# Patient Record
Sex: Female | Born: 1975 | Hispanic: Yes | Marital: Married | Smoking: Current some day smoker
Health system: Southern US, Community
[De-identification: ages and names within clinical notes are randomized; demographics above are authoritative.]

## PROBLEM LIST (undated history)

## (undated) DIAGNOSIS — T8859XA Other complications of anesthesia, initial encounter: Secondary | ICD-10-CM

## (undated) DIAGNOSIS — M5416 Radiculopathy, lumbar region: Secondary | ICD-10-CM

## (undated) DIAGNOSIS — D649 Anemia, unspecified: Secondary | ICD-10-CM

## (undated) DIAGNOSIS — J189 Pneumonia, unspecified organism: Secondary | ICD-10-CM

## (undated) DIAGNOSIS — K219 Gastro-esophageal reflux disease without esophagitis: Secondary | ICD-10-CM

## (undated) DIAGNOSIS — G4733 Obstructive sleep apnea (adult) (pediatric): Secondary | ICD-10-CM

## (undated) DIAGNOSIS — F419 Anxiety disorder, unspecified: Secondary | ICD-10-CM

## (undated) DIAGNOSIS — R079 Chest pain, unspecified: Secondary | ICD-10-CM

## (undated) DIAGNOSIS — K449 Diaphragmatic hernia without obstruction or gangrene: Secondary | ICD-10-CM

## (undated) DIAGNOSIS — M961 Postlaminectomy syndrome, not elsewhere classified: Secondary | ICD-10-CM

## (undated) DIAGNOSIS — M797 Fibromyalgia: Secondary | ICD-10-CM

## (undated) DIAGNOSIS — I209 Angina pectoris, unspecified: Secondary | ICD-10-CM

## (undated) DIAGNOSIS — D171 Benign lipomatous neoplasm of skin and subcutaneous tissue of trunk: Secondary | ICD-10-CM

## (undated) DIAGNOSIS — G894 Chronic pain syndrome: Secondary | ICD-10-CM

## (undated) DIAGNOSIS — J45909 Unspecified asthma, uncomplicated: Secondary | ICD-10-CM

## (undated) DIAGNOSIS — M1990 Unspecified osteoarthritis, unspecified site: Secondary | ICD-10-CM

## (undated) DIAGNOSIS — M5126 Other intervertebral disc displacement, lumbar region: Secondary | ICD-10-CM

## (undated) DIAGNOSIS — E669 Obesity, unspecified: Secondary | ICD-10-CM

## (undated) HISTORY — PX: ABDOMINAL HYSTERECTOMY: SHX81

## (undated) HISTORY — DX: Fibromyalgia: M79.7

## (undated) HISTORY — PX: CHOLECYSTECTOMY: SHX55

## (undated) HISTORY — PX: APPENDECTOMY: SHX54

## (undated) HISTORY — PX: ORIF FINGER / THUMB FRACTURE: SUR932

## (undated) HISTORY — PX: ANKLE FRACTURE SURGERY: SHX122

## (undated) HISTORY — PX: EAR TUBE REMOVAL: SHX1486

## (undated) HISTORY — PX: BACK SURGERY: SHX140

---

## 2003-04-28 DIAGNOSIS — Z9884 Bariatric surgery status: Secondary | ICD-10-CM

## 2003-04-28 HISTORY — DX: Bariatric surgery status: Z98.84

## 2004-04-27 DIAGNOSIS — Z9884 Bariatric surgery status: Secondary | ICD-10-CM

## 2004-04-27 HISTORY — DX: Bariatric surgery status: Z98.84

## 2004-04-27 HISTORY — PX: GASTRIC BYPASS: SHX52

## 2016-06-17 DIAGNOSIS — R51 Headache: Secondary | ICD-10-CM | POA: Diagnosis not present

## 2016-06-17 DIAGNOSIS — M797 Fibromyalgia: Secondary | ICD-10-CM | POA: Diagnosis not present

## 2016-07-03 ENCOUNTER — Ambulatory Visit (INDEPENDENT_AMBULATORY_CARE_PROVIDER_SITE_OTHER): Payer: BLUE CROSS/BLUE SHIELD | Admitting: Family Medicine

## 2016-07-03 ENCOUNTER — Encounter (INDEPENDENT_AMBULATORY_CARE_PROVIDER_SITE_OTHER): Payer: Self-pay

## 2016-07-03 ENCOUNTER — Encounter: Payer: Self-pay | Admitting: Family Medicine

## 2016-07-03 VITALS — BP 120/70 | HR 82 | Ht 66.0 in | Wt 218.0 lb

## 2016-07-03 DIAGNOSIS — R5383 Other fatigue: Secondary | ICD-10-CM

## 2016-07-03 DIAGNOSIS — R5381 Other malaise: Secondary | ICD-10-CM

## 2016-07-03 DIAGNOSIS — Z7689 Persons encountering health services in other specified circumstances: Secondary | ICD-10-CM

## 2016-07-03 DIAGNOSIS — M797 Fibromyalgia: Secondary | ICD-10-CM | POA: Diagnosis not present

## 2016-07-03 MED ORDER — DULOXETINE HCL 60 MG PO CPEP: 60.0000 mg | ORAL_CAPSULE | Freq: Every day | ORAL | 3 refills | Status: DC

## 2016-07-03 NOTE — Progress Notes (Signed)
Name: Margaret Cuevas   MRN: [id number]    DOB: [birth year]   Date:07/03/2016       Progress Note  Subjective  Chief Complaint  Chief Complaint  Patient presents with  . Establish Care  . Fibromyalgia    takes Duloxetine 30mg  for this- wants the dose increased- has never seen a specialist for fibromyalgia  . Fatigue    "having to take "an energy shot daily to get through the day"    Patient presents for establishment for medical care.   Muscle Pain  This is a chronic problem. The current episode started more than 1 year ago. The problem occurs daily. The problem has been gradually worsening since onset. The pain occurs in the context of recent emotional stress ("whole lifestyle change"). Pain location: multiple trigger points. The pain is medium. Associated symptoms include headaches. Pertinent negatives include no abdominal pain, chest pain, constipation, diarrhea, dysuria, eye pain, fatigue, fever, nausea, rash, stiffness, sensory change, shortness of breath, swollen glands, urinary symptoms, visual change, vomiting or wheezing. The treatment provided moderate relief.  Thyroid Problem  Presents for follow-up visit. Symptoms include palpitations and weight gain. Patient reports no anxiety, cold intolerance, constipation, depressed mood, diaphoresis, diarrhea, dry skin, fatigue, hair loss, heat intolerance, hoarse voice, leg swelling, menstrual problem, nail problem, tremors, visual change or weight loss. The symptoms have been worsening.    No problem-specific Assessment & Plan notes found for this encounter.   Past Medical History:  Diagnosis Date  . Fibromyalgia     Past Surgical History:  Procedure Laterality Date  . ABDOMINAL HYSTERECTOMY    . ANKLE FRACTURE SURGERY     plates and screws placed on both sides  . BACK SURGERY    . CESAREAN SECTION    . CHOLECYSTECTOMY    . EAR TUBE REMOVAL    . GASTRIC BYPASS  2006  . ORIF FINGER / THUMB FRACTURE     screws in thumb     Family History  Problem Relation Age of Onset  . Diabetes Mother   . Cancer Maternal Grandmother   . Diabetes Maternal Grandmother   . Hypertension Maternal Grandmother   . Cancer Paternal Grandfather   . Hypertension Father   . Hypertension Paternal Grandmother     Social History   Social History  . Marital status: Single    Spouse name: N/A  . Number of children: N/A  . Years of education: N/A   Occupational History  . Not on file.   Social History Main Topics  . Smoking status: Current Some Day Smoker    Types: Cigarettes  . Smokeless tobacco: Never Used  . Alcohol use Yes  . Drug use: No  . Sexual activity: Yes   Other Topics Concern  . Not on file   Social History Narrative  . No narrative on file    Allergies  Allergen Reactions  . Ibuprofen Other (See Comments)    Other Reaction: Other reaction-swells    No outpatient prescriptions prior to visit.   No facility-administered medications prior to visit.     Review of Systems  Constitutional: Positive for malaise/fatigue and weight gain. Negative for chills, diaphoresis, fatigue, fever and weight loss.  HENT: Negative for ear discharge, ear pain, hoarse voice and sore throat.   Eyes: Negative for blurred vision and pain.  Respiratory: Negative for cough, sputum production, shortness of breath and wheezing.   Cardiovascular: Positive for palpitations. Negative for chest pain and leg swelling.  Gastrointestinal: Negative  for abdominal pain, blood in stool, constipation, diarrhea, heartburn, melena, nausea and vomiting.  Genitourinary: Negative for dysuria, frequency, hematuria, menstrual problem and urgency.  Musculoskeletal: Positive for back pain, joint pain, myalgias and neck pain. Negative for stiffness.  Skin: Negative for itching and rash.  Neurological: Positive for headaches. Negative for dizziness, tingling, tremors, sensory change and focal weakness.  Endo/Heme/Allergies: Negative for  environmental allergies, cold intolerance, heat intolerance and polydipsia. Does not bruise/bleed easily.  Psychiatric/Behavioral: Negative for depression and suicidal ideas. The patient is not nervous/anxious and does not have insomnia.      Objective  Vitals:   07/03/16 1457  BP: 120/70  Pulse: 82  Weight: 218 lb (98.9 kg)  Height: 5\' 6"  (1.676 m)    Physical Exam  Constitutional: She is well-developed, well-nourished, and in no distress. No distress.  HENT:  Head: Normocephalic and atraumatic.  Right Ear: External ear normal.  Left Ear: External ear normal.  Nose: Nose normal.  Mouth/Throat: Oropharynx is clear and moist.  Eyes: Conjunctivae and EOM are normal. Pupils are equal, round, and reactive to light. Right eye exhibits no discharge. Left eye exhibits no discharge.  Neck: Normal range of motion. Neck supple. No JVD present. No thyromegaly present.  Cardiovascular: Normal rate, regular rhythm, normal heart sounds and intact distal pulses.  Exam reveals no gallop and no friction rub.   No murmur heard. Pulmonary/Chest: Effort normal and breath sounds normal. She has no wheezes. She has no rales.  Abdominal: Soft. Bowel sounds are normal. She exhibits no mass. There is no tenderness. There is no guarding.  Musculoskeletal: Normal range of motion. She exhibits no edema.  Lymphadenopathy:    She has no cervical adenopathy.  Neurological: She is alert. She has normal reflexes.  Skin: Skin is warm and dry. She is not diaphoretic.  Psychiatric: Mood and affect normal.  Nursing note and vitals reviewed.     Assessment & Plan  Problem List Items Addressed This Visit    None    Visit Diagnoses    Encounter to establish care    -  Primary   Malaise and fatigue       Relevant Orders   TSH   CBC with Differential/Platelet   Renal Function Panel   Sedimentation rate   Fibromyalgia       Relevant Medications   DULoxetine (CYMBALTA) 60 MG capsule      Meds ordered  this encounter  Medications  . DULoxetine (CYMBALTA) 60 MG capsule    Sig: Take 1 capsule (60 mg total) by mouth daily.    Dispense:  30 capsule    Refill:  3      Dr. Otilio Miu [facility]  07/03/16

## 2016-07-03 NOTE — Patient Instructions (Signed)
Myofascial Pain Syndrome and Fibromyalgia Myofascial pain syndrome and fibromyalgia are both pain disorders. This pain may be felt mainly in your muscles.  Myofascial pain syndrome:  Always has trigger points or tender points in the muscle that will cause pain when pressed. The pain may come and go.  Usually affects your neck, upper back, and shoulder areas. The pain often radiates into your arms and hands.  Fibromyalgia:  Has muscle pains and tenderness that come and go.  Is often associated with fatigue and sleep disturbances.  Has trigger points.  Tends to be long-lasting (chronic), but is not life-threatening. Fibromyalgia and myofascial pain are not the same. However, they often occur together. If you have both conditions, each can make the other worse. Both are common and can cause enough pain and fatigue to make day-to-day activities difficult. What are the causes? The exact causes of fibromyalgia and myofascial pain are not known. People with certain gene types may be more likely to develop fibromyalgia. Some factors can be triggers for both conditions, such as:  Spine disorders.  Arthritis.  Severe injury (trauma) and other physical stressors.  Being under a lot of stress.  A medical illness. What are the signs or symptoms? Fibromyalgia  The main symptom of fibromyalgia is widespread pain and tenderness in your muscles. This can vary over time. Pain is sometimes described as stabbing, shooting, or burning. You may have tingling or numbness, too. You may also have sleep problems and fatigue. You may wake up feeling tired and groggy (fibro fog). Other symptoms may include:  Bowel and bladder problems.  Headaches.  Visual problems.  Problems with odors and noises.  Depression or mood changes.  Painful menstrual periods (dysmenorrhea).  Dry skin or eyes. Myofascial pain syndrome  Symptoms of myofascial pain syndrome include:  Tight, ropy bands of  muscle.  Uncomfortable sensations in muscular areas, such as:  Aching.  Cramping.  Burning.  Numbness.  Tingling.  Muscle weakness.  Trouble moving certain muscles freely (range of motion). How is this diagnosed? There are no specific tests to diagnose fibromyalgia or myofascial pain syndrome. Both can be hard to diagnose because their symptoms are common in many other conditions. Your health care provider may suspect one or both of these conditions based on your symptoms and medical history. Your health care provider will also do a physical exam. The key to diagnosing fibromyalgia is having pain, fatigue, and other symptoms for more than three months that cannot be explained by another condition. The key to diagnosing myofascial pain syndrome is finding trigger points in muscles that are tender and cause pain elsewhere in your body (referred pain). How is this treated? Treating fibromyalgia and myofascial pain often requires a team of health care providers. This usually starts with your primary provider and a physical therapist. You may also find it helpful to work with alternative health care providers, such as massage therapists or acupuncturists. Treatment for fibromyalgia may include medicines. This may include nonsteroidal anti-inflammatory drugs (NSAIDs), along with other medicines. Treatment for myofascial pain may also include:  NSAIDs.  Cooling and stretching of muscles.  Trigger point injections.  Sound wave (ultrasound) treatments to stimulate muscles. Follow these instructions at home:  Take medicines only as directed by your health care provider.  Exercise as directed by your health care provider or physical therapist.  Try to avoid stressful situations.  Practice relaxation techniques to control your stress. You may want to try:  Biofeedback.  Visual imagery.  Hypnosis.    Muscle relaxation.  Yoga.  Meditation.  Talk to your health care provider  about alternative treatments, such as acupuncture or massage treatment.  Maintain a healthy lifestyle. This includes eating a healthy diet and getting enough sleep.  Consider joining a support group.  Do not do activities that stress or strain your muscles. That includes repetitive motions and heavy lifting. Where to find more information:  National Fibromyalgia Association: www.fmaware.org  Arthritis Foundation: www.arthritis.org  American Chronic Pain Association: www.theacpa.org/condition/myofascial-pain Contact a health care provider if:  You have new symptoms.  Your symptoms get worse.  You have side effects from your medicines.  You have trouble sleeping.  Your condition is causing depression or anxiety. This information is not intended to replace advice given to you by your health care provider. Make sure you discuss any questions you have with your health care provider. Document Released: 04/13/2005 Document Revised: 09/19/2015 Document Reviewed: 01/17/2014 Elsevier Interactive Patient Education  2017 Elsevier Inc.  

## 2016-07-04 LAB — CBC WITH DIFFERENTIAL/PLATELET
BASOS: 0 %
Basophils Absolute: 0 10*3/uL (ref 0.0–0.2)
EOS (ABSOLUTE): 0.2 10*3/uL (ref 0.0–0.4)
Eos: 3 %
HEMOGLOBIN: 12 g/dL (ref 11.1–15.9)
Hematocrit: 36.1 % (ref 34.0–46.6)
IMMATURE GRANS (ABS): 0 10*3/uL (ref 0.0–0.1)
Immature Granulocytes: 0 %
LYMPHS ABS: 2.6 10*3/uL (ref 0.7–3.1)
LYMPHS: 41 %
MCH: 28.9 pg (ref 26.6–33.0)
MCHC: 33.2 g/dL (ref 31.5–35.7)
MCV: 87 fL (ref 79–97)
MONOCYTES: 7 %
Monocytes Absolute: 0.5 10*3/uL (ref 0.1–0.9)
NEUTROS ABS: 3 10*3/uL (ref 1.4–7.0)
Neutrophils: 49 %
Platelets: 270 10*3/uL (ref 150–379)
RBC: 4.15 x10E6/uL (ref 3.77–5.28)
RDW: 17.6 % — ABNORMAL HIGH (ref 12.3–15.4)
WBC: 6.2 10*3/uL (ref 3.4–10.8)

## 2016-07-04 LAB — RENAL FUNCTION PANEL
Albumin: 4.3 g/dL (ref 3.5–5.5)
BUN/Creatinine Ratio: 22 (ref 9–23)
BUN: 18 mg/dL (ref 6–20)
CO2: 25 mmol/L (ref 18–29)
CREATININE: 0.83 mg/dL (ref 0.57–1.00)
Calcium: 9.4 mg/dL (ref 8.7–10.2)
Chloride: 102 mmol/L (ref 96–106)
GFR, EST AFRICAN AMERICAN: 103 mL/min/{1.73_m2} (ref >59)
GFR, EST NON AFRICAN AMERICAN: 90 mL/min/{1.73_m2} (ref >59)
Glucose: 91 mg/dL (ref 65–99)
PHOSPHORUS: 4.3 mg/dL (ref 2.5–4.5)
Potassium: 4.8 mmol/L (ref 3.5–5.2)
Sodium: 142 mmol/L (ref 134–144)

## 2016-07-04 LAB — TSH: TSH: 1.48 u[IU]/mL (ref 0.450–4.500)

## 2016-07-04 LAB — SEDIMENTATION RATE: Sed Rate: 5 mm/hr (ref 0–32)

## 2016-09-11 ENCOUNTER — Other Ambulatory Visit: Payer: Self-pay

## 2016-11-17 ENCOUNTER — Other Ambulatory Visit: Payer: Self-pay

## 2016-11-17 DIAGNOSIS — M797 Fibromyalgia: Secondary | ICD-10-CM

## 2016-11-17 MED ORDER — DULOXETINE HCL 60 MG PO CPEP: 60.0000 mg | ORAL_CAPSULE | Freq: Every day | ORAL | 0 refills | Status: DC

## 2016-12-01 ENCOUNTER — Encounter: Payer: Self-pay | Admitting: Family Medicine

## 2016-12-01 ENCOUNTER — Ambulatory Visit (INDEPENDENT_AMBULATORY_CARE_PROVIDER_SITE_OTHER): Payer: BLUE CROSS/BLUE SHIELD | Admitting: Family Medicine

## 2016-12-01 VITALS — BP 100/64 | HR 64 | Ht 66.0 in | Wt 221.0 lb

## 2016-12-01 DIAGNOSIS — M7712 Lateral epicondylitis, left elbow: Secondary | ICD-10-CM | POA: Diagnosis not present

## 2016-12-01 DIAGNOSIS — E663 Overweight: Secondary | ICD-10-CM

## 2016-12-01 DIAGNOSIS — E8941 Symptomatic postprocedural ovarian failure: Secondary | ICD-10-CM | POA: Diagnosis not present

## 2016-12-01 DIAGNOSIS — M797 Fibromyalgia: Secondary | ICD-10-CM | POA: Diagnosis not present

## 2016-12-01 MED ORDER — DULOXETINE HCL 60 MG PO CPEP: 60.0000 mg | ORAL_CAPSULE | Freq: Every day | ORAL | 0 refills | Status: DC

## 2016-12-01 MED ORDER — GABAPENTIN 100 MG PO CAPS: 100.0000 mg | ORAL_CAPSULE | Freq: Three times a day (TID) | ORAL | 3 refills | Status: DC

## 2016-12-01 MED ORDER — ESTRADIOL 0.0375 MG/24HR TD PTWK: 0.0375 mg | MEDICATED_PATCH | TRANSDERMAL | 12 refills | Status: DC

## 2016-12-01 NOTE — Progress Notes (Signed)
Name: Margaret Cuevas   MRN: [id number]    DOB: [birth year]   Date:12/01/2016       Progress Note  Subjective  Chief Complaint  Chief Complaint  Patient presents with  . Follow-up    increased med last visit  . Elbow Pain    L) elbow pain    Muscle Pain  This is a chronic problem. The current episode started more than 1 year ago. The problem occurs constantly. The problem has been waxing and waning since onset. The context of the pain is unknown. Pain location: generalized. The pain is mild. Nothing aggravates the symptoms. Associated symptoms include fatigue and stiffness. Pertinent negatives include no abdominal pain, chest pain, constipation, diarrhea, dysuria, eye pain, fever, headaches, joint swelling, nausea, rash, sensory change, shortness of breath, swollen glands, urinary symptoms, vaginal discharge, visual change, vomiting, weakness or wheezing. The treatment provided mild relief. There is no swelling present.    No problem-specific Assessment & Plan notes found for this encounter.   Past Medical History:  Diagnosis Date  . Fibromyalgia     Past Surgical History:  Procedure Laterality Date  . ABDOMINAL HYSTERECTOMY    . ANKLE FRACTURE SURGERY     plates and screws placed on both sides  . BACK SURGERY    . CESAREAN SECTION    . CHOLECYSTECTOMY    . EAR TUBE REMOVAL    . GASTRIC BYPASS  2006  . ORIF FINGER / THUMB FRACTURE     screws in thumb    Family History  Problem Relation Age of Onset  . Diabetes Mother   . Cancer Maternal Grandmother   . Diabetes Maternal Grandmother   . Hypertension Maternal Grandmother   . Cancer Paternal Grandfather   . Hypertension Father   . Hypertension Paternal Grandmother     Social History   Social History  . Marital status: Single    Spouse name: N/A  . Number of children: N/A  . Years of education: N/A   Occupational History  . Not on file.   Social History Main Topics  . Smoking status: Current Some Day Smoker   Types: Cigarettes  . Smokeless tobacco: Never Used  . Alcohol use Yes  . Drug use: No  . Sexual activity: Yes   Other Topics Concern  . Not on file   Social History Narrative  . No narrative on file    Allergies  Allergen Reactions  . Ibuprofen Other (See Comments)    Other Reaction: Other reaction-swells    Outpatient Medications Prior to Visit  Medication Sig Dispense Refill  . DULoxetine (CYMBALTA) 60 MG capsule Take 1 capsule (60 mg total) by mouth daily. 30 capsule 0  . BLACK COHOSH PO Take 1 capsule by mouth daily.     No facility-administered medications prior to visit.     Review of Systems  Constitutional: Positive for fatigue. Negative for chills, fever, malaise/fatigue and weight loss.  HENT: Negative for ear discharge, ear pain and sore throat.   Eyes: Negative for blurred vision and pain.  Respiratory: Negative for cough, sputum production, shortness of breath and wheezing.   Cardiovascular: Negative for chest pain, palpitations and leg swelling.  Gastrointestinal: Negative for abdominal pain, blood in stool, constipation, diarrhea, heartburn, melena, nausea and vomiting.  Genitourinary: Negative for dysuria, frequency, hematuria, urgency and vaginal discharge.  Musculoskeletal: Positive for stiffness. Negative for back pain, joint pain, joint swelling, myalgias and neck pain.  Skin: Negative for rash.  Neurological: Negative for  dizziness, tingling, sensory change, focal weakness, weakness and headaches.  Endo/Heme/Allergies: Negative for environmental allergies and polydipsia. Does not bruise/bleed easily.  Psychiatric/Behavioral: Negative for depression and suicidal ideas. The patient is not nervous/anxious and does not have insomnia.      Objective  Vitals:   12/01/16 0841  BP: 100/64  Pulse: 64  Weight: 221 lb (100.2 kg)  Height: 5\' 6"  (1.676 m)    Physical Exam  Constitutional: She is well-developed, well-nourished, and in no distress. No  distress.  HENT:  Head: Normocephalic and atraumatic.  Right Ear: External ear normal.  Left Ear: External ear normal.  Nose: Nose normal.  Mouth/Throat: Oropharynx is clear and moist.  Eyes: Pupils are equal, round, and reactive to light. Conjunctivae and EOM are normal. Right eye exhibits no discharge. Left eye exhibits no discharge.  Neck: Normal range of motion. Neck supple. No JVD present. No thyromegaly present.  Cardiovascular: Normal rate, regular rhythm, normal heart sounds and intact distal pulses.  Exam reveals no gallop and no friction rub.   No murmur heard. Pulmonary/Chest: Effort normal and breath sounds normal. She has no wheezes. She has no rales.  Abdominal: Soft. Bowel sounds are normal. She exhibits no mass. There is no tenderness. There is no guarding.  Musculoskeletal: Normal range of motion. She exhibits no edema.       Left elbow: Tenderness found.  Lymphadenopathy:    She has no cervical adenopathy.  Neurological: She is alert. She has normal reflexes.  Skin: Skin is warm and dry. She is not diaphoretic.  Psychiatric: Mood and affect normal.  Nursing note and vitals reviewed.     Assessment & Plan  Problem List Items Addressed This Visit    None    Visit Diagnoses    Fibromyalgia    -  Primary   Relevant Medications   gabapentin (NEURONTIN) 100 MG capsule   DULoxetine (CYMBALTA) 60 MG capsule   Hot flashes due to surgical menopause       Relevant Medications   estradiol (CLIMARA - DOSED IN MG/24 HR) 0.0375 mg/24hr patch   Overweight       Epicondylitis, lateral, left       Relevant Orders   Ambulatory referral to Orthopedic Surgery      Meds ordered this encounter  Medications  . gabapentin (NEURONTIN) 100 MG capsule    Sig: Take 1 capsule (100 mg total) by mouth 3 (three) times daily.    Dispense:  90 capsule    Refill:  3  . estradiol (CLIMARA - DOSED IN MG/24 HR) 0.0375 mg/24hr patch    Sig: Place 1 patch (0.0375 mg total) onto the skin  once a week.    Dispense:  4 patch    Refill:  12  . DULoxetine (CYMBALTA) 60 MG capsule    Sig: Take 1 capsule (60 mg total) by mouth daily.    Dispense:  30 capsule    Refill:  0      Dr. Otilio Miu [facility]  12/01/16

## 2016-12-08 DIAGNOSIS — M7712 Lateral epicondylitis, left elbow: Secondary | ICD-10-CM | POA: Diagnosis not present

## 2016-12-08 DIAGNOSIS — M7702 Medial epicondylitis, left elbow: Secondary | ICD-10-CM | POA: Diagnosis not present

## 2017-01-18 ENCOUNTER — Telehealth: Payer: Self-pay | Admitting: Family Medicine

## 2017-01-18 NOTE — Telephone Encounter (Signed)
States she needs a stronger dose patch, having hot flashes and night sweats. Please Advise.

## 2017-01-20 ENCOUNTER — Encounter: Payer: Self-pay | Admitting: Family Medicine

## 2017-01-20 ENCOUNTER — Ambulatory Visit (INDEPENDENT_AMBULATORY_CARE_PROVIDER_SITE_OTHER): Payer: BLUE CROSS/BLUE SHIELD | Admitting: Family Medicine

## 2017-01-20 VITALS — BP 120/88 | HR 72 | Ht 66.0 in | Wt 230.0 lb

## 2017-01-20 DIAGNOSIS — M542 Cervicalgia: Secondary | ICD-10-CM | POA: Diagnosis not present

## 2017-01-20 DIAGNOSIS — G8929 Other chronic pain: Secondary | ICD-10-CM | POA: Diagnosis not present

## 2017-01-20 DIAGNOSIS — N951 Menopausal and female climacteric states: Secondary | ICD-10-CM

## 2017-01-20 DIAGNOSIS — M797 Fibromyalgia: Secondary | ICD-10-CM

## 2017-01-20 DIAGNOSIS — Z23 Encounter for immunization: Secondary | ICD-10-CM | POA: Diagnosis not present

## 2017-01-20 MED ORDER — DULOXETINE HCL 60 MG PO CPEP: 60.0000 mg | ORAL_CAPSULE | Freq: Every day | ORAL | 0 refills | Status: DC

## 2017-01-20 MED ORDER — ESTRADIOL 0.05 MG/24HR TD PTTW: 1.0000 | MEDICATED_PATCH | TRANSDERMAL | 12 refills | Status: DC

## 2017-01-20 MED ORDER — GABAPENTIN 100 MG PO CAPS: 100.0000 mg | ORAL_CAPSULE | Freq: Three times a day (TID) | ORAL | 3 refills | Status: DC

## 2017-01-20 NOTE — Progress Notes (Signed)
Name: Margaret Cuevas   MRN: [id number]    DOB: Jul 02, 1975   Date:01/20/2017       Progress Note  Subjective  Chief Complaint  Chief Complaint  Patient presents with  . hormone replacement therapy    use to use the vivelle patches- wants to switch to that  . Neck Pain    send to [facility] for further eval for neck pain    Patient on hormone replacement for vasomotor sx of hypoestrogen.   Neck Pain   This is a chronic problem. The current episode started more than 1 year ago. The problem occurs daily. The problem has been waxing and waning. The pain is associated with nothing. Pain location: posterior/bilateral. The quality of the pain is described as aching and shooting. The pain is at a severity of 7/10. The pain is moderate. Nothing aggravates the symptoms. The pain is worse during the night. Associated symptoms include headaches. Pertinent negatives include no chest pain, fever, leg pain, numbness, pain with swallowing, paresis, tingling, weakness or weight loss. Treatments tried: duloxetine. The treatment provided mild relief.    No problem-specific Assessment & Plan notes found for this encounter.   Past Medical History:  Diagnosis Date  . Fibromyalgia     Past Surgical History:  Procedure Laterality Date  . ABDOMINAL HYSTERECTOMY    . ANKLE FRACTURE SURGERY     plates and screws placed on both sides  . BACK SURGERY    . CESAREAN SECTION    . CHOLECYSTECTOMY    . EAR TUBE REMOVAL    . GASTRIC BYPASS  2006  . ORIF FINGER / THUMB FRACTURE     screws in thumb    Family History  Problem Relation Age of Onset  . Diabetes Mother   . Cancer Maternal Grandmother   . Diabetes Maternal Grandmother   . Hypertension Maternal Grandmother   . Cancer Paternal Grandfather   . Hypertension Father   . Hypertension Paternal Grandmother     Social History   Social History  . Marital status: Single    Spouse name: N/A  . Number of children: N/A  . Years of education: N/A    Occupational History  . Not on file.   Social History Main Topics  . Smoking status: Current Some Day Smoker    Types: Cigarettes  . Smokeless tobacco: Never Used  . Alcohol use Yes  . Drug use: No  . Sexual activity: Yes   Other Topics Concern  . Not on file   Social History Narrative  . No narrative on file    Allergies  Allergen Reactions  . Ibuprofen Other (See Comments)    Other Reaction: Other reaction-swells    Outpatient Medications Prior to Visit  Medication Sig Dispense Refill  . BLACK COHOSH PO Take 1 capsule by mouth daily.    . DULoxetine (CYMBALTA) 60 MG capsule Take 1 capsule (60 mg total) by mouth daily. 30 capsule 0  . estradiol (CLIMARA - DOSED IN MG/24 HR) 0.0375 mg/24hr patch Place 1 patch (0.0375 mg total) onto the skin once a week. 4 patch 12  . gabapentin (NEURONTIN) 100 MG capsule Take 1 capsule (100 mg total) by mouth 3 (three) times daily. (Patient taking differently: Take 100 mg by mouth 2 (two) times daily. ) 90 capsule 3   No facility-administered medications prior to visit.     Review of Systems  Constitutional: Negative for chills, fever, malaise/fatigue and weight loss.  HENT: Negative for ear discharge,  ear pain and sore throat.   Eyes: Negative for blurred vision.  Respiratory: Negative for cough, sputum production, shortness of breath and wheezing.   Cardiovascular: Negative for chest pain, palpitations and leg swelling.  Gastrointestinal: Negative for abdominal pain, blood in stool, constipation, diarrhea, heartburn, melena and nausea.  Genitourinary: Negative for dysuria, frequency, hematuria and urgency.  Musculoskeletal: Positive for neck pain. Negative for back pain, joint pain and myalgias.  Skin: Negative for rash.  Neurological: Positive for headaches. Negative for dizziness, tingling, sensory change, focal weakness, weakness and numbness.  Endo/Heme/Allergies: Negative for environmental allergies and polydipsia. Does not  bruise/bleed easily.  Psychiatric/Behavioral: Negative for depression and suicidal ideas. The patient is not nervous/anxious and does not have insomnia.      Objective  Vitals:   01/20/17 0842  BP: 120/88  Pulse: 72  Weight: 230 lb (104.3 kg)  Height: 5\' 6"  (1.676 m)    Physical Exam  Constitutional: She is well-developed, well-nourished, and in no distress. No distress.  HENT:  Head: Normocephalic and atraumatic.  Right Ear: External ear normal.  Left Ear: External ear normal.  Nose: Nose normal.  Mouth/Throat: Oropharynx is clear and moist.  Eyes: Pupils are equal, round, and reactive to light. Conjunctivae and EOM are normal. Right eye exhibits no discharge. Left eye exhibits no discharge.  Neck: Normal range of motion. Neck supple. No JVD present. No thyromegaly present.  Cardiovascular: Normal rate, regular rhythm, normal heart sounds and intact distal pulses.  Exam reveals no gallop and no friction rub.   No murmur heard. Pulmonary/Chest: Effort normal and breath sounds normal. She has no wheezes. She has no rales.  Abdominal: Soft. Bowel sounds are normal. She exhibits no mass. There is no tenderness. There is no guarding.  Musculoskeletal: Normal range of motion. She exhibits no edema.       Cervical back: She exhibits tenderness and spasm.  Lymphadenopathy:    She has no cervical adenopathy.  Neurological: She is alert.  Skin: Skin is warm and dry. She is not diaphoretic.  Psychiatric: Mood and affect normal.  Nursing note and vitals reviewed.     Assessment & Plan  Problem List Items Addressed This Visit      Other   Influenza vaccine needed   Relevant Orders   Flu Vaccine QUAD 6+ mos PF IM (Fluarix Quad PF) (Completed)    Other Visit Diagnoses    Neck pain of over 3 months duration    -  Primary   Relevant Orders   Ambulatory referral to Orthopedic Surgery   Vasomotor symptoms due to menopause       Relevant Medications   estradiol (VIVELLE-DOT) 0.05  MG/24HR patch (Start on 01/21/2017)   Fibromyalgia       Relevant Medications   gabapentin (NEURONTIN) 100 MG capsule   DULoxetine (CYMBALTA) 60 MG capsule      Meds ordered this encounter  Medications  . gabapentin (NEURONTIN) 100 MG capsule    Sig: Take 1 capsule (100 mg total) by mouth 3 (three) times daily.    Dispense:  90 capsule    Refill:  3  . DULoxetine (CYMBALTA) 60 MG capsule    Sig: Take 1 capsule (60 mg total) by mouth daily.    Dispense:  30 capsule    Refill:  0  . estradiol (VIVELLE-DOT) 0.05 MG/24HR patch    Sig: Place 1 patch (0.05 mg total) onto the skin 2 (two) times a week.    Dispense:  8  patch    Refill:  12      Dr. [facility]  01/20/17

## 2017-01-20 NOTE — Patient Instructions (Signed)
Steps to Quit Smoking Smoking tobacco can be harmful to your health and can affect almost every organ in your body. Smoking puts you, and those around you, at risk for developing many serious chronic diseases. Quitting smoking is difficult, but it is one of the best things that you can do for your health. It is never too late to quit. What are the benefits of quitting smoking? When you quit smoking, you lower your risk of developing serious diseases and conditions, such as:  Lung cancer or lung disease, such as COPD.  Heart disease.  Stroke.  Heart attack.  Infertility.  Osteoporosis and bone fractures.  Additionally, symptoms such as coughing, wheezing, and shortness of breath may get better when you quit. You may also find that you get sick less often because your body is stronger at fighting off colds and infections. If you are pregnant, quitting smoking can help to reduce your chances of having a baby of low birth weight. How do I get ready to quit? When you decide to quit smoking, create a plan to make sure that you are successful. Before you quit:  Pick a date to quit. Set a date within the next two weeks to give you time to prepare.  Write down the reasons why you are quitting. Keep this list in places where you will see it often, such as on your bathroom mirror or in your car or wallet.  Identify the people, places, things, and activities that make you want to smoke (triggers) and avoid them. Make sure to take these actions: ? Throw away all cigarettes at home, at work, and in your car. ? Throw away smoking accessories, such as ashtrays and lighters. ? Clean your car and make sure to empty the ashtray. ? Clean your home, including curtains and carpets.  Tell your family, friends, and coworkers that you are quitting. Support from your loved ones can make quitting easier.  Talk with your health care provider about your options for quitting smoking.  Find out what treatment  options are covered by your health insurance.  What strategies can I use to quit smoking? Talk with your healthcare provider about different strategies to quit smoking. Some strategies include:  Quitting smoking altogether instead of gradually lessening how much you smoke over a period of time. Research shows that quitting "cold turkey" is more successful than gradually quitting.  Attending in-person counseling to help you build problem-solving skills. You are more likely to have success in quitting if you attend several counseling sessions. Even short sessions of 10 minutes can be effective.  Finding resources and support systems that can help you to quit smoking and remain smoke-free after you quit. These resources are most helpful when you use them often. They can include: ? Online chats with a counselor. ? Telephone quitlines. ? Printed self-help materials. ? Support groups or group counseling. ? Text messaging programs. ? Mobile phone applications.  Taking medicines to help you quit smoking. (If you are pregnant or breastfeeding, talk with your health care provider first.) Some medicines contain nicotine and some do not. Both types of medicines help with cravings, but the medicines that include nicotine help to relieve withdrawal symptoms. Your health care provider may recommend: ? Nicotine patches, gum, or lozenges. ? Nicotine inhalers or sprays. ? Non-nicotine medicine that is taken by mouth.  Talk with your health care provider about combining strategies, such as taking medicines while you are also receiving in-person counseling. Using these two strategies together   makes you more likely to succeed in quitting than if you used either strategy on its own. If you are pregnant or breastfeeding, talk with your health care provider about finding counseling or other support strategies to quit smoking. Do not take medicine to help you quit smoking unless told to do so by your health care  provider. What things can I do to make it easier to quit? Quitting smoking might feel overwhelming at first, but there is a lot that you can do to make it easier. Take these important actions:  Reach out to your family and friends and ask that they support and encourage you during this time. Call telephone quitlines, reach out to support groups, or work with a counselor for support.  Ask people who smoke to avoid smoking around you.  Avoid places that trigger you to smoke, such as bars, parties, or smoke-break areas at work.  Spend time around people who do not smoke.  Lessen stress in your life, because stress can be a smoking trigger for some people. To lessen stress, try: ? Exercising regularly. ? Deep-breathing exercises. ? Yoga. ? Meditating. ? Performing a body scan. This involves closing your eyes, scanning your body from head to toe, and noticing which parts of your body are particularly tense. Purposefully relax the muscles in those areas.  Download or purchase mobile phone or tablet apps (applications) that can help you stick to your quit plan by providing reminders, tips, and encouragement. There are many free apps, such as QuitGuide from the CDC (Centers for Disease Control and Prevention). You can find other support for quitting smoking (smoking cessation) through smokefree.gov and other websites.  How will I feel when I quit smoking? Within the first 24 hours of quitting smoking, you may start to feel some withdrawal symptoms. These symptoms are usually most noticeable 2-3 days after quitting, but they usually do not last beyond 2-3 weeks. Changes or symptoms that you might experience include:  Mood swings.  Restlessness, anxiety, or irritation.  Difficulty concentrating.  Dizziness.  Strong cravings for sugary foods in addition to nicotine.  Mild weight gain.  Constipation.  Nausea.  Coughing or a sore throat.  Changes in how your medicines work in your  body.  A depressed mood.  Difficulty sleeping (insomnia).  After the first 2-3 weeks of quitting, you may start to notice more positive results, such as:  Improved sense of smell and taste.  Decreased coughing and sore throat.  Slower heart rate.  Lower blood pressure.  Clearer skin.  The ability to breathe more easily.  Fewer sick days.  Quitting smoking is very challenging for most people. Do not get discouraged if you are not successful the first time. Some people need to make many attempts to quit before they achieve long-term success. Do your best to stick to your quit plan, and talk with your health care provider if you have any questions or concerns. This information is not intended to replace advice given to you by your health care provider. Make sure you discuss any questions you have with your health care provider. Document Released: 04/07/2001 Document Revised: 12/10/2015 Document Reviewed: 08/28/2014 Elsevier Interactive Patient Education  2017 [employer]vier Inc.  

## 2017-01-25 ENCOUNTER — Other Ambulatory Visit: Payer: Self-pay

## 2017-01-25 DIAGNOSIS — N951 Menopausal and female climacteric states: Secondary | ICD-10-CM

## 2017-01-27 DIAGNOSIS — M5412 Radiculopathy, cervical region: Secondary | ICD-10-CM | POA: Diagnosis not present

## 2017-01-27 DIAGNOSIS — E669 Obesity, unspecified: Secondary | ICD-10-CM | POA: Diagnosis not present

## 2017-01-27 DIAGNOSIS — M542 Cervicalgia: Secondary | ICD-10-CM | POA: Diagnosis not present

## 2017-03-04 ENCOUNTER — Other Ambulatory Visit: Payer: Self-pay | Admitting: Family Medicine

## 2017-03-04 DIAGNOSIS — M797 Fibromyalgia: Secondary | ICD-10-CM

## 2017-04-15 ENCOUNTER — Other Ambulatory Visit: Payer: Self-pay | Admitting: Family Medicine

## 2017-04-15 DIAGNOSIS — M797 Fibromyalgia: Secondary | ICD-10-CM

## 2017-05-22 ENCOUNTER — Emergency Department: Payer: BLUE CROSS/BLUE SHIELD

## 2017-05-22 ENCOUNTER — Emergency Department
Admission: EM | Admit: 2017-05-22 | Discharge: 2017-05-22 | Disposition: A | Discharge status: Home / Self Care | Payer: BLUE CROSS/BLUE SHIELD | Attending: Emergency Medicine | Admitting: Emergency Medicine

## 2017-05-22 ENCOUNTER — Encounter: Payer: Self-pay | Admitting: Emergency Medicine

## 2017-05-22 DIAGNOSIS — M797 Fibromyalgia: Secondary | ICD-10-CM | POA: Diagnosis not present

## 2017-05-22 DIAGNOSIS — F10920 Alcohol use, unspecified with intoxication, uncomplicated: Secondary | ICD-10-CM | POA: Diagnosis not present

## 2017-05-22 DIAGNOSIS — R6883 Chills (without fever): Secondary | ICD-10-CM | POA: Diagnosis not present

## 2017-05-22 DIAGNOSIS — R4020 Unspecified coma: Secondary | ICD-10-CM | POA: Diagnosis not present

## 2017-05-22 DIAGNOSIS — W19XXXA Unspecified fall, initial encounter: Secondary | ICD-10-CM | POA: Diagnosis not present

## 2017-05-22 DIAGNOSIS — R55 Syncope and collapse: Secondary | ICD-10-CM | POA: Diagnosis not present

## 2017-05-22 DIAGNOSIS — I10 Essential (primary) hypertension: Secondary | ICD-10-CM | POA: Diagnosis not present

## 2017-05-22 DIAGNOSIS — S0990XA Unspecified injury of head, initial encounter: Secondary | ICD-10-CM | POA: Diagnosis not present

## 2017-05-22 DIAGNOSIS — F10120 Alcohol abuse with intoxication, uncomplicated: Secondary | ICD-10-CM | POA: Diagnosis not present

## 2017-05-22 DIAGNOSIS — S199XXA Unspecified injury of neck, initial encounter: Secondary | ICD-10-CM | POA: Diagnosis not present

## 2017-05-22 LAB — URINE DRUG SCREEN, QUALITATIVE (ARMC ONLY)
AMPHETAMINES, UR SCREEN: NOT DETECTED
Barbiturates, Ur Screen: NOT DETECTED
Benzodiazepine, Ur Scrn: NOT DETECTED
CANNABINOID 50 NG, UR ~~LOC~~: NOT DETECTED
COCAINE METABOLITE, UR ~~LOC~~: NOT DETECTED
MDMA (ECSTASY) UR SCREEN: NOT DETECTED
Methadone Scn, Ur: NOT DETECTED
Opiate, Ur Screen: NOT DETECTED
Phencyclidine (PCP) Ur S: NOT DETECTED
TRICYCLIC, UR SCREEN: NOT DETECTED

## 2017-05-22 LAB — URINALYSIS, COMPLETE (UACMP) WITH MICROSCOPIC
Bilirubin Urine: NEGATIVE
Glucose, UA: NEGATIVE mg/dL
Hgb urine dipstick: NEGATIVE
Ketones, ur: NEGATIVE mg/dL
LEUKOCYTES UA: NEGATIVE
NITRITE: NEGATIVE
Protein, ur: NEGATIVE mg/dL
SPECIFIC GRAVITY, URINE: 1.006 (ref 1.005–1.030)
pH: 5 (ref 5.0–8.0)

## 2017-05-22 LAB — COMPREHENSIVE METABOLIC PANEL
ALT: 16 U/L (ref 14–54)
AST: 23 U/L (ref 15–41)
Albumin: 4 g/dL (ref 3.5–5.0)
Alkaline Phosphatase: 50 U/L (ref 38–126)
Anion gap: 10 (ref 5–15)
BUN: 21 mg/dL — ABNORMAL HIGH (ref 6–20)
CHLORIDE: 107 mmol/L (ref 101–111)
CO2: 26 mmol/L (ref 22–32)
CREATININE: 1.03 mg/dL — AB (ref 0.44–1.00)
Calcium: 8.7 mg/dL — ABNORMAL LOW (ref 8.9–10.3)
GFR calc non Af Amer: >60 mL/min (ref >60)
Glucose, Bld: 105 mg/dL — ABNORMAL HIGH (ref 65–99)
Potassium: 4.1 mmol/L (ref 3.5–5.1)
SODIUM: 143 mmol/L (ref 135–145)
Total Bilirubin: 0.4 mg/dL (ref 0.3–1.2)
Total Protein: 7.4 g/dL (ref 6.5–8.1)

## 2017-05-22 LAB — CBC
HCT: 37 % (ref 35.0–47.0)
Hemoglobin: 12.2 g/dL (ref 12.0–16.0)
MCH: 29.8 pg (ref 26.0–34.0)
MCHC: 33.1 g/dL (ref 32.0–36.0)
MCV: 90.2 fL (ref 80.0–100.0)
PLATELETS: 250 10*3/uL (ref 150–440)
RBC: 4.1 MIL/uL (ref 3.80–5.20)
RDW: 15.5 % — ABNORMAL HIGH (ref 11.5–14.5)
WBC: 6.7 10*3/uL (ref 3.6–11.0)

## 2017-05-22 LAB — BLOOD GAS, VENOUS
ACID-BASE EXCESS: 0.4 mmol/L (ref 0.0–2.0)
BICARBONATE: 28.2 mmol/L — AB (ref 20.0–28.0)
PATIENT TEMPERATURE: 37
PCO2 VEN: 60 mmHg (ref 44.0–60.0)
PH VEN: 7.28 (ref 7.250–7.430)

## 2017-05-22 LAB — BRAIN NATRIURETIC PEPTIDE: B Natriuretic Peptide: 14 pg/mL (ref 0.0–100.0)

## 2017-05-22 LAB — ETHANOL: Alcohol, Ethyl (B): 289 mg/dL — ABNORMAL HIGH (ref <10)

## 2017-05-22 LAB — POCT PREGNANCY, URINE: Preg Test, Ur: NEGATIVE

## 2017-05-22 LAB — ACETAMINOPHEN LEVEL: Acetaminophen (Tylenol), Serum: <10 ug/mL — ABNORMAL LOW (ref 10–30)

## 2017-05-22 MED ORDER — AMMONIA AROMATIC IN INHA
0.3000 mL | Freq: Once | RESPIRATORY_TRACT | Status: AC
  Administered 2017-05-22: 0.3 mL via RESPIRATORY_TRACT

## 2017-05-22 MED ORDER — AMMONIA AROMATIC IN INHA
RESPIRATORY_TRACT | Status: AC
  Administered 2017-05-22: 0.3 mL via RESPIRATORY_TRACT
  Filled 2017-05-22: qty 10

## 2017-05-22 NOTE — ED Notes (Signed)
Pt to POV with family. Ambulatory with steady gait. Was taken to POV in wheelchair by son. VSS. NAD. Discharge instructions and follow up reviewed. All questions answered.

## 2017-05-22 NOTE — ED Provider Notes (Signed)
[facility] Emergency Department Provider Note   ____________________________________________   First MD Initiated Contact with Patient 05/22/17 0720     (approximate)  I have reviewed the triage vital signs and the nursing notes.   HISTORY  Chief Complaint Loss of Consciousness and Alcohol Intoxication history limited by loss of consciousness  HPI Margaret Cuevas is a 42 y.o. female Patient brought from home apparently had admitted to alcohol use and then passed out.husband now arrives and says she's had hysterectomy she has fibromyalgia and intact couple days ago and was feeling tired but was drinking. She drinks a lot. She had 3 or 4 shots of liquor and beer. He was in the bed she apparently was drinking and playing with the dog and fell her son heard her and found her. Husband got up she wasn't breathing well; to her mouth when she started coughing so rolled her over to the side. He did that twice.   History reviewed. No pertinent past medical history. per husband history is fibromyalgia and hypertension There are no active problems to display for this patient.   History reviewed. No pertinent surgical history. per historyfrom husband patient's had a hysterectomy Prior to Admission medications   Not on File    Allergies Patient has no allergy information on record.  History reviewed. No pertinent family history.  Social History Social History   Tobacco Use  . Smoking status: Not on file  Substance Use Topics  . Alcohol use: Not on file  . Drug use: Not on file  patient drinks  Review of Systems or uses systems is unobtainable as patient is unconscious   ____________________________________________   PHYSICAL EXAM:  VITAL SIGNS: ED Triage Vitals  Enc Vitals Group     BP 05/22/17 0708 122/76     Pulse Rate 05/22/17 0708 91     Resp 05/22/17 0708 (!) 24     Temp 05/22/17 0708 (!) 96.2 F (35.7 C)     Temp Source 05/22/17 0708  Axillary     SpO2 05/22/17 0708 100 %     Weight 05/22/17 0709 300 lb (136.1 kg)     Height --      Head Circumference --      Peak Flow --      Pain Score --      Pain Loc --      Pain Edu? --      Excl. in [place]? --     Constitutional: patient is minimally responsive she is shivering Eyes: Conjunctivae are normal. pupils are equal and round Head: Atraumatic. Nose: No congestion/rhinnorhea. Mouth/Throat:patient won't open her mouth. Neck: No stridor.   Cardiovascular: Normal rate, regular rhythm. Grossly normal heart sounds.  Good peripheral circulation. Respiratory: Normal respiratory effort.  No retractions. Lungs CTAB. Gastrointestinal: Soft and nontender. No distention. No abdominal bruits. No CVA tenderness. Musculoskeletal: No lower extremity tenderness nor edema.  No joint effusions. Neurologic: patient is shivering and moving all extremities on occluded is not a seizure Skin:  Skin is warm, dry and intact. No rash noted.   ____________________________________________   LABS (all labs ordered are listed, but only abnormal results are displayed)  Labs Reviewed  CBC - Abnormal; Notable for the following components:      Result Value   RDW 15.5 (*)    All other components within normal limits  COMPREHENSIVE METABOLIC PANEL - Abnormal; Notable for the following components:   Glucose, Bld 105 (*)    BUN 21 (*)  Creatinine, Ser 1.03 (*)    Calcium 8.7 (*)    All other components within normal limits  ETHANOL - Abnormal; Notable for the following components:   Alcohol, Ethyl (B) 289 (*)    All other components within normal limits  ACETAMINOPHEN LEVEL - Abnormal; Notable for the following components:   Acetaminophen (Tylenol), Serum <10 (*)    All other components within normal limits  URINALYSIS, COMPLETE (UACMP) WITH MICROSCOPIC - Abnormal; Notable for the following components:   Color, Urine STRAW (*)    APPearance CLEAR (*)    All other components within normal  limits  BLOOD GAS, VENOUS - Abnormal; Notable for the following components:   Bicarbonate 28.2 (*)    All other components within normal limits  URINE DRUG SCREEN, QUALITATIVE ([facility] ONLY)  BRAIN NATRIURETIC PEPTIDE  LACTIC ACID, PLASMA  LACTIC ACID, PLASMA  POCT PREGNANCY, URINE   ____________________________________________  EKG  KG read and interpreted by me shows normal sinus rhythm rate of 92 normal axis no acute ST-T wave changes ____________________________________________  RADIOLOGY a CT of the head neck read as normal. CT of the chest is a poor inspiratory effort. I do not believe there is any venous congestion. Patient has no other signs of congestive failure.  ____________________________________________   PROCEDURES  Procedure(s) performed:   Procedures  Critical Care performed:  ____________________________________________   INITIAL IMPRESSION / ASSESSMENT AND PLAN / ED COURSE  patient's temperature was taken in the axillary area. This may account for this being low but she is shivering. Put her on the bear hugger. When the patient is on the bear hugger was shivering. She appears to be more comfortable.         ----------------------------------------- 10:27 AM on 05/22/2017 -----------------------------------------  Patient is now awake alert oriented 3 looks well   ____________________________________________   FINAL CLINICAL IMPRESSION(S) / ED DIAGNOSES  Final diagnoses:  Syncope and collapse  Alcoholic intoxication without complication [facility])     ED Discharge Orders    None       Note:  This document was prepared using Dragon voice recognition software and may include unintentional dictation errors.    Nena Polio, MD 05/22/17 1028

## 2017-05-22 NOTE — ED Notes (Signed)
Pt awake at this time  

## 2017-05-22 NOTE — ED Notes (Signed)
Pt awake, called her husband. Son in room arguing with his mother.

## 2017-05-22 NOTE — Discharge Instructions (Signed)
all of your tests today have been normal. The only thing that I found a suture alcohol level was 289. Please be careful and drink. Cut down on alcohol just a little bit especially for the next few days. Return for any further problems that headaches weakness fever chills vomiting etc. please follow-up with your regular doctor later this week.

## 2017-05-22 NOTE — ED Triage Notes (Signed)
Per EMS: patient originally able to nod and say yes or no at EMS arrival to patient's home. Patient admitted to alcohol use, denies other substances. Patient reported history of hypotension and fibromyalgia.   Patient currently dry heaving, not answering any questions for this RN.    Patient currently not responding even to painful stimuli.   MD Eulogio Bear to bedside.   Patient coughs, withdraws from ammonia inhalent.

## 2017-05-24 ENCOUNTER — Encounter: Payer: Self-pay | Admitting: Family Medicine

## 2017-05-25 ENCOUNTER — Other Ambulatory Visit: Payer: Self-pay | Admitting: Family Medicine

## 2017-05-25 DIAGNOSIS — M797 Fibromyalgia: Secondary | ICD-10-CM

## 2017-06-22 DIAGNOSIS — R05 Cough: Secondary | ICD-10-CM | POA: Diagnosis not present

## 2017-07-01 ENCOUNTER — Other Ambulatory Visit: Payer: Self-pay | Admitting: Family Medicine

## 2017-07-01 DIAGNOSIS — M797 Fibromyalgia: Secondary | ICD-10-CM

## 2017-08-11 ENCOUNTER — Other Ambulatory Visit: Payer: Self-pay | Admitting: Family Medicine

## 2017-08-11 DIAGNOSIS — M797 Fibromyalgia: Secondary | ICD-10-CM

## 2017-09-14 ENCOUNTER — Other Ambulatory Visit: Payer: Self-pay | Admitting: Family Medicine

## 2017-09-14 DIAGNOSIS — M797 Fibromyalgia: Secondary | ICD-10-CM

## 2017-10-18 ENCOUNTER — Other Ambulatory Visit: Payer: Self-pay

## 2017-10-18 ENCOUNTER — Other Ambulatory Visit: Payer: Self-pay | Admitting: Family Medicine

## 2017-10-18 ENCOUNTER — Telehealth: Payer: Self-pay | Admitting: Family Medicine

## 2017-10-18 DIAGNOSIS — M797 Fibromyalgia: Secondary | ICD-10-CM

## 2017-10-18 MED ORDER — DULOXETINE HCL 60 MG PO CPEP: 60.0000 mg | ORAL_CAPSULE | Freq: Every day | ORAL | 0 refills | Status: DC

## 2017-10-18 NOTE — Telephone Encounter (Signed)
Patient called in needing a few DULoxetine (CYMBALTA) 60 just til this Thursday June 27 for med refills. [facility] [facility] [place], [place] [address] AT [place] #:  [phone]

## 2017-10-21 ENCOUNTER — Ambulatory Visit: Payer: BLUE CROSS/BLUE SHIELD | Admitting: Family Medicine

## 2017-10-25 ENCOUNTER — Encounter: Payer: Self-pay | Admitting: Family Medicine

## 2017-10-25 ENCOUNTER — Ambulatory Visit: Payer: BLUE CROSS/BLUE SHIELD | Admitting: Family Medicine

## 2017-10-25 ENCOUNTER — Ambulatory Visit
Admission: RE | Admit: 2017-10-25 | Discharge: 2017-10-25 | Disposition: A | Discharge status: Home / Self Care | Payer: BLUE CROSS/BLUE SHIELD | Source: Ambulatory Visit | Attending: Family Medicine | Admitting: Family Medicine

## 2017-10-25 ENCOUNTER — Other Ambulatory Visit: Payer: Self-pay | Admitting: Family Medicine

## 2017-10-25 VITALS — BP 120/80 | HR 60 | Ht 66.0 in | Wt 236.0 lb

## 2017-10-25 DIAGNOSIS — M79671 Pain in right foot: Secondary | ICD-10-CM | POA: Diagnosis not present

## 2017-10-25 DIAGNOSIS — M797 Fibromyalgia: Secondary | ICD-10-CM | POA: Diagnosis not present

## 2017-10-25 DIAGNOSIS — S99921A Unspecified injury of right foot, initial encounter: Secondary | ICD-10-CM | POA: Diagnosis not present

## 2017-10-25 DIAGNOSIS — M25571 Pain in right ankle and joints of right foot: Secondary | ICD-10-CM | POA: Diagnosis not present

## 2017-10-25 DIAGNOSIS — Z9889 Other specified postprocedural states: Secondary | ICD-10-CM | POA: Diagnosis not present

## 2017-10-25 DIAGNOSIS — F17200 Nicotine dependence, unspecified, uncomplicated: Secondary | ICD-10-CM | POA: Diagnosis not present

## 2017-10-25 DIAGNOSIS — R270 Ataxia, unspecified: Secondary | ICD-10-CM | POA: Diagnosis not present

## 2017-10-25 MED ORDER — DULOXETINE HCL 60 MG PO CPEP: 60.0000 mg | ORAL_CAPSULE | Freq: Every day | ORAL | 1 refills | Status: DC

## 2017-10-25 MED ORDER — GABAPENTIN 100 MG PO CAPS
100.0000 mg | ORAL_CAPSULE | Freq: Three times a day (TID) | ORAL | 1 refills | Status: DC
Start: 2017-10-25 — End: 2018-11-07

## 2017-10-25 NOTE — Progress Notes (Signed)
Patient: Margaret Cuevas, Female    DOB: 09/03/1975, 42 y.o.   MRN: [id number] Visit Date: 10/25/2017  Today's Provider: Otilio Miu, MD   Chief Complaint  Patient presents with  . Fibromyalgia    needs refill on med  . Gait Problem    "feel off balance"  . Nicotine Dependence    start something for this   Subjective:    Nicotine Dependence  Presents for follow-up visit. Symptoms are negative for fatigue and sore throat. Her urge triggers include company of smokers. The symptoms have been stable. Her first smoke is from 8 to 10 AM. She smokes < 1/2 a pack of cigarettes per day.  Neurologic Problem  The patient's primary symptoms include clumsiness and a loss of balance. The patient's pertinent negatives include no altered mental status, focal sensory loss, focal weakness, memory loss, near-syncope, slurred speech, syncope, visual change or weakness. Primary symptoms comment: neuropathy/fibromyalgia. This is a new problem. The current episode started more than 1 month ago (2-3 months ago). The neurological problem developed gradually. The problem is unchanged. Associated symptoms include neck pain. Pertinent negatives include no abdominal pain, auditory change, aura, back pain, bladder incontinence, bowel incontinence, chest pain, confusion, diaphoresis, dizziness, fatigue, fever, headaches, light-headedness, nausea, palpitations, shortness of breath, vertigo or vomiting. The treatment provided moderate (pain improved on increased cymbalta and addition of gabapentin) relief. There is no history of a bleeding disorder, a CVA, head trauma, liver disease or seizures.  Foot Injury   The incident occurred more than 1 week ago (fall 1 month ago). The injury mechanism was a fall. The pain is present in the right toes. The quality of the pain is described as aching. The pain is at a severity of 4/10. The pain is moderate. The pain has been constant since onset. Associated symptoms include a loss of sensation  and tingling. Pertinent negatives include no inability to bear weight, loss of motion, muscle weakness or numbness. She reports no foreign bodies present. The symptoms are aggravated by movement. The treatment provided mild relief.    Review of Systems  Constitutional: Negative.  Negative for chills, diaphoresis, fatigue, fever and unexpected weight change.  HENT: Negative for congestion, ear discharge, ear pain, rhinorrhea, sinus pressure, sneezing and sore throat.   Eyes: Negative for photophobia, pain, discharge, redness and itching.  Respiratory: Negative for cough, shortness of breath, wheezing and stridor.   Cardiovascular: Negative for chest pain, palpitations and near-syncope.  Gastrointestinal: Negative for abdominal pain, blood in stool, bowel incontinence, constipation, diarrhea, nausea and vomiting.  Endocrine: Negative for cold intolerance, heat intolerance, polydipsia, polyphagia and polyuria.  Genitourinary: Negative for bladder incontinence, dysuria, flank pain, frequency, hematuria, menstrual problem, pelvic pain, urgency, vaginal bleeding and vaginal discharge.  Musculoskeletal: Positive for neck pain. Negative for arthralgias, back pain and myalgias.  Skin: Negative for rash.  Allergic/Immunologic: Negative for environmental allergies and food allergies.  Neurological: Positive for tingling and loss of balance. Negative for dizziness, vertigo, focal weakness, syncope, weakness, light-headedness, numbness and headaches.  Hematological: Negative for adenopathy. Does not bruise/bleed easily.  Psychiatric/Behavioral: Negative for confusion, dysphoric mood and memory loss. The patient is not nervous/anxious.     Social History   Socioeconomic History  . Marital status: Married    Spouse name: Not on file  . Number of children: Not on file  . Years of education: Not on file  . Highest education level: Not on file  Occupational History  . Not on file  Social Needs  .  Financial resource strain: Not on file  . Food insecurity:    Worry: Not on file    Inability: Not on file  . Transportation needs:    Medical: Not on file    Non-medical: Not on file  Tobacco Use  . Smoking status: Current Some Day Smoker    Types: Cigarettes  . Smokeless tobacco: Never Used  Substance and Sexual Activity  . Alcohol use: Yes  . Drug use: No  . Sexual activity: Yes  Lifestyle  . Physical activity:    Days per week: Not on file    Minutes per session: Not on file  . Stress: Not on file  Relationships  . Social connections:    Talks on phone: Not on file    Gets together: Not on file    Attends religious service: Not on file    Active member of club or organization: Not on file    Attends meetings of clubs or organizations: Not on file    Relationship status: Not on file  . Intimate partner violence:    Fear of current or ex partner: Not on file    Emotionally abused: Not on file    Physically abused: Not on file    Forced sexual activity: Not on file  Other Topics Concern  . Not on file  Social History Narrative  . Not on file    Patient Active Problem List   Diagnosis Date Noted  . Fibromyalgia 10/25/2017  . Influenza vaccine needed 01/20/2017    Past Surgical History:  Procedure Laterality Date  . ABDOMINAL HYSTERECTOMY    . ANKLE FRACTURE SURGERY     plates and screws placed on both sides  . BACK SURGERY    . CESAREAN SECTION    . CHOLECYSTECTOMY    . EAR TUBE REMOVAL    . GASTRIC BYPASS  2006  . ORIF FINGER / THUMB FRACTURE     screws in thumb    Her family history includes Cancer in her maternal grandmother and paternal grandfather; Diabetes in her maternal grandmother and mother; Hypertension in her father, maternal grandmother, and paternal grandmother.     Previous Medications   BLACK COHOSH PO    Take 1 capsule by mouth daily.   ESTRADIOL (VIVELLE-DOT) 0.05 MG/24HR PATCH    Place 1 patch (0.05 mg total) onto the skin 2 (two)  times a week.    Patient Care Team: Juline Patch, MD as PCP - General (Family Medicine) Juline Patch, MD (Family Medicine)      Objective:   Vitals: BP 120/80   Pulse 60   Ht 5\' 6"  (1.676 m)   Wt 236 lb (107 kg)   BMI 38.09 kg/m   Physical Exam  Constitutional: She is oriented to person, place, and time. She appears well-developed and well-nourished.  HENT:  Head: Normocephalic.  Right Ear: External ear normal.  Left Ear: External ear normal.  Mouth/Throat: Oropharynx is clear and moist.  Eyes: Pupils are equal, round, and reactive to light. Conjunctivae and EOM are normal. Lids are everted and swept, no foreign bodies found. Left eye exhibits no hordeolum. No foreign body present in the left eye. Right conjunctiva is not injected. Left conjunctiva is not injected. No scleral icterus.  Neck: Normal range of motion. Neck supple. No JVD present. No tracheal deviation present. No thyromegaly present.  Cardiovascular: Normal rate, regular rhythm, normal heart sounds and intact distal pulses. Exam reveals no gallop and no friction rub.  No murmur heard. Pulmonary/Chest: Effort normal and breath sounds normal. No respiratory distress. She has no wheezes. She has no rales.  Abdominal: Soft. Bowel sounds are normal. She exhibits no mass. There is no hepatosplenomegaly. There is no tenderness. There is no rebound and no guarding.  Musculoskeletal: Normal range of motion. She exhibits no edema or tenderness.  Lymphadenopathy:    She has no cervical adenopathy.  Neurological: She is alert and oriented to person, place, and time. She has normal strength. She displays normal reflexes. No cranial nerve deficit.  Skin: Skin is warm. No rash noted.  Psychiatric: She has a normal mood and affect. Her mood appears not anxious. She does not exhibit a depressed mood.  Nursing note and vitals reviewed.     Assessment & Plan:    Reviewed patient's Family Medical History   Initial  Preventative Physical Exam Reviewed and updated list of patient's medical providers Assessment of cognitive impairment was done Assessed patient's functional ability Established a written schedule for health screening [place] Completed and Reviewed  Exercise Activities and Dietary recommendations Goals    None      Immunization History  Administered Date(s) Administered  . Influenza,inj,Quad PF,6+ Mos 01/20/2017    Health Maintenance  Topic Date Due  . HIV Screening  10/25/1992  . INFLUENZA VACCINE  11/25/2017  . PAP SMEAR  12/02/2019  . TETANUS/TDAP  06/06/2020     Discussed health benefits of physical activity, and encouraged her to engage in regular exercise appropriate for her age and condition.    ------------------------------------------------------------------------------------------------------------  Problem List Items Addressed This Visit      Other   Fibromyalgia - Primary    Relatively controlled. Continue on cymbalta 60mg  and gabapentin 100mg  tid.      Relevant Medications   DULoxetine (CYMBALTA) 60 MG capsule   gabapentin (NEURONTIN) 100 MG capsule    Other Visit Diagnoses    Tobacco dependence       Reemphasize need for complete abstinence.   Ataxia       NEW Onset "loss of balance" for past 2 months most recently resulting in falls.. Will refer to neurology to evaluate.    Relevant Orders   Ambulatory referral to Neurology   Toe injury, right, initial encounter       Persistent pain of right great toe since fall. will obtain xray  and frer if necessary.       Otilio Miu, MD [facility]  10/25/2017

## 2017-10-25 NOTE — Assessment & Plan Note (Signed)
Relatively controlled. Continue on cymbalta 60mg  and gabapentin 100mg  tid.

## 2017-10-27 DIAGNOSIS — M797 Fibromyalgia: Secondary | ICD-10-CM | POA: Diagnosis not present

## 2017-10-27 DIAGNOSIS — M542 Cervicalgia: Secondary | ICD-10-CM | POA: Diagnosis not present

## 2017-10-27 DIAGNOSIS — E669 Obesity, unspecified: Secondary | ICD-10-CM | POA: Diagnosis not present

## 2017-11-09 ENCOUNTER — Other Ambulatory Visit: Payer: Self-pay | Admitting: Family Medicine

## 2017-11-09 DIAGNOSIS — M797 Fibromyalgia: Secondary | ICD-10-CM

## 2018-02-10 ENCOUNTER — Other Ambulatory Visit: Payer: Self-pay | Admitting: Family Medicine

## 2018-02-10 DIAGNOSIS — N951 Menopausal and female climacteric states: Secondary | ICD-10-CM

## 2018-02-14 ENCOUNTER — Other Ambulatory Visit: Payer: Self-pay | Admitting: Family Medicine

## 2018-03-17 ENCOUNTER — Other Ambulatory Visit: Payer: Self-pay | Admitting: Family Medicine

## 2018-03-17 ENCOUNTER — Encounter: Payer: Self-pay | Admitting: Family Medicine

## 2018-03-17 ENCOUNTER — Ambulatory Visit: Payer: BLUE CROSS/BLUE SHIELD | Admitting: Family Medicine

## 2018-03-17 VITALS — BP 110/80 | HR 80 | Ht 66.0 in | Wt 244.0 lb

## 2018-03-17 DIAGNOSIS — F321 Major depressive disorder, single episode, moderate: Secondary | ICD-10-CM | POA: Diagnosis not present

## 2018-03-17 DIAGNOSIS — E669 Obesity, unspecified: Secondary | ICD-10-CM

## 2018-03-17 DIAGNOSIS — F17200 Nicotine dependence, unspecified, uncomplicated: Secondary | ICD-10-CM | POA: Diagnosis not present

## 2018-03-17 DIAGNOSIS — F419 Anxiety disorder, unspecified: Secondary | ICD-10-CM

## 2018-03-17 DIAGNOSIS — N951 Menopausal and female climacteric states: Secondary | ICD-10-CM

## 2018-03-17 MED ORDER — BUSPIRONE HCL 5 MG PO TABS: 5.0000 mg | ORAL_TABLET | Freq: Two times a day (BID) | ORAL | 1 refills | Status: DC

## 2018-03-17 NOTE — Progress Notes (Signed)
Date:  03/17/2018   Name:  Margaret Cuevas   DOB:  01-14-1976   MRN:  [id number]   Chief Complaint: Depression (PHQ=7, needs referral to psych) Depression       The patient presents with depression.  This is a chronic problem.  The current episode started more than 1 year ago.   The onset quality is sudden.   The problem occurs daily.  The problem has been gradually worsening since onset.  Associated symptoms include decreased concentration, fatigue, helplessness, hopelessness, irritable and sad.  Associated symptoms include does not have insomnia, no restlessness, no decreased interest, no appetite change, no body aches, no myalgias, no headaches, no indigestion and no suicidal ideas.     The symptoms are aggravated by family issues.  Past treatments include nothing.  Compliance with treatment is good.  Previous treatment provided mild relief.  Risk factors include alcohol intake and marital problems.   Past medical history includes depression.      Review of Systems  Constitutional: Positive for fatigue. Negative for appetite change, chills, fever and unexpected weight change.  HENT: Negative for congestion, ear discharge, ear pain, rhinorrhea, sinus pressure, sneezing and sore throat.   Eyes: Negative for photophobia, pain, discharge, redness and itching.  Respiratory: Negative for cough, shortness of breath, wheezing and stridor.   Gastrointestinal: Negative for abdominal pain, blood in stool, constipation, diarrhea, nausea and vomiting.  Endocrine: Negative for cold intolerance, heat intolerance, polydipsia, polyphagia and polyuria.  Genitourinary: Negative for dysuria, flank pain, frequency, hematuria, menstrual problem, pelvic pain, urgency, vaginal bleeding and vaginal discharge.  Musculoskeletal: Negative for arthralgias, back pain and myalgias.  Skin: Negative for rash.  Allergic/Immunologic: Negative for environmental allergies and food allergies.  Neurological: Negative for  dizziness, weakness, light-headedness, numbness and headaches.  Hematological: Negative for adenopathy. Does not bruise/bleed easily.  Psychiatric/Behavioral: Positive for decreased concentration and depression. Negative for dysphoric mood and suicidal ideas. The patient is not nervous/anxious and does not have insomnia.     Patient Active Problem List   Diagnosis Date Noted  . Fibromyalgia 10/25/2017  . Influenza vaccine needed 01/20/2017    Allergies  Allergen Reactions  . Ibuprofen Other (See Comments)    Other Reaction: Other reaction-swells    Past Surgical History:  Procedure Laterality Date  . ABDOMINAL HYSTERECTOMY    . ANKLE FRACTURE SURGERY     plates and screws placed on both sides  . BACK SURGERY    . CESAREAN SECTION    . CHOLECYSTECTOMY    . EAR TUBE REMOVAL    . GASTRIC BYPASS  2006  . ORIF FINGER / THUMB FRACTURE     screws in thumb    Social History   Tobacco Use  . Smoking status: Current Some Day Smoker    Types: Cigarettes  . Smokeless tobacco: Never Used  Substance Use Topics  . Alcohol use: Yes  . Drug use: No     Medication list has been reviewed and updated.  Current Meds  Medication Sig  . DULoxetine (CYMBALTA) 60 MG capsule Take 1 capsule (60 mg total) by mouth daily.  Marland Kitchen estradiol (VIVELLE-DOT) 0.05 MG/24HR patch UNWRAP AND APPLY 1 PATCH TO SKIN TWO TIMES PER WEEK  . gabapentin (NEURONTIN) 100 MG capsule Take 1 capsule (100 mg total) by mouth 3 (three) times daily.  . meloxicam (MOBIC) 15 MG tablet Take 1 tablet by mouth daily. Mundy  . tiZANidine (ZANAFLEX) 4 MG tablet Take 1 tablet by mouth at bedtime.  Mundy    [facility] 2/9 Scores 03/17/2018 12/01/2016  PHQ - 2 Score 3 0  PHQ- 9 Score 7 6    Physical Exam  Constitutional: She is oriented to person, place, and time. She appears well-developed and well-nourished. She is irritable.  HENT:  Head: Normocephalic.  Right Ear: External ear normal.  Left Ear: External ear normal.    Mouth/Throat: Oropharynx is clear and moist.  Eyes: Pupils are equal, round, and reactive to light. Conjunctivae and EOM are normal. Lids are everted and swept, no foreign bodies found. Left eye exhibits no hordeolum. No foreign body present in the left eye. Right conjunctiva is not injected. Left conjunctiva is not injected. No scleral icterus.  Neck: Normal range of motion. Neck supple. No JVD present. No tracheal deviation present. No thyromegaly present.  Cardiovascular: Normal rate, regular rhythm, normal heart sounds and intact distal pulses. Exam reveals no gallop and no friction rub.  No murmur heard. Pulmonary/Chest: Effort normal and breath sounds normal. No respiratory distress. She has no wheezes. She has no rales.  Abdominal: Soft. Bowel sounds are normal. She exhibits no mass. There is no hepatosplenomegaly. There is no tenderness. There is no rebound and no guarding.  Musculoskeletal: Normal range of motion. She exhibits no edema or tenderness.  Lymphadenopathy:    She has no cervical adenopathy.  Neurological: She is alert and oriented to person, place, and time. She has normal strength. She displays normal reflexes. No cranial nerve deficit.  Skin: Skin is warm. No rash noted.  Psychiatric: She has a normal mood and affect. Her mood appears not anxious. She does not exhibit a depressed mood.  Nursing note and vitals reviewed.   BP 110/80   Pulse 80   Ht 5\' 6"  (1.676 m)   Wt 244 lb (110.7 kg)   BMI 39.38 kg/m   Assessment and Plan: 1. Tobacco dependence Smoking cessation discussed  2. Current moderate episode of major depressive disorder, unspecified whether recurrent ([place]) Continue cymbalta, add buspirone- recheck in 6 weeks/ refer to [facility] and gave info to [facility] location to pt. Discussed Anecia Furbish and walk in hours in [place] facility - busPIRone (BUSPAR) 5 MG tablet; Take 1 tablet (5 mg total) by mouth 2 (two) times daily.  Dispense: 60 tablet; Refill: 1 -  Ambulatory referral to Psychiatry  3. Anxiety Start Buspirone/ added to Cymbalta - busPIRone (BUSPAR) 5 MG tablet; Take 1 tablet (5 mg total) by mouth 2 (two) times daily.  Dispense: 60 tablet; Refill: 1 - Ambulatory referral to Psychiatry  4. Obesity (BMI 35.0-39.9 without comorbidity) Gave weight loss info and diet control     Dr. Otilio Miu [facility]  03/17/2018

## 2018-03-17 NOTE — Patient Instructions (Signed)

## 2018-03-19 ENCOUNTER — Other Ambulatory Visit: Payer: Self-pay | Admitting: Family Medicine

## 2018-03-19 DIAGNOSIS — M797 Fibromyalgia: Secondary | ICD-10-CM

## 2018-04-28 ENCOUNTER — Ambulatory Visit: Payer: BLUE CROSS/BLUE SHIELD | Admitting: Family Medicine

## 2018-04-28 ENCOUNTER — Encounter: Payer: Self-pay | Admitting: Family Medicine

## 2018-05-20 ENCOUNTER — Other Ambulatory Visit: Payer: Self-pay | Admitting: Family Medicine

## 2018-05-20 DIAGNOSIS — F321 Major depressive disorder, single episode, moderate: Secondary | ICD-10-CM

## 2018-05-20 DIAGNOSIS — F419 Anxiety disorder, unspecified: Secondary | ICD-10-CM

## 2018-06-13 ENCOUNTER — Other Ambulatory Visit: Payer: Self-pay | Admitting: Family Medicine

## 2018-06-13 DIAGNOSIS — M797 Fibromyalgia: Secondary | ICD-10-CM

## 2018-07-08 ENCOUNTER — Other Ambulatory Visit: Payer: Self-pay | Admitting: Family Medicine

## 2018-07-08 DIAGNOSIS — N951 Menopausal and female climacteric states: Secondary | ICD-10-CM

## 2018-07-20 DIAGNOSIS — J028 Acute pharyngitis due to other specified organisms: Secondary | ICD-10-CM | POA: Diagnosis not present

## 2018-07-20 DIAGNOSIS — R51 Headache: Secondary | ICD-10-CM | POA: Diagnosis not present

## 2018-07-20 DIAGNOSIS — J40 Bronchitis, not specified as acute or chronic: Secondary | ICD-10-CM | POA: Diagnosis not present

## 2018-07-20 DIAGNOSIS — R0602 Shortness of breath: Secondary | ICD-10-CM | POA: Diagnosis not present

## 2018-07-20 DIAGNOSIS — R05 Cough: Secondary | ICD-10-CM | POA: Diagnosis not present

## 2018-07-20 DIAGNOSIS — R52 Pain, unspecified: Secondary | ICD-10-CM | POA: Diagnosis not present

## 2018-07-25 ENCOUNTER — Telehealth: Payer: Self-pay

## 2018-07-25 DIAGNOSIS — R05 Cough: Secondary | ICD-10-CM | POA: Diagnosis not present

## 2018-07-25 NOTE — Telephone Encounter (Signed)
Pt called concerning "ready to go back to work". I saw a note from the 24th of March where she had been seen by Dr Fulton Mole walk in clinic and had a telephone message on 27th of March. She was told to come back to them today if more concerns. I called to verify follow up with Dr Fulton Mole due to pt saying she was supposed to follow up here. I spoke to the receptionist who DID verify follow up with [facility] since he was the one who diagnosed and treated pt. I called pt back to tell her that follow up with [facility] was needed instead of [facility]. Patient voiced understanding.

## 2018-08-05 ENCOUNTER — Telehealth: Payer: Self-pay

## 2018-08-05 NOTE — Telephone Encounter (Signed)
Pt called in complaining of sore throat, congestion and stated she had went back to the hospital on the 30th. I did see where she went back to the walk-in clinic on the 30th- they put her on a second antibiotic/ doxycycline and gave her prednisone again as well as tussionex. I explained to pt this is probably allergies as she has had plenty of antibiotic coverage and prednisone. She said she would give it a couple of more days. I told her that if she is not better to call the doctor that she originally started with, Dr. Ronnald Ramp did not feel comfortable with starting a 3rd antibiotic. This could result in C-Diff/ severe case of diarrhea. Pt expressed understanding.

## 2018-08-16 DIAGNOSIS — J45909 Unspecified asthma, uncomplicated: Secondary | ICD-10-CM | POA: Diagnosis not present

## 2018-08-16 DIAGNOSIS — R0602 Shortness of breath: Secondary | ICD-10-CM | POA: Diagnosis not present

## 2018-08-16 DIAGNOSIS — M795 Residual foreign body in soft tissue: Secondary | ICD-10-CM | POA: Diagnosis not present

## 2018-08-16 DIAGNOSIS — J4521 Mild intermittent asthma with (acute) exacerbation: Secondary | ICD-10-CM | POA: Diagnosis not present

## 2018-08-16 DIAGNOSIS — R05 Cough: Secondary | ICD-10-CM | POA: Diagnosis not present

## 2018-11-01 ENCOUNTER — Other Ambulatory Visit: Payer: Self-pay

## 2018-11-01 DIAGNOSIS — N951 Menopausal and female climacteric states: Secondary | ICD-10-CM

## 2018-11-01 DIAGNOSIS — F321 Major depressive disorder, single episode, moderate: Secondary | ICD-10-CM

## 2018-11-01 DIAGNOSIS — F419 Anxiety disorder, unspecified: Secondary | ICD-10-CM

## 2018-11-01 MED ORDER — ESTRADIOL 0.05 MG/24HR TD PTTW: MEDICATED_PATCH | TRANSDERMAL | 0 refills | Status: DC

## 2018-11-01 MED ORDER — BUSPIRONE HCL 5 MG PO TABS: ORAL_TABLET | ORAL | 0 refills | Status: DC

## 2018-11-03 ENCOUNTER — Ambulatory Visit: Payer: BC Managed Care – PPO | Admitting: Family Medicine

## 2018-11-07 ENCOUNTER — Other Ambulatory Visit: Payer: Self-pay

## 2018-11-07 ENCOUNTER — Ambulatory Visit (INDEPENDENT_AMBULATORY_CARE_PROVIDER_SITE_OTHER): Payer: BC Managed Care – PPO | Admitting: Family Medicine

## 2018-11-07 ENCOUNTER — Encounter: Payer: Self-pay | Admitting: Family Medicine

## 2018-11-07 VITALS — BP 110/80 | HR 80 | Ht 66.0 in | Wt 246.0 lb

## 2018-11-07 DIAGNOSIS — M797 Fibromyalgia: Secondary | ICD-10-CM

## 2018-11-07 DIAGNOSIS — N951 Menopausal and female climacteric states: Secondary | ICD-10-CM | POA: Diagnosis not present

## 2018-11-07 DIAGNOSIS — F321 Major depressive disorder, single episode, moderate: Secondary | ICD-10-CM

## 2018-11-07 DIAGNOSIS — F419 Anxiety disorder, unspecified: Secondary | ICD-10-CM

## 2018-11-07 DIAGNOSIS — J302 Other seasonal allergic rhinitis: Secondary | ICD-10-CM

## 2018-11-07 DIAGNOSIS — D171 Benign lipomatous neoplasm of skin and subcutaneous tissue of trunk: Secondary | ICD-10-CM

## 2018-11-07 MED ORDER — ESTRADIOL 0.05 MG/24HR TD PTTW: MEDICATED_PATCH | TRANSDERMAL | 11 refills | Status: DC

## 2018-11-07 MED ORDER — BUSPIRONE HCL 5 MG PO TABS: ORAL_TABLET | ORAL | 1 refills | Status: DC

## 2018-11-07 MED ORDER — CETIRIZINE HCL 10 MG PO TABS: 10.0000 mg | ORAL_TABLET | Freq: Every day | ORAL | 1 refills | Status: DC

## 2018-11-07 MED ORDER — DULOXETINE HCL 60 MG PO CPEP: 60.0000 mg | ORAL_CAPSULE | Freq: Every day | ORAL | 1 refills | Status: DC

## 2018-11-07 NOTE — Patient Instructions (Addendum)
Lipoma  A lipoma is a noncancerous (benign) tumor that is made up of fat cells. This is a very common type of soft-tissue growth. Lipomas are usually found under the skin (subcutaneous). They may occur in any tissue of the body that contains fat. Common areas for lipomas to appear include the back, shoulders, buttocks, and thighs.  Lipomas grow slowly, and they are usually painless. Most lipomas do not cause problems and do not require treatment. What are the causes? The cause of this condition is not known. What increases the risk? You are more likely to develop this condition if:  You are 41-6 years old.  You have a family history of lipomas. What are the signs or symptoms? A lipoma usually appears as a small, round bump under the skin. In most cases, the lump will:  Feel soft or rubbery.  Not cause pain or other symptoms. However, if a lipoma is located in an area where it pushes on nerves, it can become painful or cause other symptoms. How is this diagnosed? A lipoma can usually be diagnosed with a physical exam. You may also have tests to confirm the diagnosis and to rule out other conditions. Tests may include:  Imaging tests, such as a CT scan or MRI.  Removal of a tissue sample to be looked at under a microscope (biopsy). How is this treated? Treatment for this condition depends on the size of the lipoma and whether it is causing any symptoms.  For small lipomas that are not causing problems, no treatment is needed.  If a lipoma is bigger or it causes problems, surgery may be done to remove the lipoma. Lipomas can also be removed to improve appearance. Most often, the procedure is done after applying a medicine that numbs the area (local anesthetic). Follow these instructions at home:  Watch your lipoma for any changes.  Keep all follow-up visits as told by your health care provider. This is important. Contact a health care provider if:  Your lipoma becomes larger or  hard.  Your lipoma becomes painful, red, or increasingly swollen. These could be signs of infection or a more serious condition. Get help right away if:  You develop tingling or numbness in an area near the lipoma. This could indicate that the lipoma is causing nerve damage. Summary  A lipoma is a noncancerous tumor that is made up of fat cells.  Most lipomas do not cause problems and do not require treatment.  If a lipoma is bigger or it causes problems, surgery may be done to remove the lipoma. This information is not intended to replace advice given to you by your health care provider. Make sure you discuss any questions you have with your health care provider. Document Released: 04/03/2002 Document Revised: 03/30/2017 Document Reviewed: 03/30/2017 Elsevier Patient Education  [place]  LOW-CHOLESTEROL, LOW-TRIGLYCERIDE DIETS    FOODS TO USE   MEATS, FISH Choose lean meats (chicken, [place], veal, and non-fatty cuts of beef with excess fat trimmed; one serving = 3 oz of cooked meat). Also, fresh or frozen fish, canned fish packed in water, and shellfish (lobster, crabs, shrimp, and oysters). Limit use to no more than one serving of one of these per week. Shellfish are high in cholesterol but low in saturated fat and should be used sparingly. Meats and fish should be broiled (pan or oven) or baked on a rack.  EGGS Egg substitutes and egg whites (use freely). Egg yolks (limit two per week).  FRUITS Eat three servings of fresh fruit per day (1 serving =  cup). Be sure to have at least one citrus fruit daily. Frozen and canned fruit with no sugar or syrup added may be used.  VEGETABLES Most vegetables are not limited (see next page). One dark-green (string beans, escarole) or one deep yellow (squash) vegetable is recommended daily. Cauliflower, broccoli, and celery, as well as potato skins, are recommended for their fiber content. (Fiber is associated with cholesterol  reduction) It is preferable to steam vegetables, but they may be boiled, strained, or braised with polyunsaturated vegetable oil (see below).  BEANS Dried peas or beans (1 serving =  cup) may be used as a bread substitute.  NUTS Almonds, walnuts, and peanuts may be used sparingly  (1 serving = 1 Tablespoonful). Use pumpkin, sesame, or sunflower seeds.  BREADS, GRAINS One roll or one slice of whole grain or enriched bread may be used, or three soda crackers or four pieces of melba toast as a substitute. Spaghetti, rice or noodles ( cup) or  large ear of corn may be used as a bread substitute. In preparing these foods do not use butter or shortening, use soft margarine. Also use egg and sugar substitutes.  Choose high fiber grains, such as oats and whole wheat.  CEREALS Use  cup of hot cereal or  cup of cold cereal per day. Add a sugar substitute if desired, with 99% fat free or skim milk.  MILK PRODUCTS Always use 99% fat free or skim milk, dairy products such as low fat cheeses (farmer's uncreamed diet cottage), low-fat yogurt, and powdered skim milk.  FATS, OILS Use soft (not stick) margarine; vegetable oils that are high in polyunsaturated fats (such as safflower, sunflower, soybean, corn, and cottonseed). Always refrigerate meat drippings to harden the fat and remove it before preparing gravies  DESSERTS, SNACKS Limit to two servings per day; substitute each serving for a bread/cereal serving: ice milk, water sherbet (1/4 cup); unflavored gelatin or gelatin flavored with sugar substitute (1/3 cup); pudding prepared with skim milk (1/2 cup); egg white souffls; unbuttered popcorn (1  cups). Substitute carob for chocolate.  BEVERAGES Fresh fruit juices (limit 4 oz per day); black coffee, plain or herbal teas; soft drinks with sugar substitutes; club soda, preferably salt-free; cocoa made with skim milk or nonfat dried milk and water (sugar substitute added if desired); clear broth. Alcohol: limit two  servings per day (see second page).  MISCELLANEOUS  You may use the following freely: vinegar, spices, herbs, nonfat bouillon, mustard, Worcestershire sauce, soy sauce, flavoring essence.                  GUIDELINES FOR  LOW-CHOLESTEROL, LOW TRIGLYCERIDE DIETS    FOODS TO AVOID   MEATS, FISH Marbled beef, pork, bacon, sausage, and other pork products; fatty fowl (duck, goose); skin and fat of [place] and chicken; processed meats; luncheon meats (salami, bologna); frankfurters and fast-food hamburgers (theyre loaded with fat); organ meats (kidneys, liver); canned fish packed in oil.  EGGS Limit egg yolks to two per week.   FRUITS Coconuts (rich in saturated fats).  VEGETABLES Avoid avocados. Starchy vegetables (potatoes, corn, lima beans, dried peas, beans) may be used only if substitutes for a serving of bread or cereal. (Baked potato skin, however, is desirable for its fiber content.  BEANS Commercial baked beans with sugar and/or pork added.  NUTS Avoid nuts.  Limit peanuts and walnuts to one tablespoonful per day.  BREADS, GRAINS Any  baked goods with shortening and/or sugar. Commercial mixes with dried eggs and whole milk. Avoid sweet rolls, doughnuts, breakfast pastries (Danish), and sweetened packaged cereals (the added sugar converts readily to triglycerides).  MILK PRODUCTS Whole milk and whole-milk packaged goods; cream; ice cream; whole-milk puddings, yogurt, or cheeses; nondairy cream substitutes.  FATS, OILS Butter, lard, animal fats, bacon drippings, gravies, cream sauces as well as palm and coconut oils. All these are high in saturated fats. Examine labels on cholesterol free products for hydrogenated fats. (These are oils that have been hardened into solids and in the process have become saturated.)  DESSERTS, SNACKS Fried snack foods like potato chips; chocolate; candies in general; jams, jellies, syrups; whole- milk puddings; ice cream and milk sherbets; hydrogenated  peanut butter.  BEVERAGES Sugared fruit juices and soft drinks; cocoa made with whole milk and/or sugar. When using alcohol (1 oz liquor, 5 oz beer, or 2  oz dry table wine per serving), one serving must be substituted for one bread or cereal serving (limit, two servings of alcohol per day).   SPECIAL NOTES    1. Remember that even non-limited foods should be used in moderation. 2. While on a cholesterol-lowering diet, be sure to avoid animal fats and marbled meats. 3. 3. While on a triglyceride-lowering diet, be sure to avoid sweets and to control the amount of carbohydrates you eat (starchy foods such as flour, bread, potatoes).While on a tri-glyceride-lowering diet, be sure to avoid sweets 4. Buy a good low-fat cookbook, such as the one published by the American Heart Association. 5. Consult your physician if you have any questions.               [facility] Low Glycemic Diet Plan   Low Glycemic Foods (20-49) Moderate Glycemic Foods (50-69) High Glycemic Foods (70-100)      Breakfast Creals Breakfast Cereals Breakfast Cereals  All Bran All-Bran Fruit'n Oats   Bran Buds Bran Chex   Cheerios Corn chex    Fiber One Oatmeal (not instant)   Just Right Mini-Wheats   Corn Flakes Cream of Wheat    Oat Bran Special K Swiss Muesli   Grape Nuts Grape Nut Flakes      Grits Nutri-Grain    Fruits and fruit juice: Fruits Puffed Rice Puffed Wheat    (Limit to 1-2 Servings per day) Banana (under-ride) Dates   Rice Chex Rice Krispies    Apples Apricots (fresh/dried)   Figs Grapes   Shredded Wheat Team    Blackberries Blueberries   Kiwi Mango   Total     Cherries Cranberries   Oranges Raisins     Peaches Pears    Fruits  Plums Prunes   Fruit Juices Pineapple Watermelon    Grapefruit Raspberries   Cranberry Juice Orange Juice   Banana (over-ripe)     Strawberries Tangerines      Apple Juice Grapefruit Juice   Beans and Legumes Beverages  Tomato Juice     [place]-type baked beans Sodas, sweet tea, pineapple juice   Canned pinto, kidney, or navy beans   Beans and Legumes (fresh-cooked) Green peas Vegetables  Black-eyed peas Butter Beans    Potato, baked, boiled, fried, mashed  Chick peas Lentils   Vegetables French fries  Green beans Lima beans   Beets Carrots   Canned or frozen corn  Kidney beans Navy beans   Sweet potato Yam   Parsnips  Pinto beans Snow peas   Corn on the cob Winter squash  Non-starchy vegetables Grains Breads  Asparagus, avocado, broccoli, cabbage Cornmeal Rice, brown   Most breads (white and whole grain)  cauliflower, celery, cucumber, greens Rice, white Couscous   Bagels Bread sticks    lettuce, mushrooms, peppers, tomatoes  Bread stuffing Kaiser roll    okra, onions, spinach, summer squash Pasta Dinner rolls   [employer], cheese     Grains Ravioli, meat filled Spaghetti, white   Grains  Barley Bulgur    Rice, instant Tapioca, with milk    Rye Wild rice   Nuts    Cashews Macadamia   Candy and most cookies  Nuts and oils    Almonds, peanuts, sunflower seeds Snacks Snacks  hazelnuts, pecans, walnuts Chocolate Ice cream, lowfat   Donuts Corn chips    Oils that are liquid at room temperature Muffin Popcorn   Jelly beans Pretzels      Pastries  Dairy, fish, meat, soy, and eggs    Milk, skim Lowfat cheese    Restaurant and ethnic foods  Yogurt, lowfat, fruit sugar sweetened  Most [place] food (sugar in stir fry    or wok sauce)  Lean red meat Fish    Teriyaki-style meats and vegetables  Skinless chicken and [place], shellfish        Egg whites (up to 3 daily), Soy Products    Egg yolks (up to 7 or _____ per week)

## 2018-11-07 NOTE — Progress Notes (Signed)
Date:  11/07/2018   Name:  Margaret Cuevas   DOB:  03/23/1976   MRN:  [id number]   Chief Complaint: Allergic Rhinitis , Depression (PHQ9=2), Anxiety (GAD7=3), and hormone replacement therapy  Patient is a 43 year old female who presents for medication refill exam. The patient reports the following problems: knots on back. Health maintenance has been reviewed up to date.  Depression      The patient presents with depression.  This is a chronic problem.  The current episode started more than 1 year ago.   The onset quality is sudden.   The problem occurs every several days.  The problem has been gradually improving since onset.  Associated symptoms include fatigue.  Associated symptoms include no decreased concentration, no helplessness, no hopelessness, does not have insomnia, not irritable, no restlessness, no decreased interest, no appetite change, no body aches, no myalgias, no headaches, no indigestion, not sad and no suicidal ideas.     The symptoms are aggravated by nothing.  Past treatments include SSRIs - Selective serotonin reuptake inhibitors.  Past compliance problems include difficulty understanding directions.  Previous treatment provided mild relief.  Risk factors include a change in medication usage/dosage.   Past medical history includes anxiety and depression.   Anxiety Presents for follow-up visit. Symptoms include excessive worry. Patient reports no chest pain, compulsions, confusion, decreased concentration, depressed mood, dizziness, dry mouth, feeling of choking, hyperventilation, impotence, insomnia, irritability, malaise, muscle tension, nausea, nervous/anxious behavior, obsessions, palpitations, panic, restlessness, shortness of breath or suicidal ideas. Symptoms occur occasionally. The severity of symptoms is moderate. The quality of sleep is fair.   Her past medical history is significant for depression.  URI  This is a recurrent problem. The current episode started more  than 1 year ago. The problem has been waxing and waning. Associated symptoms include congestion and rhinorrhea. Pertinent negatives include no abdominal pain, chest pain, coughing, diarrhea, dysuria, ear pain, headaches, nausea, rash, sneezing, sore throat, vomiting or wheezing. Associated symptoms comments: Postnasal drainage. She has tried inhaler use for the symptoms. The treatment provided moderate relief.    Review of Systems  Constitutional: Positive for fatigue. Negative for appetite change, chills, fever, irritability and unexpected weight change.  HENT: Positive for congestion and rhinorrhea. Negative for ear discharge, ear pain, sinus pressure, sneezing and sore throat.   Eyes: Negative for photophobia, pain, discharge, redness and itching.  Respiratory: Negative for cough, shortness of breath, wheezing and stridor.   Cardiovascular: Negative for chest pain and palpitations.  Gastrointestinal: Negative for abdominal pain, blood in stool, constipation, diarrhea, nausea and vomiting.  Endocrine: Negative for cold intolerance, heat intolerance, polydipsia, polyphagia and polyuria.  Genitourinary: Negative for dysuria, flank pain, frequency, hematuria, impotence, menstrual problem, pelvic pain, urgency, vaginal bleeding and vaginal discharge.  Musculoskeletal: Negative for arthralgias, back pain and myalgias.  Skin: Negative for rash.  Allergic/Immunologic: Negative for environmental allergies and food allergies.  Neurological: Negative for dizziness, weakness, light-headedness, numbness and headaches.  Hematological: Negative for adenopathy. Does not bruise/bleed easily.  Psychiatric/Behavioral: Positive for depression. Negative for confusion, decreased concentration, dysphoric mood and suicidal ideas. The patient is not nervous/anxious and does not have insomnia.     Patient Active Problem List   Diagnosis Date Noted  . Fibromyalgia 10/25/2017  . Influenza vaccine needed 01/20/2017     Allergies  Allergen Reactions  . Ibuprofen Other (See Comments)    Other Reaction: Other reaction-swells    Past Surgical History:  Procedure Laterality Date  .  ABDOMINAL HYSTERECTOMY    . ANKLE FRACTURE SURGERY     plates and screws placed on both sides  . BACK SURGERY    . CESAREAN SECTION    . CHOLECYSTECTOMY    . EAR TUBE REMOVAL    . GASTRIC BYPASS  2006  . ORIF FINGER / THUMB FRACTURE     screws in thumb    Social History   Tobacco Use  . Smoking status: Current Some Day Smoker    Types: Cigarettes  . Smokeless tobacco: Never Used  Substance Use Topics  . Alcohol use: Yes  . Drug use: No     Medication list has been reviewed and updated.  Current Meds  Medication Sig  . busPIRone (BUSPAR) 5 MG tablet TAKE 1 TABLET(5 MG) BY MOUTH TWICE DAILY  . cetirizine (ZYRTEC) 10 MG tablet Take 10 mg by mouth daily.  . DULoxetine (CYMBALTA) 60 MG capsule Take 1 capsule (60 mg total) by mouth daily.  Marland Kitchen estradiol (VIVELLE-DOT) 0.05 MG/24HR patch UNWRAP AND APPLY 1 PATCH TO SKIN TWICE PER WEEK  . tiZANidine (ZANAFLEX) 4 MG tablet Take 1 tablet by mouth at bedtime. Mundy  . [DISCONTINUED] gabapentin (NEURONTIN) 100 MG capsule Take 1 capsule (100 mg total) by mouth 3 (three) times daily.    PHQ 2/9 Scores 11/07/2018 03/17/2018 12/01/2016  PHQ - 2 Score 0 3 0  PHQ- 9 Score 2 7 6     BP Readings from Last 3 Encounters:  11/07/18 110/80  03/17/18 110/80  10/25/17 120/80    Physical Exam Vitals signs and nursing note reviewed.  Constitutional:      General: She is not irritable.She is not in acute distress.    Appearance: She is not diaphoretic.  HENT:     Head: Normocephalic and atraumatic.     Right Ear: Tympanic membrane, ear canal and external ear normal.     Left Ear: Tympanic membrane, ear canal and external ear normal.     Nose: Nose normal. No congestion or rhinorrhea.     Mouth/Throat:     Mouth: Mucous membranes are moist.  Eyes:     General:        Right  eye: No discharge.        Left eye: No discharge.     Conjunctiva/sclera: Conjunctivae normal.     Pupils: Pupils are equal, round, and reactive to light.  Neck:     Musculoskeletal: Normal range of motion and neck supple. No neck rigidity.     Thyroid: No thyromegaly.     Vascular: No JVD.  Cardiovascular:     Rate and Rhythm: Normal rate and regular rhythm.     Pulses: Normal pulses.     Heart sounds: Normal heart sounds. No murmur. No friction rub. No gallop.   Pulmonary:     Effort: Pulmonary effort is normal.     Breath sounds: Normal breath sounds. No wheezing, rhonchi or rales.  Chest:     Chest wall: No tenderness.  Abdominal:     General: Bowel sounds are normal.     Palpations: Abdomen is soft. There is no mass.     Tenderness: There is no abdominal tenderness. There is no right CVA tenderness, left CVA tenderness or guarding.  Musculoskeletal: Normal range of motion.  Lymphadenopathy:     Cervical: No cervical adenopathy.  Skin:    General: Skin is warm and dry.     Comments: Palpable lipoma 1) right lumbar paraspinal/tender 2) left scapula/tender  Neurological:     Mental Status: She is alert.     Deep Tendon Reflexes: Reflexes are normal and symmetric.     Wt Readings from Last 3 Encounters:  11/07/18 246 lb (111.6 kg)  03/17/18 244 lb (110.7 kg)  10/25/17 236 lb (107 kg)    BP 110/80   Pulse 80   Ht 5\' 6"  (1.676 m)   Wt 246 lb (111.6 kg)   BMI 39.71 kg/m   Assessment and Plan: 1. Current moderate episode of major depressive disorder, unspecified whether recurrent (HCC) Chronic.  Controlled.  PHQ 2.  Continue duloxetine 60 mg once a day.                                                                                           - DULoxetine (CYMBALTA) 60 MG capsule; Take 1 capsule (60 mg total) by mouth daily.  Dispense: 90 capsule; Refill: 1  2. Anxiety Chronic.  Controlled.  Gad score 3.  Continue buspirone 5 mg 1 twice a day. - busPIRone (BUSPAR) 5 MG  tablet; TAKE 1 TABLET(5 MG) BY MOUTH TWICE DAILY  Dispense: 180 tablet; Refill: 1 - DULoxetine (CYMBALTA) 60 MG capsule; Take 1 capsule (60 mg total) by mouth daily.  Dispense: 90 capsule; Refill: 1  3. Fibromyalgia Chronic.  Controlled.  Continue duloxetine 60 mg once a day. - DULoxetine (CYMBALTA) 60 MG capsule; Take 1 capsule (60 mg total) by mouth daily.  Dispense: 90 capsule; Refill: 1  4. Vasomotor symptoms due to menopause Patient vasomotor symptoms due to menopause will continue estradiol patch 0.5 every 24 hours twice a week. - estradiol (VIVELLE-DOT) 0.05 MG/24HR patch; UNWRAP AND APPLY 1 PATCH TO SKIN TWICE PER WEEK  Dispense: 8 patch; Refill: 11  5. Seasonal allergic rhinitis, unspecified trigger Patient desires excision allergic rhinitis controlled with Zyrtec 10 mg once a day - cetirizine (ZYRTEC) 10 MG tablet; Take 1 tablet (10 mg total) by mouth daily.  Dispense: 90 tablet; Refill: 1  6. Lipoma of torso Patient has 2 lipoma 1 over the left scapula and one in the right lateral paraspinal area.  These are tender and would like to have them evaluated and possibly removed.  Will refer to general surgery. - Ambulatory referral to General Surgery

## 2018-11-15 ENCOUNTER — Encounter: Payer: Self-pay | Admitting: Surgery

## 2018-11-15 ENCOUNTER — Other Ambulatory Visit: Payer: Self-pay

## 2018-11-15 ENCOUNTER — Ambulatory Visit (INDEPENDENT_AMBULATORY_CARE_PROVIDER_SITE_OTHER): Payer: BC Managed Care – PPO | Admitting: Surgery

## 2018-11-15 VITALS — BP 122/87 | HR 80 | Temp 97.7°F | Ht 66.0 in | Wt 242.0 lb

## 2018-11-15 DIAGNOSIS — D171 Benign lipomatous neoplasm of skin and subcutaneous tissue of trunk: Secondary | ICD-10-CM | POA: Insufficient documentation

## 2018-11-15 NOTE — Progress Notes (Signed)
11/15/2018  Reason for Visit:  Lipomas of the back  Referring Provider:  Otilio Miu, MD  History of Present Illness: Margaret Cuevas is a 43 y.o. female presenting for evaluation of two back lipomas.  She has had them for about 2 years but more recently has noted that they are tender to palpation.  She reports she has one on the right lower back which is tender to touch, and the other one on the left upper back which becomes tender with particular shoulder movements.  Pain does not radiate in either location. She's uncertain if they are becoming larger.  Denies having any drainage, erythema, or induration in those areas.  Denies any other areas of tenderness.   Past Medical History: Past Medical History:  Diagnosis Date  . Fibromyalgia      Past Surgical History: Past Surgical History:  Procedure Laterality Date  . ABDOMINAL HYSTERECTOMY    . ANKLE FRACTURE SURGERY     plates and screws placed on both sides  . BACK SURGERY    . CESAREAN SECTION    . CHOLECYSTECTOMY    . EAR TUBE REMOVAL    . GASTRIC BYPASS  2006  . ORIF FINGER / THUMB FRACTURE     screws in thumb    Home Medications: Prior to Admission medications   Medication Sig Start Date End Date Taking? Authorizing Provider  busPIRone (BUSPAR) 5 MG tablet TAKE 1 TABLET(5 MG) BY MOUTH TWICE DAILY 11/07/18  Yes Juline Patch, MD  cetirizine (ZYRTEC) 10 MG tablet Take 1 tablet (10 mg total) by mouth daily. 11/07/18  Yes Juline Patch, MD  DULoxetine (CYMBALTA) 60 MG capsule Take 1 capsule (60 mg total) by mouth daily. 11/07/18  Yes Juline Patch, MD  estradiol (VIVELLE-DOT) 0.05 MG/24HR patch UNWRAP AND APPLY 1 PATCH TO SKIN TWICE PER WEEK 11/07/18  Yes Juline Patch, MD  tiZANidine (ZANAFLEX) 4 MG tablet Take 1 tablet by mouth at bedtime. Mundy 02/10/18  Yes [provider]    Allergies: Allergies  Allergen Reactions  . Ibuprofen Other (See Comments)    Other Reaction: Other reaction-swells    Social  History:  reports that she has been smoking cigarettes. She has never used smokeless tobacco. She reports current alcohol use. She reports that she does not use drugs.   Family History: Family History  Problem Relation Age of Onset  . Diabetes Mother   . Cancer Maternal Grandmother   . Diabetes Maternal Grandmother   . Hypertension Maternal Grandmother   . Cancer Paternal Grandfather   . Hypertension Father   . Hypertension Paternal Grandmother     Review of Systems: Review of Systems  Constitutional: Negative for chills and fever.  HENT: Negative for hearing loss.   Eyes: Negative for blurred vision.  Respiratory: Negative for shortness of breath.   Cardiovascular: Negative for chest pain.  Gastrointestinal: Negative for nausea and vomiting.  Genitourinary: Negative for dysuria.  Musculoskeletal: Negative for myalgias.  Skin:       Two back lipomas which are tender.  Neurological: Negative for dizziness.  Psychiatric/Behavioral: Negative for substance abuse.    Physical Exam BP 122/87   Pulse 80   Temp 97.7 F (36.5 C) (Skin)   Ht 5\' 6"  (1.676 m)   Wt 242 lb (109.8 kg)   SpO2 96%   BMI 39.06 kg/m  CONSTITUTIONAL: No acute distress HEENT:  Normocephalic, atraumatic, extraocular motion intact. NECK: Trachea is midline, and there is no jugular venous distension.  RESPIRATORY:  Lungs are clear, and breath sounds are equal bilaterally. Normal respiratory effort without pathologic use of accessory muscles. CARDIOVASCULAR: Heart is regular without murmurs, gallops, or rubs. GI: The abdomen is soft, non-distended, non-tender.  MUSCULOSKELETAL:  Normal muscle strength and tone in all four extremities.  No peripheral edema or cyanosis. SKIN: Patient has a small 1 cm mass at the left upper back, at the medial edge of the scapula.  There is tenderness to palpation, but no skin changes, drainage, or evidence of infection.  There is also a 1 cm mass in the right lower back, lateral  to the paraspinal muscles, which is tender to palpation, but also without any skin changes, drainage, or infection. NEUROLOGIC:  Motor and sensation is grossly normal.  Cranial nerves are grossly intact. PSYCH:  Alert and oriented to person, place and time. Affect is normal.  Laboratory Analysis: No results found for this or any previous visit (from the past 24 hour(s)).  Imaging: No results found.  Assessment and Plan: This is a 43 y.o. female with two back masses, likely lipomas.  Discussed with the patient that these masses are not at the skin level but in the subcutaneous layer.  These are likely lipomas.  Discussed with her that the vast majority of these masses are benign, particularly with such slow growth.  Nonetheless, we still will send them to pathology for further evaluation.  Discussed with her that we could excise the two masses in the office as in-office procedure.  She is in agreement.  Discussed with her the risks of bleeding, infection, and injury to surrounding structures.  She's willing to proceed.  She will be scheduled for 12/02/18.  Face-to-face time spent with the patient and care providers was 60 minutes, with more than 50% of the time spent counseling, educating, and coordinating care of the patient.     Melvyn Neth, [facility]

## 2018-11-15 NOTE — Patient Instructions (Signed)
Lipoma  A lipoma is a noncancerous (benign) tumor that is made up of fat cells. This is a very common type of soft-tissue growth. Lipomas are usually found under the skin (subcutaneous). They may occur in any tissue of the body that contains fat. Common areas for lipomas to appear include the back, shoulders, buttocks, and thighs.  Lipomas grow slowly, and they are usually painless. Most lipomas do not cause problems and do not require treatment. What are the causes? The cause of this condition is not known. What increases the risk? You are more likely to develop this condition if:  You are 40-60 years old.  You have a family history of lipomas. What are the signs or symptoms? A lipoma usually appears as a small, round bump under the skin. In most cases, the lump will:  Feel soft or rubbery.  Not cause pain or other symptoms. However, if a lipoma is located in an area where it pushes on nerves, it can become painful or cause other symptoms. How is this diagnosed? A lipoma can usually be diagnosed with a physical exam. You may also have tests to confirm the diagnosis and to rule out other conditions. Tests may include:  Imaging tests, such as a CT scan or MRI.  Removal of a tissue sample to be looked at under a microscope (biopsy). How is this treated? Treatment for this condition depends on the size of the lipoma and whether it is causing any symptoms.  For small lipomas that are not causing problems, no treatment is needed.  If a lipoma is bigger or it causes problems, surgery may be done to remove the lipoma. Lipomas can also be removed to improve appearance. Most often, the procedure is done after applying a medicine that numbs the area (local anesthetic). Follow these instructions at home:  Watch your lipoma for any changes.  Keep all follow-up visits as told by your health care provider. This is important. Contact a health care provider if:  Your lipoma becomes larger or  hard.  Your lipoma becomes painful, red, or increasingly swollen. These could be signs of infection or a more serious condition. Get help right away if:  You develop tingling or numbness in an area near the lipoma. This could indicate that the lipoma is causing nerve damage. Summary  A lipoma is a noncancerous tumor that is made up of fat cells.  Most lipomas do not cause problems and do not require treatment.  If a lipoma is bigger or it causes problems, surgery may be done to remove the lipoma. This information is not intended to replace advice given to you by your health care provider. Make sure you discuss any questions you have with your health care provider. Document Released: 04/03/2002 Document Revised: 03/30/2017 Document Reviewed: 03/30/2017 Elsevier Patient Education  2020 [employer]  

## 2018-12-02 ENCOUNTER — Encounter: Payer: Self-pay | Admitting: Surgery

## 2018-12-02 ENCOUNTER — Other Ambulatory Visit: Payer: Self-pay

## 2018-12-02 ENCOUNTER — Ambulatory Visit (INDEPENDENT_AMBULATORY_CARE_PROVIDER_SITE_OTHER): Payer: BC Managed Care – PPO | Admitting: Surgery

## 2018-12-02 VITALS — BP 141/87 | HR 75 | Temp 97.2°F | Ht 66.0 in | Wt 239.0 lb

## 2018-12-02 DIAGNOSIS — D171 Benign lipomatous neoplasm of skin and subcutaneous tissue of trunk: Secondary | ICD-10-CM | POA: Diagnosis not present

## 2018-12-02 NOTE — Patient Instructions (Addendum)
Today we have removed a Lipoma in our office. Please see information below regarding this type of tumor.  You are free to shower in 48 hours. This will be on 12/04/18.  You have glue on your skin and sutures under the skin. The glue will come off on it's own in 10-14 days. You may shower normally until this occurs but do not submerge.  Please use Tylenol or Ibuprofen for pain as needed.  We will see you back in 2 weeks to ensure that this has healed and to review the final pathology. Please see your appointment below. You may continue your regular activities right away but if you are having pain while doing something, stop what you are doing and try this activity once again in 3 days. Please call our office with any questions or concerns prior to your appointment.   Lipoma Removal Lipoma removal is a surgical procedure to remove a noncancerous (benign) tumor that is made up of fat cells (lipoma). Most lipomas are small and painless and do not require treatment. They can form in many areas of the body but are most common under the skin of the back, shoulders, arms, and thighs. You may need lipoma removal if you have a lipoma that is large, growing, or causing discomfort. Lipoma removal may also be done for cosmetic reasons. Tell a health care provider about:  Any allergies you have.  All medicines you are taking, including vitamins, herbs, eye drops, creams, and over-the-counter medicines.  Any problems you or family members have had with anesthetic medicines.  Any blood disorders you have.  Any surgeries you have had.  Any medical conditions you have.  Whether you are pregnant or may be pregnant. What are the risks? Generally, this is a safe procedure. However, problems may occur, including:  Infection.  Bleeding.  Allergic reactions to medicines.  Damage to nerves or blood vessels near the lipoma.  Scarring.  What happens before the procedure? Staying hydrated Follow  instructions from your health care provider about hydration, which may include:  Up to 2 hours before the procedure - you may continue to drink clear liquids, such as water, clear fruit juice, black coffee, and plain tea.  Eating and drinking restrictions Follow instructions from your health care provider about eating and drinking, which may include:  8 hours before the procedure - stop eating heavy meals or foods such as meat, fried foods, or fatty foods.  6 hours before the procedure - stop eating light meals or foods, such as toast or cereal.  6 hours before the procedure - stop drinking milk or drinks that contain milk.  2 hours before the procedure - stop drinking clear liquids.  Medicines  Ask your health care provider about: ? Changing or stopping your regular medicines. This is especially important if you are taking diabetes medicines or blood thinners. ? Taking medicines such as aspirin and ibuprofen. These medicines can thin your blood. Do not take these medicines before your procedure if your health care provider instructs you not to.  You may be given antibiotic medicine to help prevent infection. General instructions  Ask your health care provider how your surgical site will be marked or identified.  You will have a physical exam. Your health care provider will check the size of the lipoma and whether it can be moved easily.  You may have imaging tests, such as: ? X-rays. ? CT scan. ? MRI.  Plan to have someone take you home from  the hospital or clinic. What happens during the procedure?  To reduce your risk of infection: ? Your health care team will wash or sanitize their hands. ? Your skin will be washed with soap.  You will be given one or more of the following: ? A medicine to help you relax (sedative). ? A medicine to numb the area (local anesthetic). ? A medicine to make you fall asleep (general anesthetic). ? A medicine that is injected into an area of  your body to numb everything below the injection site (regional anesthetic).  An incision will be made over the lipoma or very near the lipoma. The incision may be made in a natural skin line or crease.  Tissues, nerves, and blood vessels near the lipoma will be moved out of the way.  The lipoma and the capsule that surrounds it will be separated from the surrounding tissues.  The lipoma will be removed.  The incision may be closed with stitches (sutures).  A bandage (dressing) will be placed over the incision. What happens after the procedure?  Do not drive for 24 hours if you received a sedative.  Your blood pressure, heart rate, breathing rate, and blood oxygen level will be monitored until the medicines you were given have worn off. This information is not intended to replace advice given to you by your health care provider. Make sure you discuss any questions you have with your health care provider. Document Released: 06/27/2015 Document Revised: 09/19/2015 Document Reviewed: 06/27/2015 Elsevier Interactive Patient Education  2018 [employer].  Take three ibuprofen n every 8 hours.

## 2018-12-02 NOTE — Progress Notes (Signed)
  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  Estimated Blood Loss:  10 ml  Specimens:  Left lower back lipoma, right lower back lipoma, left upper back lipoma  Complications:  None  Indications for Procedure:  This is a 43 y.o. female with diagnosis of symptomatic lipomas of the lower and upper back.  The patient wishes to have them excised. The risks of bleeding, abscess or infection, injury to surrounding structures, and need for further procedures were all discussed with the patient and she was willing to proceed.  Description of Procedure: The patient was correctly identified at bedside and placed in prone position on the procedure bed.  All three lipomas were marked in location.  The patient's back was prepped and draped in usual sterile fashion.  A 2 cm incision was made over the right lower back lipoma after infiltrating first with local anesthetic.  Skin flaps were created sharply and the mass was excised sharply as well.  It was sent off to pathology.  The cavity was then irrigated and packed for hemostasis.  Another 2 cm incision was made over the left lower back lipoma after infiltrating first with local anesthetic and the mass was excised sharply as well.  The wound was irrigated and packed.  A third 2 cm incision was made in the left upper back after infiltrating first with local anesthetic and a third lipoma was excised sharply.  The wound was irrigated and packed for hemostasis.  Then the packing was removed from the right lower back wound and the wound was closed in two layers using 3-0 Vicryl and 4-0 Monocryl.  The same was done in sequence to the left lower back wound and left  upper back wound.  All three wounds were cleaned and sealed with DermaBond.  The patient tolerated the procedure well and all sharps were appropriately disposed of at the end of the case.   --Patient will follow up in 10 days for wound check --Return precautions given   Melvyn Neth, MD

## 2018-12-13 ENCOUNTER — Encounter: Payer: Self-pay | Admitting: Surgery

## 2018-12-13 ENCOUNTER — Ambulatory Visit (INDEPENDENT_AMBULATORY_CARE_PROVIDER_SITE_OTHER): Payer: BC Managed Care – PPO | Admitting: Surgery

## 2018-12-13 ENCOUNTER — Other Ambulatory Visit: Payer: Self-pay

## 2018-12-13 VITALS — BP 129/95 | HR 74 | Temp 97.7°F | Ht 67.0 in | Wt 240.0 lb

## 2018-12-13 DIAGNOSIS — D171 Benign lipomatous neoplasm of skin and subcutaneous tissue of trunk: Secondary | ICD-10-CM | POA: Diagnosis not present

## 2018-12-13 DIAGNOSIS — Z09 Encounter for follow-up examination after completed treatment for conditions other than malignant neoplasm: Secondary | ICD-10-CM

## 2018-12-13 NOTE — Patient Instructions (Signed)
   Follow-up with our office as needed.  Please call and ask to speak with a nurse if you develop questions or concerns.  

## 2018-12-14 ENCOUNTER — Encounter: Payer: Self-pay | Admitting: Surgery

## 2018-12-14 NOTE — Progress Notes (Signed)
12/14/2018  HPI: Margaret Cuevas is a 43 y.o. female s/p excision of bilateral lower back lipomas and left upper back lipoma on 12/02/18.  Presents today for follow up.  Denies any issues with the incisions, but feels there is a bulge at the wound sites.  Denies any pain, erythema, or drainage.  Vital signs: BP (!) 129/95   Pulse 74   Temp 97.7 F (36.5 C) (Skin)   Ht 5\' 7"  (1.702 m)   Wt 240 lb (108.9 kg)   SpO2 98%   BMI 37.59 kg/m    Physical Exam: Constitutional: No acute distress Skin:  All three incisions are clean, dry, intact, without any evidence of infection.  There is mild firmness at the incisions themselves, which is consistent with healing and scarring.  No other concerns.  Assessment/Plan: This is a 43 y.o. female s/p excision of bilateral lower back and left upper back lipomas.  --Reviewed pathology with patient -- angiofibrolipomas --Patient healing well, reassured that the bulge she's feeling is related to scar tissue and healing and that should continue to improve. --Follow up prn.   Melvyn Neth, [facility]

## 2019-05-24 DIAGNOSIS — Z20822 Contact with and (suspected) exposure to covid-19: Secondary | ICD-10-CM | POA: Diagnosis not present

## 2019-05-29 DIAGNOSIS — J189 Pneumonia, unspecified organism: Secondary | ICD-10-CM | POA: Diagnosis not present

## 2019-12-15 ENCOUNTER — Telehealth: Payer: Self-pay | Admitting: Family Medicine

## 2019-12-15 NOTE — Telephone Encounter (Signed)
estradiol (VIVELLE-DOT) 0.05 MG/24HR patch  busPIRone (BUSPAR) 5 MG tablet   Patient is requesting a call back to discuss the dosage of these medications. She would like to increase the dosage of both. She also inquired if she would be able to start taking Ozympeic.    Please advise.

## 2019-12-19 NOTE — Telephone Encounter (Signed)
Pt called back and wanted to know why she had not been contacted about this issue. Advised pt she needs appt.  Pt verbalized understanding. Pt has scheduled appt.

## 2019-12-27 ENCOUNTER — Ambulatory Visit (INDEPENDENT_AMBULATORY_CARE_PROVIDER_SITE_OTHER): Payer: BC Managed Care – PPO | Admitting: Family Medicine

## 2019-12-27 ENCOUNTER — Encounter: Payer: Self-pay | Admitting: Family Medicine

## 2019-12-27 ENCOUNTER — Other Ambulatory Visit: Payer: Self-pay

## 2019-12-27 VITALS — BP 110/80 | HR 60 | Ht 67.0 in | Wt 245.0 lb

## 2019-12-27 DIAGNOSIS — E669 Obesity, unspecified: Secondary | ICD-10-CM | POA: Diagnosis not present

## 2019-12-27 DIAGNOSIS — N951 Menopausal and female climacteric states: Secondary | ICD-10-CM

## 2019-12-27 DIAGNOSIS — M797 Fibromyalgia: Secondary | ICD-10-CM

## 2019-12-27 DIAGNOSIS — F419 Anxiety disorder, unspecified: Secondary | ICD-10-CM

## 2019-12-27 DIAGNOSIS — F17200 Nicotine dependence, unspecified, uncomplicated: Secondary | ICD-10-CM

## 2019-12-27 MED ORDER — DULOXETINE HCL 30 MG PO CPEP: 30.0000 mg | ORAL_CAPSULE | Freq: Every day | ORAL | 1 refills | Status: DC

## 2019-12-27 MED ORDER — ESTRADIOL 0.05 MG/24HR TD PTTW: MEDICATED_PATCH | TRANSDERMAL | 11 refills | Status: DC

## 2019-12-27 MED ORDER — BUSPIRONE HCL 5 MG PO TABS: ORAL_TABLET | ORAL | 1 refills | Status: DC

## 2019-12-27 NOTE — Patient Instructions (Signed)

## 2019-12-27 NOTE — Progress Notes (Signed)
Date:  12/27/2019   Name:  Margaret Cuevas   DOB:  05/01/1975   MRN:  [id number]   Chief Complaint: Hot Flashes and Anxiety (pt has not been taking med as directed)  Patient is a 43 year old female who presents for a weight gain exam. The patient reports the following problems: anxiety/vasomotor sx/ fibromyalgia. Health maintenance has been reviewed mammogram/  Anxiety Presents for follow-up visit. Symptoms include excessive worry and nervous/anxious behavior. Patient reports no chest pain, compulsions, confusion, decreased concentration, depressed mood, dizziness, dry mouth, feeling of choking, hyperventilation, impotence, insomnia, irritability, malaise, muscle tension, nausea, obsessions, palpitations, panic, restlessness, shortness of breath or suicidal ideas. Symptoms occur occasionally.      Lab Results  Component Value Date   CREATININE 1.03 (H) 05/22/2017   BUN 21 (H) 05/22/2017   NA 143 05/22/2017   K 4.1 05/22/2017   CL 107 05/22/2017   CO2 26 05/22/2017   No results found for: CHOL, HDL, LDLCALC, LDLDIRECT, TRIG, CHOLHDL Lab Results  Component Value Date   TSH 1.480 07/03/2016   No results found for: HGBA1C Lab Results  Component Value Date   WBC 6.7 05/22/2017   HGB 12.2 05/22/2017   HCT 37.0 05/22/2017   MCV 90.2 05/22/2017   PLT 250 05/22/2017   Lab Results  Component Value Date   ALT 16 05/22/2017   AST 23 05/22/2017   ALKPHOS 50 05/22/2017   BILITOT 0.4 05/22/2017     Review of Systems  Constitutional: Positive for unexpected weight change. Negative for chills, fatigue, fever and irritability.  HENT: Negative for congestion, ear discharge, ear pain, rhinorrhea, sinus pressure, sneezing and sore throat.   Eyes: Negative for photophobia, pain, discharge, redness and itching.  Respiratory: Negative for cough, shortness of breath, wheezing and stridor.   Cardiovascular: Negative for chest pain and palpitations.  Gastrointestinal: Negative for abdominal  pain, blood in stool, constipation, diarrhea, nausea and vomiting.  Endocrine: Negative for cold intolerance, heat intolerance, polydipsia, polyphagia and polyuria.  Genitourinary: Negative for dysuria, flank pain, frequency, hematuria, impotence, menstrual problem, pelvic pain, urgency, vaginal bleeding and vaginal discharge.  Musculoskeletal: Negative for arthralgias, back pain and myalgias.  Skin: Negative for rash.  Allergic/Immunologic: Negative for environmental allergies and food allergies.  Neurological: Negative for dizziness, weakness, light-headedness, numbness and headaches.  Hematological: Negative for adenopathy. Does not bruise/bleed easily.  Psychiatric/Behavioral: Negative for confusion, decreased concentration, dysphoric mood and suicidal ideas. The patient is nervous/anxious. The patient does not have insomnia.     Patient Active Problem List   Diagnosis Date Noted  . 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 Fibromyalgia 10/25/2017  . Influenza vaccine needed 01/20/2017    Allergies  Allergen Reactions  . Ibuprofen Other (See Comments)    Other Reaction: Other reaction-swells    Past Surgical History:  Procedure Laterality Date  . ABDOMINAL HYSTERECTOMY    . ANKLE FRACTURE SURGERY     plates and screws placed on both sides  . BACK SURGERY    . CESAREAN SECTION    . CHOLECYSTECTOMY    . EAR TUBE REMOVAL    . GASTRIC BYPASS  2006  . ORIF FINGER / THUMB FRACTURE     screws in thumb    Social History   Tobacco Use  . Smoking status: Current Some Day Smoker    Types: Cigarettes  . Smokeless tobacco: Never Used  Substance Use Topics  . Alcohol use: Yes  . Drug use: No  Medication list has been reviewed and updated.  Current Meds  Medication Sig  . busPIRone (BUSPAR) 5 MG tablet TAKE 1 TABLET(5 MG) BY MOUTH TWICE DAILY  . cetirizine (ZYRTEC) 10 MG tablet Take 1 tablet (10 mg total) by mouth daily.  Marland Kitchen estradiol (VIVELLE-DOT) 0.05 MG/24HR patch UNWRAP AND  APPLY 1 PATCH TO SKIN TWICE PER WEEK  . [DISCONTINUED] tiZANidine (ZANAFLEX) 4 MG tablet Take 1 tablet by mouth at bedtime. [place]    [facility] 2/9 Scores 12/27/2019 11/07/2018 03/17/2018 12/01/2016  PHQ - 2 Score 1 0 3 0  PHQ- 9 Score 7 2 7 6     GAD 7 : Generalized Anxiety Score 12/27/2019 11/07/2018  Nervous, Anxious, on Edge 1 0  Control/stop worrying 0 0  Worry too much - different things 0 0  Trouble relaxing 0 1  Restless 0 1  Easily annoyed or irritable 1 1  Afraid - awful might happen 0 0  Total GAD 7 Score 2 3  Anxiety Difficulty Somewhat difficult Not difficult at all    BP Readings from Last 3 Encounters:  12/27/19 110/80  12/13/18 (!) 129/95  12/02/18 (!) 141/87    Physical Exam Vitals and nursing note reviewed.  Constitutional:      Appearance: She is well-developed.  HENT:     Head: Normocephalic.     Right Ear: Tympanic membrane, ear canal and external ear normal.     Left Ear: Tympanic membrane, ear canal and external ear normal.  Eyes:     General: Lids are everted, no foreign bodies appreciated. No scleral icterus.       Left eye: No foreign body or hordeolum.     Conjunctiva/sclera: Conjunctivae normal.     Right eye: Right conjunctiva is not injected.     Left eye: Left conjunctiva is not injected.     Pupils: Pupils are equal, round, and reactive to light.  Neck:     Thyroid: No thyromegaly.     Vascular: No JVD.     Trachea: No tracheal deviation.  Cardiovascular:     Rate and Rhythm: Normal rate and regular rhythm.     Heart sounds: Normal heart sounds. No murmur heard.  No friction rub. No gallop.   Pulmonary:     Effort: Pulmonary effort is normal. No respiratory distress.     Breath sounds: Normal breath sounds. No wheezing or rales.  Abdominal:     General: Bowel sounds are normal.     Palpations: Abdomen is soft. There is no mass.     Tenderness: There is no abdominal tenderness. There is no guarding or rebound.  Musculoskeletal:        General:  No tenderness. Normal range of motion.     Cervical back: Normal range of motion and neck supple.  Lymphadenopathy:     Cervical: No cervical adenopathy.  Skin:    General: Skin is warm.     Capillary Refill: Capillary refill takes less than 2 seconds.     Findings: No rash.  Neurological:     Mental Status: She is alert and oriented to person, place, and time.     Cranial Nerves: No cranial nerve deficit.     Deep Tendon Reflexes: Reflexes normal.  Psychiatric:        Mood and Affect: Mood is not anxious or depressed.     Wt Readings from Last 3 Encounters:  12/27/19 245 lb (111.1 kg)  12/13/18 240 lb (108.9 kg)  12/02/18 239 lb (108.4  kg)    BP 110/80   Pulse 60   Ht 5\' 7"  (1.702 m)   Wt 245 lb (111.1 kg)   BMI 38.37 kg/m   Assessment and Plan: 1. Anxiety Chronic.  Controlled.  Stable.  PHQ 7 with a gad score of 1 patient only taking her buspirone 5 mg once a day and on as-needed basis a second 1 that she was questioning.  It was explained to her that she needs to take this twice a day for the full effect and patient now understands and will be going to a continuing twice a day dosing.  We will hold on psych referral until patient is able to see effects of twice a day dosing of buspirone. - busPIRone (BUSPAR) 5 MG tablet; TAKE 1 TABLET(5 MG) BY MOUTH TWICE DAILY  Dispense: 180 tablet; Refill: 1  2. Fibromyalgia 1.  Relatively controlled.  Chronic.  Stable.  But patient has not been taking duloxetine that she stopped because of concern for weight gain.  Explained to her that this was doing both issues for depression, fibromyalgia, and may have had an effect with her vasomotor concerns.  Patient will resume but we will taper beginning at 30 mg to go to 60 as needed upon recheck with patient in 3 months.  3. Obesity (BMI 35.0-39.9 without comorbidity) Health risks of being over weight were discussed and patient was counseled on weight loss options and exercise.  Patient was  given information for [facility]er for weight loss in [place].  Patient now understands it is important to count calories and then watch what she eats.  Exercise is important but it is not the only means of losing weight.  4. Vasomotor symptoms due to menopause Patient is status post hysterectomy with possible oophorectomy for PCOS.  Patient is currently on estradiol but is still having symptoms.  Patient will be referred to GYN for further suggestions.  Patient understands that she is going probably at some point time have to discontinue her exogenous estrogen due to her continuance of smoking. - estradiol (VIVELLE-DOT) 0.05 MG/24HR patch; UNWRAP AND APPLY 1 PATCH TO SKIN TWICE PER WEEK  Dispense: 8 patch; Refill: 11 - Ambulatory referral to Gynecology  5. Tobacco dependence Patient has been advised of the health risks of smoking and counseled concerning cessation of tobacco products. I spent over 3 minutes for discussion and to answer questions.

## 2020-03-27 DIAGNOSIS — R232 Flushing: Secondary | ICD-10-CM | POA: Diagnosis not present

## 2020-03-27 DIAGNOSIS — F529 Unspecified sexual dysfunction not due to a substance or known physiological condition: Secondary | ICD-10-CM | POA: Diagnosis not present

## 2020-03-27 DIAGNOSIS — M6289 Other specified disorders of muscle: Secondary | ICD-10-CM | POA: Diagnosis not present

## 2020-03-27 DIAGNOSIS — N898 Other specified noninflammatory disorders of vagina: Secondary | ICD-10-CM | POA: Diagnosis not present

## 2020-04-04 ENCOUNTER — Ambulatory Visit: Payer: BC Managed Care – PPO | Attending: Obstetrics and Gynecology | Admitting: Physical Therapy

## 2020-04-04 ENCOUNTER — Other Ambulatory Visit: Payer: Self-pay

## 2020-04-04 ENCOUNTER — Encounter: Payer: Self-pay | Admitting: Physical Therapy

## 2020-04-04 DIAGNOSIS — M6281 Muscle weakness (generalized): Secondary | ICD-10-CM

## 2020-04-04 DIAGNOSIS — R278 Other lack of coordination: Secondary | ICD-10-CM | POA: Diagnosis not present

## 2020-04-04 NOTE — Therapy (Signed)
[facility] [address] [place], [place], [address] Phone: [phone]   Fax:  [phone]  Physical Therapy Evaluation  Patient Details  Name: Margaret Cuevas MRN: [id number] Date of Birth: June 14, 1975 Referring Provider (PT): Schermerhorn   Encounter Date: 04/04/2020   PT End of Session - 04/04/20 0912    Visit Number 1    Number of Visits 8    Date for PT Re-Evaluation 05/30/20    PT Start Time 0915    PT Stop Time 1000    PT Time Calculation (min) 45 min    Activity Tolerance Patient tolerated treatment well    Behavior During Therapy [facility] for tasks assessed/performed           Past Medical History:  Diagnosis Date  . Fibromyalgia     Past Surgical History:  Procedure Laterality Date  . ABDOMINAL HYSTERECTOMY    . ANKLE FRACTURE SURGERY     plates and screws placed on both sides  . BACK SURGERY    . CESAREAN SECTION    . CHOLECYSTECTOMY    . EAR TUBE REMOVAL    . GASTRIC BYPASS  2006  . ORIF FINGER / THUMB FRACTURE     screws in thumb    There were no vitals filed for this visit.        [facility] PT Assessment - 04/04/20 0001      Assessment   Medical Diagnosis Pelvic Muscle Wasting    Referring Provider (PT) Schermerhorn    Hand Dominance Right    Next MD Visit 04/2019    Prior Therapy None for this dx      Balance Screen   Has the patient fallen in the past 6 months Yes    How many times? 1   Upon turning in the home, patient had spinning sensation which made her off balance and resulted in a fall on her R posterior hip.          PELVIC HEALTH PHYSICAL THERAPY EVALUATION  SCREENING Red Flags: Have you had any night sweats? No Unexplained weight loss? No Saddle anesthesia? Occurs during intercourse Unexplained changes in bowel or bladder habits? No  SUBJECTIVE  Chief Complaint: Patient notes she made the appointment because she was hormone patches which weren't helping. She complains of night  sweats, mood swings, and nagging pain in the pelvis. Patient has this pain during intimacy and at times at rest. Patient explains that the pain is intermittent with sharp stabbing pain on L vaginal wall. Patient notes that pain comes on predominantly in sitting postures and is relieved if she gets up and walks around. Patient does not report pain with external stimulation, but does have pain with vaginal penetration digitally or with penis. Patient does not have pain with initial penetration but has pain when the penis passes the tender spot on L. She notes that once the penis has entered the vagina she does not have any increased pain. Patient will on occasion have numbness with intercourse (at times just prior to climax) when supine with legs elevated.   Pertinent History:  Falls Positive.  Scoliosis Negative. Pulmonary disease/dysfunction Negative. Surgical history: Positive for see above; hx of L4-L5 discectomy.   Obstetrical History: G1P1 Deliveries: c-section Tearing/Episiotomy: n/a Birthing position: n/a  Gynecological History: Hysterectomy: Yes Abdominal Endometriosis: Negative  PCOS: Positive Pain with exam: Yes   Urinary History: Incontinence: Negative.  Fluid Intake: ~64 oz H20, ~24 oz coffee caffeinated Nocturia: 0-1x/night Frequency of  urination: every 3 hours Pain with urination: Negative Difficulty initiating urination: Negative. Intermittent stream: Negative. Frequent UTI: Negative.   Gastrointestinal History: [place] Stool Chart: Type 4 Frequency of BMs: 1x/day Pain with defecation: Positive for back pain (pressure) Straining with defecation: Positive for occasional. Incontinence: Negative  Sexual activity/pain: Pain with intercourse: Positive.   Initial penetration: No  Deep thrustingYes    OBJECTIVE  Mental Status Patient is oriented to person, place and time.  Recent memory is intact.  Remote memory is intact.  Attention span and concentration are  intact.  Expressive speech is intact.  Patient's fund of knowledge is within normal limits for educational level.  POSTURE/OBSERVATIONS:  Sitting: hip IR and hip adduction; slightly increased anterior pelvic tilt  GAIT: Grossly WNL  RANGE OF MOTION: deferred 2/2 to time constraints   LEFT RIGHT  Lumbar forward flexion (65):      Lumbar extension (30):     Lumbar lateral flexion (25):     Thoracic and Lumbar rotation (30 degrees):       Hip Flexion (0-125):      Hip IR (0-45):     Hip ER (0-45):     Hip Abduction (0-40):     Hip extension (0-15):       STRENGTH: MMT deferred 2/2 to time constraints  RLE LLE  Hip Flexion    Hip Extension    Hip Abduction     Hip Adduction     Hip ER     Hip IR     Knee Extension    Knee Flexion    Dorsiflexion     Plantarflexion (seated)     ABDOMINAL: deferred 2/2 to time constraints Palpation: Diastasis: Scar mobility: Rib flare:  SPECIAL TESTS: deferred 2/2 to time constraints  PHYSICAL PERFORMANCE MEASURES: STS: WFL  EXTERNAL PELVIC EXAM: deferred 2/2 to time constraints Palpation: Breath coordination: Cued Lengthen: Cued Contraction: Cough:  INTERNAL VAGINAL EXAM: deferred 2/2 to time constraints Introitus Appears:  Skin integrity:  Scar mobility: Strength (PERF):  Symmetry: Palpation: Prolapse:   INTERNAL RECTAL EXAM: deferred 2/2 to time constraints Strength (PERF): Symmetry: Palpation: Prolapse:   OUTCOME MEASURES: FOTO PFDI Pain 25   ASSESSMENT Patient is a 44 year old presenting to clinic with chief complaints of pelvic pain. Upon examination, patient demonstrates deficits in sensation, PFM coordination, PFM extensibility, and IAP management as evidenced by complaints of diffuse numbness in saddle region of intermittent frequency during intercourse with certain positions, internal pain on L with friction, pelvic pain with prolonged sitting postures. Patient's responses on FOTO outcome measures (PFDI  Pain 25) indicate moderate functional limitations/disability/distress. Patient's progress may be limited due to high pressure/stress career and comorbidities; however, patient's motivation is advantageous. Patient was able to achieve basic understanding of PFM function and pudendal nerve anatomy during today's evaluation and responded positively to educational interventions. Patient will benefit from continued skilled therapeutic intervention to address deficits in sensation, PFM coordination, PFM extensibility, and IAP management in order to increase function and improve overall QOL.  EDUCATION Patient educated on prognosis, POC. Patient articulated understanding and returned demonstration. Patient will benefit from further education in order to maximize compliance and understanding for long-term therapeutic gains.  TREATMENT Neuromuscular Re-education: Patient educated on primary functions of the pelvic floor including: posture/balance, sexual pleasure, storage and elimination of waste from the body, abdominal cavity closure, and breath coordination. Patient educated on pudendal nerve anatomy for improved understanding of possible contributing factors to chief complaints.  Objective measurements completed on examination: See above findings.                    PT Long Term Goals - 04/04/20 1414      PT LONG TERM GOAL #1   Title Patient will demonstrate independence with HEP in order to maximize therapeutic gains and improve carryover from physical therapy sessions to ADLs in the home and community.    Baseline IE: not demonstrated    Time 8    Period Weeks    Status New    Target Date 05/30/20      PT LONG TERM GOAL #2   Title Patient will demonstrate independent and coordinated diaphragmatic breathing in supine with a 1:2 breathing pattern for improved down-regulation of the nervous system and improved management of intra-abdominal pressures in order to increase  function at home and in the community.    Baseline IE: not demonstrated    Time 8    Period Weeks    Status New    Target Date 05/30/20      PT LONG TERM GOAL #3   Title Patient will report being able to return to activities including, but not limited to: gynecological exams and partner intimacy without pain or limitation to indicate complete resolution of the chief complaint and return to prior level of participation at home and in the community.    Baseline IE: unable    Time 8    Period Weeks    Status New    Target Date 05/30/20      PT LONG TERM GOAL #4   Title Patient will demonstrate improved function as evidenced by a score of <10 on FOTO measure for full participation in activities at home and in the community.    Baseline IE: 25    Time 8    Period Weeks    Status New    Target Date 05/30/20                  Plan - 04/04/20 0913    Clinical Impression Statement Patient is a 44 year old presenting to clinic with chief complaints of pelvic pain. Upon examination, patient demonstrates deficits in sensation, PFM coordination, PFM extensibility, and IAP management as evidenced by complaints of diffuse numbness in saddle region of intermittent frequency during intercourse with certain positions, internal pain on L with friction, pelvic pain with prolonged sitting postures. Patient's responses on FOTO outcome measures (PFDI Pain 25) indicate moderate functional limitations/disability/distress. Patient's progress may be limited due to high pressure/stress career and comorbidities; however, patient's motivation is advantageous. Patient was able to achieve basic understanding of PFM function and pudendal nerve anatomy during today's evaluation and responded positively to educational interventions. Patient will benefit from continued skilled therapeutic intervention to address deficits in sensation, PFM coordination, PFM extensibility, and IAP management in order to increase function  and improve overall QOL.    Personal Factors and Comorbidities Age;Comorbidity 3+;Past/Current Experience;Time since onset of injury/illness/exacerbation    Comorbidities fibromyalgia,    Examination-Activity Limitations Sit;Other    Examination-Participation Restrictions Interpersonal Relationship    Stability/Clinical Decision Making Evolving/Moderate complexity    Clinical Decision Making Moderate    Rehab Potential Good    PT Frequency 1x / week    PT Duration 8 weeks    PT Treatment/Interventions Joint Manipulations;Spinal Manipulations;Taping;Dry needling;Manual techniques;Patient/family education;Neuromuscular re-education;Therapeutic activities;Therapeutic exercise;Moist Heat;Cryotherapy;ADLs/Self Care Home Management;Biofeedback;Electrical Stimulation    PT Next Visit Plan physical assessment  PT Home Exercise Plan toileting posture for decreased straining with defecation    Consulted and Agree with Plan of Care Patient           Patient will benefit from skilled therapeutic intervention in order to improve the following deficits and impairments:  Increased muscle spasms,Impaired sensation,Pain,Postural dysfunction,Improper body mechanics,Obesity,Impaired flexibility,Decreased coordination  Visit Diagnosis: Muscle weakness (generalized)  Other lack of coordination     Problem List Patient Active Problem List   Diagnosis Date Noted  . 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 Fibromyalgia 10/25/2017  . Influenza vaccine needed 01/20/2017   Myles Gip PT, DPT [phone]  04/04/2020, 2:16 PM  [facility] [address] [place], [place], [address] Phone: [phone]   Fax:  [phone]  Name: Nolie Bignell MRN: [id number] Date of Birth: [birth year]

## 2020-04-11 ENCOUNTER — Encounter: Payer: Self-pay | Admitting: Physical Therapy

## 2020-04-11 ENCOUNTER — Ambulatory Visit: Payer: BC Managed Care – PPO | Admitting: Physical Therapy

## 2020-04-11 ENCOUNTER — Other Ambulatory Visit: Payer: Self-pay

## 2020-04-11 DIAGNOSIS — M6281 Muscle weakness (generalized): Secondary | ICD-10-CM | POA: Diagnosis not present

## 2020-04-11 DIAGNOSIS — R278 Other lack of coordination: Secondary | ICD-10-CM

## 2020-04-11 NOTE — Therapy (Signed)
[facility] [facility] [address] [place], [place], [address] Phone: [phone]   Fax:  [phone]  Physical Therapy Treatment  Patient Details  Name: Margaret Cuevas MRN: [id number] Date of Birth: 05-05-1975 Referring Provider (PT): Schermerhorn   Encounter Date: 04/11/2020   PT End of Session - 04/11/20 0910    Visit Number 2    Number of Visits 8    Date for PT Re-Evaluation 05/30/20    PT Start Time 0900    PT Stop Time 0955    PT Time Calculation (min) 55 min    Activity Tolerance Patient tolerated treatment well    Behavior During Therapy [facility] for tasks assessed/performed           Past Medical History:  Diagnosis Date  . Fibromyalgia     Past Surgical History:  Procedure Laterality Date  . ABDOMINAL HYSTERECTOMY    . ANKLE FRACTURE SURGERY     plates and screws placed on both sides  . BACK SURGERY    . CESAREAN SECTION    . CHOLECYSTECTOMY    . EAR TUBE REMOVAL    . GASTRIC BYPASS  2006  . ORIF FINGER / THUMB FRACTURE     screws in thumb    There were no vitals filed for this visit.   Subjective Assessment - 04/11/20 0902    Subjective Patient reports that she thought that her pain was in sitting or sexual positioning, but she noticed that during packing up her kitchen for moving she was hit by the pain. The pain was wrapping around from the lower spine but ended in the vaginal canal. Patient took a break in her recliner and took ibuprofen which helped the pain resolve enough for her to continue with packing for her move.    Currently in Pain? Yes    Pain Score 6     Pain Location Back    Pain Orientation Lower           TREATMENT  Pre-treatment assessment: RANGE OF MOTION:    LEFT RIGHT  Lumbar forward flexion (65):  Limited, overpressure relieving    Lumbar extension (30): WNL painful    Lumbar lateral flexion (25):  WNL 75% and painful  Thoracic and Lumbar rotation (30 degrees):    WNL WNL     STRENGTH: MMT   RLE LLE  Hip Flexion 5 5  Hip Extension 5 5  Hip Abduction  5 5  Hip Adduction  5 5  Hip ER  5 5  Hip IR  5 5  Knee Extension 5 5  Knee Flexion 5 5  Dorsiflexion  5 5  Plantarflexion (seated) 5 5    Manual Therapy: STM and TPR performed to R [place] to allow for decreased tension and pain and improved posture and function Sacral border mobilizations for decreased pain and improved mobility, grade II/III R sidelying lumbar gapping for improved neural mobility and decreased pain  Neuromuscular Re-education: Supine single knee to chest with diaphragmatic breath for improved lumbar mobility and decreased pain, BLE Supine double knee to chest with diaphragmatic breath for improved lumbar mobility and decreased pain Supine hooklying diaphragmatic breathing with VCs and TCs for downregulation of the nervous system and improved management of IAP Supine hooklying trunk rotations with coordinated breath for improved lumbar mobility and decreased pain Supine pelvic tilts with coordinated breath for improved lumbar mobility and decreased pain   Patient educated throughout session on appropriate technique and form  using multi-modal cueing, HEP, and activity modification. Patient articulated understanding and returned demonstration.  Patient Response to interventions: With increased lumbar mobility activities, patient had increased frequency of pelvic pain.  ASSESSMENT Patient presents to clinic with excellent motivation to participate in therapy. Patient demonstrates deficits in sensation, PFM coordination, PFM extensibility, and IAP management. Patient able to achieve pain relief with lumbar gapping during today's session and responded positively to educational and active interventions. Patient will benefit from continued skilled therapeutic intervention to address remaining deficits in sensation, PFM coordination, PFM extensibility, and IAP management in order to  increase function and improve overall QOL.     PT Long Term Goals - 04/04/20 1414      PT LONG TERM GOAL #1   Title Patient will demonstrate independence with HEP in order to maximize therapeutic gains and improve carryover from physical therapy sessions to ADLs in the home and community.    Baseline IE: not demonstrated    Time 8    Period Weeks    Status New    Target Date 05/30/20      PT LONG TERM GOAL #2   Title Patient will demonstrate independent and coordinated diaphragmatic breathing in supine with a 1:2 breathing pattern for improved down-regulation of the nervous system and improved management of intra-abdominal pressures in order to increase function at home and in the community.    Baseline IE: not demonstrated    Time 8    Period Weeks    Status New    Target Date 05/30/20      PT LONG TERM GOAL #3   Title Patient will report being able to return to activities including, but not limited to: gynecological exams and partner intimacy without pain or limitation to indicate complete resolution of the chief complaint and return to prior level of participation at home and in the community.    Baseline IE: unable    Time 8    Period Weeks    Status New    Target Date 05/30/20      PT LONG TERM GOAL #4   Title Patient will demonstrate improved function as evidenced by a score of <10 on FOTO measure for full participation in activities at home and in the community.    Baseline IE: 25    Time 8    Period Weeks    Status New    Target Date 05/30/20                 Plan - 04/11/20 0910    Clinical Impression Statement Patient presents to clinic with excellent motivation to participate in therapy. Patient demonstrates deficits in sensation, PFM coordination, PFM extensibility, and IAP management. Patient able to achieve pain relief with lumbar gapping during today's session and responded positively to educational and active interventions. Patient will benefit from  continued skilled therapeutic intervention to address remaining deficits in sensation, PFM coordination, PFM extensibility, and IAP management in order to increase function and improve overall QOL.    Personal Factors and Comorbidities Age;Comorbidity 3+;Past/Current Experience;Time since onset of injury/illness/exacerbation    Comorbidities fibromyalgia,    Examination-Activity Limitations Sit;Other    Examination-Participation Restrictions Interpersonal Relationship    Stability/Clinical Decision Making Evolving/Moderate complexity    Rehab Potential Good    PT Frequency 1x / week    PT Duration 8 weeks    PT Treatment/Interventions Joint Manipulations;Spinal Manipulations;Taping;Dry needling;Manual techniques;Patient/family education;Neuromuscular re-education;Therapeutic activities;Therapeutic exercise;Moist Heat;Cryotherapy;ADLs/Self Care Home Management;Biofeedback;Electrical Stimulation    PT Next Visit  Plan physical assessment    PT Home Exercise Plan toileting posture for decreased straining with defecation    Consulted and Agree with Plan of Care Patient           Patient will benefit from skilled therapeutic intervention in order to improve the following deficits and impairments:  Increased muscle spasms,Impaired sensation,Pain,Postural dysfunction,Improper body mechanics,Obesity,Impaired flexibility,Decreased coordination  Visit Diagnosis: Muscle weakness (generalized)  Other lack of coordination     Problem List Patient Active Problem List   Diagnosis Date Noted  . 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 Fibromyalgia 10/25/2017  . Influenza vaccine needed 01/20/2017   Myles Gip PT, DPT [phone]  04/11/2020, 2:38 PM  [place] [address] [place], [place], [address] Phone: [phone]   Fax:  [phone]  Name: Margaret Cuevas MRN: [id number] Date of Birth: 1976-03-01

## 2020-04-18 ENCOUNTER — Ambulatory Visit: Payer: BC Managed Care – PPO | Admitting: Physical Therapy

## 2020-04-18 ENCOUNTER — Ambulatory Visit: Payer: Self-pay | Admitting: *Deleted

## 2020-04-18 NOTE — Telephone Encounter (Signed)
Patient is calling to report she is under a lot of stress- job, selling/buying house, [employer]- she is working short in [place] today. Patient states all the stress has caused her fibromyalgia to flare and it is cause the muscles in her neck to knot. Patient states the knots in neck are so bad they are causing pain in her head. Patient is requesting a muscle relaxer for her neck to help with the headache. Patient states she is unable to come to office today due to work. Patient advised I would send her request- but if her PCP can not prescribe her medication today- she may want to be seen at [facility] before the weekend.  Reason for Disposition . [1] MODERATE headache (e.g., interferes with normal activities) AND [2] present > 24 hours AND [3] unexplained  (Exceptions: analgesics not tried, typical migraine, or headache part of viral illness)  Answer Assessment - Initial Assessment Questions 1. LOCATION: "Where does it hurt?"      Neck- fibromyalgia is in overdrive- causing to cause pain into head 2. ONSET: "When did the headache start?" (Minutes, hours or days)      Started 2 weeks ago 3. PATTERN: "Does the pain come and go, or has it been constant since it started?"     constant 4. SEVERITY: "How bad is the pain?" and "What does it keep you from doing?"  (e.g., Scale 1-10; mild, moderate, or severe)   - MILD (1-3): doesn't interfere with normal activities    - MODERATE (4-7): interferes with normal activities or awakens from sleep    - SEVERE (8-10): excruciating pain, unable to do any normal activities        Moderate to severe 5. RECURRENT SYMPTOM: "Have you ever had headaches before?" If Yes, ask: "When was the last time?" and "What happened that time?"      no 6. CAUSE: "What do you think is causing the headache?"     Stress- causing knots in neck and causing pain in head 7. MIGRAINE: "Have you been diagnosed with migraine headaches?" If Yes, ask: "Is this headache similar?"      no 8. HEAD  INJURY: "Has there been any recent injury to the head?"      no 9. OTHER SYMPTOMS: "Do you have any other symptoms?" (fever, stiff neck, eye pain, sore throat, cold symptoms)     Neck pain- muscles in neck are tight 10. PREGNANCY: "Is there any chance you are pregnant?" "When was your last menstrual period?"       no  Protocols used: HEADACHE-A-AH

## 2020-04-21 ENCOUNTER — Other Ambulatory Visit: Payer: Self-pay

## 2020-04-21 ENCOUNTER — Emergency Department: Payer: BC Managed Care – PPO

## 2020-04-21 ENCOUNTER — Emergency Department
Admission: EM | Admit: 2020-04-21 | Discharge: 2020-04-21 | Disposition: A | Discharge status: Home / Self Care | Payer: BC Managed Care – PPO | Attending: Emergency Medicine | Admitting: Emergency Medicine

## 2020-04-21 ENCOUNTER — Encounter: Payer: Self-pay | Admitting: Emergency Medicine

## 2020-04-21 DIAGNOSIS — R519 Headache, unspecified: Secondary | ICD-10-CM | POA: Diagnosis not present

## 2020-04-21 DIAGNOSIS — F17210 Nicotine dependence, cigarettes, uncomplicated: Secondary | ICD-10-CM | POA: Diagnosis not present

## 2020-04-21 DIAGNOSIS — M5481 Occipital neuralgia: Secondary | ICD-10-CM | POA: Diagnosis not present

## 2020-04-21 DIAGNOSIS — Z20822 Contact with and (suspected) exposure to covid-19: Secondary | ICD-10-CM | POA: Insufficient documentation

## 2020-04-21 LAB — CBC WITH DIFFERENTIAL/PLATELET
Abs Immature Granulocytes: 0.01 10*3/uL (ref 0.00–0.07)
Basophils Absolute: 0 10*3/uL (ref 0.0–0.1)
Basophils Relative: 0 %
Eosinophils Absolute: 0.2 10*3/uL (ref 0.0–0.5)
Eosinophils Relative: 3 %
HCT: 33.6 % — ABNORMAL LOW (ref 36.0–46.0)
Hemoglobin: 10.7 g/dL — ABNORMAL LOW (ref 12.0–15.0)
Immature Granulocytes: 0 %
Lymphocytes Relative: 37 %
Lymphs Abs: 2.7 10*3/uL (ref 0.7–4.0)
MCH: 28.5 pg (ref 26.0–34.0)
MCHC: 31.8 g/dL (ref 30.0–36.0)
MCV: 89.4 fL (ref 80.0–100.0)
Monocytes Absolute: 0.4 10*3/uL (ref 0.1–1.0)
Monocytes Relative: 6 %
Neutro Abs: 3.9 10*3/uL (ref 1.7–7.7)
Neutrophils Relative %: 54 %
Platelets: 275 10*3/uL (ref 150–400)
RBC: 3.76 MIL/uL — ABNORMAL LOW (ref 3.87–5.11)
RDW: 15.5 % (ref 11.5–15.5)
WBC: 7.2 10*3/uL (ref 4.0–10.5)
nRBC: 0 % (ref 0.0–0.2)

## 2020-04-21 LAB — BASIC METABOLIC PANEL
Anion gap: 8 (ref 5–15)
BUN: 19 mg/dL (ref 6–20)
CO2: 29 mmol/L (ref 22–32)
Calcium: 9 mg/dL (ref 8.9–10.3)
Chloride: 102 mmol/L (ref 98–111)
Creatinine, Ser: 0.94 mg/dL (ref 0.44–1.00)
GFR, Estimated: >60 mL/min (ref >60)
Glucose, Bld: 107 mg/dL — ABNORMAL HIGH (ref 70–99)
Potassium: 4.3 mmol/L (ref 3.5–5.1)
Sodium: 139 mmol/L (ref 135–145)

## 2020-04-21 LAB — RESP PANEL BY RT-PCR (FLU A&B, COVID) ARPGX2
Influenza A by PCR: NEGATIVE
Influenza B by PCR: NEGATIVE
SARS Coronavirus 2 by RT PCR: NEGATIVE

## 2020-04-21 MED ORDER — TRAMADOL HCL 50 MG PO TABS: 50.0000 mg | ORAL_TABLET | Freq: Four times a day (QID) | ORAL | 0 refills | Status: DC | PRN

## 2020-04-21 MED ORDER — CYCLOBENZAPRINE HCL 10 MG PO TABS: 10.0000 mg | ORAL_TABLET | Freq: Three times a day (TID) | ORAL | 0 refills | Status: DC | PRN

## 2020-04-21 MED ORDER — ONDANSETRON HCL 4 MG/2ML IJ SOLN
4.0000 mg | Freq: Once | INTRAMUSCULAR | Status: AC
  Administered 2020-04-21: 4 mg via INTRAVENOUS
  Filled 2020-04-21: qty 2

## 2020-04-21 MED ORDER — SODIUM CHLORIDE 0.9 % IV BOLUS
1000.0000 mL | Freq: Once | INTRAVENOUS | Status: AC
  Administered 2020-04-21: 1000 mL via INTRAVENOUS

## 2020-04-21 MED ORDER — METHYLPREDNISOLONE 4 MG PO TBPK: ORAL_TABLET | ORAL | 0 refills | Status: DC

## 2020-04-21 MED ORDER — DEXAMETHASONE SODIUM PHOSPHATE 10 MG/ML IJ SOLN
10.0000 mg | Freq: Once | INTRAMUSCULAR | Status: AC
  Administered 2020-04-21: 10 mg via INTRAVENOUS
  Filled 2020-04-21: qty 1

## 2020-04-21 NOTE — Discharge Instructions (Addendum)
Follow-up with your regular doctor if not improving in 2 days.  Return emergency department if worsening.  Take medications as prescribed

## 2020-04-21 NOTE — ED Triage Notes (Signed)
Pt reports has sinus pressure and pain to her face. Pt also reports pain to her right ear and knots on her neck. Pt reports this am she tried to massage the knots out but it made them worse and the pain got worse.

## 2020-04-21 NOTE — ED Provider Notes (Signed)
[facility] Emergency Department Provider Note  ____________________________________________   Event Date/Time   First MD Initiated Contact with Patient 04/21/20 1641     (approximate)  I have reviewed the triage vital signs and the nursing notes.   HISTORY  Chief Complaint Facial Pain, knots on neck, and Otalgia    HPI Margaret Cuevas is a 44 y.o. female presents emergency department with a headache for 2+ weeks.  Patient states she has severe pain radiating up the back of the head to the front.  History of fibromyalgia.  She did take a muscle relaxer which did not help.  She states that she has had some sinus congestion.  Feels like she has drainage in her throat.  No fever or chills.  Patient is vaccinated for Covid  patient also had a case of vertigo/dizziness while waiting in her room to be examined.   Past Medical History:  Diagnosis Date  . Fibromyalgia     Patient Active Problem List   Diagnosis Date Noted  . 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 Fibromyalgia 10/25/2017  . Influenza vaccine needed 01/20/2017    Past Surgical History:  Procedure Laterality Date  . ABDOMINAL HYSTERECTOMY    . ANKLE FRACTURE SURGERY     plates and screws placed on both sides  . BACK SURGERY    . CESAREAN SECTION    . CHOLECYSTECTOMY    . EAR TUBE REMOVAL    . GASTRIC BYPASS  2006  . ORIF FINGER / THUMB FRACTURE     screws in thumb    Prior to Admission medications   Medication Sig Start Date End Date Taking? Authorizing Provider  busPIRone (BUSPAR) 5 MG tablet TAKE 1 TABLET(5 MG) BY MOUTH TWICE DAILY 12/27/19   Juline Patch, MD  cetirizine (ZYRTEC) 10 MG tablet Take 1 tablet (10 mg total) by mouth daily. 11/07/18   Juline Patch, MD  cyclobenzaprine (FLEXERIL) 10 MG tablet Take 1 tablet (10 mg total) by mouth 3 (three) times daily as needed. 04/21/20   Sebastian Lurz, Linden Dolin, PA-C  DULoxetine (CYMBALTA) 30 MG capsule Take 1 capsule (30 mg total) by mouth daily.  12/27/19   Juline Patch, MD  estradiol (VIVELLE-DOT) 0.05 MG/24HR patch UNWRAP AND APPLY 1 PATCH TO SKIN TWICE PER WEEK 12/27/19   Juline Patch, MD  methylPREDNISolone (MEDROL DOSEPAK) 4 MG TBPK tablet Take 6 pills on day one then decrease by 1 pill each day 04/21/20   Versie Starks, PA-C  traMADol (ULTRAM) 50 MG tablet Take 1 tablet (50 mg total) by mouth every 6 (six) hours as needed. 04/21/20   Versie Starks, PA-C    Allergies Ibuprofen  Family History  Problem Relation Age of Onset  . Diabetes Mother   . Cancer Maternal Grandmother   . Diabetes Maternal Grandmother   . Hypertension Maternal Grandmother   . Cancer Paternal Grandfather   . Hypertension Father   . Hypertension Paternal Grandmother     Social History Social History   Tobacco Use  . Smoking status: Current Some Day Smoker    Types: Cigarettes  . Smokeless tobacco: Never Used  Substance Use Topics  . Alcohol use: Yes  . Drug use: No    Review of Systems  Constitutional: No fever/chills, positive headache and dizziness Eyes: No visual changes. ENT: No sore throat. Respiratory: Denies cough Cardiovascular: Denies chest pain Gastrointestinal: Denies abdominal pain Genitourinary: Negative for dysuria. Musculoskeletal: Negative for back pain. Skin: Negative  for rash. Psychiatric: no mood changes,     ____________________________________________   PHYSICAL EXAM:  VITAL SIGNS: ED Triage Vitals [04/21/20 1411]  Enc Vitals Group     BP (!) 133/104     Pulse Rate 68     Resp 20     Temp 98.4 F (36.9 C)     Temp Source Oral     SpO2 96 %     Weight 238 lb (108 kg)     Height 5\' 6"  (1.676 m)     Head Circumference      Peak Flow      Pain Score 10     Pain Loc      Pain Edu?      Excl. in [place]?     Constitutional: Alert and oriented. Well appearing and in no acute distress. Eyes: Conjunctivae are normal.  EOMI shows nystagmus, PERRL Head: Atraumatic.  Posterior scalp is tender to  palpation Nose: No congestion/rhinnorhea. Mouth/Throat: Mucous membranes are moist.   Neck:  supple no lymphadenopathy noted Cardiovascular: Normal rate, regular rhythm. Heart sounds are normal Respiratory: Normal respiratory effort.  No retractions, lungs c t a  GU: deferred Musculoskeletal: FROM all extremities, warm and well perfused Neurologic:  Normal speech and language.  Cranial nerves II through XII grossly intact Skin:  Skin is warm, dry and intact. No rash noted. Psychiatric: Mood and affect are normal. Speech and behavior are normal.  ____________________________________________   LABS (all labs ordered are listed, but only abnormal results are displayed)  Labs Reviewed  CBC WITH DIFFERENTIAL/PLATELET - Abnormal; Notable for the following components:      Result Value   RBC 3.76 (*)    Hemoglobin 10.7 (*)    HCT 33.6 (*)    All other components within normal limits  BASIC METABOLIC PANEL - Abnormal; Notable for the following components:   Glucose, Bld 107 (*)    All other components within normal limits  RESP PANEL BY RT-PCR (FLU A&B, COVID) [id number]   ____________________________________________   ____________________________________________  RADIOLOGY  CT of the head  ____________________________________________   PROCEDURES  Procedure(s) performed: No  Procedures    ____________________________________________   INITIAL IMPRESSION / ASSESSMENT AND PLAN / ED COURSE  Pertinent labs & imaging results that were available during my care of the patient were reviewed by me and considered in my medical decision making (see chart for details).   Patient is 44 year old female presents with severe headache.  See HPI.  Physical exam shows patient to appear stable.  Posterior scalp is slightly tender.  Since patient is vaccinated for Covid I do have concerns that she could be having Covid symptoms without fever or cough.  Respiratory panel was ordered  CT of  the head due to the 2-week history of migraine type headaches that are unrelieved with over-the-counter pain medications.  CBC and metabolic panel   CBC shows mild anemia, metabolic panel is normal, CT of the head is normal  I did explain all the findings to the patient.  Her Covid test was also negative. I feel that she has more of an occipital neuralgia due to muscle spasms in her neck.  She was given Decadron while here in the ED along with her normal saline.  Patient is driving so cannot give her narcotic here for pain.  She was given a prescription for Medrol Dosepak, Flexeril, and tramadol for pain that is not controlled by Tylenol or ibuprofen.  She is to follow-up with  her regular doctor.  Return if worsening.  Sakoya Hinkel was evaluated in Emergency Department on 04/21/2020 for the symptoms described in the history of present illness. She was evaluated in the context of the global COVID-19 pandemic, which necessitated consideration that the patient might be at risk for infection with the SARS-CoV-2 virus that causes COVID-19. Institutional protocols and algorithms that pertain to the evaluation of patients at risk for COVID-19 are in a state of rapid change based on information released by regulatory bodies including the CDC and federal and state organizations. These policies and algorithms were followed during the patient's care in the ED.    As part of my medical decision making, I reviewed the following data within the [place] notes reviewed and incorporated, Labs reviewed , Old chart reviewed, Notes from prior ED visits and [place] Controlled Substance Database  ____________________________________________   FINAL CLINICAL IMPRESSION(S) / ED DIAGNOSES  Final diagnoses:  Occipital neuralgia of right side      NEW MEDICATIONS STARTED DURING THIS VISIT:  New Prescriptions   CYCLOBENZAPRINE (FLEXERIL) 10 MG TABLET    Take 1 tablet (10 mg total) by mouth 3  (three) times daily as needed.   METHYLPREDNISOLONE (MEDROL DOSEPAK) 4 MG TBPK TABLET    Take 6 pills on day one then decrease by 1 pill each day   TRAMADOL (ULTRAM) 50 MG TABLET    Take 1 tablet (50 mg total) by mouth every 6 (six) hours as needed.     Note:  This document was prepared using Dragon voice recognition software and may include unintentional dictation errors.    Versie Starks, PA-C 04/21/20 1850    Carrie Mew, MD 04/22/20 1254

## 2020-04-21 NOTE — ED Notes (Signed)
Pt reports she started with headache 2+ weeks ago - Pt c/o pressure to right side of head - Pt reports "knots in back of neck" - She states that when the knots are massaged her head hurts worse - Pt reports head congestion and thick feeling in throat - Pt thought it was fibromyalgia

## 2020-04-25 ENCOUNTER — Other Ambulatory Visit: Payer: Self-pay

## 2020-04-25 ENCOUNTER — Ambulatory Visit: Payer: BC Managed Care – PPO | Admitting: Physical Therapy

## 2020-04-25 ENCOUNTER — Encounter: Payer: Self-pay | Admitting: Physical Therapy

## 2020-04-25 DIAGNOSIS — M6281 Muscle weakness (generalized): Secondary | ICD-10-CM

## 2020-04-25 DIAGNOSIS — R278 Other lack of coordination: Secondary | ICD-10-CM | POA: Diagnosis not present

## 2020-04-25 NOTE — Therapy (Signed)
[facility] [address] [place], [place], [address] Phone: [phone]   Fax:  [phone]  Physical Therapy Treatment  Patient Details  Name: Margaret Cuevas MRN: [id number] Date of Birth: 10-Apr-1976 Referring Provider (PT): Schermerhorn   Encounter Date: 04/25/2020   PT End of Session - 04/25/20 1216    Visit Number 3    Number of Visits 8    Date for PT Re-Evaluation 05/30/20    PT Start Time 0900    PT Stop Time 0955    PT Time Calculation (min) 55 min    Activity Tolerance Patient tolerated treatment well    Behavior During Therapy [facility] for tasks assessed/performed           Past Medical History:  Diagnosis Date  . Fibromyalgia     Past Surgical History:  Procedure Laterality Date  . ABDOMINAL HYSTERECTOMY    . ANKLE FRACTURE SURGERY     plates and screws placed on both sides  . BACK SURGERY    . CESAREAN SECTION    . CHOLECYSTECTOMY    . EAR TUBE REMOVAL    . GASTRIC BYPASS  2006  . ORIF FINGER / THUMB FRACTURE     screws in thumb    There were no vitals filed for this visit.   Subjective Assessment - 04/25/20 0903    Subjective Patient notes that she continues to have pain in the R side of her neck, base of skull with radiating pain to the front and side of the skull. She notes that she has been taking the medicine prescribed in UC/ED which has helped with pain some but has not compeltely resolved the pain. Patient also states that her work continues to be a large source of stress and she does not have the option to take time away from work now.    Currently in Pain? Yes    Pain Score 7     Pain Location Head    Pain Orientation Posterior;Right          TREATMENT Neuromuscular Re-education: Supine hooklying diaphragmatic breathing with VCs and TCs for downregulation of the nervous system and improved management of IAP Supine hooklying trunk rotations with coordinated breath for improved lumbar mobility  and decreased pain Supine pelvic tilts with coordinated breath for improved lumbar mobility and decreased pain Patient education on body scan pain coping skill for improved pain modulation. Patient guided through body scan meditation by PT.   Patient educated throughout session on appropriate technique and form using multi-modal cueing, HEP, and activity modification. Patient articulated understanding and returned demonstration.  Patient Response to interventions: 5/10  ASSESSMENT Patient presents to clinic with excellent motivation to participate in therapy. Patient demonstrates deficits in sensation, PFM coordination, PFM extensibility, and IAP management. Patient able to achieve pain relief with guided body scan meditation during today's session and responded positively to educational and active interventions. Patient will benefit from continued skilled therapeutic intervention to address remaining deficits in sensation, PFM coordination, PFM extensibility, and IAP management in order to increase function and improve overall QOL.    PT Long Term Goals - 04/04/20 1414      PT LONG TERM GOAL #1   Title Patient will demonstrate independence with HEP in order to maximize therapeutic gains and improve carryover from physical therapy sessions to ADLs in the home and community.    Baseline IE: not demonstrated    Time 8    Period Weeks  Status New    Target Date 05/30/20      PT LONG TERM GOAL #2   Title Patient will demonstrate independent and coordinated diaphragmatic breathing in supine with a 1:2 breathing pattern for improved down-regulation of the nervous system and improved management of intra-abdominal pressures in order to increase function at home and in the community.    Baseline IE: not demonstrated    Time 8    Period Weeks    Status New    Target Date 05/30/20      PT LONG TERM GOAL #3   Title Patient will report being able to return to activities including, but not  limited to: gynecological exams and partner intimacy without pain or limitation to indicate complete resolution of the chief complaint and return to prior level of participation at home and in the community.    Baseline IE: unable    Time 8    Period Weeks    Status New    Target Date 05/30/20      PT LONG TERM GOAL #4   Title Patient will demonstrate improved function as evidenced by a score of <10 on FOTO measure for full participation in activities at home and in the community.    Baseline IE: 25    Time 8    Period Weeks    Status New    Target Date 05/30/20                 Plan - 04/25/20 1217    Clinical Impression Statement Patient presents to clinic with excellent motivation to participate in therapy. Patient demonstrates deficits in sensation, PFM coordination, PFM extensibility, and IAP management. Patient able to achieve pain relief with guided body scan meditation during today's session and responded positively to educational and active interventions. Patient will benefit from continued skilled therapeutic intervention to address remaining deficits in sensation, PFM coordination, PFM extensibility, and IAP management in order to increase function and improve overall QOL.    Personal Factors and Comorbidities Age;Comorbidity 3+;Past/Current Experience;Time since onset of injury/illness/exacerbation    Comorbidities fibromyalgia,    Examination-Activity Limitations Sit;Other    Examination-Participation Restrictions Interpersonal Relationship    Stability/Clinical Decision Making Evolving/Moderate complexity    Rehab Potential Good    PT Frequency 1x / week    PT Duration 8 weeks    PT Treatment/Interventions Joint Manipulations;Spinal Manipulations;Taping;Dry needling;Manual techniques;Patient/family education;Neuromuscular re-education;Therapeutic activities;Therapeutic exercise;Moist Heat;Cryotherapy;ADLs/Self Care Home Management;Biofeedback;Electrical Stimulation     PT Next Visit Plan nervous system downtraining    PT Home Exercise Plan toileting posture for decreased straining with defecation; body scan    Consulted and Agree with Plan of Care Patient           Patient will benefit from skilled therapeutic intervention in order to improve the following deficits and impairments:  Increased muscle spasms,Impaired sensation,Pain,Postural dysfunction,Improper body mechanics,Obesity,Impaired flexibility,Decreased coordination  Visit Diagnosis: Muscle weakness (generalized)  Other lack of coordination     Problem List Patient Active Problem List   Diagnosis Date Noted  . 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 Fibromyalgia 10/25/2017  . Influenza vaccine needed 01/20/2017   Sheria Lang PT, DPT [phone]  04/25/2020, 12:22 PM  [facility] [address] [place], [place], [address] Phone: [phone]   Fax:  [phone]  Name: Ashante Yellin MRN: [id number] Date of Birth: 1976/02/17

## 2020-05-02 ENCOUNTER — Ambulatory Visit: Payer: BC Managed Care – PPO | Attending: Obstetrics and Gynecology | Admitting: Physical Therapy

## 2020-05-02 DIAGNOSIS — M6281 Muscle weakness (generalized): Secondary | ICD-10-CM | POA: Insufficient documentation

## 2020-05-02 DIAGNOSIS — R278 Other lack of coordination: Secondary | ICD-10-CM | POA: Insufficient documentation

## 2020-05-09 ENCOUNTER — Encounter: Payer: Self-pay | Admitting: Physical Therapy

## 2020-05-09 ENCOUNTER — Other Ambulatory Visit: Payer: Self-pay

## 2020-05-09 ENCOUNTER — Ambulatory Visit: Payer: BC Managed Care – PPO | Admitting: Physical Therapy

## 2020-05-09 DIAGNOSIS — M6281 Muscle weakness (generalized): Secondary | ICD-10-CM

## 2020-05-09 DIAGNOSIS — R278 Other lack of coordination: Secondary | ICD-10-CM | POA: Diagnosis not present

## 2020-05-09 NOTE — Therapy (Signed)
[facility] [address] [place], [place], [address] Phone: [phone]   Fax:  [phone]  Physical Therapy Treatment  Patient Details  Name: Margaret Cuevas MRN: [id number] Date of Birth: 08/31/1975 Referring Provider (PT): Schermerhorn   Encounter Date: 05/09/2020   PT End of Session - 05/09/20 0912    Visit Number 4    Number of Visits 8    Date for PT Re-Evaluation 05/30/20    PT Start Time 0905    PT Stop Time 0950    PT Time Calculation (min) 45 min    Activity Tolerance Patient tolerated treatment well;Patient limited by pain    Behavior During Therapy [facility] for tasks assessed/performed           Past Medical History:  Diagnosis Date  . Fibromyalgia     Past Surgical History:  Procedure Laterality Date  . ABDOMINAL HYSTERECTOMY    . ANKLE FRACTURE SURGERY     plates and screws placed on both sides  . BACK SURGERY    . CESAREAN SECTION    . CHOLECYSTECTOMY    . EAR TUBE REMOVAL    . GASTRIC BYPASS  2006  . ORIF FINGER / THUMB FRACTURE     screws in thumb    There were no vitals filed for this visit.   Subjective Assessment - 05/09/20 0905    Subjective Patient notes last week she was sick and missed her appointment. She reports life continues to be very busy with work and moving. Patient reports her headache has been off and on; she feels good today but yesterday her headache was very intense. Patient also notes tons of back pain. She reports her vaginal pain continues but is less frequent with same intensity. Patient notes that numbness has stayed resolved but she did have some during intercourse that was brief and dissipated. She presently has back pain on L at L4/5 level.    Currently in Pain? Yes    Pain Score 5     Pain Location Back    Pain Orientation Left;Lower           TREATMENT Neuromuscular Re-education: Supine hooklying diaphragmatic breathing with VCs and TCs for downregulation of the  nervous system and improved management of IAP Supine legs up wall with diaphragmatic breath, progressive straightening of legs with addition of unilateral abduction as tolerated for increased mobilization of pudendal nerve.  Patient education on graded exposure to pudendal nerve mobilizations.  Patient educated throughout session on appropriate technique and form using multi-modal cueing, HEP, and activity modification. Patient articulated understanding and returned demonstration.  Patient Response to interventions: L sided vaginal pain after pudendal nerve mobilizations.  ASSESSMENT Patient presents to clinic with excellent motivation to participate in therapy. Patient demonstrates deficits in sensation, PFM coordination, PFM extensibility, and IAP management. Patient able to perform modified wall PFM stretches with reproduction of concordant L sided vaginal pain during today's session and responded positively to educational and active interventions. Patient will benefit from continued skilled therapeutic intervention to address remaining deficits in sensation, PFM coordination, PFM extensibility, and IAP management in order to increase function and improve overall QOL.     PT Long Term Goals - 04/04/20 1414      PT LONG TERM GOAL #1   Title Patient will demonstrate independence with HEP in order to maximize therapeutic gains and improve carryover from physical therapy sessions to ADLs in the home and community.    Baseline IE: not  demonstrated    Time 8    Period Weeks    Status New    Target Date 05/30/20      PT LONG TERM GOAL #2   Title Patient will demonstrate independent and coordinated diaphragmatic breathing in supine with a 1:2 breathing pattern for improved down-regulation of the nervous system and improved management of intra-abdominal pressures in order to increase function at home and in the community.    Baseline IE: not demonstrated    Time 8    Period Weeks    Status New     Target Date 05/30/20      PT LONG TERM GOAL #3   Title Patient will report being able to return to activities including, but not limited to: gynecological exams and partner intimacy without pain or limitation to indicate complete resolution of the chief complaint and return to prior level of participation at home and in the community.    Baseline IE: unable    Time 8    Period Weeks    Status New    Target Date 05/30/20      PT LONG TERM GOAL #4   Title Patient will demonstrate improved function as evidenced by a score of <10 on FOTO measure for full participation in activities at home and in the community.    Baseline IE: 25    Time 8    Period Weeks    Status New    Target Date 05/30/20                 Plan - 05/09/20 0912    Clinical Impression Statement Patient presents to clinic with excellent motivation to participate in therapy. Patient demonstrates deficits in sensation, PFM coordination, PFM extensibility, and IAP management. Patient able to perform modified wall PFM stretches with reproduction of concordant L sided vaginal pain during today's session and responded positively to educational and active interventions. Patient will benefit from continued skilled therapeutic intervention to address remaining deficits in sensation, PFM coordination, PFM extensibility, and IAP management in order to increase function and improve overall QOL.    Personal Factors and Comorbidities Age;Comorbidity 3+;Past/Current Experience;Time since onset of injury/illness/exacerbation    Comorbidities fibromyalgia,    Examination-Activity Limitations Sit;Other    Examination-Participation Restrictions Interpersonal Relationship    Stability/Clinical Decision Making Evolving/Moderate complexity    Rehab Potential Good    PT Frequency 1x / week    PT Duration 8 weeks    PT Treatment/Interventions Joint Manipulations;Spinal Manipulations;Taping;Dry needling;Manual techniques;Patient/family  education;Neuromuscular re-education;Therapeutic activities;Therapeutic exercise;Moist Heat;Cryotherapy;ADLs/Self Care Home Management;Biofeedback;Electrical Stimulation    PT Next Visit Plan nervous system downtraining    PT Home Exercise Plan toileting posture for decreased straining with defecation; body scan    Consulted and Agree with Plan of Care Patient           Patient will benefit from skilled therapeutic intervention in order to improve the following deficits and impairments:  Increased muscle spasms,Impaired sensation,Pain,Postural dysfunction,Improper body mechanics,Obesity,Impaired flexibility,Decreased coordination  Visit Diagnosis: Muscle weakness (generalized)  Other lack of coordination     Problem List Patient Active Problem List   Diagnosis Date Noted  . 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 Fibromyalgia 10/25/2017  . Influenza vaccine needed 01/20/2017   Myles Gip PT, DPT [phone]  05/09/2020, 12:48 PM  [facility] [address] [place], [place], [address] Phone: [phone]   Fax:  [phone]  Name: Margaret Cuevas MRN: [id number] Date of Birth: 25-Aug-1975

## 2020-05-16 ENCOUNTER — Ambulatory Visit: Payer: BC Managed Care – PPO | Admitting: Physical Therapy

## 2020-05-16 DIAGNOSIS — R110 Nausea: Secondary | ICD-10-CM | POA: Diagnosis not present

## 2020-05-16 DIAGNOSIS — R0981 Nasal congestion: Secondary | ICD-10-CM | POA: Diagnosis not present

## 2020-05-16 DIAGNOSIS — U071 COVID-19: Secondary | ICD-10-CM | POA: Diagnosis not present

## 2020-05-21 ENCOUNTER — Ambulatory Visit: Payer: Self-pay

## 2020-05-21 NOTE — Telephone Encounter (Signed)
If pt is getting short of breath she needs to go to walk-in clinic or ER. Otherwise, she has to let it run it's course. Take Tylenol, push fluids

## 2020-05-21 NOTE — Telephone Encounter (Signed)
Called and spoke to patient in reference to message, she said ok.

## 2020-05-21 NOTE — Telephone Encounter (Signed)
Last Thursday dx with covid. Pt stated she is  Drinking warm and cold water, taking mucinex DM,  Zinc,  rx for nausea, and c/o " guts hurtin"g-yesterday weak-last night with chills and SOB at rest. Pt febrile  101 last night and took  Tylenol and her  temp broke and she began sweating-Pt  called to see if there is anything to do to speed up process- SOB comes and goes -  - pain upper back that - comes and goes- when breathing gets heavier she stated she feels the pain more. Pt stated she is  coughing up phlegm but not able to expectorate it and unable to determine the color of the phlegm.  Advised pt to dc the DM and to begin saline nasal spray and OTC nasal spray. Pt would like to know if she needs to get xray.  Vax:1st shot:12/25/19    2 nd shot: 01/15/20  Reason for Disposition . [1] PERSISTING SYMPTOMS OF COVID-19 AND [2] NEW symptom AND [3] could be serious  Protocols used: CORONAVIRUS (COVID-19) PERSISTING SYMPTOMS FOLLOW-UP CALL-A-AH

## 2020-05-23 ENCOUNTER — Ambulatory Visit: Payer: BC Managed Care – PPO | Admitting: Physical Therapy

## 2020-05-30 ENCOUNTER — Ambulatory Visit: Payer: BC Managed Care – PPO | Admitting: Physical Therapy

## 2020-06-04 ENCOUNTER — Other Ambulatory Visit: Payer: Self-pay

## 2020-06-04 ENCOUNTER — Ambulatory Visit: Payer: BC Managed Care – PPO | Attending: Obstetrics and Gynecology | Admitting: Physical Therapy

## 2020-06-04 ENCOUNTER — Encounter: Payer: Self-pay | Admitting: Physical Therapy

## 2020-06-04 DIAGNOSIS — M6281 Muscle weakness (generalized): Secondary | ICD-10-CM | POA: Insufficient documentation

## 2020-06-04 DIAGNOSIS — R278 Other lack of coordination: Secondary | ICD-10-CM

## 2020-06-04 NOTE — Therapy (Signed)
[facility] [address] [place], [place], [address] Phone: [phone]   Fax:  [phone]  Physical Therapy Treatment  Patient Details  Name: Margaret Cuevas MRN: [id number] Date of Birth: [birth year] Referring Provider (PT): Schermerhorn   Encounter Date: 06/04/2020   PT End of Session - 06/04/20 1708    Visit Number 5    Number of Visits 8    Date for PT Re-Evaluation 07/30/20    PT Start Time 1700    PT Stop Time 1755    PT Time Calculation (min) 55 min    Activity Tolerance Patient tolerated treatment well;Patient limited by pain    Behavior During Therapy [facility] for tasks assessed/performed           Past Medical History:  Diagnosis Date  . Fibromyalgia     Past Surgical History:  Procedure Laterality Date  . ABDOMINAL HYSTERECTOMY    . ANKLE FRACTURE SURGERY     plates and screws placed on both sides  . BACK SURGERY    . CESAREAN SECTION    . CHOLECYSTECTOMY    . EAR TUBE REMOVAL    . GASTRIC BYPASS  2006  . ORIF FINGER / THUMB FRACTURE     screws in thumb    There were no vitals filed for this visit.   Subjective Assessment - 06/04/20 1703    Subjective Patient notes that she has had a rough few weeks with moving and a covid diagnosis. Patient has had increased joint pain throughout all of this. She woke up the other night with throbbing pain in UE joints. Patient notes that she feels she has had a setback with her vaginal pain as it is now on both sides. Patient notes that the pain radiates form the vaginal walls to the rectum; the pain begins as sharp pain and will sometimes shift to burning.    Currently in Pain? Yes    Pain Score 6     Pain Location Genitalia    Pain Descriptors / Indicators Sharp          TREATMENT Manual Therapy: STM and TPR performed to B gluteal mm and Alcock's Canal to allow for decreased tension and pain and improved posture and function Sacral border mobilizations for decreased  pain and improved mobility, grade II/III  Neuromuscular Re-education: Prone diaphragmatic breathing for pain modulation and improved PFM coordination with breath Reassessed goals; see below.  Patient education on strategies to decrease irritation of pudendal nerve and vaginal tissue including: genital hygiene, loose fitting clothing, natural breathable fibers, modifications to seated posture for decreased compression of nerve.  Patient educated throughout session on appropriate technique and form using multi-modal cueing, HEP, and activity modification. Patient articulated understanding and returned demonstration.  Patient Response to interventions: Notes decreased pain s/p manual interventions  ASSESSMENT Patient presents to clinic with excellent motivation to participate in therapy. Patient demonstrates deficits in sensation, PFM coordination, PFM extensibility, and IAP management. Patient able to achieve pain relief with manual interventions during today's session and responded positively to educational and active interventions. While patient has implemented her home exercise plan with good consistency, attendance at PT appointments has been limited 2/2 to work demands, moving home, and contracting Covid-19. Patient continues to demonstrate exceptional motivation and has had decreased numbness with intercourse although pain remains. Patient's condition has the potential to improve in response to therapy. Maximum improvement is yet to be obtained. The anticipated improvement is attainable and reasonable in a  generally predictable time. Patient will benefit from continued skilled therapeutic intervention to address remaining deficits in sensation, PFM coordination, PFM extensibility, and IAP management in order to increase function and improve overall QOL.     PT Long Term Goals - 06/04/20 1710      PT LONG TERM GOAL #1   Title Patient will demonstrate independence with HEP in order to maximize  therapeutic gains and improve carryover from physical therapy sessions to ADLs in the home and community.    Baseline IE: not demonstrated; 2/8: IND    Time 8    Period Weeks    Status Achieved      PT LONG TERM GOAL #2   Title Patient will demonstrate independent and coordinated diaphragmatic breathing in supine with a 1:2 breathing pattern for improved down-regulation of the nervous system and improved management of intra-abdominal pressures in order to increase function at home and in the community.    Baseline IE: not demonstrated; 2/8: IND    Time 8    Period Weeks    Status Achieved      PT LONG TERM GOAL #3   Title Patient will report being able to return to activities including, but not limited to: gynecological exams and partner intimacy without pain or limitation to indicate complete resolution of the chief complaint and return to prior level of participation at home and in the community.    Baseline IE: unable; 2/8: partner intimacy initial penetration L 5/10 (no report of numbness)    Time 8    Period Weeks    Status On-going      PT LONG TERM GOAL #4   Title Patient will demonstrate improved function as evidenced by a score of <10 on FOTO measure for full participation in activities at home and in the community.    Baseline IE: 25; 2/8: 25    Time 8    Period Weeks    Status On-going    Target Date 07/30/20                 Plan - 06/04/20 1709    Clinical Impression Statement Patient presents to clinic with excellent motivation to participate in therapy. Patient demonstrates deficits in sensation, PFM coordination, PFM extensibility, and IAP management. Patient able to achieve pain relief with manual interventions during today's session and responded positively to educational and active interventions. While patient has implemented her home exercise plan with good consistency, attendance at PT appointments has been limited 2/2 to work demands, moving home, and  contracting Covid-19. Patient continues to demonstrate exceptional motivation and has had decreased numbness with intercourse although pain remains. Patient's condition has the potential to improve in response to therapy. Maximum improvement is yet to be obtained. The anticipated improvement is attainable and reasonable in a generally predictable time. Patient will benefit from continued skilled therapeutic intervention to address remaining deficits in sensation, PFM coordination, PFM extensibility, and IAP management in order to increase function and improve overall QOL.    Personal Factors and Comorbidities Age;Comorbidity 3+;Past/Current Experience;Time since onset of injury/illness/exacerbation    Comorbidities fibromyalgia,    Examination-Activity Limitations Sit;Other    Examination-Participation Restrictions Interpersonal Relationship    Stability/Clinical Decision Making Evolving/Moderate complexity    Rehab Potential Good    PT Frequency 1x / week    PT Duration 8 weeks    PT Treatment/Interventions Joint Manipulations;Spinal Manipulations;Taping;Dry needling;Manual techniques;Patient/family education;Neuromuscular re-education;Therapeutic activities;Therapeutic exercise;Moist Heat;Cryotherapy;ADLs/Self Care Home Management;Biofeedback;Electrical Stimulation    PT Next Visit  Plan nervous system downtraining    PT Home Exercise Plan toileting posture for decreased straining with defecation; body scan; PFM stretches    Consulted and Agree with Plan of Care Patient           Patient will benefit from skilled therapeutic intervention in order to improve the following deficits and impairments:  Increased muscle spasms,Impaired sensation,Pain,Postural dysfunction,Improper body mechanics,Obesity,Impaired flexibility,Decreased coordination  Visit Diagnosis: Muscle weakness (generalized)  Other lack of coordination     Problem List Patient Active Problem List   Diagnosis Date Noted  .  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 Fibromyalgia 10/25/2017  . Influenza vaccine needed 01/20/2017   Myles Gip PT, DPT [phone]  06/04/2020, 6:11 PM  [facility] [address] [place], [place], [address] Phone: [phone]   Fax:  [phone]  Name: Margaret Cuevas MRN: [id number] Date of Birth: 06-12-1975

## 2020-06-06 ENCOUNTER — Encounter: Payer: Federal, State, Local not specified - PPO | Admitting: Physical Therapy

## 2020-06-13 ENCOUNTER — Ambulatory Visit: Payer: BC Managed Care – PPO | Admitting: Physical Therapy

## 2020-06-20 ENCOUNTER — Ambulatory Visit: Payer: BC Managed Care – PPO | Admitting: Physical Therapy

## 2020-06-27 ENCOUNTER — Other Ambulatory Visit: Payer: Self-pay

## 2020-06-27 ENCOUNTER — Ambulatory Visit: Payer: BC Managed Care – PPO | Attending: Obstetrics and Gynecology | Admitting: Physical Therapy

## 2020-06-27 DIAGNOSIS — Z20822 Contact with and (suspected) exposure to covid-19: Secondary | ICD-10-CM | POA: Insufficient documentation

## 2020-06-27 DIAGNOSIS — F17210 Nicotine dependence, cigarettes, uncomplicated: Secondary | ICD-10-CM | POA: Diagnosis not present

## 2020-06-27 DIAGNOSIS — R197 Diarrhea, unspecified: Secondary | ICD-10-CM | POA: Insufficient documentation

## 2020-06-27 DIAGNOSIS — R1031 Right lower quadrant pain: Secondary | ICD-10-CM | POA: Diagnosis not present

## 2020-06-27 DIAGNOSIS — R109 Unspecified abdominal pain: Secondary | ICD-10-CM | POA: Diagnosis not present

## 2020-06-27 DIAGNOSIS — R110 Nausea: Secondary | ICD-10-CM | POA: Diagnosis not present

## 2020-06-27 LAB — URINALYSIS, COMPLETE (UACMP) WITH MICROSCOPIC
Bilirubin Urine: NEGATIVE
Glucose, UA: NEGATIVE mg/dL
Hgb urine dipstick: NEGATIVE
Ketones, ur: 5 mg/dL — AB
Leukocytes,Ua: NEGATIVE
Nitrite: NEGATIVE
Protein, ur: 30 mg/dL — AB
Specific Gravity, Urine: 1.03 (ref 1.005–1.030)
Squamous Epithelial / HPF: >50 — ABNORMAL HIGH (ref 0–5)
pH: 5 (ref 5.0–8.0)

## 2020-06-27 LAB — CBC
HCT: 38.9 % (ref 36.0–46.0)
Hemoglobin: 12.2 g/dL (ref 12.0–15.0)
MCH: 28.6 pg (ref 26.0–34.0)
MCHC: 31.4 g/dL (ref 30.0–36.0)
MCV: 91.1 fL (ref 80.0–100.0)
Platelets: 272 10*3/uL (ref 150–400)
RBC: 4.27 MIL/uL (ref 3.87–5.11)
RDW: 16.1 % — ABNORMAL HIGH (ref 11.5–15.5)
WBC: 6.3 10*3/uL (ref 4.0–10.5)
nRBC: 0 % (ref 0.0–0.2)

## 2020-06-27 LAB — COMPREHENSIVE METABOLIC PANEL
ALT: 15 U/L (ref 0–44)
AST: 19 U/L (ref 15–41)
Albumin: 3.8 g/dL (ref 3.5–5.0)
Alkaline Phosphatase: 55 U/L (ref 38–126)
Anion gap: 9 (ref 5–15)
BUN: 18 mg/dL (ref 6–20)
CO2: 18 mmol/L — ABNORMAL LOW (ref 22–32)
Calcium: 8.8 mg/dL — ABNORMAL LOW (ref 8.9–10.3)
Chloride: 109 mmol/L (ref 98–111)
Creatinine, Ser: 0.64 mg/dL (ref 0.44–1.00)
GFR, Estimated: >60 mL/min (ref >60)
Glucose, Bld: 105 mg/dL — ABNORMAL HIGH (ref 70–99)
Potassium: 4 mmol/L (ref 3.5–5.1)
Sodium: 136 mmol/L (ref 135–145)
Total Bilirubin: 0.5 mg/dL (ref 0.3–1.2)
Total Protein: 7 g/dL (ref 6.5–8.1)

## 2020-06-27 LAB — LIPASE, BLOOD: Lipase: 24 U/L (ref 11–51)

## 2020-06-27 NOTE — ED Triage Notes (Signed)
Pt states for the past 2 days she has had multiple episode of diarrhea and has had RLQ abdominal pain as well as nausea. Pt denies emesis or fevers.

## 2020-06-28 ENCOUNTER — Emergency Department
Admission: EM | Admit: 2020-06-28 | Discharge: 2020-06-28 | Disposition: A | Discharge status: Home / Self Care | Payer: BC Managed Care – PPO | Attending: Emergency Medicine | Admitting: Emergency Medicine

## 2020-06-28 ENCOUNTER — Emergency Department: Payer: BC Managed Care – PPO

## 2020-06-28 DIAGNOSIS — R197 Diarrhea, unspecified: Secondary | ICD-10-CM

## 2020-06-28 DIAGNOSIS — R1031 Right lower quadrant pain: Secondary | ICD-10-CM

## 2020-06-28 DIAGNOSIS — R109 Unspecified abdominal pain: Secondary | ICD-10-CM | POA: Diagnosis not present

## 2020-06-28 HISTORY — DX: Anxiety disorder, unspecified: F41.9

## 2020-06-28 LAB — RESP PANEL BY RT-PCR (FLU A&B, COVID) ARPGX2
Influenza A by PCR: NEGATIVE
Influenza B by PCR: NEGATIVE
SARS Coronavirus 2 by RT PCR: NEGATIVE

## 2020-06-28 MED ORDER — ONDANSETRON HCL 4 MG/2ML IJ SOLN
4.0000 mg | Freq: Once | INTRAMUSCULAR | Status: AC
  Administered 2020-06-28: 4 mg via INTRAVENOUS
  Filled 2020-06-28: qty 2

## 2020-06-28 MED ORDER — CIPROFLOXACIN HCL 500 MG PO TABS
500.0000 mg | ORAL_TABLET | Freq: Once | ORAL | Status: AC
  Administered 2020-06-28: 500 mg via ORAL
  Filled 2020-06-28: qty 1

## 2020-06-28 MED ORDER — MORPHINE SULFATE (PF) 4 MG/ML IV SOLN
4.0000 mg | Freq: Once | INTRAVENOUS | Status: AC
  Administered 2020-06-28: 4 mg via INTRAVENOUS
  Filled 2020-06-28: qty 1

## 2020-06-28 MED ORDER — CIPROFLOXACIN HCL 500 MG PO TABS: 500.0000 mg | ORAL_TABLET | Freq: Two times a day (BID) | ORAL | 0 refills | Status: DC

## 2020-06-28 MED ORDER — IOHEXOL 300 MG/ML  SOLN
125.0000 mL | Freq: Once | INTRAMUSCULAR | Status: AC | PRN
  Administered 2020-06-28: 125 mL via INTRAVENOUS

## 2020-06-28 MED ORDER — ONDANSETRON 4 MG PO TBDP
4.0000 mg | ORAL_TABLET | Freq: Three times a day (TID) | ORAL | 0 refills | Status: DC | PRN
Start: 2020-06-28 — End: 2020-11-26

## 2020-06-28 MED ORDER — SODIUM CHLORIDE 0.9 % IV BOLUS
1000.0000 mL | Freq: Once | INTRAVENOUS | Status: AC
  Administered 2020-06-28: 1000 mL via INTRAVENOUS

## 2020-06-28 NOTE — Discharge Instructions (Addendum)
1.  Take antibiotic as prescribed (Cipro 500 mg twice daily x3 days).  Start your next dose in 12 hours or so. 2.  You may take Zofran as needed for nausea. 3.  Return to the ER for worsening symptoms, persistent vomiting, difficulty breathing or other concerns.

## 2020-06-28 NOTE — ED Notes (Signed)
U-preg discontinued d/t h/o hysterectomy

## 2020-06-28 NOTE — ED Notes (Signed)
Entered room to find pt sitting up in bed drinking 1st of 2 oral contrast bottles -- facial grimacing noted however pt responding appropriately to this nurses' questions.  Pt reports R side and mid abd pain sudden onset 2 days pta -- describes pain as sharp with spasms and reports diarrhea and nausea associated with pain.  Abdomen soft, nontender.  Denies known sick contact - denies abnormal vaginal discharge/odor and dysuria.  Will monitor for acute changes and maintain plan of care as pt awaits CT

## 2020-06-28 NOTE — ED Notes (Signed)
Pt agreeable with d/c plan as discussed by provider- this nurse has verbally reinforced d/c instructions and provided pt with written copy - pt acknowledges verbal understanding and denies any additional questions concerns needs.  Pt to wait in flex hold area for 2 additional hours as pt has received IVP Morphine only 2 hours ago and she will be driving home - educated pt on risks of driving home sooner; pt acknowledges verbal understanding

## 2020-06-28 NOTE — ED Provider Notes (Signed)
[facility] Emergency Department Provider Note   ____________________________________________   Event Date/Time   First MD Initiated Contact with Patient 06/28/20 0100     (approximate)  I have reviewed the triage vital signs and the nursing notes.   HISTORY  Chief Complaint Diarrhea and Abdominal Pain    HPI Margaret Cuevas is a 45 y.o. female who presents to the ED from home with a chief complaint of abdominal pain and diarrhea.  Patient reports a 3-day history of diarrhea and right lower quadrant abdominal pain with nausea.  Denies fever, chills, cough, chest pain, shortness of breath, dysuria, vomiting.  Denies recent travel or antibiotic use.  Denies sick contacts.     Past Medical History:  Diagnosis Date  . Anxiety   . Fibromyalgia     Patient Active Problem List   Diagnosis Date Noted  . 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 Fibromyalgia 10/25/2017  . Influenza vaccine needed 01/20/2017    Past Surgical History:  Procedure Laterality Date  . ABDOMINAL HYSTERECTOMY    . ANKLE FRACTURE SURGERY     plates and screws placed on both sides  . APPENDECTOMY    . BACK SURGERY    . CESAREAN SECTION    . CHOLECYSTECTOMY    . EAR TUBE REMOVAL    . GASTRIC BYPASS  2006  . ORIF FINGER / THUMB FRACTURE     screws in thumb    Prior to Admission medications   Medication Sig Start Date End Date Taking? Authorizing Provider  ciprofloxacin (CIPRO) 500 MG tablet Take 1 tablet (500 mg total) by mouth 2 (two) times daily. 06/28/20  Yes Paulette Blanch, MD  ondansetron (ZOFRAN ODT) 4 MG disintegrating tablet Take 1 tablet (4 mg total) by mouth every 8 (eight) hours as needed for nausea or vomiting. 06/28/20  Yes Paulette Blanch, MD  busPIRone (BUSPAR) 5 MG tablet TAKE 1 TABLET(5 MG) BY MOUTH TWICE DAILY 12/27/19   Juline Patch, MD  cetirizine (ZYRTEC) 10 MG tablet Take 1 tablet (10 mg total) by mouth daily. 11/07/18   Juline Patch, MD  cyclobenzaprine (FLEXERIL) 10 MG  tablet Take 1 tablet (10 mg total) by mouth 3 (three) times daily as needed. 04/21/20   Fisher, Linden Dolin, PA-C  DULoxetine (CYMBALTA) 30 MG capsule Take 1 capsule (30 mg total) by mouth daily. 12/27/19   Juline Patch, MD  estradiol (VIVELLE-DOT) 0.05 MG/24HR patch UNWRAP AND APPLY 1 PATCH TO SKIN TWICE PER WEEK 12/27/19   Juline Patch, MD  methylPREDNISolone (MEDROL DOSEPAK) 4 MG TBPK tablet Take 6 pills on day one then decrease by 1 pill each day 04/21/20   Versie Starks, PA-C  traMADol (ULTRAM) 50 MG tablet Take 1 tablet (50 mg total) by mouth every 6 (six) hours as needed. 04/21/20   Versie Starks, PA-C    Allergies Ibuprofen  Family History  Problem Relation Age of Onset  . Diabetes Mother   . Cancer Maternal Grandmother   . Diabetes Maternal Grandmother   . Hypertension Maternal Grandmother   . Cancer Paternal Grandfather   . Hypertension Father   . Hypertension Paternal Grandmother     Social History Social History   Tobacco Use  . Smoking status: Current Some Day Smoker    Types: Cigarettes  . Smokeless tobacco: Never Used  Substance Use Topics  . Alcohol use: Yes  . Drug use: No    Review of Systems  Constitutional: No  fever/chills Eyes: No visual changes. ENT: No sore throat. Cardiovascular: Denies chest pain. Respiratory: Denies shortness of breath. Gastrointestinal: Positive for abdominal pain and nausea, no vomiting.  Positive for diarrhea.  No constipation. Genitourinary: Negative for dysuria. Musculoskeletal: Negative for back pain. Skin: Negative for rash. Neurological: Negative for headaches, focal weakness or numbness.   ____________________________________________   PHYSICAL EXAM:  VITAL SIGNS: ED Triage Vitals  Enc Vitals Group     BP 06/27/20 2155 (!) 123/98     Pulse Rate 06/27/20 2155 99     Resp 06/27/20 2155 18     Temp 06/27/20 2155 99 F (37.2 C)     Temp Source 06/27/20 2155 Oral     SpO2 06/27/20 2155 96 %     Weight  06/27/20 2154 230 lb (104.3 kg)     Height 06/27/20 2154 5\' 6"  (1.676 m)     Head Circumference --      Peak Flow --      Pain Score 06/27/20 2154 7     Pain Loc --      Pain Edu? --      Excl. in [place]? --     Constitutional: Alert and oriented. Well appearing and in mild acute distress. Eyes: Conjunctivae are normal. PERRL. EOMI. Head: Atraumatic. Nose: No congestion/rhinnorhea. Mouth/Throat: Mucous membranes are mildly dry. Neck: No stridor.   Cardiovascular: Normal rate, regular rhythm. Grossly normal heart sounds.  Good peripheral circulation. Respiratory: Normal respiratory effort.  No retractions. Lungs CTAB. Gastrointestinal: Soft and mildly tender to palpation right lower quadrant without rebound or guarding. No distention. No abdominal bruits. No CVA tenderness. Musculoskeletal: No lower extremity tenderness nor edema.  No joint effusions. Neurologic:  Normal speech and language. No gross focal neurologic deficits are appreciated. No gait instability. Skin:  Skin is warm, dry and intact. No rash noted. Psychiatric: Mood and affect are normal. Speech and behavior are normal.  ____________________________________________   LABS (all labs ordered are listed, but only abnormal results are displayed)  Labs Reviewed  COMPREHENSIVE METABOLIC PANEL - Abnormal; Notable for the following components:      Result Value   CO2 18 (*)    Glucose, Bld 105 (*)    Calcium 8.8 (*)    All other components within normal limits  CBC - Abnormal; Notable for the following components:   RDW 16.1 (*)    All other components within normal limits  URINALYSIS, COMPLETE (UACMP) WITH MICROSCOPIC - Abnormal; Notable for the following components:   Color, Urine YELLOW (*)    APPearance CLOUDY (*)    Ketones, ur 5 (*)    Protein, ur 30 (*)    Bacteria, UA MANY (*)    Squamous Epithelial / LPF >50 (*)    All other components within normal limits  RESP PANEL BY RT-PCR (FLU A&B, COVID) ARPGX2   LIPASE, BLOOD   ____________________________________________  EKG  None ____________________________________________  RADIOLOGY I, Saretta Dahlem J, personally viewed and evaluated these images (plain radiographs) as part of my medical decision making, as well as reviewing the written report by the radiologist.  ED MD interpretation: Unremarkable CT abdomen/pelvis  Official radiology report(s): CT Abdomen Pelvis W Contrast  Result Date: 06/28/2020 CLINICAL DATA:  Diarrhea, right lower quadrant abdominal pain EXAM: CT ABDOMEN AND PELVIS WITH CONTRAST TECHNIQUE: Multidetector CT imaging of the abdomen and pelvis was performed using the standard protocol following bolus administration of intravenous contrast. CONTRAST:  181mL OMNIPAQUE IOHEXOL 300 MG/ML  SOLN COMPARISON:  None.  FINDINGS: Lower chest: The visualized lung bases are clear bilaterally. Small hiatal hernia. The visualized heart and pericardium are unremarkable. Hepatobiliary: No focal liver abnormality is seen. Status post cholecystectomy. No biliary dilatation. Pancreas: Unremarkable Spleen: Unremarkable Adrenals/Urinary Tract: Adrenal glands are unremarkable. Kidneys are normal, without renal calculi, focal lesion, or hydronephrosis. Bladder is unremarkable. Stomach/Bowel: Surgical changes of Roux-en-Y gastric bypass are identified. The cecum is hypermobile and located within the left upper quadrant of the abdomen, however, there is no evidence of obstruction or focal inflammation identified. The appendix is absent. The stomach, small bowel, and large bowel are otherwise unremarkable. No free intraperitoneal gas or fluid. Vascular/Lymphatic: No significant vascular findings are present. No enlarged abdominal or pelvic lymph nodes. Reproductive: Status post hysterectomy. No adnexal masses. Other: No abdominal wall hernia. Rectum is fluid-filled, a nonspecific finding that may present clinically as diarrhea. Musculoskeletal: No acute bone  abnormality. IMPRESSION: No acute intra-abdominal pathology identified. No definite radiographic explanation for the patient's reported symptoms. Incidental findings as noted above. Electronically Signed   By: Fidela Salisbury MD   On: 06/28/2020 03:20    ____________________________________________   PROCEDURES  Procedure(s) performed (including Critical Care):  Procedures   ____________________________________________   INITIAL IMPRESSION / ASSESSMENT AND PLAN / ED COURSE  As part of my medical decision making, I reviewed the following data within the [place] notes reviewed and incorporated, Labs reviewed, Old chart reviewed, Radiograph reviewed and Notes from prior ED visits     45 year old female presenting with abdominal pain, nausea and diarrhea. Differential diagnosis includes, but is not limited to, ovarian cyst, ovarian torsion, acute appendicitis, diverticulitis, urinary tract infection/pyelonephritis, endometriosis, bowel obstruction, colitis, renal colic, gastroenteritis, hernia, etc.  Laboratory results remarkable for mild dehydration.  Will initiate IV fluid resuscitation, IV Morphine for pain paired with IV Zofran for nausea.  Will obtain CT abdomen/pelvis to evaluate appendix.  Clinical Course as of 06/28/20 0444  Fri Jun 28, 2020  0327 Patient feeling significantly better.  Updated her on CT imaging result.  Will treat with 3-day course of Cipro.  Discharged home with as needed Zofran and patient will follow up closely with her PCP.  Strict return precautions given.  Patient verbalizes understanding agrees with plan of care. [JS]    Clinical Course User Index [JS] Paulette Blanch, MD     ____________________________________________   FINAL CLINICAL IMPRESSION(S) / ED DIAGNOSES  Final diagnoses:  Diarrhea, unspecified type  Right lower quadrant abdominal pain     ED Discharge Orders         Ordered    ciprofloxacin (CIPRO) 500 MG  tablet  2 times daily        06/28/20 0330    ondansetron (ZOFRAN ODT) 4 MG disintegrating tablet  Every 8 hours PRN        06/28/20 0330          *Please note:  Margaret Cuevas was evaluated in Emergency Department on 06/28/2020 for the symptoms described in the history of present illness. She was evaluated in the context of the global COVID-19 pandemic, which necessitated consideration that the patient might be at risk for infection with the SARS-CoV-2 virus that causes COVID-19. Institutional protocols and algorithms that pertain to the evaluation of patients at risk for COVID-19 are in a state of rapid change based on information released by regulatory bodies including the CDC and federal and state organizations. These policies and algorithms were followed during the patient's care in the ED.  Some ED evaluations and interventions may be delayed as a result of limited staffing during and the pandemic.*   Note:  This document was prepared using Dragon voice recognition software and may include unintentional dictation errors.   Paulette Blanch, MD 06/28/20 [phone]

## 2020-06-29 ENCOUNTER — Other Ambulatory Visit: Payer: Self-pay | Admitting: Family Medicine

## 2020-06-29 DIAGNOSIS — F419 Anxiety disorder, unspecified: Secondary | ICD-10-CM

## 2020-07-03 ENCOUNTER — Ambulatory Visit: Payer: Self-pay | Admitting: Family Medicine

## 2020-07-04 ENCOUNTER — Encounter: Payer: Self-pay | Admitting: Physical Therapy

## 2020-07-11 ENCOUNTER — Encounter: Payer: Self-pay | Admitting: Physical Therapy

## 2020-07-15 ENCOUNTER — Encounter: Payer: Self-pay | Admitting: Family Medicine

## 2020-07-15 ENCOUNTER — Other Ambulatory Visit: Payer: Self-pay

## 2020-07-15 ENCOUNTER — Ambulatory Visit (INDEPENDENT_AMBULATORY_CARE_PROVIDER_SITE_OTHER): Payer: BC Managed Care – PPO | Admitting: Family Medicine

## 2020-07-15 VITALS — BP 130/98 | HR 64 | Ht 66.0 in | Wt 235.0 lb

## 2020-07-15 DIAGNOSIS — E785 Hyperlipidemia, unspecified: Secondary | ICD-10-CM

## 2020-07-15 DIAGNOSIS — J302 Other seasonal allergic rhinitis: Secondary | ICD-10-CM | POA: Diagnosis not present

## 2020-07-15 DIAGNOSIS — F32A Depression, unspecified: Secondary | ICD-10-CM

## 2020-07-15 DIAGNOSIS — F419 Anxiety disorder, unspecified: Secondary | ICD-10-CM | POA: Diagnosis not present

## 2020-07-15 DIAGNOSIS — M797 Fibromyalgia: Secondary | ICD-10-CM | POA: Diagnosis not present

## 2020-07-15 DIAGNOSIS — Z90710 Acquired absence of both cervix and uterus: Secondary | ICD-10-CM

## 2020-07-15 DIAGNOSIS — F17200 Nicotine dependence, unspecified, uncomplicated: Secondary | ICD-10-CM

## 2020-07-15 DIAGNOSIS — Z9049 Acquired absence of other specified parts of digestive tract: Secondary | ICD-10-CM

## 2020-07-15 MED ORDER — BUSPIRONE HCL 5 MG PO TABS
ORAL_TABLET | ORAL | 1 refills | Status: DC
Start: 2020-07-15 — End: 2020-12-26

## 2020-07-15 MED ORDER — CETIRIZINE HCL 10 MG PO TABS: 10.0000 mg | ORAL_TABLET | Freq: Every day | ORAL | 1 refills | Status: DC

## 2020-07-15 MED ORDER — DULOXETINE HCL 30 MG PO CPEP: 30.0000 mg | ORAL_CAPSULE | Freq: Every day | ORAL | 1 refills | Status: DC

## 2020-07-15 NOTE — Progress Notes (Signed)
Date:  07/15/2020   Name:  Margaret Cuevas   DOB:  [birth year]   MRN:  [id number]   Chief Complaint: Allergic Rhinitis , Anxiety (9 and 8), and Fibromyalgia (Takes duloxetine)  Anxiety Presents for follow-up visit. Symptoms include excessive worry, irritability and nervous/anxious behavior. Patient reports no chest pain, feeling of choking, malaise, muscle tension or suicidal ideas. Symptoms occur occasionally.    Depression        This is a chronic problem.  The current episode started more than 1 year ago.   Associated symptoms include helplessness, hopelessness, irritable, decreased interest, body aches and myalgias.  Associated symptoms include no suicidal ideas.  Past treatments include SNRIs - Serotonin and norepinephrine reuptake inhibitors.  Past medical history includes anxiety.   URI  This is a chronic (for allergic rhinitis) problem. The current episode started more than 1 year ago. The problem has been waxing and waning. There has been no fever. Associated symptoms include congestion and sneezing. Pertinent negatives include no chest pain or rhinorrhea. She has tried antihistamine for the symptoms.    Lab Results  Component Value Date   CREATININE 0.64 06/27/2020   BUN 18 06/27/2020   NA 136 06/27/2020   K 4.0 06/27/2020   CL 109 06/27/2020   CO2 18 (L) 06/27/2020   No results found for: CHOL, HDL, LDLCALC, LDLDIRECT, TRIG, CHOLHDL Lab Results  Component Value Date   TSH 1.480 07/03/2016   No results found for: HGBA1C Lab Results  Component Value Date   WBC 6.3 06/27/2020   HGB 12.2 06/27/2020   HCT 38.9 06/27/2020   MCV 91.1 06/27/2020   PLT 272 06/27/2020   Lab Results  Component Value Date   ALT 15 06/27/2020   AST 19 06/27/2020   ALKPHOS 55 06/27/2020   BILITOT 0.5 06/27/2020     Review of Systems  Constitutional: Positive for irritability.  HENT: Positive for congestion and sneezing. Negative for rhinorrhea.   Cardiovascular: Negative for chest  pain.  Musculoskeletal: Positive for myalgias.  Psychiatric/Behavioral: Positive for depression. Negative for suicidal ideas. The patient is nervous/anxious.     Patient Active Problem List   Diagnosis Date Noted  . H/O total hysterectomy 07/15/2020  . History of appendectomy 07/15/2020  . 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 Fibromyalgia 10/25/2017  . Influenza vaccine needed 01/20/2017    Allergies  Allergen Reactions  . Ibuprofen Other (See Comments)    Other Reaction: Other reaction-swells    Past Surgical History:  Procedure Laterality Date  . ABDOMINAL HYSTERECTOMY    . ANKLE FRACTURE SURGERY     plates and screws placed on both sides  . APPENDECTOMY    . BACK SURGERY    . CESAREAN SECTION    . CHOLECYSTECTOMY    . EAR TUBE REMOVAL    . GASTRIC BYPASS  2006  . ORIF FINGER / THUMB FRACTURE     screws in thumb    Social History   Tobacco Use  . Smoking status: Current Some Day Smoker    Types: Cigarettes  . Smokeless tobacco: Never Used  Substance Use Topics  . Alcohol use: Yes  . Drug use: No     Medication list has been reviewed and updated.  Current Meds  Medication Sig  . busPIRone (BUSPAR) 5 MG tablet TAKE 1 TABLET(5 MG) BY MOUTH TWICE DAILY  . cetirizine (ZYRTEC) 10 MG tablet Take 1 tablet (10 mg total) by mouth daily.  . cyclobenzaprine (FLEXERIL) 10 MG  tablet Take 1 tablet (10 mg total) by mouth 3 (three) times daily as needed.  . DULoxetine (CYMBALTA) 30 MG capsule Take 1 capsule (30 mg total) by mouth daily.  Marland Kitchen estradiol (VIVELLE-DOT) 0.05 MG/24HR patch UNWRAP AND APPLY 1 PATCH TO SKIN TWICE PER WEEK (Patient taking differently: UNWRAP AND APPLY 1 PATCH TO SKIN TWICE PER WEEK/ schermerhorn)    PHQ 2/9 Scores 07/15/2020 12/27/2019 11/07/2018 03/17/2018  PHQ - 2 Score 3 1 0 3  PHQ- 9 Score 9 7 2 7     GAD 7 : Generalized Anxiety Score 07/15/2020 12/27/2019 11/07/2018  Nervous, Anxious, on Edge 0 1 0  Control/stop worrying 1 0 0  Worry too much - different  things 1 0 0  Trouble relaxing 3 0 1  Restless 0 0 1  Easily annoyed or irritable 3 1 1   Afraid - awful might happen 0 0 0  Total GAD 7 Score 8 2 3   Anxiety Difficulty Not difficult at all Somewhat difficult Not difficult at all    BP Readings from Last 3 Encounters:  07/15/20 (!) 130/98  06/28/20 126/89  04/21/20 (!) 133/104    Physical Exam Constitutional:      General: She is irritable.     Wt Readings from Last 3 Encounters:  07/15/20 235 lb (106.6 kg)  06/27/20 230 lb (104.3 kg)  04/21/20 238 lb (108 kg)    BP (!) 130/98   Pulse 64   Ht 5\' 6"  (1.676 m)   Wt 235 lb (106.6 kg)   BMI 37.93 kg/m   Assessment and Plan: 1. Anxiety Chronic.  Controlled.  Stable.  Gad score is 8.  Patient will continue buspirone 5 mg 1 twice a day. - busPIRone (BUSPAR) 5 MG tablet; Take 1 tablet twice a day  Dispense: 180 tablet; Refill: 1  2. Depression, unspecified depression type Chronic.  Controlled.  Stable.  Currently she has a PHQ of 9.  Patient is taking duloxetine 30 mg once a day.  We will increase this to 60 mg on a as needed basis primarily determined by her level of pain from her fibromyalgia. - DULoxetine (CYMBALTA) 30 MG capsule; Take 1 capsule (30 mg total) by mouth daily. Change to 1 - 2 as needed for pain.  Dispense: 135 capsule; Refill: 1  3. Fibromyalgia Patient was diagnosed with fibromyalgia and [facility].  Patient is episodic but has breakthrough increased pain which requires a second dosing of duloxetine.  And increased her amount of medication so she can take it on a 2 tablets element as needed basis.  4. Seasonal allergic rhinitis, unspecified trigger Chronic.  Controlled.  Stable.  Continue Zyrtec 10 mg once a day. - cetirizine (ZYRTEC) 10 MG tablet; Take 1 tablet (10 mg total) by mouth daily.  Dispense: 90 tablet; Refill: 1  5. Dyslipidemia She has a history of this lipidemia and we will check lipid panel for recheck. - Lipid Panel With LDL/HDL  Ratio  6. Tobacco dependence Patient has been advised of the health risks of smoking and counseled concerning cessation of tobacco products. I spent over 3 minutes for discussion and to answer questions.  7. H/O total hysterectomy Patient has history of total hysterectomy and this is duly noted  8. History of appendectomy Patient has a history of appendectomy and this is duly noted.

## 2020-07-16 LAB — LIPID PANEL WITH LDL/HDL RATIO
Cholesterol, Total: 223 mg/dL — ABNORMAL HIGH (ref 100–199)
HDL: 86 mg/dL (ref >39)
LDL Chol Calc (NIH): 119 mg/dL — ABNORMAL HIGH (ref 0–99)
LDL/HDL Ratio: 1.4 ratio (ref 0.0–3.2)
Triglycerides: 105 mg/dL (ref 0–149)
VLDL Cholesterol Cal: 18 mg/dL (ref 5–40)

## 2020-07-17 ENCOUNTER — Encounter: Payer: Self-pay | Admitting: Physical Therapy

## 2020-07-24 ENCOUNTER — Encounter: Payer: Self-pay | Admitting: Physical Therapy

## 2020-07-31 ENCOUNTER — Encounter: Payer: Self-pay | Admitting: Physical Therapy

## 2020-11-25 ENCOUNTER — Telehealth: Payer: BC Managed Care – PPO | Admitting: Family Medicine

## 2020-11-26 ENCOUNTER — Ambulatory Visit (INDEPENDENT_AMBULATORY_CARE_PROVIDER_SITE_OTHER): Payer: BC Managed Care – PPO | Admitting: Family Medicine

## 2020-11-26 ENCOUNTER — Other Ambulatory Visit: Payer: Self-pay

## 2020-11-26 ENCOUNTER — Encounter: Payer: Self-pay | Admitting: Family Medicine

## 2020-11-26 VITALS — BP 118/78 | HR 73 | Ht 66.0 in | Wt 244.0 lb

## 2020-11-26 DIAGNOSIS — G4730 Sleep apnea, unspecified: Secondary | ICD-10-CM

## 2020-11-26 DIAGNOSIS — Z8739 Personal history of other diseases of the musculoskeletal system and connective tissue: Secondary | ICD-10-CM

## 2020-11-26 DIAGNOSIS — F17200 Nicotine dependence, unspecified, uncomplicated: Secondary | ICD-10-CM | POA: Diagnosis not present

## 2020-11-26 DIAGNOSIS — R635 Abnormal weight gain: Secondary | ICD-10-CM | POA: Diagnosis not present

## 2020-11-26 NOTE — Progress Notes (Signed)
Date:  11/26/2020   Name:  Margaret Cuevas   DOB:  1975-09-06   MRN:  [id number]   Chief Complaint: Sleep Apnea (Waking up multiple times a night "gasping for air", daytime sleepiness and snoring) and Fibromyalgia (Needs referral)  Patient is a 45 year old female who presents for a comprehensive physical exam. The patient reports the following problems: apnea episodes/snoring/ daytime somnolence/weekend daytime naps. Health maintenance has been reviewed up to date.     Lab Results  Component Value Date   CREATININE 0.64 06/27/2020   BUN 18 06/27/2020   NA 136 06/27/2020   K 4.0 06/27/2020   CL 109 06/27/2020   CO2 18 (L) 06/27/2020   Lab Results  Component Value Date   CHOL 223 (H) 07/15/2020   HDL 86 07/15/2020   LDLCALC 119 (H) 07/15/2020   TRIG 105 07/15/2020   Lab Results  Component Value Date   TSH 1.480 07/03/2016   No results found for: HGBA1C Lab Results  Component Value Date   WBC 6.3 06/27/2020   HGB 12.2 06/27/2020   HCT 38.9 06/27/2020   MCV 91.1 06/27/2020   PLT 272 06/27/2020   Lab Results  Component Value Date   ALT 15 06/27/2020   AST 19 06/27/2020   ALKPHOS 55 06/27/2020   BILITOT 0.5 06/27/2020     Review of Systems  Constitutional:  Negative for chills and fever.  HENT:  Negative for drooling, ear discharge, ear pain and sore throat.   Respiratory:  Negative for cough, shortness of breath and wheezing.   Cardiovascular:  Negative for chest pain, palpitations and leg swelling.  Gastrointestinal:  Negative for abdominal pain, blood in stool, constipation, diarrhea and nausea.  Endocrine: Negative for polydipsia.  Genitourinary:  Negative for dysuria, frequency, hematuria and urgency.  Musculoskeletal:  Negative for back pain, myalgias and neck pain.  Skin:  Negative for rash.  Allergic/Immunologic: Negative for environmental allergies.  Neurological:  Negative for dizziness and headaches.  Hematological:  Does not bruise/bleed easily.   Psychiatric/Behavioral:  Negative for suicidal ideas. The patient is not nervous/anxious.    Patient Active Problem List   Diagnosis Date Noted   H/O total hysterectomy 07/15/2020   History of appendectomy 07/15/2020   Lipoma of back 11/15/2018   Fibromyalgia 10/25/2017   Influenza vaccine needed 01/20/2017    Allergies  Allergen Reactions   Ibuprofen Other (See Comments)    Other Reaction: Other reaction-swells    Past Surgical History:  Procedure Laterality Date   ABDOMINAL HYSTERECTOMY     ANKLE FRACTURE SURGERY     plates and screws placed on both sides   APPENDECTOMY     BACK SURGERY     CESAREAN SECTION     CHOLECYSTECTOMY     EAR TUBE REMOVAL     GASTRIC BYPASS  2006   ORIF FINGER / THUMB FRACTURE     screws in thumb    Social History   Tobacco Use   Smoking status: Some Days    Types: Cigarettes   Smokeless tobacco: Never  Substance Use Topics   Alcohol use: Yes   Drug use: No     Medication list has been reviewed and updated.  Current Meds  Medication Sig   busPIRone (BUSPAR) 5 MG tablet Take 1 tablet twice a day   cetirizine (ZYRTEC) 10 MG tablet Take 1 tablet (10 mg total) by mouth daily.   cyclobenzaprine (FLEXERIL) 10 MG tablet Take 1 tablet (10 mg total)  by mouth 3 (three) times daily as needed.   DULoxetine (CYMBALTA) 30 MG capsule Take 1 capsule (30 mg total) by mouth daily. Change to 1 - 2 as needed for pain.   estradiol (VIVELLE-DOT) 0.05 MG/24HR patch UNWRAP AND APPLY 1 PATCH TO SKIN TWICE PER WEEK (Patient taking differently: UNWRAP AND APPLY 1 PATCH TO SKIN TWICE PER WEEK/ schermerhorn)    PHQ 2/9 Scores 07/15/2020 12/27/2019 11/07/2018 03/17/2018  PHQ - 2 Score 3 1 0 3  PHQ- 9 Score '9 7 2 7    '$ GAD 7 : Generalized Anxiety Score 07/15/2020 12/27/2019 11/07/2018  Nervous, Anxious, on Edge 0 1 0  Control/stop worrying 1 0 0  Worry too much - different things 1 0 0  Trouble relaxing 3 0 1  Restless 0 0 1  Easily annoyed or irritable '3 1 1   '$ Afraid - awful might happen 0 0 0  Total GAD 7 Score '8 2 3  '$ Anxiety Difficulty Not difficult at all Somewhat difficult Not difficult at all    BP Readings from Last 3 Encounters:  11/26/20 118/78  07/15/20 (!) 130/98  06/28/20 126/89    Physical Exam Vitals and nursing note reviewed.  Constitutional:      Appearance: She is well-developed.  HENT:     Head: Normocephalic.     Right Ear: Tympanic membrane, ear canal and external ear normal. There is no impacted cerumen.     Left Ear: Tympanic membrane, ear canal and external ear normal. There is no impacted cerumen.     Nose: Nose normal. No congestion or rhinorrhea.  Eyes:     General: Lids are everted, no foreign bodies appreciated. No scleral icterus.       Left eye: No foreign body or hordeolum.     Conjunctiva/sclera: Conjunctivae normal.     Right eye: Right conjunctiva is not injected.     Left eye: Left conjunctiva is not injected.     Pupils: Pupils are equal, round, and reactive to light.  Neck:     Thyroid: No thyromegaly.     Vascular: No JVD.     Trachea: No tracheal deviation.  Cardiovascular:     Rate and Rhythm: Normal rate and regular rhythm.     Heart sounds: Normal heart sounds, S1 normal and S2 normal. No murmur heard. No systolic murmur is present.  No diastolic murmur is present.    No friction rub. No gallop. No S3 or S4 sounds.  Pulmonary:     Effort: Pulmonary effort is normal. No respiratory distress.     Breath sounds: Normal breath sounds. No stridor, decreased air movement or transmitted upper airway sounds. No decreased breath sounds, wheezing, rhonchi or rales.  Abdominal:     General: Bowel sounds are normal.     Palpations: Abdomen is soft. There is no mass.     Tenderness: There is no abdominal tenderness. There is no guarding or rebound.  Musculoskeletal:        General: No tenderness. Normal range of motion.     Cervical back: Normal range of motion and neck supple.  Lymphadenopathy:      Cervical: No cervical adenopathy.  Skin:    General: Skin is warm.     Findings: No rash.  Neurological:     Mental Status: She is alert and oriented to person, place, and time.     Cranial Nerves: No cranial nerve deficit.     Deep Tendon Reflexes: Reflexes normal.  Psychiatric:  Mood and Affect: Mood is not anxious or depressed.    Wt Readings from Last 3 Encounters:  11/26/20 244 lb (110.7 kg)  07/15/20 235 lb (106.6 kg)  06/27/20 230 lb (104.3 kg)    BP 118/78   Pulse 73   Ht '5\' 6"'$  (1.676 m)   Wt 244 lb (110.7 kg)   SpO2 98%   BMI 39.38 kg/m   Assessment and Plan:  1. Observed sleep apnea Chronic.  Patient has had symptoms that have been suggestive of sleep apnea for several years but has recently become more of an issue.  Today Epworth sleepiness scale was noted to be 8.  Patient has had witnessed apneic episodes by her husband and increasing daytime somnolence.  We will refer to pulmonary for evaluation and arrangement of sleep study for evaluation for suspected sleep apnea.  Obstructive or central. - Ambulatory referral to Pulmonology  2. Tobacco dependence Patient has been advised of the health risks of smoking and counseled concerning cessation of tobacco products. I spent over 3 minutes for discussion and to answer questions.   3. Weight gain Health risks of being over weight were discussed and patient was counseled on weight loss options and exercise.  Patient has been given Mediterranean diet.  4. History of fibromyalgia Patient was seen initially with a diagnosis of fibromyalgia from [facility].  Patient does not recall whether the diagnosis came from her endocrinologist/rheumatologist/or neurologist but we have no information at this time told patient that it would be helpful if we could send for this information in for backup of this diagnosis.  In the meantime patient would like a referral to rheumatology to a Derm doctor for evaluation  of her fibromyalgia and determination of further diagnostic and therapeutic endeavors. - Ambulatory referral to Rheumatology

## 2020-11-26 NOTE — Patient Instructions (Signed)

## 2020-12-04 ENCOUNTER — Telehealth: Payer: Self-pay | Admitting: Family Medicine

## 2020-12-04 NOTE — Telephone Encounter (Signed)
Pt states she thought Dr Ronnald Ramp was going to send in a muscle relaxer for her.  She states it is not at the pharmacy.  [place], [place] [address] [facility] [place]

## 2020-12-05 ENCOUNTER — Other Ambulatory Visit: Payer: Self-pay

## 2020-12-06 ENCOUNTER — Other Ambulatory Visit: Payer: Self-pay | Admitting: Family Medicine

## 2020-12-06 NOTE — Telephone Encounter (Signed)
Requested medications are due for refill today yes  Requested medications are on the active medication list yes  Last refill 04/22/2020  Last visit 8/2  Future visit scheduled 9/6  Notes to clinic Not Delegated.

## 2020-12-09 ENCOUNTER — Other Ambulatory Visit: Payer: Self-pay

## 2020-12-09 DIAGNOSIS — M62830 Muscle spasm of back: Secondary | ICD-10-CM

## 2020-12-09 MED ORDER — CYCLOBENZAPRINE HCL 10 MG PO TABS: 10.0000 mg | ORAL_TABLET | Freq: Every day | ORAL | 0 refills | Status: DC

## 2020-12-09 NOTE — Progress Notes (Signed)
Flexeril sent in

## 2020-12-16 ENCOUNTER — Telehealth: Payer: Self-pay

## 2020-12-16 NOTE — Telephone Encounter (Signed)
Copied from [place] [id number]. Topic: General - Other >> Dec 16, 2020  3:04 PM Leward Quan A wrote: Reason for CRM: Patient called in to inform Dr Ronnald Ramp that she was informed by [facility] and they told her that they are not taking any Fibromyalgia patients. Please advise and contact patient at Ph# [phone] or Ph# [phone]

## 2020-12-18 DIAGNOSIS — E669 Obesity, unspecified: Secondary | ICD-10-CM | POA: Diagnosis not present

## 2020-12-18 DIAGNOSIS — F17210 Nicotine dependence, cigarettes, uncomplicated: Secondary | ICD-10-CM | POA: Diagnosis not present

## 2020-12-18 DIAGNOSIS — G4733 Obstructive sleep apnea (adult) (pediatric): Secondary | ICD-10-CM | POA: Diagnosis not present

## 2020-12-26 ENCOUNTER — Other Ambulatory Visit: Payer: Self-pay | Admitting: Family Medicine

## 2020-12-26 DIAGNOSIS — F419 Anxiety disorder, unspecified: Secondary | ICD-10-CM

## 2020-12-26 NOTE — Telephone Encounter (Signed)
Requested Prescriptions  Pending Prescriptions Disp Refills  . busPIRone (BUSPAR) 5 MG tablet [Pharmacy Med Name: BUSPIRONE '5MG'$  TABLETS] 180 tablet 0    Sig: TAKE 1 TABLET BY MOUTH TWICE DAILY     Psychiatry: Anxiolytics/Hypnotics - Non-controlled Passed - 12/26/2020  6:24 AM      Passed - Valid encounter within last 6 months    Recent Outpatient Visits          1 month ago Observed sleep apnea   [facility] Juline Patch, MD   5 months ago [facility] Juline Patch, MD   1 year ago [place], MD   2 years ago Current moderate episode of major depressive disorder, unspecified whether recurrent [facility])   [facility] Juline Patch, MD   2 years ago Tobacco dependence   [place], Deanna C, MD      Future Appointments            In 5 days Juline Patch, MD [facility], [facility]

## 2020-12-31 ENCOUNTER — Encounter: Payer: Self-pay | Admitting: Family Medicine

## 2020-12-31 ENCOUNTER — Other Ambulatory Visit: Payer: Self-pay

## 2020-12-31 ENCOUNTER — Ambulatory Visit (INDEPENDENT_AMBULATORY_CARE_PROVIDER_SITE_OTHER): Payer: BC Managed Care – PPO | Admitting: Family Medicine

## 2020-12-31 VITALS — BP 120/80 | HR 76 | Ht 66.0 in | Wt 241.0 lb

## 2020-12-31 DIAGNOSIS — J302 Other seasonal allergic rhinitis: Secondary | ICD-10-CM

## 2020-12-31 DIAGNOSIS — N951 Menopausal and female climacteric states: Secondary | ICD-10-CM

## 2020-12-31 DIAGNOSIS — F419 Anxiety disorder, unspecified: Secondary | ICD-10-CM | POA: Diagnosis not present

## 2020-12-31 DIAGNOSIS — M62830 Muscle spasm of back: Secondary | ICD-10-CM

## 2020-12-31 DIAGNOSIS — F32A Depression, unspecified: Secondary | ICD-10-CM | POA: Diagnosis not present

## 2020-12-31 DIAGNOSIS — R002 Palpitations: Secondary | ICD-10-CM | POA: Diagnosis not present

## 2020-12-31 DIAGNOSIS — R079 Chest pain, unspecified: Secondary | ICD-10-CM | POA: Diagnosis not present

## 2020-12-31 MED ORDER — ESTRADIOL 0.1 MG/24HR TD PTTW: 1.0000 | MEDICATED_PATCH | TRANSDERMAL | 0 refills | Status: DC

## 2020-12-31 MED ORDER — BUSPIRONE HCL 5 MG PO TABS: ORAL_TABLET | ORAL | 0 refills | Status: DC

## 2020-12-31 MED ORDER — DULOXETINE HCL 30 MG PO CPEP: 30.0000 mg | ORAL_CAPSULE | Freq: Every day | ORAL | 0 refills | Status: DC

## 2020-12-31 MED ORDER — CYCLOBENZAPRINE HCL 10 MG PO TABS: 10.0000 mg | ORAL_TABLET | Freq: Every day | ORAL | 0 refills | Status: DC

## 2020-12-31 MED ORDER — CETIRIZINE HCL 10 MG PO TABS: 10.0000 mg | ORAL_TABLET | Freq: Every day | ORAL | 1 refills | Status: DC

## 2020-12-31 NOTE — Progress Notes (Signed)
Date:  12/31/2020   Name:  Margaret Cuevas   DOB:  04/22/1976   MRN:  [id number]   Chief Complaint: Chest Pain (With palpitations)  Chest Pain  This is a new problem. The current episode started 1 to 4 weeks ago. The problem occurs intermittently. The problem has been unchanged. The pain is present in the lateral region. The pain is at a severity of 7/10. The pain is mild. The quality of the pain is described as pressure and tightness. The pain radiates to the left shoulder. Associated symptoms include headaches, irregular heartbeat, nausea, palpitations and shortness of breath. Pertinent negatives include no abdominal pain, back pain, cough, diaphoresis, dizziness, fever, lower extremity edema, malaise/fatigue, near-syncope, numbness, syncope, vomiting or weakness. The pain is aggravated by nothing. She has tried nothing for the symptoms.  Pertinent negatives for past medical history include no heart disease and no valve disorder.  Palpitations  This is a new problem. The current episode started 1 to 4 weeks ago. The problem occurs 2 to 4 times per day. Nothing aggravates the symptoms. Associated symptoms include chest pain, an irregular heartbeat, nausea and shortness of breath. Pertinent negatives include no anxiety, coughing, diaphoresis, dizziness, fever, malaise/fatigue, near-syncope, numbness, syncope, vomiting or weakness. The treatment provided moderate relief. There is no history of heart disease, hyperthyroidism or a valve disorder.   Lab Results  Component Value Date   CREATININE 0.64 06/27/2020   BUN 18 06/27/2020   NA 136 06/27/2020   K 4.0 06/27/2020   CL 109 06/27/2020   CO2 18 (L) 06/27/2020   Lab Results  Component Value Date   CHOL 223 (H) 07/15/2020   HDL 86 07/15/2020   LDLCALC 119 (H) 07/15/2020   TRIG 105 07/15/2020   Lab Results  Component Value Date   TSH 1.480 07/03/2016   No results found for: HGBA1C Lab Results  Component Value Date   WBC 6.3 06/27/2020    HGB 12.2 06/27/2020   HCT 38.9 06/27/2020   MCV 91.1 06/27/2020   PLT 272 06/27/2020   Lab Results  Component Value Date   ALT 15 06/27/2020   AST 19 06/27/2020   ALKPHOS 55 06/27/2020   BILITOT 0.5 06/27/2020     Review of Systems  Constitutional:  Negative for chills, diaphoresis, fever and malaise/fatigue.  HENT:  Negative for drooling, ear discharge, ear pain and sore throat.   Respiratory:  Positive for shortness of breath. Negative for cough and wheezing.   Cardiovascular:  Positive for chest pain and palpitations. Negative for leg swelling, syncope and near-syncope.  Gastrointestinal:  Positive for nausea. Negative for abdominal pain, blood in stool, constipation, diarrhea and vomiting.  Endocrine: Negative for polydipsia.  Genitourinary:  Negative for dysuria, frequency, hematuria and urgency.  Musculoskeletal:  Negative for back pain, myalgias and neck pain.  Skin:  Negative for rash.  Allergic/Immunologic: Negative for environmental allergies.  Neurological:  Positive for headaches. Negative for dizziness, weakness and numbness.  Hematological:  Does not bruise/bleed easily.  Psychiatric/Behavioral:  Negative for suicidal ideas. The patient is not nervous/anxious.    Patient Active Problem List   Diagnosis Date Noted   H/O total hysterectomy 07/15/2020   History of appendectomy 07/15/2020   Lipoma of back 11/15/2018   Fibromyalgia 10/25/2017   Influenza vaccine needed 01/20/2017    Allergies  Allergen Reactions   Ibuprofen Other (See Comments)    Other Reaction: Other reaction-swells    Past Surgical History:  Procedure Laterality Date  ABDOMINAL HYSTERECTOMY     ANKLE FRACTURE SURGERY     plates and screws placed on both sides   APPENDECTOMY     BACK SURGERY     CESAREAN SECTION     CHOLECYSTECTOMY     EAR TUBE REMOVAL     GASTRIC BYPASS  2006   ORIF FINGER / THUMB FRACTURE     screws in thumb    Social History   Tobacco Use   Smoking  status: Some Days    Types: Cigarettes   Smokeless tobacco: Never  Substance Use Topics   Alcohol use: Yes   Drug use: No     Medication list has been reviewed and updated.  Current Meds  Medication Sig   busPIRone (BUSPAR) 5 MG tablet TAKE 1 TABLET BY MOUTH TWICE DAILY   cetirizine (ZYRTEC) 10 MG tablet Take 1 tablet (10 mg total) by mouth daily.   cyclobenzaprine (FLEXERIL) 10 MG tablet Take 1 tablet (10 mg total) by mouth at bedtime.   DULoxetine (CYMBALTA) 30 MG capsule Take 1 capsule (30 mg total) by mouth daily. Change to 1 - 2 as needed for pain.   estradiol (VIVELLE-DOT) 0.1 MG/24HR patch Place onto the skin. Dr Ouida Sills   [DISCONTINUED] estradiol (VIVELLE-DOT) 0.05 MG/24HR patch UNWRAP AND APPLY 1 PATCH TO SKIN TWICE PER WEEK (Patient taking differently: UNWRAP AND APPLY 1 PATCH TO SKIN TWICE PER WEEK/ schermerhorn)    PHQ 2/9 Scores 07/15/2020 12/27/2019 11/07/2018 03/17/2018  PHQ - 2 Score 3 1 0 3  PHQ- 9 Score '9 7 2 7    '$ GAD 7 : Generalized Anxiety Score 07/15/2020 12/27/2019 11/07/2018  Nervous, Anxious, on Edge 0 1 0  Control/stop worrying 1 0 0  Worry too much - different things 1 0 0  Trouble relaxing 3 0 1  Restless 0 0 1  Easily annoyed or irritable '3 1 1  '$ Afraid - awful might happen 0 0 0  Total GAD 7 Score '8 2 3  '$ Anxiety Difficulty Not difficult at all Somewhat difficult Not difficult at all    BP Readings from Last 3 Encounters:  12/31/20 120/80  11/26/20 118/78  07/15/20 (!) 130/98    Physical Exam Vitals and nursing note reviewed.  Constitutional:      Appearance: She is well-developed.  HENT:     Head: Normocephalic.     Right Ear: External ear normal.     Left Ear: External ear normal.  Eyes:     General: Lids are everted, no foreign bodies appreciated. No scleral icterus.       Left eye: No foreign body or hordeolum.     Conjunctiva/sclera: Conjunctivae normal.     Right eye: Right conjunctiva is not injected.     Left eye: Left  conjunctiva is not injected.     Pupils: Pupils are equal, round, and reactive to light.  Neck:     Thyroid: No thyromegaly.     Vascular: No JVD.     Trachea: No tracheal deviation.  Cardiovascular:     Rate and Rhythm: Normal rate and regular rhythm.     Heart sounds: Normal heart sounds, S1 normal and S2 normal. No murmur heard. No systolic murmur is present.  No diastolic murmur is present.    No friction rub. No gallop. No S3 or S4 sounds.  Pulmonary:     Effort: Pulmonary effort is normal. No respiratory distress.     Breath sounds: Normal breath sounds. No wheezing or rales.  Abdominal:     General: Bowel sounds are normal.     Palpations: Abdomen is soft. There is no mass.     Tenderness: There is no abdominal tenderness. There is no guarding or rebound.  Musculoskeletal:        General: No tenderness. Normal range of motion.     Cervical back: Normal range of motion and neck supple.     Right lower leg: 1+ Pitting Edema present.     Left lower leg: 1+ Pitting Edema present.  Lymphadenopathy:     Cervical: No cervical adenopathy.  Skin:    General: Skin is warm.     Findings: No rash.  Neurological:     Mental Status: She is alert and oriented to person, place, and time.     Cranial Nerves: No cranial nerve deficit.     Deep Tendon Reflexes: Reflexes normal.  Psychiatric:        Mood and Affect: Mood is not anxious or depressed.    Wt Readings from Last 3 Encounters:  12/31/20 241 lb (109.3 kg)  11/26/20 244 lb (110.7 kg)  07/15/20 235 lb (106.6 kg)    BP 120/80   Pulse 76   Ht '5\' 6"'$  (1.676 m)   Wt 241 lb (109.3 kg)   BMI 38.90 kg/m   Assessment and Plan:  1. Palpitations New onset.  Episodic.  Stable.  Patient began having palpitations over the last 4 weeks.  They last about a minute and not associated with any specific concerns such as dyspnea or chest discomfort.  EKG was performed with the following results:I have reviewed EKG which shows normal sinus  rhythm rate 67 intervals normal axis normal no LVH criteria by voltage.  No ischemic changes such as Q waves ST-T wave changes or delay in R wave progression.. Comparison to previous EKG dated no EKG for comparison available.  Given the palpitations have increased over the past 4 weeks and around a daily basis we will refer to cardiology for evaluation.  Review of previous labs were unremarkable we will add a TSH if there is a possibility of hyperthyroidism. - EKG 12-Lead - TSH - Ambulatory referral to Cardiology  2. Chest pain, unspecified type New onset.  Episodic.  Stable.  EKG noted above.  Discomfort described in the lateral area which is not really of a cardiac region.  But EKG notes no ischemic changes and we will refer to cardiology for palpitation evaluation but will note that she also had discomfort at times.  - Ambulatory referral to Cardiology  3. Anxiety Noted and buspirone refilled for 30 days - busPIRone (BUSPAR) 5 MG tablet; TAKE 1 TABLET BY MOUTH TWICE DAILY  Dispense: 180 tablet; Refill: 0  4. Depression, unspecified depression type Cymbalta refilled for 30 days - DULoxetine (CYMBALTA) 30 MG capsule; Take 1 capsule (30 mg total) by mouth daily. Change to 1 - 2 as needed for pain.  Dispense: 135 capsule; Refill: 0  5. Muscle spasm of back Muscle spasm/muscle relaxant refilled for 30 days - cyclobenzaprine (FLEXERIL) 10 MG tablet; Take 1 tablet (10 mg total) by mouth at bedtime.  Dispense: 90 tablet; Refill: 0  6. Seasonal allergic rhinitis, unspecified trigger Zyrtec refilled for 30 days - cetirizine (ZYRTEC) 10 MG tablet; Take 1 tablet (10 mg total) by mouth daily.  Dispense: 90 tablet; Refill: 1

## 2021-01-01 LAB — TSH: TSH: 2.65 u[IU]/mL (ref 0.450–4.500)

## 2021-01-04 DIAGNOSIS — G4733 Obstructive sleep apnea (adult) (pediatric): Secondary | ICD-10-CM | POA: Diagnosis not present

## 2021-01-08 ENCOUNTER — Other Ambulatory Visit: Payer: Self-pay

## 2021-01-08 ENCOUNTER — Encounter: Payer: Self-pay | Admitting: Family Medicine

## 2021-01-08 ENCOUNTER — Ambulatory Visit: Payer: BC Managed Care – PPO | Admitting: Family Medicine

## 2021-01-08 ENCOUNTER — Ambulatory Visit (INDEPENDENT_AMBULATORY_CARE_PROVIDER_SITE_OTHER): Payer: BC Managed Care – PPO | Admitting: Family Medicine

## 2021-01-08 VITALS — BP 130/86 | HR 68 | Ht 66.0 in | Wt 241.0 lb

## 2021-01-08 DIAGNOSIS — F419 Anxiety disorder, unspecified: Secondary | ICD-10-CM

## 2021-01-08 DIAGNOSIS — R739 Hyperglycemia, unspecified: Secondary | ICD-10-CM | POA: Diagnosis not present

## 2021-01-08 DIAGNOSIS — Z23 Encounter for immunization: Secondary | ICD-10-CM | POA: Diagnosis not present

## 2021-01-08 DIAGNOSIS — E7801 Familial hypercholesterolemia: Secondary | ICD-10-CM

## 2021-01-08 MED ORDER — BUSPIRONE HCL 7.5 MG PO TABS: ORAL_TABLET | ORAL | 0 refills | Status: DC

## 2021-01-08 NOTE — Progress Notes (Addendum)
Date:  01/08/2021   Name:  Margaret Cuevas   DOB:  07/16/1975   MRN:  [id number]   Chief Complaint: Anxiety (6 and 9- wants to increase to 7.5) and Flu Vaccine  Anxiety Presents for follow-up visit. Symptoms include chest pain, excessive worry, insomnia, nervous/anxious behavior and palpitations. Patient reports no compulsions, confusion, decreased concentration, depressed mood, dizziness, dry mouth, feeling of choking, hyperventilation, impotence, irritability, malaise, muscle tension, nausea, obsessions, panic, restlessness, shortness of breath or suicidal ideas. Symptoms occur most days. The severity of symptoms is moderate. The patient sleeps 4 hours per night. The quality of sleep is good. Nighttime awakenings: several.     Lab Results  Component Value Date   CREATININE 0.64 06/27/2020   BUN 18 06/27/2020   NA 136 06/27/2020   K 4.0 06/27/2020   CL 109 06/27/2020   CO2 18 (L) 06/27/2020   Lab Results  Component Value Date   CHOL 223 (H) 07/15/2020   HDL 86 07/15/2020   LDLCALC 119 (H) 07/15/2020   TRIG 105 07/15/2020   Lab Results  Component Value Date   TSH 2.650 12/31/2020   No results found for: HGBA1C Lab Results  Component Value Date   WBC 6.3 06/27/2020   HGB 12.2 06/27/2020   HCT 38.9 06/27/2020   MCV 91.1 06/27/2020   PLT 272 06/27/2020   Lab Results  Component Value Date   ALT 15 06/27/2020   AST 19 06/27/2020   ALKPHOS 55 06/27/2020   BILITOT 0.5 06/27/2020     Review of Systems  Constitutional:  Negative for chills, fever and irritability.  HENT:  Negative for ear discharge, ear pain and sore throat.   Respiratory:  Negative for cough, shortness of breath and wheezing.   Cardiovascular:  Positive for chest pain and palpitations. Negative for leg swelling.  Gastrointestinal:  Negative for abdominal pain, blood in stool, constipation, diarrhea and nausea.  Endocrine: Negative for polydipsia.  Genitourinary:  Negative for dysuria, frequency,  impotence and urgency.  Musculoskeletal:  Positive for myalgias. Negative for back pain and neck pain.  Skin:  Negative for rash.  Neurological:  Positive for headaches. Negative for dizziness.  Hematological:  Bruises/bleeds easily.  Psychiatric/Behavioral:  Negative for confusion, decreased concentration and suicidal ideas. The patient is nervous/anxious and has insomnia.    Patient Active Problem List   Diagnosis Date Noted   H/O total hysterectomy 07/15/2020   History of appendectomy 07/15/2020   Lipoma of back 11/15/2018   Fibromyalgia 10/25/2017   Influenza vaccine needed 01/20/2017    Allergies  Allergen Reactions   Ibuprofen Other (See Comments)    Other Reaction: Other reaction-swells    Past Surgical History:  Procedure Laterality Date   ABDOMINAL HYSTERECTOMY     ANKLE FRACTURE SURGERY     plates and screws placed on both sides   APPENDECTOMY     BACK SURGERY     CESAREAN SECTION     CHOLECYSTECTOMY     EAR TUBE REMOVAL     GASTRIC BYPASS  2006   ORIF FINGER / THUMB FRACTURE     screws in thumb    Social History   Tobacco Use   Smoking status: Some Days    Types: Cigarettes   Smokeless tobacco: Never  Substance Use Topics   Alcohol use: Yes   Drug use: No     Medication list has been reviewed and updated.  Current Meds  Medication Sig   busPIRone (BUSPAR) 5 MG tablet  TAKE 1 TABLET BY MOUTH TWICE DAILY   cetirizine (ZYRTEC) 10 MG tablet Take 1 tablet (10 mg total) by mouth daily.   cyclobenzaprine (FLEXERIL) 10 MG tablet Take 1 tablet (10 mg total) by mouth at bedtime.   DULoxetine (CYMBALTA) 30 MG capsule Take 1 capsule (30 mg total) by mouth daily. Change to 1 - 2 as needed for pain.   estradiol (VIVELLE-DOT) 0.1 MG/24HR patch Place 1 patch (0.1 mg total) onto the skin 2 (two) times a week. Dr Schermerhorn    [facility] 2/9 Scores 01/08/2021 07/15/2020 12/27/2019 11/07/2018  PHQ - 2 Score 0 3 1 0  PHQ- 9 Score '6 9 7 2    '$ GAD 7 : Generalized Anxiety  Score 01/08/2021 07/15/2020 12/27/2019 11/07/2018  Nervous, Anxious, on Edge 1 0 1 0  Control/stop worrying 1 1 0 0  Worry too much - different things 1 1 0 0  Trouble relaxing 3 3 0 1  Restless 0 0 0 1  Easily annoyed or irritable '3 3 1 1  '$ Afraid - awful might happen 0 0 0 0  Total GAD 7 Score '9 8 2 3  '$ Anxiety Difficulty Somewhat difficult Not difficult at all Somewhat difficult Not difficult at all    BP Readings from Last 3 Encounters:  12/31/20 120/80  11/26/20 118/78  07/15/20 (!) 130/98    Physical Exam Vitals and nursing note reviewed.  Constitutional:      Appearance: She is well-developed.  HENT:     Head: Normocephalic.     Right Ear: Tympanic membrane, ear canal and external ear normal.     Left Ear: Tympanic membrane, ear canal and external ear normal.     Nose: Nose normal.  Eyes:     General: Lids are everted, no foreign bodies appreciated. No scleral icterus.       Left eye: No foreign body or hordeolum.     Conjunctiva/sclera: Conjunctivae normal.     Right eye: Right conjunctiva is not injected.     Left eye: Left conjunctiva is not injected.     Pupils: Pupils are equal, round, and reactive to light.  Neck:     Thyroid: No thyromegaly.     Vascular: No JVD.     Trachea: No tracheal deviation.  Cardiovascular:     Rate and Rhythm: Normal rate and regular rhythm.     Heart sounds: Normal heart sounds, S1 normal and S2 normal. No murmur heard. No systolic murmur is present.  No diastolic murmur is present.    No friction rub. No gallop. No S3 or S4 sounds.  Pulmonary:     Effort: Pulmonary effort is normal. No respiratory distress.     Breath sounds: Normal breath sounds. No decreased breath sounds, wheezing, rhonchi or rales.  Chest:     Chest wall: Tenderness present.    Abdominal:     General: Bowel sounds are normal.     Palpations: Abdomen is soft. There is no mass.     Tenderness: There is no abdominal tenderness. There is no guarding or rebound.   Musculoskeletal:        General: No tenderness. Normal range of motion.     Cervical back: Normal range of motion and neck supple.  Lymphadenopathy:     Cervical: No cervical adenopathy.  Skin:    General: Skin is warm.     Findings: No rash.  Neurological:     Mental Status: She is alert and oriented to person, place,  and time.     Cranial Nerves: No cranial nerve deficit.     Deep Tendon Reflexes: Reflexes normal.  Psychiatric:        Mood and Affect: Mood is not anxious or depressed.    Wt Readings from Last 3 Encounters:  01/08/21 241 lb (109.3 kg)  12/31/20 241 lb (109.3 kg)  11/26/20 244 lb (110.7 kg)    Ht '5\' 6"'$  (1.676 m)   Wt 241 lb (109.3 kg)   BMI 38.90 kg/m   Assessment and Plan:  1. Anxiety Chronic.  Controlled.  Stable.  Continue buspirone 7.5 mg 1 twice a day. - busPIRone (BUSPAR) 7.5 MG tablet; TAKE 1 TABLET BY MOUTH TWICE DAILY  Dispense: 60 tablet; Refill: 0  2. Need for immunization against influenza Gust and administered. - Flu Vaccine QUAD 69moIM (Fluarix, Fluzone & Alfiuria Quad PF)  3. Hyperglycemia Noted some mild elevation and previous renal function panels and we will recheck renal function panel for glucose as well as an A1c to see if there may be prediabetic concerns. - Renal Function Panel - Hemoglobin A1c  4. Familial hypercholesterolemia Chronic.  Controlled.  Patient is currently in a dietary approach and we will check lipid panel for current level of control. - Lipid Panel With LDL/HDL Ratio   Easy bruising and muscle tension/headaches was erroneously marked as negative.  Patient has been offered appointment for evaluation for bruising and headaches.

## 2021-01-09 LAB — RENAL FUNCTION PANEL
Albumin: 4.2 g/dL (ref 3.8–4.8)
BUN/Creatinine Ratio: 20 (ref 9–23)
BUN: 15 mg/dL (ref 6–24)
CO2: 24 mmol/L (ref 20–29)
Calcium: 9.5 mg/dL (ref 8.7–10.2)
Chloride: 102 mmol/L (ref 96–106)
Creatinine, Ser: 0.74 mg/dL (ref 0.57–1.00)
Glucose: 98 mg/dL (ref 65–99)
Phosphorus: 4.1 mg/dL (ref 3.0–4.3)
Potassium: 5.1 mmol/L (ref 3.5–5.2)
Sodium: 141 mmol/L (ref 134–144)
eGFR: 103 mL/min/{1.73_m2} (ref >59)

## 2021-01-09 LAB — LIPID PANEL WITH LDL/HDL RATIO
Cholesterol, Total: 237 mg/dL — ABNORMAL HIGH (ref 100–199)
HDL: 86 mg/dL (ref >39)
LDL Chol Calc (NIH): 130 mg/dL — ABNORMAL HIGH (ref 0–99)
LDL/HDL Ratio: 1.5 ratio (ref 0.0–3.2)
Triglycerides: 120 mg/dL (ref 0–149)
VLDL Cholesterol Cal: 21 mg/dL (ref 5–40)

## 2021-01-09 LAB — HEMOGLOBIN A1C
Est. average glucose Bld gHb Est-mCnc: 120 mg/dL
Hgb A1c MFr Bld: 5.8 % — ABNORMAL HIGH (ref 4.8–5.6)

## 2021-01-15 DIAGNOSIS — M791 Myalgia, unspecified site: Secondary | ICD-10-CM | POA: Diagnosis not present

## 2021-01-15 DIAGNOSIS — R519 Headache, unspecified: Secondary | ICD-10-CM | POA: Diagnosis not present

## 2021-01-15 DIAGNOSIS — M2550 Pain in unspecified joint: Secondary | ICD-10-CM | POA: Diagnosis not present

## 2021-01-15 DIAGNOSIS — M797 Fibromyalgia: Secondary | ICD-10-CM | POA: Diagnosis not present

## 2021-01-15 DIAGNOSIS — M19041 Primary osteoarthritis, right hand: Secondary | ICD-10-CM | POA: Diagnosis not present

## 2021-01-15 DIAGNOSIS — M7989 Other specified soft tissue disorders: Secondary | ICD-10-CM | POA: Diagnosis not present

## 2021-01-23 DIAGNOSIS — R079 Chest pain, unspecified: Secondary | ICD-10-CM | POA: Diagnosis not present

## 2021-01-23 DIAGNOSIS — G4733 Obstructive sleep apnea (adult) (pediatric): Secondary | ICD-10-CM | POA: Diagnosis not present

## 2021-01-23 DIAGNOSIS — E669 Obesity, unspecified: Secondary | ICD-10-CM | POA: Diagnosis not present

## 2021-01-23 DIAGNOSIS — R002 Palpitations: Secondary | ICD-10-CM | POA: Diagnosis not present

## 2021-01-31 DIAGNOSIS — G4733 Obstructive sleep apnea (adult) (pediatric): Secondary | ICD-10-CM | POA: Diagnosis not present

## 2021-02-10 DIAGNOSIS — R002 Palpitations: Secondary | ICD-10-CM | POA: Diagnosis not present

## 2021-02-10 DIAGNOSIS — R079 Chest pain, unspecified: Secondary | ICD-10-CM | POA: Diagnosis not present

## 2021-02-19 ENCOUNTER — Encounter: Payer: Self-pay | Admitting: Family Medicine

## 2021-02-25 ENCOUNTER — Ambulatory Visit (INDEPENDENT_AMBULATORY_CARE_PROVIDER_SITE_OTHER): Payer: BC Managed Care – PPO | Admitting: Family Medicine

## 2021-02-25 ENCOUNTER — Encounter: Payer: Self-pay | Admitting: Family Medicine

## 2021-02-25 ENCOUNTER — Other Ambulatory Visit: Payer: Self-pay

## 2021-02-25 VITALS — BP 110/80 | HR 64 | Ht 66.0 in | Wt 242.0 lb

## 2021-02-25 DIAGNOSIS — D171 Benign lipomatous neoplasm of skin and subcutaneous tissue of trunk: Secondary | ICD-10-CM | POA: Diagnosis not present

## 2021-02-25 DIAGNOSIS — Z1231 Encounter for screening mammogram for malignant neoplasm of breast: Secondary | ICD-10-CM | POA: Diagnosis not present

## 2021-02-25 NOTE — Progress Notes (Signed)
Date:  02/25/2021   Name:  Margaret Cuevas   DOB:  1975-06-21   MRN:  [id number]   Chief Complaint: breast exam  Patient is a 45 year old female who presents for a breast cancer screening exam. The patient reports the following problems:breast screening . Health maintenance has been reviewed needs mammogram.     Lab Results  Component Value Date   CREATININE 0.74 01/08/2021   BUN 15 01/08/2021   NA 141 01/08/2021   K 5.1 01/08/2021   CL 102 01/08/2021   CO2 24 01/08/2021   Lab Results  Component Value Date   CHOL 237 (H) 01/08/2021   HDL 86 01/08/2021   LDLCALC 130 (H) 01/08/2021   TRIG 120 01/08/2021   Lab Results  Component Value Date   TSH 2.650 12/31/2020   Lab Results  Component Value Date   HGBA1C 5.8 (H) 01/08/2021   Lab Results  Component Value Date   WBC 6.3 06/27/2020   HGB 12.2 06/27/2020   HCT 38.9 06/27/2020   MCV 91.1 06/27/2020   PLT 272 06/27/2020   Lab Results  Component Value Date   ALT 15 06/27/2020   AST 19 06/27/2020   ALKPHOS 55 06/27/2020   BILITOT 0.5 06/27/2020     Review of Systems  Constitutional:  Negative for chills and fever.  HENT:  Negative for drooling, ear discharge, ear pain and sore throat.   Respiratory:  Negative for cough, shortness of breath and wheezing.   Cardiovascular:  Negative for chest pain, palpitations and leg swelling.  Gastrointestinal:  Negative for abdominal pain, blood in stool, constipation, diarrhea and nausea.  Endocrine: Negative for polydipsia.  Genitourinary:  Negative for dysuria, frequency, hematuria and urgency.  Musculoskeletal:  Negative for back pain, myalgias and neck pain.  Skin:  Negative for rash.  Allergic/Immunologic: Negative for environmental allergies.  Neurological:  Negative for dizziness and headaches.  Hematological:  Does not bruise/bleed easily.  Psychiatric/Behavioral:  Negative for suicidal ideas. The patient is not nervous/anxious.    Patient Active Problem List    Diagnosis Date Noted   H/O total hysterectomy 07/15/2020   History of appendectomy 07/15/2020   Lipoma of back 11/15/2018   Fibromyalgia 10/25/2017   Influenza vaccine needed 01/20/2017    Allergies  Allergen Reactions   Ibuprofen Other (See Comments)    Other Reaction: Other reaction-swells    Past Surgical History:  Procedure Laterality Date   ABDOMINAL HYSTERECTOMY     ANKLE FRACTURE SURGERY     plates and screws placed on both sides   APPENDECTOMY     BACK SURGERY     CESAREAN SECTION     CHOLECYSTECTOMY     EAR TUBE REMOVAL     GASTRIC BYPASS  2006   ORIF FINGER / THUMB FRACTURE     screws in thumb    Social History   Tobacco Use   Smoking status: Some Days    Types: Cigarettes   Smokeless tobacco: Never  Substance Use Topics   Alcohol use: Yes   Drug use: No     Medication list has been reviewed and updated.  Current Meds  Medication Sig   busPIRone (BUSPAR) 7.5 MG tablet TAKE 1 TABLET BY MOUTH TWICE DAILY   celecoxib (CELEBREX) 100 MG capsule Take 1 capsule by mouth 2 (two) times daily. Rheum   cetirizine (ZYRTEC) 10 MG tablet Take 1 tablet (10 mg total) by mouth daily.   cyclobenzaprine (FLEXERIL) 10 MG tablet Take  1 tablet (10 mg total) by mouth at bedtime.   DULoxetine (CYMBALTA) 30 MG capsule Take 1 capsule (30 mg total) by mouth daily. Change to 1 - 2 as needed for pain.   estradiol (VIVELLE-DOT) 0.1 MG/24HR patch Place 1 patch (0.1 mg total) onto the skin 2 (two) times a week. Dr Schermerhorn   gabapentin (NEURONTIN) 100 MG capsule Take 100 mg by mouth 3 (three) times daily. Rheum    PHQ 2/9 Scores 01/08/2021 07/15/2020 12/27/2019 11/07/2018  PHQ - 2 Score 0 3 1 0  PHQ- 9 Score 6 9 7 2     GAD 7 : Generalized Anxiety Score 01/08/2021 07/15/2020 12/27/2019 11/07/2018  Nervous, Anxious, on Edge 1 0 1 0  Control/stop worrying 1 1 0 0  Worry too much - different things 1 1 0 0  Trouble relaxing 3 3 0 1  Restless 0 0 0 1  Easily annoyed or irritable 3 3 1  1   Afraid - awful might happen 0 0 0 0  Total GAD 7 Score 9 8 2 3   Anxiety Difficulty Somewhat difficult Not difficult at all Somewhat difficult Not difficult at all    BP Readings from Last 3 Encounters:  02/25/21 110/80  01/08/21 130/86  12/31/20 120/80    Physical Exam Vitals and nursing note reviewed.  Chest:     Chest wall: No mass or deformity.  Breasts:    Right: Tenderness present. No swelling, bleeding, inverted nipple, mass, nipple discharge or skin change.     Left: Tenderness present. No swelling, bleeding, inverted nipple, mass, nipple discharge or skin change.  Lymphadenopathy:     Head:     Right side of head: No submental, submandibular or tonsillar adenopathy.     Left side of head: No submental, submandibular or tonsillar adenopathy.     Cervical: No cervical adenopathy.     Right cervical: No superficial, deep or posterior cervical adenopathy.    Left cervical: No superficial, deep or posterior cervical adenopathy.     Upper Body:     Right upper body: No supraclavicular or axillary adenopathy.     Left upper body: No supraclavicular or axillary adenopathy.     Comments: Tender flexor aspect right lower arm/ ? Patient notes fullness back near lipoma site.    Wt Readings from Last 3 Encounters:  02/25/21 242 lb (109.8 kg)  01/08/21 241 lb (109.3 kg)  12/31/20 241 lb (109.3 kg)    BP 110/80   Pulse 64   Ht 5\' 6"  (1.676 m)   Wt 242 lb (109.8 kg)   BMI 39.06 kg/m   Assessment and Plan:  1. Encounter for screening mammogram for malignant neoplasm of breast Patient presents for breast exam and scheduling of mammography.  There is no issues except for breast tenderness which is likely secondary to estradiol patch.  Breast exam is negative for any palpable mass and there is no adenopathy.  We will schedule for bilateral breast mammography. - MM 3D SCREEN BREAST BILATERAL  2. Lipoma of torso Patient would like to have the general surgery to recheck area  on the back that may have some residual lipoma that she would like to have addressed.  She also had a place on the flexor surface of the lower right arm which felt more like an lymph node but there was no area of concern such as a nail infection or recent hand infection/.  We will continue to watch this and if continues return to clinic  for reevaluation. - Ambulatory referral to General Surgery

## 2021-02-26 DIAGNOSIS — E669 Obesity, unspecified: Secondary | ICD-10-CM | POA: Diagnosis not present

## 2021-02-26 DIAGNOSIS — R002 Palpitations: Secondary | ICD-10-CM | POA: Diagnosis not present

## 2021-02-26 DIAGNOSIS — G4733 Obstructive sleep apnea (adult) (pediatric): Secondary | ICD-10-CM | POA: Diagnosis not present

## 2021-02-26 DIAGNOSIS — R079 Chest pain, unspecified: Secondary | ICD-10-CM | POA: Diagnosis not present

## 2021-03-03 DIAGNOSIS — G4733 Obstructive sleep apnea (adult) (pediatric): Secondary | ICD-10-CM | POA: Diagnosis not present

## 2021-03-05 ENCOUNTER — Ambulatory Visit: Payer: BC Managed Care – PPO | Admitting: Surgery

## 2021-03-25 ENCOUNTER — Inpatient Hospital Stay: Admission: RE | Admit: 2021-03-25 | Payer: BC Managed Care – PPO | Source: Ambulatory Visit

## 2021-03-25 ENCOUNTER — Ambulatory Visit: Payer: Self-pay

## 2021-03-25 NOTE — Telephone Encounter (Signed)
Called pt back and still has headache 6/10. Took Ibuprofen then later aspirin. Ibuprofen not helpful but aspirin gave some relief. Pt stated she is having spots in her vision. Refused appt and instead stated she will see her neurologist in a month. Ringing in right ear. Asked pt to take BP 128/99. Then 5 minutes later asked pt to check it again and it was 136/101. Pt refused appt. Pt stated she will call back to make one. Pt stated she will take her BP one more time tonight. Advised pt to limit salt intake, avoid processed foods, call 911 for severe headache, any weakness or numbness to either side of face, or arms or legs.  Pt verbalized understanding.

## 2021-03-25 NOTE — Telephone Encounter (Signed)
This am was waking up and laying there with eyes shut and felt like inside of head shaking extremely fast and couldn't open eyelids were heavy. Pain to the back of head and neck. Feels pressure to right eyeball  Reason for Disposition . [0] Systolic BP  >= 263 OR Diastolic >= 785 AND [8] cardiac or neurologic symptoms (e.g., chest pain, difficulty breathing, unsteady gait, blurred vision)  Answer Assessment - Initial Assessment Questions 1. SYMPTOM: "What is the main symptom you are concerned about?" (e.g., weakness, numbness)     Felt like brain was shaking and eye lids were heavy and hard to open. Pressure too her right eye ball 2. ONSET: "When did this start?" (minutes, hours, days; while sleeping)     This am 3. LAST NORMAL: "When was the last time you (the patient) were normal (no symptoms)?"     yesterday 4. PATTERN "Does this come and go, or has it been constant since it started?"  "Is it present now?"     Comes and goes 5. CARDIAC SYMPTOMS: "Have you had any of the following symptoms: chest pain, difficulty breathing, palpitations?"     no 6. NEUROLOGIC SYMPTOMS: "Have you had any of the following symptoms: headache, dizziness, vision loss, double vision, changes in speech, unsteady on your feet?"     no 7. OTHER SYMPTOMS: "Do you have any other symptoms?"     Pain to the back of neck 8. PREGNANCY: "Is there any chance you are pregnant?" "When was your last menstrual period?"  Answer Assessment - Initial Assessment Questions 1. BLOOD PRESSURE: "What is the blood pressure?" "Did you take at least two measurements 5 minutes apart?"     128/99  136/101 - yes 2. ONSET: "When did you take your blood pressure?"     During triage call 3. HOW: "How did you obtain the blood pressure?" (e.g., visiting nurse, autom atic home BP monitor)      4. HISTORY: "Do you have a history of high blood pressure?"     Automatic BP machine 5. MEDICATIONS: "Are you taking any medications for blood  pressure?" "Have you missed any doses recently?"     no 6. OTHER SYMPTOMS: "Do you have any symptoms?" (e.g., headache, chest pain, blurred vision, difficulty breathing, weakness)     Spots  before her eyes, persistent headache, ringing to her right ear. 7. PREGNANCY: "Is there any chance you are pregnant?" "When was your last menstrual period?"  Protocols used: Neurologic Deficit-A-AH, Blood Pressure - High-A-AH

## 2021-03-26 NOTE — Telephone Encounter (Signed)
Noted  Pt refused appt.  KP

## 2021-03-28 ENCOUNTER — Ambulatory Visit: Payer: BC Managed Care – PPO | Admitting: Surgery

## 2021-04-02 ENCOUNTER — Encounter: Payer: Self-pay | Admitting: *Deleted

## 2021-04-02 DIAGNOSIS — G4733 Obstructive sleep apnea (adult) (pediatric): Secondary | ICD-10-CM | POA: Diagnosis not present

## 2021-05-03 DIAGNOSIS — G4733 Obstructive sleep apnea (adult) (pediatric): Secondary | ICD-10-CM | POA: Diagnosis not present

## 2021-05-07 ENCOUNTER — Other Ambulatory Visit: Payer: Self-pay | Admitting: Family Medicine

## 2021-05-07 DIAGNOSIS — F411 Generalized anxiety disorder: Secondary | ICD-10-CM | POA: Diagnosis not present

## 2021-05-07 DIAGNOSIS — G4733 Obstructive sleep apnea (adult) (pediatric): Secondary | ICD-10-CM | POA: Insufficient documentation

## 2021-05-07 DIAGNOSIS — F419 Anxiety disorder, unspecified: Secondary | ICD-10-CM

## 2021-05-07 DIAGNOSIS — R200 Anesthesia of skin: Secondary | ICD-10-CM | POA: Diagnosis not present

## 2021-05-07 DIAGNOSIS — F331 Major depressive disorder, recurrent, moderate: Secondary | ICD-10-CM | POA: Diagnosis not present

## 2021-05-07 DIAGNOSIS — M5489 Other dorsalgia: Secondary | ICD-10-CM | POA: Diagnosis not present

## 2021-05-07 NOTE — Telephone Encounter (Signed)
Requested Prescriptions  Pending Prescriptions Disp Refills   busPIRone (BUSPAR) 7.5 MG tablet [Pharmacy Med Name: BUSPIRONE 7.5MG  TABLETS] 60 tablet 0    Sig: TAKE 1 TABLET BY MOUTH TWICE DAILY     Psychiatry: Anxiolytics/Hypnotics - Non-controlled Passed - 05/07/2021  3:04 PM      Passed - Valid encounter within last 6 months    Recent Outpatient Visits          2 months ago Encounter for screening mammogram for malignant neoplasm of breast   [facility] Juline Patch, MD   3 months ago Need for immunization against influenza   [place], [place]   4 months ago Palpitations   [facility] Juline Patch, MD   5 months ago Observed sleep apnea   [facility] Juline Patch, MD   9 months ago [facility] Juline Patch, MD      Future Appointments            In 1 week Juline Patch, MD [facility], [facility]

## 2021-05-15 ENCOUNTER — Other Ambulatory Visit: Payer: Self-pay | Admitting: Neurology

## 2021-05-15 ENCOUNTER — Ambulatory Visit (INDEPENDENT_AMBULATORY_CARE_PROVIDER_SITE_OTHER): Payer: BC Managed Care – PPO | Admitting: Family Medicine

## 2021-05-15 ENCOUNTER — Encounter: Payer: Self-pay | Admitting: Family Medicine

## 2021-05-15 ENCOUNTER — Other Ambulatory Visit: Payer: Self-pay

## 2021-05-15 VITALS — BP 126/86 | HR 76 | Ht 66.0 in | Wt 249.0 lb

## 2021-05-15 DIAGNOSIS — J302 Other seasonal allergic rhinitis: Secondary | ICD-10-CM

## 2021-05-15 DIAGNOSIS — F419 Anxiety disorder, unspecified: Secondary | ICD-10-CM

## 2021-05-15 DIAGNOSIS — G379 Demyelinating disease of central nervous system, unspecified: Secondary | ICD-10-CM

## 2021-05-15 MED ORDER — CETIRIZINE HCL 10 MG PO TABS: 10.0000 mg | ORAL_TABLET | Freq: Every day | ORAL | 1 refills | Status: DC

## 2021-05-15 MED ORDER — BUSPIRONE HCL 7.5 MG PO TABS: 7.5000 mg | ORAL_TABLET | Freq: Two times a day (BID) | ORAL | 5 refills | Status: DC

## 2021-05-15 MED ORDER — BUSPIRONE HCL 7.5 MG PO TABS: 7.5000 mg | ORAL_TABLET | Freq: Two times a day (BID) | ORAL | 1 refills | Status: DC

## 2021-05-15 NOTE — Progress Notes (Signed)
Date:  05/15/2021   Name:  Margaret Cuevas   DOB:  [birth year]   MRN:  [id number]   Chief Complaint: Allergic Rhinitis  and Anxiety  Anxiety Presents for follow-up visit. Symptoms include excessive worry, irritability and nervous/anxious behavior. Patient reports no chest pain, decreased concentration, depressed mood, dizziness, nausea, palpitations, restlessness, shortness of breath or suicidal ideas. Symptoms occur most days. The severity of symptoms is moderate.    URI  This is a chronic problem. The current episode started more than 1 year ago. The problem has been waxing and waning. There has been no fever. Associated symptoms include congestion and rhinorrhea. Pertinent negatives include no abdominal pain, chest pain, coughing, diarrhea, dysuria, ear pain, headaches, nausea, neck pain, plugged ear sensation, rash, sore throat or wheezing. She has tried antihistamine (zyrtec) for the symptoms.   Lab Results  Component Value Date   NA 141 01/08/2021   K 5.1 01/08/2021   CO2 24 01/08/2021   GLUCOSE 98 01/08/2021   BUN 15 01/08/2021   CREATININE 0.74 01/08/2021   CALCIUM 9.5 01/08/2021   EGFR 103 01/08/2021   GFRNONAA >60 06/27/2020   Lab Results  Component Value Date   CHOL 237 (H) 01/08/2021   HDL 86 01/08/2021   LDLCALC 130 (H) 01/08/2021   TRIG 120 01/08/2021   Lab Results  Component Value Date   TSH 2.650 12/31/2020   Lab Results  Component Value Date   HGBA1C 5.8 (H) 01/08/2021   Lab Results  Component Value Date   WBC 6.3 06/27/2020   HGB 12.2 06/27/2020   HCT 38.9 06/27/2020   MCV 91.1 06/27/2020   PLT 272 06/27/2020   Lab Results  Component Value Date   ALT 15 06/27/2020   AST 19 06/27/2020   ALKPHOS 55 06/27/2020   BILITOT 0.5 06/27/2020   No results found for: 25OHVITD2, 25OHVITD3, VD25OH   Review of Systems  Constitutional:  Positive for irritability. Negative for chills and fever.  HENT:  Positive for congestion and rhinorrhea. Negative for  drooling, ear discharge, ear pain and sore throat.   Respiratory:  Negative for cough, shortness of breath and wheezing.   Cardiovascular:  Negative for chest pain, palpitations and leg swelling.  Gastrointestinal:  Negative for abdominal pain, blood in stool, constipation, diarrhea and nausea.  Endocrine: Negative for polydipsia.  Genitourinary:  Negative for dysuria, frequency, hematuria and urgency.  Musculoskeletal:  Negative for back pain, myalgias and neck pain.  Skin:  Negative for rash.  Allergic/Immunologic: Negative for environmental allergies.  Neurological:  Negative for dizziness and headaches.  Hematological:  Does not bruise/bleed easily.  Psychiatric/Behavioral:  Negative for decreased concentration and suicidal ideas. The patient is nervous/anxious.    Patient Active Problem List   Diagnosis Date Noted   H/O total hysterectomy 07/15/2020   History of appendectomy 07/15/2020   Lipoma of back 11/15/2018   Fibromyalgia 10/25/2017   Influenza vaccine needed 01/20/2017    Allergies  Allergen Reactions   Ibuprofen Other (See Comments)    Other Reaction: Other reaction-swells    Past Surgical History:  Procedure Laterality Date   ABDOMINAL HYSTERECTOMY     ANKLE FRACTURE SURGERY     plates and screws placed on both sides   APPENDECTOMY     BACK SURGERY     CESAREAN SECTION     CHOLECYSTECTOMY     EAR TUBE REMOVAL     GASTRIC BYPASS  2006   ORIF FINGER / THUMB FRACTURE  screws in thumb    Social History   Tobacco Use   Smoking status: Some Days    Types: Cigarettes   Smokeless tobacco: Never  Substance Use Topics   Alcohol use: Yes   Drug use: No     Medication list has been reviewed and updated.  Current Meds  Medication Sig   busPIRone (BUSPAR) 7.5 MG tablet TAKE 1 TABLET BY MOUTH TWICE DAILY   celecoxib (CELEBREX) 100 MG capsule Take 1 capsule by mouth 2 (two) times daily. Rheum   cetirizine (ZYRTEC) 10 MG tablet Take 1 tablet (10 mg total)  by mouth daily.   cyclobenzaprine (FLEXERIL) 10 MG tablet Take 1 tablet (10 mg total) by mouth at bedtime.   DULoxetine (CYMBALTA) 30 MG capsule Take 1 capsule (30 mg total) by mouth daily. Change to 1 - 2 as needed for pain.   estradiol (VIVELLE-DOT) 0.1 MG/24HR patch Place 1 patch (0.1 mg total) onto the skin 2 (two) times a week. Dr Schermerhorn   gabapentin (NEURONTIN) 100 MG capsule Take 100 mg by mouth 3 (three) times daily. Rheum    PHQ 2/9 Scores 05/15/2021 01/08/2021 07/15/2020 12/27/2019  PHQ - 2 Score 1 0 3 1  PHQ- 9 Score '1 6 9 7    ' GAD 7 : Generalized Anxiety Score 05/15/2021 01/08/2021 07/15/2020 12/27/2019  Nervous, Anxious, on Edge 2 1 0 1  Control/stop worrying '2 1 1 ' 0  Worry too much - different things '2 1 1 ' 0  Trouble relaxing 0 3 3 0  Restless 0 0 0 0  Easily annoyed or irritable 0 '3 3 1  ' Afraid - awful might happen 0 0 0 0  Total GAD 7 Score '6 9 8 2  ' Anxiety Difficulty Not difficult at all Somewhat difficult Not difficult at all Somewhat difficult    BP Readings from Last 3 Encounters:  05/15/21 126/86  02/25/21 110/80  01/08/21 130/86    Physical Exam Vitals and nursing note reviewed.  Constitutional:      Appearance: She is well-developed.  HENT:     Head: Normocephalic.     Right Ear: Tympanic membrane and external ear normal.     Left Ear: Tympanic membrane and external ear normal.     Nose: Nose normal.  Eyes:     General: Lids are everted, no foreign bodies appreciated. No scleral icterus.       Left eye: No foreign body or hordeolum.     Conjunctiva/sclera: Conjunctivae normal.     Right eye: Right conjunctiva is not injected.     Left eye: Left conjunctiva is not injected.     Pupils: Pupils are equal, round, and reactive to light.  Neck:     Thyroid: No thyromegaly.     Vascular: No JVD.     Trachea: No tracheal deviation.  Cardiovascular:     Rate and Rhythm: Normal rate and regular rhythm.     Chest Wall: PMI is not displaced.     Pulses:  Normal pulses.     Heart sounds: Normal heart sounds, S1 normal and S2 normal. No murmur heard. No systolic murmur is present.  No diastolic murmur is present.    No friction rub. No gallop. No S3 or S4 sounds.  Pulmonary:     Effort: Pulmonary effort is normal. No respiratory distress.     Breath sounds: Normal breath sounds. No decreased breath sounds, wheezing, rhonchi or rales.  Abdominal:     General: Bowel sounds are  normal.     Palpations: Abdomen is soft. There is no mass.     Tenderness: There is no abdominal tenderness. There is no guarding or rebound.  Musculoskeletal:        General: No tenderness. Normal range of motion.     Cervical back: Normal range of motion and neck supple.  Lymphadenopathy:     Cervical: No cervical adenopathy.  Skin:    General: Skin is warm.     Findings: No rash.  Neurological:     Mental Status: She is alert and oriented to person, place, and time.     Cranial Nerves: No cranial nerve deficit.     Deep Tendon Reflexes: Reflexes normal.  Psychiatric:        Mood and Affect: Mood is not anxious or depressed.    Wt Readings from Last 3 Encounters:  05/15/21 249 lb (112.9 kg)  02/25/21 242 lb (109.8 kg)  01/08/21 241 lb (109.3 kg)    BP 126/86    Pulse 76    Ht '5\' 6"'  (1.676 m)    Wt 249 lb (112.9 kg)    SpO2 95%    BMI 40.19 kg/m   Assessment and Plan:  1. Anxiety Chronic.  Controlled.  Stable.  PHQ is 1.  Gad score 6.  Excessive worry nervous and anxious.  PHQ is 1 Gad score is 6.  Excessive worry and anxious are the primary concerns.  We will recheck in 6 months. - busPIRone (BUSPAR) 7.5 MG tablet; Take 1 tablet (7.5 mg total) by mouth 2 (two) times daily.  Dispense: 180 tablet; Refill: 1  2. Seasonal allergic rhinitis, unspecified trigger Chronic.  Episodic.  Stable.  Patient reports that she takes on a daily basis and sometimes has to take an extra 1 for which I have suggested it may be best to take Benadryl at night.  We will  continue Zyrtec 10 mg once a day and will recheck in 6 months. - cetirizine (ZYRTEC) 10 MG tablet; Take 1 tablet (10 mg total) by mouth daily.  Dispense: 90 tablet; Refill: 1

## 2021-05-21 ENCOUNTER — Other Ambulatory Visit: Payer: Self-pay

## 2021-05-21 ENCOUNTER — Ambulatory Visit
Admission: RE | Admit: 2021-05-21 | Discharge: 2021-05-21 | Disposition: A | Discharge status: Home / Self Care | Payer: BC Managed Care – PPO | Source: Ambulatory Visit | Attending: Family Medicine | Admitting: Family Medicine

## 2021-05-21 DIAGNOSIS — Z1231 Encounter for screening mammogram for malignant neoplasm of breast: Secondary | ICD-10-CM | POA: Insufficient documentation

## 2021-05-22 ENCOUNTER — Inpatient Hospital Stay
Admission: RE | Admit: 2021-05-22 | Discharge: 2021-05-22 | Disposition: A | Discharge status: Home / Self Care | Payer: Self-pay | Source: Ambulatory Visit | Attending: *Deleted | Admitting: *Deleted

## 2021-05-22 ENCOUNTER — Other Ambulatory Visit: Payer: Self-pay | Admitting: *Deleted

## 2021-05-22 ENCOUNTER — Other Ambulatory Visit: Payer: Self-pay | Admitting: Family Medicine

## 2021-05-22 DIAGNOSIS — Z1231 Encounter for screening mammogram for malignant neoplasm of breast: Secondary | ICD-10-CM

## 2021-05-22 DIAGNOSIS — N6320 Unspecified lump in the left breast, unspecified quadrant: Secondary | ICD-10-CM

## 2021-05-22 DIAGNOSIS — R0789 Other chest pain: Secondary | ICD-10-CM | POA: Diagnosis not present

## 2021-05-22 DIAGNOSIS — R002 Palpitations: Secondary | ICD-10-CM | POA: Diagnosis not present

## 2021-05-22 DIAGNOSIS — R928 Other abnormal and inconclusive findings on diagnostic imaging of breast: Secondary | ICD-10-CM

## 2021-05-26 ENCOUNTER — Encounter: Payer: Self-pay | Admitting: Family Medicine

## 2021-06-03 DIAGNOSIS — G4733 Obstructive sleep apnea (adult) (pediatric): Secondary | ICD-10-CM | POA: Diagnosis not present

## 2021-06-10 ENCOUNTER — Ambulatory Visit
Admission: RE | Admit: 2021-06-10 | Discharge: 2021-06-10 | Disposition: A | Discharge status: Home / Self Care | Payer: BC Managed Care – PPO | Source: Ambulatory Visit | Attending: Family Medicine | Admitting: Family Medicine

## 2021-06-10 ENCOUNTER — Other Ambulatory Visit: Payer: Self-pay

## 2021-06-10 DIAGNOSIS — R928 Other abnormal and inconclusive findings on diagnostic imaging of breast: Secondary | ICD-10-CM

## 2021-06-10 DIAGNOSIS — R922 Inconclusive mammogram: Secondary | ICD-10-CM | POA: Diagnosis not present

## 2021-06-10 DIAGNOSIS — N6320 Unspecified lump in the left breast, unspecified quadrant: Secondary | ICD-10-CM

## 2021-06-10 DIAGNOSIS — N644 Mastodynia: Secondary | ICD-10-CM | POA: Diagnosis not present

## 2021-06-16 ENCOUNTER — Other Ambulatory Visit: Payer: Self-pay

## 2021-06-16 ENCOUNTER — Ambulatory Visit: Payer: BC Managed Care – PPO

## 2021-06-16 ENCOUNTER — Ambulatory Visit
Admission: RE | Admit: 2021-06-16 | Discharge: 2021-06-16 | Disposition: A | Discharge status: Home / Self Care | Payer: BC Managed Care – PPO | Source: Ambulatory Visit | Attending: Neurology | Admitting: Neurology

## 2021-06-16 DIAGNOSIS — R200 Anesthesia of skin: Secondary | ICD-10-CM | POA: Diagnosis not present

## 2021-06-16 DIAGNOSIS — G379 Demyelinating disease of central nervous system, unspecified: Secondary | ICD-10-CM | POA: Diagnosis not present

## 2021-06-16 MED ORDER — GADOBUTROL 1 MMOL/ML IV SOLN
10.0000 mL | Freq: Once | INTRAVENOUS | Status: AC | PRN
  Administered 2021-06-16: 10 mL via INTRAVENOUS

## 2021-06-20 ENCOUNTER — Other Ambulatory Visit: Payer: Self-pay | Admitting: Family Medicine

## 2021-06-20 DIAGNOSIS — M62830 Muscle spasm of back: Secondary | ICD-10-CM

## 2021-06-24 DIAGNOSIS — R0789 Other chest pain: Secondary | ICD-10-CM | POA: Diagnosis not present

## 2021-06-26 ENCOUNTER — Other Ambulatory Visit: Payer: Self-pay | Admitting: Family Medicine

## 2021-06-26 DIAGNOSIS — N951 Menopausal and female climacteric states: Secondary | ICD-10-CM

## 2021-06-26 NOTE — Telephone Encounter (Signed)
Medication Refill - Medication:  estradiol (VIVELLE-DOT) 0.1 MG/24HR patch (patient is going out of town and did not realize she did not have any refills and would like PCP to expedite request)  ? ?Has the patient contacted their pharmacy? Yes.   ? ?(Agent: If yes, when and what did the pharmacy advise?) Pharmacy advised to contact PCP office because she has no refills.  ? ?Preferred Pharmacy (with phone number or street name):      ?[place], [place] [address] AT [place] Phone:  [phone]  ?Fax:  [phone]  ?  ? ? ?Has the patient been seen for an appointment in the last year OR does the patient have an upcoming appointment? Yes.   ? ?Agent: Please be advised that RX refills may take up to 3 business days. We ask that you follow-up with your pharmacy. ?

## 2021-06-26 NOTE — Telephone Encounter (Signed)
Requested medication (s) are due for refill today: yes ? ?Requested medication (s) are on the active medication list: yes ? ?Last refill:  01/02/21 #8/0 ? ?Future visit scheduled: yes ? ?Notes to clinic:  mammogram isn't up to date. Please advise ? ? ?  ?Requested Prescriptions  ?Pending Prescriptions Disp Refills  ? estradiol (VIVELLE-DOT) 0.1 MG/24HR patch 8 patch 0  ?  Sig: Place 1 patch (0.1 mg total) onto the skin 2 (two) times a week. Dr Ouida Sills  ?  ? OB/GYN:  Estrogens Failed - 06/26/2021 11:50 AM  ?  ?  Failed - Mammogram is up-to-date per Health Maintenance  ?  ?  Passed - Last BP in normal range  ?  BP Readings from Last 1 Encounters:  ?05/15/21 126/86  ?  ?  ?  ?  Passed - Valid encounter within last 12 months  ?  Recent Outpatient Visits   ? ?      ? 1 month ago Anxiety  ? [facility] Juline Patch, MD  ? 4 months ago Encounter for screening mammogram for malignant neoplasm of breast  ? [facility] Juline Patch, MD  ? 5 months ago Need for immunization against influenza  ? [facility] Juline Patch, MD  ? 5 months ago Palpitations  ? [facility] Juline Patch, MD  ? 7 months ago Observed sleep apnea  ? [facility] Juline Patch, MD  ? ?  ?  ?Future Appointments   ? ?        ? In 4 months Juline Patch, MD [facility], [place]  ? ?  ? ?  ?  ?  ? ?

## 2021-07-01 DIAGNOSIS — G4733 Obstructive sleep apnea (adult) (pediatric): Secondary | ICD-10-CM | POA: Diagnosis not present

## 2021-07-02 DIAGNOSIS — F411 Generalized anxiety disorder: Secondary | ICD-10-CM | POA: Diagnosis not present

## 2021-07-02 DIAGNOSIS — G479 Sleep disorder, unspecified: Secondary | ICD-10-CM | POA: Diagnosis not present

## 2021-07-02 DIAGNOSIS — M5489 Other dorsalgia: Secondary | ICD-10-CM | POA: Diagnosis not present

## 2021-07-02 DIAGNOSIS — G4733 Obstructive sleep apnea (adult) (pediatric): Secondary | ICD-10-CM | POA: Diagnosis not present

## 2021-08-01 DIAGNOSIS — G4733 Obstructive sleep apnea (adult) (pediatric): Secondary | ICD-10-CM | POA: Diagnosis not present

## 2021-08-31 DIAGNOSIS — G4733 Obstructive sleep apnea (adult) (pediatric): Secondary | ICD-10-CM | POA: Diagnosis not present

## 2021-10-01 DIAGNOSIS — G4733 Obstructive sleep apnea (adult) (pediatric): Secondary | ICD-10-CM | POA: Diagnosis not present

## 2021-10-07 ENCOUNTER — Telehealth: Payer: Self-pay | Admitting: Family Medicine

## 2021-10-07 ENCOUNTER — Ambulatory Visit: Payer: Self-pay | Admitting: *Deleted

## 2021-10-07 DIAGNOSIS — F32A Depression, unspecified: Secondary | ICD-10-CM

## 2021-10-07 NOTE — Telephone Encounter (Unsigned)
Copied from [place] [id number]. Topic: General - Other >> Oct 07, 2021 12:16 PM Everette C wrote: Reason for CRM: Medication Refill - Medication: DULoxetine (CYMBALTA) 30 MG capsule [[id number]]   Has the patient contacted their pharmacy? Yes.  The patient has been directed to contact their PCP (Agent: If no, request that the patient contact the pharmacy for the refill. If patient does not wish to contact the pharmacy document the reason why and proceed with request.) (Agent: If yes, when and what did the pharmacy advise?)  Preferred Pharmacy (with phone number or street name): [facility] [place], [place] [address] [place] [address] Phone: [phone] Fax: [phone] Hours: Not open 24 hours  Has the patient been seen for an appointment in the last year OR does the patient have an upcoming appointment? Yes.    Agent: Please be advised that RX refills may take up to 3 business days. We ask that you follow-up with your pharmacy.

## 2021-10-07 NOTE — Telephone Encounter (Signed)
  Chief Complaint: Rectal bleeding Symptoms: H/O hemorrhoids Presently "Hemorrhoids feel like they are the whole way up my rectum"  Pain and pelvis pressure. Blood in stool does not occur with every BM, varies in amount, "Through stool at times, other with wiping only", bright red. States pain radiates through rectum to vagina and pelvis. More gas than usual. Dizzy at times. Also reports chest pain is more frequent; "Cardiologist did not find anything wrong."  Frequency: 3 weeks "Or longer." Pertinent Negatives: Patient denies diarrhea, constipation.   Disposition: '[]'$ ED /'[]'$ Urgent Care (no appt availability in office) / '[x]'$ Appointment(In office/virtual)/ '[]'$  [place] [facility]/ '[]'$ Home Care/ '[]'$ Refused Recommended Disposition /'[]'$ [place] [facility]/ '[]'$  Follow-up with PCP Additional Notes: Appt secured for Thursday according to pts schedule. Advised ED for worsening symptoms.   Reason for Disposition  MODERATE rectal bleeding (small blood clots, passing blood without stool, or toilet water turns red)  Answer Assessment - Initial Assessment Questions 1. APPEARANCE of BLOOD: "What color is it?" "Is it passed separately, on the surface of the stool, or mixed in with the stool?"      Sometimes in stool, sometimes with wiping only 2. AMOUNT: "How much blood was passed?"      Varies 3. FREQUENCY: "How many times has blood been passed with the stools?"      Not with each BM 4. ONSET: "When was the blood first seen in the stools?" (Days or weeks)      3 weeks ago 5. DIARRHEA: "Is there also some diarrhea?" If Yes, ask: "How many diarrhea stools in the past 24 hours?"      No 6. CONSTIPATION: "Do you have constipation?" If Yes, ask: "How bad is it?"     No 7. RECURRENT SYMPTOMS: "Have you had blood in your stools before?" If Yes, ask: "When was the last time?" and "What happened that time?"      Yes "Not like this." 8. BLOOD THINNERS: "Do you take any blood thinners?" (e.g., Coumadin/warfarin,  Pradaxa/dabigatran, aspirin)     No 9. OTHER SYMPTOMS: "Do you have any other symptoms?"  (e.g., abdomen pain, vomiting, dizziness, fever)     Pressure pelvic area, More gas when using BR  Protocols used: Rectal Bleeding-A-AH

## 2021-10-08 NOTE — Telephone Encounter (Signed)
Pt called requesting a call back from nurse best contact: [phone]

## 2021-10-09 ENCOUNTER — Ambulatory Visit: Payer: BC Managed Care – PPO | Admitting: Family Medicine

## 2021-10-09 NOTE — Telephone Encounter (Signed)
Called pt told her that we declined the medication because she takes Cymbalta for pain. Told pt she sees a neurologist for pain that she would need to get the medication filled with her neurologist. Pt stated "so if I don't go to the neurologist I will no longer be there pt and Dr. Ronnald Ramp will be my provider. Will she proscribe me the medication then. She also stated "that she will talk to the provider herself that she is sick of this place that after Monday at her next appt  she may be finding a new provider." Explained again that medication needed to be refilled thru her neurologist. Pt verbalized understanding.  KP

## 2021-10-13 ENCOUNTER — Encounter: Payer: Self-pay | Admitting: Family Medicine

## 2021-10-13 ENCOUNTER — Ambulatory Visit (INDEPENDENT_AMBULATORY_CARE_PROVIDER_SITE_OTHER): Payer: BC Managed Care – PPO | Admitting: Family Medicine

## 2021-10-13 VITALS — BP 120/80 | HR 80 | Ht 66.0 in | Wt 254.0 lb

## 2021-10-13 DIAGNOSIS — R350 Frequency of micturition: Secondary | ICD-10-CM

## 2021-10-13 DIAGNOSIS — K6289 Other specified diseases of anus and rectum: Secondary | ICD-10-CM | POA: Diagnosis not present

## 2021-10-13 DIAGNOSIS — F419 Anxiety disorder, unspecified: Secondary | ICD-10-CM | POA: Diagnosis not present

## 2021-10-13 DIAGNOSIS — K625 Hemorrhage of anus and rectum: Secondary | ICD-10-CM

## 2021-10-13 LAB — POCT URINALYSIS DIPSTICK
Bilirubin, UA: NEGATIVE
Blood, UA: NEGATIVE
Glucose, UA: NEGATIVE
Ketones, UA: NEGATIVE
Leukocytes, UA: NEGATIVE
Nitrite, UA: NEGATIVE
Protein, UA: NEGATIVE
Spec Grav, UA: 1.01 (ref 1.010–1.025)
Urobilinogen, UA: 0.2 E.U./dL
pH, UA: 5 (ref 5.0–8.0)

## 2021-10-13 MED ORDER — BUSPIRONE HCL 7.5 MG PO TABS: 7.5000 mg | ORAL_TABLET | Freq: Two times a day (BID) | ORAL | 1 refills | Status: DC

## 2021-10-13 NOTE — Progress Notes (Addendum)
Date:  10/13/2021   Name:  Margaret Cuevas   DOB:  April 14, 1976   MRN:  [id number]   Chief Complaint: Anxiety and Rectal Pain (Blood in toilet water and when wiping)  Anxiety Presents for follow-up visit. Symptoms include excessive worry and nervous/anxious behavior. Patient reports no compulsions, confusion, decreased concentration, depressed mood, dizziness, dry mouth, feeling of choking, hyperventilation, impotence, insomnia, irritability, malaise, muscle tension, nausea, obsessions, palpitations, panic, restlessness, shortness of breath or suicidal ideas. Symptoms occur occasionally. The severity of symptoms is mild.    Rectal Bleeding  The current episode started more than 2 weeks ago (1 month). The onset was sudden. The problem has been unchanged. The pain is moderate. The stool is described as mixed with blood. Associated symptoms include hemorrhoids and rectal pain. Pertinent negatives include no fever, no nausea, no hematuria and no vaginal bleeding.    Lab Results  Component Value Date   NA 141 01/08/2021   K 5.1 01/08/2021   CO2 24 01/08/2021   GLUCOSE 98 01/08/2021   BUN 15 01/08/2021   CREATININE 0.74 01/08/2021   CALCIUM 9.5 01/08/2021   EGFR 103 01/08/2021   GFRNONAA >60 06/27/2020   Lab Results  Component Value Date   CHOL 237 (H) 01/08/2021   HDL 86 01/08/2021   LDLCALC 130 (H) 01/08/2021   TRIG 120 01/08/2021   Lab Results  Component Value Date   TSH 2.650 12/31/2020   Lab Results  Component Value Date   HGBA1C 5.8 (H) 01/08/2021   Lab Results  Component Value Date   WBC 6.3 06/27/2020   HGB 12.2 06/27/2020   HCT 38.9 06/27/2020   MCV 91.1 06/27/2020   PLT 272 06/27/2020   Lab Results  Component Value Date   ALT 15 06/27/2020   AST 19 06/27/2020   ALKPHOS 55 06/27/2020   BILITOT 0.5 06/27/2020   No results found for: "25OHVITD2", "25OHVITD3", "VD25OH"   Review of Systems  Constitutional:  Positive for chills. Negative for fever and  irritability.  Respiratory:  Negative for shortness of breath.   Cardiovascular:  Negative for palpitations.  Gastrointestinal:  Positive for hematochezia, hemorrhoids and rectal pain. Negative for nausea.  Genitourinary:  Positive for frequency. Negative for dysuria, hematuria, impotence, urgency and vaginal bleeding.  Neurological:  Negative for dizziness.  Psychiatric/Behavioral:  Negative for confusion, decreased concentration and suicidal ideas. The patient is nervous/anxious. The patient does not have insomnia.     Patient Active Problem List   Diagnosis Date Noted   H/O total hysterectomy 07/15/2020   History of appendectomy 07/15/2020   Lipoma of back 11/15/2018   Fibromyalgia 10/25/2017   Influenza vaccine needed 01/20/2017    Allergies  Allergen Reactions   Ibuprofen Other (See Comments)    Other Reaction: Other reaction-swells    Past Surgical History:  Procedure Laterality Date   ABDOMINAL HYSTERECTOMY     ANKLE FRACTURE SURGERY     plates and screws placed on both sides   APPENDECTOMY     BACK SURGERY     CESAREAN SECTION     CHOLECYSTECTOMY     EAR TUBE REMOVAL     GASTRIC BYPASS  2006   ORIF FINGER / THUMB FRACTURE     screws in thumb    Social History   Tobacco Use   Smoking status: Some Days    Types: Cigarettes   Smokeless tobacco: Never  Substance Use Topics   Alcohol use: Yes   Drug use: No  Medication list has been reviewed and updated.  Current Meds  Medication Sig   busPIRone (BUSPAR) 7.5 MG tablet Take 1 tablet (7.5 mg total) by mouth 2 (two) times daily.   celecoxib (CELEBREX) 100 MG capsule Take 1 capsule by mouth 2 (two) times daily. Rheum   cetirizine (ZYRTEC) 10 MG tablet Take 1 tablet (10 mg total) by mouth daily.   cyclobenzaprine (FLEXERIL) 10 MG tablet TAKE 1 TABLET(10 MG) BY MOUTH AT BEDTIME   DULoxetine (CYMBALTA) 30 MG capsule Take 1 capsule (30 mg total) by mouth daily. Change to 1 - 2 as needed for pain.   gabapentin  (NEURONTIN) 100 MG capsule Take 100 mg by mouth 3 (three) times daily. Rheum   nortriptyline (PAMELOR) 10 MG capsule Increase Nortriptyline to 30 mg nightly for two weeks, then can increase to 40 mg nightly if needed.       10/13/2021    8:04 AM 05/15/2021    8:10 AM 01/08/2021    8:38 AM 07/15/2020    9:31 AM  GAD 7 : Generalized Anxiety Score  Nervous, Anxious, on Edge _0 0  Control/stop worrying _1 Worry too much - different things 0 _2 Trouble relaxing 0 0 3 3  Restless 0 0 0 0  Easily annoyed or irritable 3 0 3 3  Afraid - awful might happen 0 0 0 0  Total GAD 7 Score _3 Anxiety Difficulty Somewhat difficult Not difficult at all Somewhat difficult Not difficult at all       10/13/2021    8:03 AM  Depression screen PHQ 2/9  Decreased Interest 2  Down, Depressed, Hopeless 3  PHQ - 2 Score 5  Altered sleeping 0  Tired, decreased energy 3  Change in appetite 0  Feeling bad or failure about yourself  0  Trouble concentrating 0  Moving slowly or fidgety/restless 0  Suicidal thoughts 0  PHQ-9 Score 8  Difficult doing work/chores Somewhat difficult    BP Readings from Last 3 Encounters:  10/13/21 120/80  05/15/21 126/86  02/25/21 110/80    Physical Exam Vitals and nursing note reviewed. Exam conducted with a chaperone present.  Constitutional:      General: She is not in acute distress.    Appearance: She is not diaphoretic.  HENT:     Head: Normocephalic and atraumatic.     Right Ear: Tympanic membrane and external ear normal.     Left Ear: Tympanic membrane and external ear normal.     Nose: Nose normal. No congestion.  Eyes:     General:        Right eye: No discharge.        Left eye: No discharge.     Conjunctiva/sclera: Conjunctivae normal.     Pupils: Pupils are equal, round, and reactive to light.  Neck:     Thyroid: No thyromegaly.     Vascular: No JVD.  Cardiovascular:     Rate and Rhythm: Normal rate and regular rhythm.      Heart sounds: Normal heart sounds and S1 normal. No murmur heard.    No systolic murmur is present.     No diastolic murmur is present.     No friction rub. No gallop. No S3 or S4 sounds.  Pulmonary:     Effort: Pulmonary effort is normal.     Breath sounds: Normal breath sounds. No wheezing or rhonchi.  Abdominal:  General: Bowel sounds are normal.     Palpations: Abdomen is soft. There is no hepatomegaly, splenomegaly or mass.     Tenderness: There is no abdominal tenderness. There is no guarding.  Genitourinary:    Rectum: Guaiac result negative. Tenderness present. No mass or external hemorrhoid. Normal anal tone.  Musculoskeletal:        General: Normal range of motion.     Cervical back: Normal range of motion and neck supple.  Lymphadenopathy:     Cervical: No cervical adenopathy.  Skin:    General: Skin is warm and dry.  Neurological:     Mental Status: She is alert.     Deep Tendon Reflexes: Reflexes are normal and symmetric.     Wt Readings from Last 3 Encounters:  10/13/21 254 lb (115.2 kg)  05/15/21 249 lb (112.9 kg)  02/25/21 242 lb (109.8 kg)    BP 120/80   Pulse 80   Ht _0  (1.676 m)   Wt 254 lb (115.2 kg)   BMI 41.00 kg/m   Assessment and Plan:  1. Anxiety Chronic.  Controlled.  Stable.  PHQ is 8 Gad score is 6.  Continue buspirone 7.5 twice a day.  We will continue to monitor on a 46-month - busPIRone (BUSPAR) 7.5 MG tablet; Take 1 tablet (7.5 mg total) by mouth 2 (two) times daily.  Dispense: 180 tablet; Refill: 1  2. Rectal bleeding New onset.  Associated with pain.  Stable.  And that this is blood this mixed in stool without clots.  This began within the past month.  Patient attributed that to internal hemorrhoids which were nonpalpable.  Rectal exam was noted to have some tenderness but guaiac was noted to be negative and there was no rectal bleeding noted.  We will refer to GI and that she may be due for colonoscopy as well and we will send for  that information and will check CBC to see if there is been any chronic blood decrease.  3. Anal or rectal pain New onset.  Persistent.  Patient has rectal pain that extends to her back.  We will evaluate with GI consult for possible rectal fissure.  Which could also cause of the above pain. - Ambulatory referral to Gastroenterology - CBC with Differential/Platelet  4. Frequency of urination Patient notes an increase in frequency of urination with darkening.  Urinalysis was done and it was noted to be negative. - POCT urinalysis dipstick   After visit, patient advised [facility] at visit. However, patient has a cardiologist and has been advised to call them for a further work-up and evaluation.

## 2021-10-14 LAB — CBC WITH DIFFERENTIAL/PLATELET
Basophils Absolute: 0 10*3/uL (ref 0.0–0.2)
Basos: 1 %
EOS (ABSOLUTE): 0.2 10*3/uL (ref 0.0–0.4)
Eos: 4 %
Hematocrit: 35.9 % (ref 34.0–46.6)
Hemoglobin: 11.6 g/dL (ref 11.1–15.9)
Immature Grans (Abs): 0 10*3/uL (ref 0.0–0.1)
Immature Granulocytes: 0 %
Lymphocytes Absolute: 1.8 10*3/uL (ref 0.7–3.1)
Lymphs: 39 %
MCH: 29.4 pg (ref 26.6–33.0)
MCHC: 32.3 g/dL (ref 31.5–35.7)
MCV: 91 fL (ref 79–97)
Monocytes Absolute: 0.4 10*3/uL (ref 0.1–0.9)
Monocytes: 8 %
Neutrophils Absolute: 2.3 10*3/uL (ref 1.4–7.0)
Neutrophils: 48 %
Platelets: 237 10*3/uL (ref 150–450)
RBC: 3.95 x10E6/uL (ref 3.77–5.28)
RDW: 13.7 % (ref 11.7–15.4)
WBC: 4.7 10*3/uL (ref 3.4–10.8)

## 2021-10-24 ENCOUNTER — Encounter: Payer: Self-pay | Admitting: Family Medicine

## 2021-10-31 DIAGNOSIS — G4733 Obstructive sleep apnea (adult) (pediatric): Secondary | ICD-10-CM | POA: Diagnosis not present

## 2021-11-04 ENCOUNTER — Ambulatory Visit: Payer: BC Managed Care – PPO | Admitting: Gastroenterology

## 2021-11-08 ENCOUNTER — Encounter: Payer: Self-pay | Admitting: Family Medicine

## 2021-11-12 ENCOUNTER — Ambulatory Visit: Payer: BC Managed Care – PPO | Admitting: Family Medicine

## 2021-11-13 ENCOUNTER — Ambulatory Visit (INDEPENDENT_AMBULATORY_CARE_PROVIDER_SITE_OTHER): Payer: BC Managed Care – PPO | Admitting: Family Medicine

## 2021-11-13 ENCOUNTER — Encounter: Payer: Self-pay | Admitting: Family Medicine

## 2021-11-13 VITALS — BP 122/70 | HR 68 | Ht 66.0 in | Wt 255.0 lb

## 2021-11-13 DIAGNOSIS — Z1272 Encounter for screening for malignant neoplasm of vagina: Secondary | ICD-10-CM | POA: Diagnosis not present

## 2021-11-13 DIAGNOSIS — F419 Anxiety disorder, unspecified: Secondary | ICD-10-CM | POA: Diagnosis not present

## 2021-11-13 DIAGNOSIS — M542 Cervicalgia: Secondary | ICD-10-CM

## 2021-11-13 DIAGNOSIS — N951 Menopausal and female climacteric states: Secondary | ICD-10-CM

## 2021-11-13 DIAGNOSIS — R0789 Other chest pain: Secondary | ICD-10-CM | POA: Diagnosis not present

## 2021-11-13 DIAGNOSIS — J302 Other seasonal allergic rhinitis: Secondary | ICD-10-CM | POA: Diagnosis not present

## 2021-11-13 DIAGNOSIS — Z01411 Encounter for gynecological examination (general) (routine) with abnormal findings: Secondary | ICD-10-CM | POA: Diagnosis not present

## 2021-11-13 DIAGNOSIS — Z6841 Body Mass Index (BMI) 40.0 and over, adult: Secondary | ICD-10-CM | POA: Diagnosis not present

## 2021-11-13 DIAGNOSIS — E785 Hyperlipidemia, unspecified: Secondary | ICD-10-CM | POA: Diagnosis not present

## 2021-11-13 DIAGNOSIS — M62830 Muscle spasm of back: Secondary | ICD-10-CM | POA: Diagnosis not present

## 2021-11-13 DIAGNOSIS — Z8659 Personal history of other mental and behavioral disorders: Secondary | ICD-10-CM | POA: Diagnosis not present

## 2021-11-13 MED ORDER — CETIRIZINE HCL 10 MG PO TABS: 10.0000 mg | ORAL_TABLET | Freq: Every day | ORAL | 1 refills | Status: DC

## 2021-11-13 MED ORDER — BUSPIRONE HCL 7.5 MG PO TABS: 7.5000 mg | ORAL_TABLET | Freq: Two times a day (BID) | ORAL | 1 refills | Status: DC

## 2021-11-13 NOTE — Progress Notes (Signed)
Date:  11/13/2021   Name:  Margaret Cuevas   DOB:  April 27, 1976   MRN:  [id number]   Chief Complaint: Allergic Rhinitis  and Anxiety  Anxiety Presents for follow-up visit. Symptoms include confusion, excessive worry, nausea and nervous/anxious behavior. Patient reports no decreased concentration, feeling of choking or shortness of breath. Symptoms occur most days.    URI  This is a recurrent (for allergic rhinitis) problem. The problem has been waxing and waning. There has been no fever. Associated symptoms include abdominal pain, congestion, nausea, rhinorrhea and a sore throat. Pertinent negatives include no ear pain or sneezing. Associated symptoms comments: Throat itch. She has tried antihistamine for the symptoms. The treatment provided mild relief.    Lab Results  Component Value Date   NA 141 01/08/2021   K 5.1 01/08/2021   CO2 24 01/08/2021   GLUCOSE 98 01/08/2021   BUN 15 01/08/2021   CREATININE 0.74 01/08/2021   CALCIUM 9.5 01/08/2021   EGFR 103 01/08/2021   GFRNONAA >60 06/27/2020   Lab Results  Component Value Date   CHOL 237 (H) 01/08/2021   HDL 86 01/08/2021   LDLCALC 130 (H) 01/08/2021   TRIG 120 01/08/2021   Lab Results  Component Value Date   TSH 2.650 12/31/2020   Lab Results  Component Value Date   HGBA1C 5.8 (H) 01/08/2021   Lab Results  Component Value Date   WBC 4.7 10/13/2021   HGB 11.6 10/13/2021   HCT 35.9 10/13/2021   MCV 91 10/13/2021   PLT 237 10/13/2021   Lab Results  Component Value Date   ALT 15 06/27/2020   AST 19 06/27/2020   ALKPHOS 55 06/27/2020   BILITOT 0.5 06/27/2020   No results found for: "25OHVITD2", "25OHVITD3", "VD25OH"   Review of Systems  Constitutional:  Positive for unexpected weight change. Negative for fever.  HENT:  Positive for congestion, rhinorrhea and sore throat. Negative for ear pain and sneezing.   Respiratory:  Negative for shortness of breath.   Gastrointestinal:  Positive for abdominal pain and  nausea.  Psychiatric/Behavioral:  Positive for confusion. Negative for decreased concentration. The patient is nervous/anxious.     Patient Active Problem List   Diagnosis Date Noted   H/O total hysterectomy 07/15/2020   History of appendectomy 07/15/2020   Lipoma of back 11/15/2018   Fibromyalgia 10/25/2017   Influenza vaccine needed 01/20/2017    Allergies  Allergen Reactions   Ibuprofen Other (See Comments)    Other Reaction: Other reaction-swells    Past Surgical History:  Procedure Laterality Date   ABDOMINAL HYSTERECTOMY     ANKLE FRACTURE SURGERY     plates and screws placed on both sides   APPENDECTOMY     BACK SURGERY     CESAREAN SECTION     CHOLECYSTECTOMY     EAR TUBE REMOVAL     GASTRIC BYPASS  2006   ORIF FINGER / THUMB FRACTURE     screws in thumb    Social History   Tobacco Use   Smoking status: Some Days    Types: Cigarettes   Smokeless tobacco: Never  Substance Use Topics   Alcohol use: Yes   Drug use: No     Medication list has been reviewed and updated.  Current Meds  Medication Sig   busPIRone (BUSPAR) 7.5 MG tablet Take 1 tablet (7.5 mg total) by mouth 2 (two) times daily.   celecoxib (CELEBREX) 100 MG capsule Take 1 capsule by mouth 2 (  two) times daily. Rheum   cetirizine (ZYRTEC) 10 MG tablet Take 1 tablet (10 mg total) by mouth daily.   cyclobenzaprine (FLEXERIL) 10 MG tablet TAKE 1 TABLET(10 MG) BY MOUTH AT BEDTIME   DULoxetine (CYMBALTA) 30 MG capsule Take 1 capsule (30 mg total) by mouth daily. Change to 1 - 2 as needed for pain.   gabapentin (NEURONTIN) 100 MG capsule Take 100 mg by mouth 3 (three) times daily. Rheum   nortriptyline (PAMELOR) 10 MG capsule Increase Nortriptyline to 30 mg nightly for two weeks, then can increase to 40 mg nightly if needed.       11/13/2021    4:06 PM 10/13/2021    8:04 AM 05/15/2021    8:10 AM 01/08/2021    8:38 AM  GAD 7 : Generalized Anxiety Score  Nervous, Anxious, on Edge _0 Control/stop worrying _1 Worry too much - different things 1 0 2 1  Trouble relaxing 2 0 0 3  Restless 1 0 0 0  Easily annoyed or irritable 2 3 0 3  Afraid - awful might happen 3 0 0 0  Total GAD 7 Score _2 Anxiety Difficulty Very difficult Somewhat difficult Not difficult at all Somewhat difficult       11/13/2021    4:06 PM 10/13/2021    8:03 AM 05/15/2021    8:10 AM  Depression screen PHQ 2/9  Decreased Interest 2 2 0  Down, Depressed, Hopeless _3 PHQ - 2 Score _4 Altered sleeping 1 0 0  Tired, decreased energy 3 3 0  Change in appetite 1 0 0  Feeling bad or failure about yourself  1 0 0  Trouble concentrating 1 0 0  Moving slowly or fidgety/restless 1 0 0  Suicidal thoughts 0 0 0  PHQ-9 Score _5 Difficult doing work/chores Very difficult Somewhat difficult Not difficult at all    BP Readings from Last 3 Encounters:  11/13/21 122/70  10/13/21 120/80  05/15/21 126/86    Physical Exam HENT:     Right Ear: Tympanic membrane and ear canal normal.     Left Ear: Tympanic membrane and ear canal normal.     Mouth/Throat:     Mouth: Mucous membranes are moist.  Eyes:     Pupils: Pupils are equal, round, and reactive to light.  Cardiovascular:     Rate and Rhythm: Normal rate and regular rhythm.     Heart sounds: Normal heart sounds. No murmur heard.    No friction rub. No gallop.  Pulmonary:     Breath sounds: No wheezing, rhonchi or rales.     Comments: Left 7th costochondral  Chest:     Chest wall: Tenderness present.  Neurological:     Mental Status: She is alert.     Wt Readings from Last 3 Encounters:  11/13/21 255 lb (115.7 kg)  10/13/21 254 lb (115.2 kg)  05/15/21 249 lb (112.9 kg)    BP 122/70   Pulse 68   Ht _6  (1.676 m)   Wt 255 lb (115.7 kg)   BMI 41.16 kg/m   Assessment and Plan:  1. Anxiety Chronic.  Controlled.  Relatively stable.  PHQ 12.  GAD score 12.  Continue buspirone 7.5 mg twice a day. - busPIRone  (BUSPAR) 7.5 MG tablet; Take 1 tablet (7.5 mg total) by mouth 2 (two) times daily.  Dispense: 180  tablet; Refill: 1  2. Seasonal allergic rhinitis, unspecified trigger Chronic.  Episodic.  Waxes and wanes in intensity.  Continue Zyrtec 10 mg once a day. - cetirizine (ZYRTEC) 10 MG tablet; Take 1 tablet (10 mg total) by mouth daily.  Dispense: 90 tablet; Refill: 1  3. Muscle spasm of back Bilateral neck spasm in the back followed by rheumatology.  4. Vasomotor symptoms due to menopause Vasomotor symptoms patient has been off exogenous estrogen.  Patient is followed by GYN.  5. Hyperlipidemia, unspecified hyperlipidemia type Chronic.  Controlled.  Stable.  Relative elevation of triglycerides and LDL in the past but uncertain as to whether patient was fasting.  We will obtain lipid panel and renal panel for evaluation but in fasting state patient will return for lab work tomorrow with lab. - Renal Function Panel - Lipid Panel With LDL/HDL Ratio  6. Neck pain Patient having neck pain that is persistent and is concerned about her carotids and we will obtain an ultrasound of the carotids.  Patient does have a smoking history which puts her at risk for carotid vascular disease. - US Carotid Duplex Bilateral

## 2021-11-14 DIAGNOSIS — E785 Hyperlipidemia, unspecified: Secondary | ICD-10-CM | POA: Diagnosis not present

## 2021-11-15 LAB — RENAL FUNCTION PANEL
Albumin: 4.1 g/dL (ref 3.9–4.9)
BUN/Creatinine Ratio: 27 — ABNORMAL HIGH (ref 9–23)
BUN: 20 mg/dL (ref 6–24)
CO2: 24 mmol/L (ref 20–29)
Calcium: 9.3 mg/dL (ref 8.7–10.2)
Chloride: 104 mmol/L (ref 96–106)
Creatinine, Ser: 0.75 mg/dL (ref 0.57–1.00)
Glucose: 96 mg/dL (ref 70–99)
Phosphorus: 4.6 mg/dL — ABNORMAL HIGH (ref 3.0–4.3)
Potassium: 4.9 mmol/L (ref 3.5–5.2)
Sodium: 142 mmol/L (ref 134–144)
eGFR: 101 mL/min/{1.73_m2} (ref >59)

## 2021-11-15 LAB — LIPID PANEL WITH LDL/HDL RATIO
Cholesterol, Total: 191 mg/dL (ref 100–199)
HDL: 79 mg/dL (ref >39)
LDL Chol Calc (NIH): 99 mg/dL (ref 0–99)
LDL/HDL Ratio: 1.3 ratio (ref 0.0–3.2)
Triglycerides: 71 mg/dL (ref 0–149)
VLDL Cholesterol Cal: 13 mg/dL (ref 5–40)

## 2021-11-19 ENCOUNTER — Ambulatory Visit
Admission: RE | Admit: 2021-11-19 | Discharge: 2021-11-19 | Disposition: A | Discharge status: Home / Self Care | Payer: BC Managed Care – PPO | Source: Ambulatory Visit | Attending: Family Medicine | Admitting: Family Medicine

## 2021-11-19 DIAGNOSIS — M542 Cervicalgia: Secondary | ICD-10-CM | POA: Insufficient documentation

## 2021-11-19 DIAGNOSIS — R42 Dizziness and giddiness: Secondary | ICD-10-CM | POA: Diagnosis not present

## 2021-12-09 ENCOUNTER — Other Ambulatory Visit: Payer: Self-pay | Admitting: Family Medicine

## 2021-12-09 ENCOUNTER — Encounter: Payer: Self-pay | Admitting: Family Medicine

## 2021-12-09 DIAGNOSIS — N63 Unspecified lump in unspecified breast: Secondary | ICD-10-CM

## 2021-12-10 DIAGNOSIS — M79671 Pain in right foot: Secondary | ICD-10-CM | POA: Diagnosis not present

## 2021-12-10 DIAGNOSIS — M79672 Pain in left foot: Secondary | ICD-10-CM | POA: Diagnosis not present

## 2021-12-10 DIAGNOSIS — M797 Fibromyalgia: Secondary | ICD-10-CM | POA: Diagnosis not present

## 2021-12-11 ENCOUNTER — Other Ambulatory Visit: Payer: Self-pay

## 2021-12-11 DIAGNOSIS — F32A Depression, unspecified: Secondary | ICD-10-CM

## 2021-12-11 MED ORDER — DULOXETINE HCL 30 MG PO CPEP: 30.0000 mg | ORAL_CAPSULE | Freq: Every day | ORAL | 0 refills | Status: DC

## 2022-01-02 ENCOUNTER — Ambulatory Visit: Payer: Self-pay

## 2022-01-02 NOTE — Patient Instructions (Signed)
Visit Information  Thank you for taking time to visit with me today. Please don't hesitate to contact me if I can be of assistance to you.   Following are the goals we discussed today:   Goals Addressed             This Visit's Progress    RNCM: Effective Management of Fibromyalgia       Care Coordination Interventions:  The patient rates her fibromyalgia pain at a 3 today on a scale of 0-10 Reviewed provider established plan for pain management. The patient feels she is doing better with her pain and discomfort since following up with Rheumatologist. She has refills for her medications and the medications are effective in helping her mange her sx and sx.   Discussed importance of adherence to all scheduled medical appointments. Will follow up with Rheumatologist in 6 months, pcp in January 2024 Counseled on the importance of reporting any/all new or changed pain symptoms or management strategies to pain management provider Advised patient to report to care team affect of pain on daily activities Discussed use of relaxation techniques and/or diversional activities to assist with pain reduction (distraction, imagery, relaxation, massage, acupressure, TENS, heat, and cold application Reviewed with patient prescribed pharmacological and nonpharmacological pain relief strategies Advised patient to discuss unresolved pain, changes in level or intensity of pain  with provider Screening for signs and symptoms of depression related to chronic disease state  Assessed social determinant of health barriers 01-02-2022: The patient agrees to outreaches with the [facility]. Review of the goals of the care coordination program and provided information on contact information for the [facility]           Our next appointment is by telephone on 04-02-2022 at 11 am  Please call the care guide team at [phone] if you need to cancel or reschedule your appointment.   If you are experiencing a Mental Health or  [place] or need someone to talk to, please call the Suicide and Crisis Lifeline: 988 call the [place] National Suicide Prevention Lifeline: 807-139-0298 or TTY: 531-070-4160 TTY 281-754-1312) to talk to a trained counselor call 1-800-273-TALK (toll free, 24 hour hotline)  Patient verbalizes understanding of instructions and care plan provided today and agrees to view in [place]. Active MyChart status and patient understanding of how to access instructions and care plan via MyChart confirmed with patient.     Telephone follow up appointment with care management team member scheduled for: 04-02-2022 at 23 am  Theresa, MSN, [facility] Mobile: [phone]

## 2022-01-02 NOTE — Patient Outreach (Signed)
  Care Coordination   Initial Visit Note   01/02/2022 Name: Margaret Cuevas MRN: [id number] DOB: 06-28-1975  Margaret Cuevas is a 46 y.o. year old female who sees Juline Patch, MD for primary care. I spoke with  Margaret Cuevas by phone today.  What matters to the patients health and wellness today?  Management of her pain     Goals Addressed             This Visit's Progress    RNCM: Effective Management of Fibromyalgia       Care Coordination Interventions:  The patient rates her fibromyalgia pain at a 3 today on a scale of 0-10 Reviewed provider established plan for pain management. The patient feels she is doing better with her pain and discomfort since following up with Rheumatologist. She has refills for her medications and the medications are effective in helping her mange her sx and sx.   Discussed importance of adherence to all scheduled medical appointments. Will follow up with Rheumatologist in 6 months, pcp in January 2024 Counseled on the importance of reporting any/all new or changed pain symptoms or management strategies to pain management provider Advised patient to report to care team affect of pain on daily activities Discussed use of relaxation techniques and/or diversional activities to assist with pain reduction (distraction, imagery, relaxation, massage, acupressure, TENS, heat, and cold application Reviewed with patient prescribed pharmacological and nonpharmacological pain relief strategies Advised patient to discuss unresolved pain, changes in level or intensity of pain  with provider Screening for signs and symptoms of depression related to chronic disease state  Assessed social determinant of health barriers 01-02-2022: The patient agrees to outreaches with the [facility]. Review of the goals of the care coordination program and provided information on contact information for the [facility]           SDOH assessments and interventions completed:  Yes  SDOH Interventions  Today    Flowsheet Row Most Recent Value  SDOH Interventions   Food Insecurity Interventions Intervention Not Indicated  Housing Interventions Intervention Not Indicated  Transportation Interventions Intervention Not Indicated  Utilities Interventions Intervention Not Indicated        Care Coordination Interventions Activated:  Yes  Care Coordination Interventions:  Yes, provided   Follow up plan: Follow up call scheduled for 04-02-2022 at 11 am    Encounter Outcome:  Pt. Visit Completed

## 2022-01-07 ENCOUNTER — Ambulatory Visit
Admission: RE | Admit: 2022-01-07 | Discharge: 2022-01-07 | Disposition: A | Discharge status: Home / Self Care | Payer: BC Managed Care – PPO | Source: Ambulatory Visit | Attending: Family Medicine | Admitting: Family Medicine

## 2022-01-07 DIAGNOSIS — N6489 Other specified disorders of breast: Secondary | ICD-10-CM | POA: Diagnosis not present

## 2022-01-07 DIAGNOSIS — N63 Unspecified lump in unspecified breast: Secondary | ICD-10-CM | POA: Insufficient documentation

## 2022-01-07 DIAGNOSIS — R928 Other abnormal and inconclusive findings on diagnostic imaging of breast: Secondary | ICD-10-CM | POA: Diagnosis not present

## 2022-03-03 DIAGNOSIS — Z6841 Body Mass Index (BMI) 40.0 and over, adult: Secondary | ICD-10-CM | POA: Diagnosis not present

## 2022-03-03 DIAGNOSIS — Z1211 Encounter for screening for malignant neoplasm of colon: Secondary | ICD-10-CM | POA: Diagnosis not present

## 2022-03-03 DIAGNOSIS — K921 Melena: Secondary | ICD-10-CM | POA: Diagnosis not present

## 2022-03-15 DIAGNOSIS — G4733 Obstructive sleep apnea (adult) (pediatric): Secondary | ICD-10-CM | POA: Diagnosis not present

## 2022-03-16 DIAGNOSIS — Z1211 Encounter for screening for malignant neoplasm of colon: Secondary | ICD-10-CM | POA: Diagnosis not present

## 2022-03-25 ENCOUNTER — Emergency Department: Payer: BC Managed Care – PPO

## 2022-03-25 ENCOUNTER — Other Ambulatory Visit: Payer: Self-pay

## 2022-03-25 ENCOUNTER — Emergency Department
Admission: EM | Admit: 2022-03-25 | Discharge: 2022-03-25 | Disposition: A | Discharge status: Home / Self Care | Payer: BC Managed Care – PPO | Attending: Emergency Medicine | Admitting: Emergency Medicine

## 2022-03-25 ENCOUNTER — Encounter: Payer: Self-pay | Admitting: Family Medicine

## 2022-03-25 DIAGNOSIS — M545 Low back pain, unspecified: Secondary | ICD-10-CM | POA: Diagnosis not present

## 2022-03-25 DIAGNOSIS — M5459 Other low back pain: Secondary | ICD-10-CM | POA: Diagnosis not present

## 2022-03-25 DIAGNOSIS — M797 Fibromyalgia: Secondary | ICD-10-CM | POA: Diagnosis not present

## 2022-03-25 DIAGNOSIS — M25552 Pain in left hip: Secondary | ICD-10-CM | POA: Diagnosis not present

## 2022-03-25 DIAGNOSIS — M25551 Pain in right hip: Secondary | ICD-10-CM | POA: Diagnosis not present

## 2022-03-25 LAB — CBC
HCT: 38.3 % (ref 36.0–46.0)
Hemoglobin: 12.5 g/dL (ref 12.0–15.0)
MCH: 28.5 pg (ref 26.0–34.0)
MCHC: 32.6 g/dL (ref 30.0–36.0)
MCV: 87.2 fL (ref 80.0–100.0)
Platelets: 239 10*3/uL (ref 150–400)
RBC: 4.39 MIL/uL (ref 3.87–5.11)
RDW: 15 % (ref 11.5–15.5)
WBC: 5.6 10*3/uL (ref 4.0–10.5)
nRBC: 0 % (ref 0.0–0.2)

## 2022-03-25 LAB — BASIC METABOLIC PANEL
Anion gap: 9 (ref 5–15)
BUN: 21 mg/dL — ABNORMAL HIGH (ref 6–20)
CO2: 28 mmol/L (ref 22–32)
Calcium: 8.7 mg/dL — ABNORMAL LOW (ref 8.9–10.3)
Chloride: 104 mmol/L (ref 98–111)
Creatinine, Ser: 0.72 mg/dL (ref 0.44–1.00)
GFR, Estimated: >60 mL/min (ref >60)
Glucose, Bld: 99 mg/dL (ref 70–99)
Potassium: 4 mmol/L (ref 3.5–5.1)
Sodium: 141 mmol/L (ref 135–145)

## 2022-03-25 MED ORDER — KETOROLAC TROMETHAMINE 15 MG/ML IJ SOLN
15.0000 mg | Freq: Once | INTRAMUSCULAR | Status: AC
  Administered 2022-03-25: 15 mg via INTRAMUSCULAR
  Filled 2022-03-25: qty 1

## 2022-03-25 MED ORDER — OXYCODONE-ACETAMINOPHEN 5-325 MG PO TABS: 1.0000 | ORAL_TABLET | Freq: Four times a day (QID) | ORAL | 0 refills | Status: AC | PRN

## 2022-03-25 MED ORDER — ACETAMINOPHEN 500 MG PO TABS
1000.0000 mg | ORAL_TABLET | Freq: Once | ORAL | Status: AC
  Administered 2022-03-25: 1000 mg via ORAL
  Filled 2022-03-25: qty 2

## 2022-03-25 NOTE — Discharge Instructions (Addendum)
-  Please continue taking your prescribed medications as needed for your pain.  Please utilize oxycodone sparingly as it can make you dizzy/drowsy and can also be addicting.  -In regards to discontinuation, you may follow-up with the neurosurgeon, Dr. Cari Caraway, as needed.  -Please follow-up with your regular doctor for ongoing management of your fibromyalgia.  -Return to the emergency department anytime if you begin to experience any new or worsening symptoms.

## 2022-03-25 NOTE — ED Provider Notes (Signed)
[facility] Provider Note    Event Date/Time   First MD Initiated Contact with Patient 03/25/22 1309     (approximate)   History   Chief Complaint Back Pain   HPI Margaret Cuevas is a 46 y.o. female, history of anxiety, fibromyalgia, presents to the emergency department for evaluation of back pain.  Patient states that she feels like she is having a fibromyalgia flare.  She reports pain in the left elbow, both hips, and mid lower back.  She states it feels somewhat similar to her fibromyalgia, but does state that it feels more severe than usual.  She states that she feels a strong, point tenderness along the midline lumbar spine.  She denies any recent falls or injuries.  She tried to make an appoint with her regular doctor, however they were not available.  She went to urgent care, and they referred her here to the emergency department.  She is currently already taking gabapentin, cyclobenzaprine, and Tylenol.  Denies fever/chills, chest pain, shortness of breath, abdominal pain, saddle anesthesia, bowel/bladder dysfunction, cold sensation in the affected extremities, numb/tingling, or dizziness/lightheadedness.  History Limitations: No limitations.        Physical Exam  Triage Vital Signs: ED Triage Vitals  Enc Vitals Group     BP 03/25/22 1125 (!) 121/92     Pulse Rate 03/25/22 1125 90     Resp 03/25/22 1125 18     Temp 03/25/22 1125 98.5 F (36.9 C)     Temp src --      SpO2 03/25/22 1125 97 %     Weight --      Height --      Head Circumference --      Peak Flow --      Pain Score 03/25/22 1124 10     Pain Loc --      Pain Edu? --      Excl. in [place]? --     Most recent vital signs: Vitals:   03/25/22 1125 03/25/22 1602  BP: (!) 121/92 114/70  Pulse: 90 77  Resp: 18 17  Temp: 98.5 F (36.9 C) 98.4 F (36.9 C)  SpO2: 97% 99%    General: Awake, appears uncomfortable. Skin: Warm, dry. No rashes or lesions.  Eyes: PERRL. Conjunctivae  normal.  CV: Good peripheral perfusion.  Resp: Normal effort.  Abd: Soft, non-tender. No distention.  Neuro: At baseline. No gross neurological deficits.  Musculoskeletal: Normal ROM of all extremities.  Focused Exam: Point tenderness along the L1/L2 region.  Normal range of motion of all extremities.  No osseous tenderness elsewhere.  PMS intact distally in all extremities.  Physical Exam    ED Results / Procedures / Treatments  Labs (all labs ordered are listed, but only abnormal results are displayed) Labs Reviewed  BASIC METABOLIC PANEL - Abnormal; Notable for the following components:      Result Value   BUN 21 (*)    Calcium 8.7 (*)    All other components within normal limits  CBC     EKG N/A.    RADIOLOGY  ED Provider Interpretation: I personally reviewed and interpreted these images.  CT lumbar spine does show broad-based disc bulge at L5/S1.  No acute fractures.  Hip x-rays are negative.  CT Lumbar Spine Wo Contrast  Result Date: 03/25/2022 CLINICAL DATA:  Back pain. EXAM: CT LUMBAR SPINE WITHOUT CONTRAST TECHNIQUE: Multidetector CT imaging of the lumbar spine was performed without intravenous contrast administration.  Multiplanar CT image reconstructions were also generated. RADIATION DOSE REDUCTION: This exam was performed according to the departmental dose-optimization program which includes automated exposure control, adjustment of the mA and/or kV according to patient size and/or use of iterative reconstruction technique. COMPARISON:  None Available. FINDINGS: Segmentation: 5 lumbar type vertebrae. Alignment: Normal. Vertebrae: No acute fracture or aggressive osseous lesion. Paraspinal and other soft tissues: Negative. Other: Mild osteoarthritis of bilateral SI joints. Disc levels: Disc spaces: Disc spaces are maintained. T12-L1: No disc protrusion, foraminal stenosis or central canal stenosis. L1-L2: No disc protrusion, foraminal stenosis or central canal  stenosis. L2-L3: No disc protrusion, foraminal stenosis or central canal stenosis. L3-L4: No disc protrusion, foraminal stenosis or central canal stenosis. L4-L5: Broad right foraminal/lateral disc protrusion contacting the right exiting L4 nerve root. No left foraminal stenosis. Mild right foraminal stenosis. No spinal stenosis. L5-S1: Broad-based disc bulge. Moderate bilateral foraminal stenosis. Mild bilateral facet arthropathy. IMPRESSION: 1. No acute osseous injury of the lumbar spine. 2. At L4-5 there is a broad right foraminal/lateral disc protrusion contacting the right exiting L4 nerve root. Mild right foraminal stenosis. 3. At L5-S1 there is a broad-based disc bulge. Moderate bilateral foraminal stenosis. Electronically Signed   By: Kathreen Devoid M.D.   On: 03/25/2022 13:54   DG Hip Unilat With Pelvis 2-3 Views Left  Result Date: 03/25/2022 CLINICAL DATA:  Bilateral hip pain, stabbing sensation.  No injury. EXAM: DG HIP (WITH OR WITHOUT PELVIS) 2-3V LEFT COMPARISON:  None Available. FINDINGS: Hip joints are symmetric.  No degenerative findings. IMPRESSION: Negative. Electronically Signed   By: Lorin Picket M.D.   On: 03/25/2022 13:54    PROCEDURES:  Critical Care performed: N/A.  Procedures    MEDICATIONS ORDERED IN ED: Medications  acetaminophen (TYLENOL) tablet 1,000 mg (1,000 mg Oral Given 03/25/22 1332)  ketorolac (TORADOL) 15 MG/ML injection 15 mg (15 mg Intramuscular Given 03/25/22 1333)     IMPRESSION / MDM / ASSESSMENT AND PLAN / ED COURSE  I reviewed the triage vital signs and the nursing notes.                              Differential diagnosis includes, but is not limited to, fibromyalgia, sciatica, disc herniation, lumbar fracture, lumbosacral strain, hip fractures, piriformis syndrome, chronic pain syndrome.  ED Course Patient appears well, vitals within normal limits.  NAD.  Initial treatment with acetaminophen and ketorolac.  CBC shows no leukocytosis or  anemia.  BMP shows no electrolyte abnormalities or AKI.  Assessment/Plan Patient presents for suspected fibromyalgia flare.  She is currently nursing pain in her left elbow, lumbar spine, and hips bilaterally.  Physical exam is overall unremarkable, though she did have some point tenderness along both hips bilaterally, as well as the midline lumbar spine around L1/L2.  Her imaging was ultimately negative, however her CT lumbar spine did show a broad-based disc bulge and L5/S1, as well as foraminal stenosis and central canal stenosis and L4/L5.  She is not having any bowel bladder dysfunction or saddle anesthesia to suggest cauda equina syndrome.  Overall she appears well clinically.  Her pain was somewhat relieved with acetaminophen ketorolac, however she continues to be in moderate pain.  Will provide her with a brief prescription for oxycodone/acetaminophen to help manage this acute flare.  I recommended she follow-up with her regular doctor and continue her other prescribed medications as needed.  She was amenable to this plan.  Will discharge.  Considered admission for this patient, but given her stable presentation and unremarkable workup, she will likely benefit from admission.  Provided the patient with anticipatory guidance, return precautions, and educational material. Encouraged the patient to return to the emergency department at any time if they begin to experience any new or worsening symptoms. Patient expressed understanding and agreed with the plan.   Patient's presentation is most consistent with acute complicated illness / injury requiring diagnostic workup.       FINAL CLINICAL IMPRESSION(S) / ED DIAGNOSES   Final diagnoses:  Midline low back pain without sciatica, unspecified chronicity  Fibromyalgia     Rx / DC Orders   ED Discharge Orders          Ordered    oxyCODONE-acetaminophen (PERCOCET) 5-325 MG tablet  Every 6 hours PRN        03/25/22 1615              Note:  This document was prepared using Dragon voice recognition software and may include unintentional dictation errors.   Teodoro Spray, [place] 03/25/22 1622    Duffy Bruce, MD 03/29/22 [phone]

## 2022-03-25 NOTE — ED Triage Notes (Signed)
Pt comes with c/o left elbow, both hips and upper mid back. Pt states she is just hurting so bad and in so much pain. Pt denies any known injuries. Pt states she does have fibromyalgia. Pt states they couldn't see her at doctors and UC brought her over here.

## 2022-03-26 ENCOUNTER — Encounter: Payer: Self-pay | Admitting: Family Medicine

## 2022-03-26 ENCOUNTER — Telehealth: Payer: Self-pay

## 2022-03-26 NOTE — Telephone Encounter (Signed)
Transition Care Management Follow-up Telephone Call Date of discharge and from where: 03/25/2022/ [facility] How have you been since you were released from the hospital? better Any questions or concerns? Yes  Items Reviewed: Did the pt receive and understand the discharge instructions provided? Yes  Medications obtained and verified? Yes  Other?  Discussed getting in with Dr Cari Caraway Any new allergies since your discharge? No  Dietary orders reviewed? Yes Do you have support at home? Yes   Home Care and Equipment/Supplies: none  Functional Questionnaire: (I = Independent and D = Dependent) ADLs: I  Bathing/Dressing- I  Meal Prep- I  Eating- I  Maintaining continence- I  Transferring/Ambulation- I  Managing Meds- I  Follow up appointments reviewed:  PCP Hospital f/u appt confirmed?  Was told to get in with neurosurgeon ASAP by ER- trying to call and get appt. Today.   [facility] f/u appt confirmed?  Is going to call after our phone call.   Are transportation arrangements needed? No  If their condition worsens, is the pt aware to call PCP or go to the Emergency Dept.? Yes Was the patient provided with contact information for the PCP's office or ED? Yes Was to pt encouraged to call back with questions or concerns? Yes

## 2022-03-30 ENCOUNTER — Ambulatory Visit: Payer: Self-pay | Admitting: *Deleted

## 2022-03-30 NOTE — Patient Outreach (Signed)
  Care Coordination   Follow Up Visit Note   03/31/2022 Name: Margaret Cuevas MRN: [id number] DOB: Jan 31, 1976  Margaret Cuevas is a 46 y.o. year old female who sees Juline Patch, MD for primary care. I spoke with  Wonda Horner by phone today.  What matters to the patients health and wellness today?  Seen in ED o 11/29 for chronic pain.      Goals Addressed             This Visit's Progress    RNCM: Effective Management of Fibromyalgia   On track    Care Coordination Interventions:  The patient rates her fibromyalgia pain at a 10 today on a scale of 0-10, state she is "managing" it Reviewed provider established plan for pain management. The patient feels she is doing better with her pain and discomfort since following up with Rheumatologist. She has refills for her medications and the medications are effective in helping her mange her sx and sx.   Discussed importance of adherence to all scheduled medical appointments. Will follow up with Rheumatologist on 12/6, pcp in January 2024 Counseled on the importance of reporting any/all new or changed pain symptoms or management strategies to pain management provider Advised patient to report to care team affect of pain on daily activities Discussed use of relaxation techniques and/or diversional activities to assist with pain reduction (distraction, imagery, relaxation, massage, acupressure, TENS, heat, and cold application Reviewed with patient prescribed pharmacological and nonpharmacological pain relief strategies Advised patient to discuss unresolved pain, changes in level or intensity of pain  with provider The patient agrees to outreaches with the RNCM. Review of the goals of the care coordination program and provided information on contact information for the [facility] Having trouble with getting FMLA paperwork completed by PCP office.  Collaborated with the office, notified that they only complete paperwork if they are the ones taking the  patient out of work.  She will ask specialist on Wednesday Considering new PCP, emailed options in her area to review Per ED visit on 11/29, she is in need of appointment with neurosurgeon, scheduled for 1/2           SDOH assessments and interventions completed:  No     Care Coordination Interventions:  Yes, provided   Follow up plan: Follow up call scheduled for 12/29    Encounter Outcome:  Pt. Visit Completed   Valente David, RN, MSN, [place] Care Management Care Management Coordinator [phone]

## 2022-03-31 NOTE — Patient Instructions (Signed)
Visit Information  Thank you for taking time to visit with me today. Please don't hesitate to contact me if I can be of assistance to you before our next scheduled telephone appointment.  Following are the goals we discussed today:  Review email sent with provider options and explanation of FMLA paperwork.   Our next appointment is by telephone on 12/29  Please call the care guide team at [phone] if you need to cancel or reschedule your appointment.   Please call the Suicide and Crisis Lifeline: 988 call the [place] National Suicide Prevention Lifeline: 774-767-7424 or TTY: 905-420-5997 TTY 2058092306) to talk to a trained counselor call 1-800-273-TALK (toll free, 24 hour hotline) call 911 if you are experiencing a Mental Health or [place] or need someone to talk to.  Patient verbalizes understanding of instructions and care plan provided today and agrees to view in [place]. Active MyChart status and patient understanding of how to access instructions and care plan via MyChart confirmed with patient.     The patient has been provided with contact information for the care management team and has been advised to call with any health related questions or concerns.   Valente David, RN, MSN, [place] Care Management Care Management Coordinator [phone]

## 2022-04-01 DIAGNOSIS — M797 Fibromyalgia: Secondary | ICD-10-CM | POA: Diagnosis not present

## 2022-04-01 DIAGNOSIS — M48061 Spinal stenosis, lumbar region without neurogenic claudication: Secondary | ICD-10-CM | POA: Diagnosis not present

## 2022-04-01 DIAGNOSIS — Z791 Long term (current) use of non-steroidal anti-inflammatories (NSAID): Secondary | ICD-10-CM | POA: Diagnosis not present

## 2022-04-02 ENCOUNTER — Encounter: Payer: BC Managed Care – PPO | Admitting: *Deleted

## 2022-04-08 ENCOUNTER — Other Ambulatory Visit: Payer: Self-pay | Admitting: Nurse Practitioner

## 2022-04-08 DIAGNOSIS — Z9884 Bariatric surgery status: Secondary | ICD-10-CM

## 2022-04-08 DIAGNOSIS — K649 Unspecified hemorrhoids: Secondary | ICD-10-CM | POA: Diagnosis not present

## 2022-04-08 DIAGNOSIS — Z83719 Family history of colon polyps, unspecified: Secondary | ICD-10-CM | POA: Diagnosis not present

## 2022-04-08 DIAGNOSIS — R1310 Dysphagia, unspecified: Secondary | ICD-10-CM

## 2022-04-08 DIAGNOSIS — K625 Hemorrhage of anus and rectum: Secondary | ICD-10-CM | POA: Diagnosis not present

## 2022-04-08 LAB — HM COLONOSCOPY

## 2022-04-14 DIAGNOSIS — G4733 Obstructive sleep apnea (adult) (pediatric): Secondary | ICD-10-CM | POA: Diagnosis not present

## 2022-04-17 ENCOUNTER — Ambulatory Visit
Admission: RE | Admit: 2022-04-17 | Discharge: 2022-04-17 | Disposition: A | Discharge status: Home / Self Care | Payer: BC Managed Care – PPO | Source: Ambulatory Visit | Attending: Nurse Practitioner | Admitting: Nurse Practitioner

## 2022-04-17 DIAGNOSIS — R1310 Dysphagia, unspecified: Secondary | ICD-10-CM | POA: Insufficient documentation

## 2022-04-17 DIAGNOSIS — K449 Diaphragmatic hernia without obstruction or gangrene: Secondary | ICD-10-CM | POA: Diagnosis not present

## 2022-04-17 DIAGNOSIS — Z9884 Bariatric surgery status: Secondary | ICD-10-CM | POA: Diagnosis not present

## 2022-04-17 DIAGNOSIS — K219 Gastro-esophageal reflux disease without esophagitis: Secondary | ICD-10-CM | POA: Diagnosis not present

## 2022-04-17 DIAGNOSIS — K649 Unspecified hemorrhoids: Secondary | ICD-10-CM | POA: Insufficient documentation

## 2022-04-24 ENCOUNTER — Ambulatory Visit: Payer: Self-pay | Admitting: *Deleted

## 2022-04-24 NOTE — Patient Outreach (Signed)
  Care Coordination   Follow Up Visit Note   04/24/2022 Name: Margaret Cuevas MRN: [id number] DOB: 04-09-1976  Margaret Cuevas is a 46 y.o. year old female who sees Juline Patch, MD for primary care. I spoke with  Wonda Horner by phone today.  What matters to the patients health and wellness today?  Still managing fibromyalgia    Goals Addressed             This Visit's Progress    RNCM: Effective Management of Fibromyalgia   On track    Care Coordination Interventions:  Reviewed provider established plan for pain management. The patient feels she is doing better with her pain and discomfort since following up with Rheumatologist. She has refills for her medications and the medications are effective in helping her mange her sx and sx.   Discussed importance of adherence to all scheduled medical appointments. Will follow up with Rheumatologist on 12/6, pcp in January 2024 Counseled on the importance of reporting any/all new or changed pain symptoms or management strategies to pain management provider Advised patient to report to care team affect of pain on daily activities Discussed use of relaxation techniques and/or diversional activities to assist with pain reduction (distraction, imagery, relaxation, massage, acupressure, TENS, heat, and cold application Reviewed with patient prescribed pharmacological and nonpharmacological pain relief strategies Advised patient to discuss unresolved pain, changes in level or intensity of pain  with provider The patient agrees to outreaches with the RNCM. Review of the goals of the care coordination program and provided information on contact information for the [facility] Having trouble with getting FMLA paperwork completed by PCP office.  Collaborated with the office, notified that they only complete paperwork if they are the ones taking the patient out of work.  She will ask specialist on Wednesday Considering new PCP, emailed options in her area to  review - She will review once she has new insurance information as she will have new plan starting 1/1 Per ED visit on 11/29, she is in need of appointment with neurosurgeon, scheduled for 1/2           SDOH assessments and interventions completed:  No     Care Coordination Interventions:  Yes, provided   Follow up plan:  Patient will call once she reviews new insurance information    Encounter Outcome:  Pt. Visit Completed   Valente David, RN, MSN, [facility] Care Management Care Management Coordinator [phone]

## 2022-04-24 NOTE — Patient Instructions (Signed)
Visit Information  Thank you for taking time to visit with me today. Please don't hesitate to contact me if I can be of assistance to you.  Following are the goals we discussed today:  Review new PCP options according to new insurance and call this RNCM with questions.   Please call the Suicide and Crisis Lifeline: 988 call the [place] National Suicide Prevention Lifeline: (639) 795-8210 or TTY: 253-365-3412 TTY 681-556-0816) to talk to a trained counselor call 1-800-273-TALK (toll free, 24 hour hotline) call 911 if you are experiencing a Mental Health or [place] or need someone to talk to.  Patient verbalizes understanding of instructions and care plan provided today and agrees to view in [place]. Active MyChart status and patient understanding of how to access instructions and care plan via MyChart confirmed with patient.     The patient has been provided with contact information for the care management team and has been advised to call with any health related questions or concerns.   Valente David, RN, MSN, [place] Care Management Care Management Coordinator [phone]

## 2022-04-28 ENCOUNTER — Ambulatory Visit (INDEPENDENT_AMBULATORY_CARE_PROVIDER_SITE_OTHER): Payer: No Typology Code available for payment source | Admitting: Neurosurgery

## 2022-04-28 ENCOUNTER — Encounter: Payer: Self-pay | Admitting: Neurosurgery

## 2022-04-28 VITALS — BP 114/64 | HR 80 | Ht 66.0 in | Wt 251.0 lb

## 2022-04-28 DIAGNOSIS — M47817 Spondylosis without myelopathy or radiculopathy, lumbosacral region: Secondary | ICD-10-CM

## 2022-04-28 MED ORDER — GABAPENTIN 100 MG PO CAPS: 100.0000 mg | ORAL_CAPSULE | Freq: Every evening | ORAL | 0 refills | Status: DC

## 2022-04-28 NOTE — Progress Notes (Signed)
Referring Physician:  Duffy Bruce, MD Margaret Cuevas,  Margaret Cuevas  Primary Physician:  Margaret Patch, MD  History of Present Illness: 04/28/2022 Margaret Cuevas is a 47 y.o with a history of anxiety, fibromyalgia who is here today with a chief complaint of back pain. She was seen in the ER on 03/25/22 for this. At the time she complained of back pain more severe than usual with strong point tenderness along the midline of her lumbar spine without an recent inciting event. At the time she was taking gabapentin, Flexeril, and Tylenol. At lumbar CT scan was done which was without acute fracture but did show signs concerning for broad disc protrusions at L4-5 and L5-S1. Today she reports a 3 month history of increased low back pain although she endorses about 1 year of vaginal and groin pain.  She describes pain that starts in the middle of her back and radiates to her tailbone and into her bilateral hips worse on the right and some pain into her posterior thighs. She states that she will occasionally have severe sharp pains in her third toe on her right foot.  She will also occasionally have radiating pain down her right leg and this occurs typically when she feels as though her back is "locking up".  Her symptoms seem to do better with standing and leaning forward and worse with sitting.  She denies any weakness in her lower extremities or bowel or bladder incontinence. She underwent PT at [facility] for vaginal pain without any significant improvement. She states she was told symptoms are likely coming from her back and was given back exercises however she did not undergo any formal physical therapy for her low back. Note, she reports smoking tobacco socially.  Past Surgery: Lumbar discectomy in her teens  Margaret Cuevas has no symptoms of cervical myelopathy.  The symptoms are causing a significant impact on the patient's life.   Review of Systems:  A 10 point review of  systems is negative, except for the pertinent positives and negatives detailed in the HPI.  Past Medical History: Past Medical History:  Diagnosis Date   Anxiety    Fibromyalgia     Past Surgical History: Past Surgical History:  Procedure Laterality Date   ABDOMINAL HYSTERECTOMY     ANKLE FRACTURE SURGERY     plates and screws placed on both sides   APPENDECTOMY     BACK SURGERY     CESAREAN SECTION     CHOLECYSTECTOMY     EAR TUBE REMOVAL     GASTRIC BYPASS  2006   ORIF FINGER / THUMB FRACTURE     screws in thumb    Allergies: Allergies as of 04/28/2022 - Review Complete 03/25/2022  Allergen Reaction Noted   Ibuprofen Other (See Comments)     Medications: Outpatient Encounter Medications as of 04/28/2022  Medication Sig   busPIRone (BUSPAR) 7.5 MG tablet Take 1 tablet (7.5 mg total) by mouth 2 (two) times daily.   celecoxib (CELEBREX) 100 MG capsule Take 1 capsule by mouth 2 (two) times daily. Rheum   cetirizine (ZYRTEC) 10 MG tablet Take 1 tablet (10 mg total) by mouth daily.   cyclobenzaprine (FLEXERIL) 10 MG tablet TAKE 1 TABLET(10 MG) BY MOUTH AT BEDTIME   DULoxetine (CYMBALTA) 30 MG capsule Take 1 capsule (30 mg total) by mouth daily. Change to 1 - 2 as needed for pain.   estradiol (VIVELLE-DOT) 0.1 MG/24HR Cuevas Place 1 Cuevas (0.1 mg  total) onto the skin 2 (two) times a week. Dr Margaret Cuevas (Patient not taking: Reported on 11/13/2021)   gabapentin (NEURONTIN) 100 MG capsule Take 100 mg by mouth 3 (three) times daily. Rheum   nortriptyline (PAMELOR) 10 MG capsule Increase Nortriptyline to 30 mg nightly for two weeks, then can increase to 40 mg nightly if needed.   No facility-administered encounter medications on file as of 04/28/2022.    Social History: Social History   Tobacco Use   Smoking status: Some Days    Types: Cigarettes   Smokeless tobacco: Never  Substance Use Topics   Alcohol use: Yes   Drug use: No    Family Medical History: Family History   Problem Relation Age of Onset   Diabetes Mother    Hypertension Father    Cancer Maternal Grandmother    Diabetes Maternal Grandmother    Hypertension Maternal Grandmother    Hypertension Paternal Grandmother    Cancer Paternal Grandfather    Breast cancer Cousin 76       pat cousin    Physical Examination:  Today's Vitals   04/28/22 0912  BP: 114/64  Pulse: 80  Weight: 113.9 kg  Height: '5\' 6"'$  (1.676 m)  PainSc: 8   PainLoc: Back   Body mass index is 40.51 kg/m.   General: Patient is well developed, well nourished, calm, collected, and in no apparent distress. Attention to examination is appropriate.  Psychiatric: Patient is non-anxious.  Head:  Pupils equal, round, and reactive to light.  ENT:  Oral mucosa appears well hydrated.  Neck:   Supple.  Full range of motion.  Respiratory: Patient is breathing without any difficulty.  Extremities: No edema.  Vascular: Palpable dorsal pedal pulses.  Skin:   On exposed skin, there are no abnormal skin lesions.  NEUROLOGICAL:     Awake, alert, oriented to person, place, and time.  Speech is clear and fluent. Fund of knowledge is appropriate.   Cranial Nerves: Pupils equal round and reactive to light.  Facial tone is symmetric.  Facial sensation is symmetric.  ROM of spine: limited due to pain.  Palpation of spine: Diffuse point tenderness throughout her lumbosacral spine with worsening over her right SI joint.  Strength: Side Biceps Triceps Deltoid Interossei Grip Wrist Ext. Wrist Flex.  R '5 5 5 5 5 5 5  '$ L '5 5 5 5 5 5 5   '$ Side Iliopsoas Quads Hamstring PF DF EHL  R '5 5 5 5 5 5  '$ L '5 5 5 5 5 5   '$ Reflexes are 1+ and symmetric at the biceps, triceps, brachioradialis, patella and achilles.   Hoffman's is absent.  Clonus is not present.  Toes are down-going.  Bilateral upper and lower extremity sensation is intact to light touch.    Ambulates with a mildly antalgic gait  Medical Decision Making  Imaging: CT L  spine 03/25/22 IMPRESSION: 1. No acute osseous injury of the lumbar spine. 2. At L4-5 there is a broad right foraminal/lateral disc protrusion contacting the right exiting L4 nerve root. Mild right foraminal stenosis. 3. At L5-S1 there is a broad-based disc bulge. Moderate bilateral foraminal stenosis.     Electronically Signed   By: Kathreen Devoid M.D.   On: 03/25/2022 13:54  I have personally reviewed the images and agree with the above interpretation.  Assessment and Plan: Ms. Wollenberg is a pleasant 47 y.o. female with longstanding history of lumbosacral complaints worse over the last 3 months with associated radiculopathy worse  on the right than the left.  Given her CT results and feeling that her symptoms, I like to evaluate her further with a lumbar MRI.  I have also placed a referral to physical therapy which she would like to do through, and rehab and [facility] as she has been there in the past.  I placed a referral for this.  In addition to this I have increased her gabapentin.  She is currently prescribed 100 mg 3 times daily however due to medication side effects of drowsiness, she takes all 3 tablets at night.  I we will increase her dose to 400 mg nightly.   I will see her back in 4 to 6 weeks via telephone visit to review her MRI results and discuss her progress with physical therapy. She was encouraged to call the office in the interim should she have any questions or concerns.  She expressed understanding and was in agreement with this plan.   Thank you for involving me in the care of this patient.   I spent a total of 45 minutes in both face-to-face and non-face-to-face activities for this visit on the date of this encounter including review of records, review of imaging, discussion of symptoms, physical exam, discussion of treatment options, medication management, documentation, and order placement.   [name]

## 2022-04-30 ENCOUNTER — Encounter: Payer: Self-pay | Admitting: Family Medicine

## 2022-04-30 DIAGNOSIS — K625 Hemorrhage of anus and rectum: Secondary | ICD-10-CM | POA: Diagnosis not present

## 2022-05-01 ENCOUNTER — Encounter: Payer: Self-pay | Admitting: Gastroenterology

## 2022-05-03 NOTE — H&P (Signed)
Pre-Procedure H&P   Patient ID: Margaret Cuevas is a 47 y.o. female.  Gastroenterology Provider: [name], DO  Referring Provider: Dawson Bills, NP PCP: Juline Patch, MD  Date: 05/04/2022  HPI [name] is a 47 y.o. female who presents today for Esophagogastroduodenoscopy and Colonoscopy for Bright red blood per rectum, dysphagia, abdominal pain .  Patient reports 2 to 3 months of bright red blood per rectum which has worsened since her office visit.  She has also had incomplete evacuation.  Denies pain with defecation.  No melena or weight loss.  She has been dealing with nausea vomiting abdominal discomfort especially postprandially.  She notes xiphoid sticking when eating solid foods for years.  CT in 2022 was unremarkable Most recent lab work hemoglobin 11.3 MCV 90 platelets 231,000 creatinine 0.7 CRP 2 sed rate 15.  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  Tablet passed without issue.  A small hiatal hernia was noted.  Postsurgical anatomy is noted with GJ anastomosis widely patent.  However, posterolaterally there is a structure that filled with contrast with gradual emptying suspicious for gastro gastric fistula  Underwent EGD in October 2018 with gastritis negative for H. pylori, esophageal biopsies were negative  History positive for tobacco and NSAID use (Celebrex).    Past Medical History:  Diagnosis Date   Anxiety    Fibromyalgia     Past Surgical History:  Procedure Laterality Date   ABDOMINAL HYSTERECTOMY     ANKLE FRACTURE SURGERY     plates and screws placed on both sides   APPENDECTOMY     BACK SURGERY     CESAREAN SECTION     CHOLECYSTECTOMY     EAR TUBE REMOVAL     GASTRIC BYPASS  2006   ORIF FINGER /  THUMB FRACTURE     screws in thumb    Family History Mother colon polyps No other h/o GI disease or malignancy   Review of Systems  Constitutional:  Negative for activity change, appetite change, chills, diaphoresis, fatigue, fever and unexpected weight change.  HENT:  Positive for trouble swallowing. Negative for voice change.   Respiratory:  Negative for shortness of breath and wheezing.   Cardiovascular:  Negative for chest pain, palpitations and leg swelling.  Gastrointestinal:  Positive for abdominal pain, blood in stool, nausea and vomiting. Negative for abdominal distention, anal bleeding, constipation, diarrhea and rectal pain.  Musculoskeletal:  Negative for arthralgias and myalgias.  Skin:  Negative for color change and pallor.  Neurological:  Negative for dizziness, syncope and weakness.  Psychiatric/Behavioral:  Negative for confusion.   All other systems reviewed and are negative.    Medications No current facility-administered medications on file prior to encounter.   Current Outpatient Medications on File Prior to Encounter  Medication Sig Dispense Refill   busPIRone (BUSPAR) 7.5 MG tablet Take 1 tablet (7.5 mg total) by mouth 2 (two) times daily. 180 tablet 1   celecoxib (CELEBREX) 100 MG capsule Take 1 capsule by mouth 2 (two) times daily. Rheum     cetirizine (ZYRTEC) 10 MG tablet Take 1 tablet (10 mg total) by mouth daily. 90 tablet 1   cyclobenzaprine (FLEXERIL) 10 MG tablet TAKE 1 TABLET(10 MG) BY MOUTH AT BEDTIME 90 tablet 0   gabapentin (NEURONTIN) 100 MG capsule  Take 100 mg by mouth 3 (three) times daily. Rheum     nortriptyline (PAMELOR) 10 MG capsule Increase Nortriptyline to 30 mg nightly for two weeks, then can increase to 40 mg nightly if needed.     estradiol (VIVELLE-DOT) 0.1 MG/24HR patch Place 1 patch (0.1 mg total) onto the skin 2 (two) times a week. Dr Ouida Sills (Patient not taking: Reported on 11/13/2021) 8 patch 0    Pertinent medications  related to GI and procedure were reviewed by me with the patient prior to the procedure   Current Facility-Administered Medications:    0.9 %  sodium chloride infusion, , Intravenous, Continuous, [name], DO, Last Rate: 20 mL/hr at 05/04/22 0935, Continued from Pre-op at 05/04/22 0935      Allergies  Allergen Reactions   Ibuprofen Other (See Comments)    Motrin makes her throat swell. Can take ibuprofen   Allergies were reviewed by me prior to the procedure  Objective   Body mass index is 39.61 kg/m. Vitals:   05/04/22 0844  BP: (!) 128/93  Pulse: 75  Resp: 20  Temp: (!) 96.2 F (35.7 C)  TempSrc: Temporal  SpO2: 99%  Weight: 111.3 kg  Height: '5\' 6"'$  (1.676 m)     Physical Exam Vitals and nursing note reviewed.  Constitutional:      General: She is not in acute distress.    Appearance: Normal appearance. She is obese. She is not ill-appearing, toxic-appearing or diaphoretic.  HENT:     Head: Normocephalic and atraumatic.     Nose: Nose normal.     Mouth/Throat:     Mouth: Mucous membranes are moist.     Pharynx: Oropharynx is clear.  Eyes:     General: No scleral icterus.    Extraocular Movements: Extraocular movements intact.  Cardiovascular:     Rate and Rhythm: Normal rate and regular rhythm.     Heart sounds: Normal heart sounds. No murmur heard.    No friction rub. No gallop.  Pulmonary:     Effort: Pulmonary effort is normal. No respiratory distress.     Breath sounds: Normal breath sounds. No wheezing, rhonchi or rales.  Abdominal:     General: Bowel sounds are normal. There is no distension.     Palpations: Abdomen is soft.     Tenderness: There is abdominal tenderness (mild, luq). There is no guarding or rebound.  Musculoskeletal:     Cervical back: Neck supple.     Right lower leg: No edema.     Left lower leg: No edema.  Skin:    General: Skin is warm and dry.     Coloration: Skin is not jaundiced or pale.  Neurological:      General: No focal deficit present.     Mental Status: She is alert and oriented to person, place, and time. Mental status is at baseline.  Psychiatric:        Mood and Affect: Mood normal.        Behavior: Behavior normal.        Thought Content: Thought content normal.        Judgment: Judgment normal.      Assessment:  [name] is a 47 y.o. female  who presents today for Esophagogastroduodenoscopy and Colonoscopy for Bright red blood per rectum, dysphagia, abdominal pain .  Plan:  Esophagogastroduodenoscopy and Colonoscopy with possible intervention today  Esophagogastroduodenoscopy and Colonoscopy with possible biopsy, control of bleeding, polypectomy, and interventions as necessary has  been discussed with the patient/patient representative. Informed consent was obtained from the patient/patient representative after explaining the indication, nature, and risks of the procedure including but not limited to death, bleeding, perforation, missed neoplasm/lesions, cardiorespiratory compromise, and reaction to medications. Opportunity for questions was given and appropriate answers were provided. Patient/patient representative has verbalized understanding is amenable to undergoing the procedure.   [name], DO  [facility]  Portions of the record may have been created with voice recognition software. Occasional wrong-word or 'sound-a-like' substitutions may have occurred due to the inherent limitations of voice recognition software.  Read the chart carefully and recognize, using context, where substitutions may have occurred.

## 2022-05-04 ENCOUNTER — Ambulatory Visit: Payer: No Typology Code available for payment source | Admitting: General Practice

## 2022-05-04 ENCOUNTER — Encounter
Admission: RE | Disposition: A | Discharge status: Home / Self Care | Payer: Self-pay | Source: Home / Self Care | Attending: Gastroenterology

## 2022-05-04 ENCOUNTER — Encounter: Payer: Self-pay | Admitting: Gastroenterology

## 2022-05-04 ENCOUNTER — Ambulatory Visit
Admission: RE | Admit: 2022-05-04 | Discharge: 2022-05-04 | Disposition: A | Discharge status: Home / Self Care | Payer: No Typology Code available for payment source | Attending: Gastroenterology | Admitting: Gastroenterology

## 2022-05-04 DIAGNOSIS — R1310 Dysphagia, unspecified: Secondary | ICD-10-CM | POA: Insufficient documentation

## 2022-05-04 DIAGNOSIS — M797 Fibromyalgia: Secondary | ICD-10-CM | POA: Diagnosis not present

## 2022-05-04 DIAGNOSIS — K449 Diaphragmatic hernia without obstruction or gangrene: Secondary | ICD-10-CM | POA: Insufficient documentation

## 2022-05-04 DIAGNOSIS — R1012 Left upper quadrant pain: Secondary | ICD-10-CM | POA: Diagnosis not present

## 2022-05-04 DIAGNOSIS — K921 Melena: Secondary | ICD-10-CM | POA: Insufficient documentation

## 2022-05-04 DIAGNOSIS — Z6839 Body mass index (BMI) 39.0-39.9, adult: Secondary | ICD-10-CM | POA: Insufficient documentation

## 2022-05-04 DIAGNOSIS — K635 Polyp of colon: Secondary | ICD-10-CM | POA: Diagnosis not present

## 2022-05-04 DIAGNOSIS — K316 Fistula of stomach and duodenum: Secondary | ICD-10-CM | POA: Insufficient documentation

## 2022-05-04 DIAGNOSIS — R112 Nausea with vomiting, unspecified: Secondary | ICD-10-CM | POA: Diagnosis not present

## 2022-05-04 DIAGNOSIS — F17200 Nicotine dependence, unspecified, uncomplicated: Secondary | ICD-10-CM | POA: Insufficient documentation

## 2022-05-04 DIAGNOSIS — Z9049 Acquired absence of other specified parts of digestive tract: Secondary | ICD-10-CM | POA: Diagnosis not present

## 2022-05-04 DIAGNOSIS — K621 Rectal polyp: Secondary | ICD-10-CM | POA: Insufficient documentation

## 2022-05-04 DIAGNOSIS — K224 Dyskinesia of esophagus: Secondary | ICD-10-CM | POA: Insufficient documentation

## 2022-05-04 DIAGNOSIS — K640 First degree hemorrhoids: Secondary | ICD-10-CM | POA: Insufficient documentation

## 2022-05-04 DIAGNOSIS — F419 Anxiety disorder, unspecified: Secondary | ICD-10-CM | POA: Diagnosis not present

## 2022-05-04 DIAGNOSIS — Z9884 Bariatric surgery status: Secondary | ICD-10-CM | POA: Diagnosis not present

## 2022-05-04 HISTORY — PX: COLONOSCOPY: SHX5424

## 2022-05-04 HISTORY — PX: ESOPHAGOGASTRODUODENOSCOPY: SHX5428

## 2022-05-04 SURGERY — COLONOSCOPY
Anesthesia: General

## 2022-05-04 MED ORDER — SODIUM CHLORIDE 0.9 % IV SOLN
INTRAVENOUS | Status: DC
  Administered 2022-05-04: 20 mL/h via INTRAVENOUS

## 2022-05-04 MED ORDER — PROPOFOL 10 MG/ML IV BOLUS
INTRAVENOUS | Status: DC | PRN
  Administered 2022-05-04: 70 mg via INTRAVENOUS

## 2022-05-04 MED ORDER — LIDOCAINE HCL (CARDIAC) PF 100 MG/5ML IV SOSY
PREFILLED_SYRINGE | INTRAVENOUS | Status: DC | PRN
  Administered 2022-05-04: 80 mg via INTRAVENOUS

## 2022-05-04 MED ORDER — PROPOFOL 500 MG/50ML IV EMUL
INTRAVENOUS | Status: DC | PRN
  Administered 2022-05-04: 140 ug/kg/min via INTRAVENOUS

## 2022-05-04 MED ORDER — DEXMEDETOMIDINE HCL IN NACL 80 MCG/20ML IV SOLN
INTRAVENOUS | Status: DC | PRN
  Administered 2022-05-04 (×2): 8 ug via INTRAVENOUS

## 2022-05-04 NOTE — Op Note (Signed)
[facility] Gastroenterology Patient Name: Margaret Cuevas Procedure Date: 05/04/2022 9:39 AM MRN: [id number] Account #: [id number] Date of Birth: Sep 19, 1975 Admit Type: Outpatient Age: 47 Room: [facility] ENDO ROOM 2 Gender: Female Note Status: Supervisor Override Instrument Name: Altamese Cabal Endoscope [id number] Procedure:             Upper GI endoscopy Indications:           Abdominal pain in the left upper quadrant, Dysphagia Providers:             Rueben Bash, DO Referring MD:          Juline Patch, MD (Referring MD) Medicines:             Monitored Anesthesia Care Complications:         No immediate complications. Estimated blood loss: None. Procedure:             Pre-Anesthesia Assessment:                        - Prior to the procedure, a History and Physical was                         performed, and patient medications and allergies were                         reviewed. The patient is competent. The risks and                         benefits of the procedure and the sedation options and                         risks were discussed with the patient. All questions                         were answered and informed consent was obtained.                         Patient identification and proposed procedure were                         verified by the physician, the nurse, the anesthetist                         and the technician in the endoscopy suite. Mental                         Status Examination: alert and oriented. Airway                         Examination: normal oropharyngeal airway and neck                         mobility. Respiratory Examination: clear to                         auscultation. CV Examination: RRR, no murmurs, no S3                         or S4. Prophylactic Antibiotics: The patient does not  require prophylactic antibiotics. Prior                         Anticoagulants: The patient has taken no anticoagulant                          or antiplatelet agents. ASA Grade Assessment: III - A                         patient with severe systemic disease. After reviewing                         the risks and benefits, the patient was deemed in                         satisfactory condition to undergo the procedure. The                         anesthesia plan was to use monitored anesthesia care                         (MAC). Immediately prior to administration of                         medications, the patient was re-assessed for adequacy                         to receive sedatives. The heart rate, respiratory                         rate, oxygen saturations, blood pressure, adequacy of                         pulmonary ventilation, and response to care were                         monitored throughout the procedure. The physical                         status of the patient was re-assessed after the                         procedure.                        After obtaining informed consent, the endoscope was                         passed under direct vision. Throughout the procedure,                         the patient's blood pressure, pulse, and oxygen                         saturations were monitored continuously. The Endoscope                         was introduced through the mouth, and advanced to the  anastomosis site of gastric bypass. The upper GI                         endoscopy was accomplished without difficulty. The                         patient tolerated the procedure well. Findings:      The Z-line was regular. Estimated blood loss: none.      Esophagogastric landmarks were identified: the gastroesophageal junction       was found at 35 cm from the incisors.      Abnormal motility was noted in the esophagus. The cricopharyngeus was       normal. There is spasticity of the esophageal body. The distal       esophagus/lower esophageal sphincter is open.  Tertiary peristaltic waves       are noted.      A small hiatal hernia was present. Estimated blood loss: none.      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 This       was characterized by healthy appearing mucosa and visible sutures.       Estimated blood loss: none.      A 1 mm fistula was found in the gastric body. Estimated blood loss:       none. Seen approximately 40 cm from incisors      The exam was otherwise without abnormality.      Small bowel appeared healthy with normal mucosa. Estimated blood loss:       none. Impression:            - Z-line regular.                        - Esophagogastric landmarks identified.                        - Abnormal esophageal motility, consistent with                         presbyesophagus.                        - Small hiatal hernia.                        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                        - Gastric fistula.                        - The examination was otherwise normal.                        - No specimens collected. Recommendation:        - Patient has a contact number available for                         emergencies. The signs and symptoms of potential  delayed complications were discussed with the patient.                         Return to normal activities tomorrow. Written                         discharge instructions were provided to the patient.                        - Discharge patient to home.                        - Resume previous diet.                        - Continue present medications.                        - Refer to a surgeon as previously scheduled.                        - proceed with colonoscopy                        - The findings and recommendations were discussed with                         the patient. 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 All rights reserved. The codes documented in this report are preliminary and upon coder review may  be revised to meet current compliance requirements. Attending Participation:      I personally  performed the entire procedure. Volney American, DO [name] DO, DO 05/04/2022 10:07:12 AM This report has been signed electronically. Number of Addenda: 0 Note Initiated On: 05/04/2022 9:39 AM Estimated Blood Loss:  Estimated blood loss: none.      [facility]

## 2022-05-04 NOTE — Anesthesia Postprocedure Evaluation (Signed)
Anesthesia Post Note  Patient: Marcell Pfeifer  Procedure(s) Performed: COLONOSCOPY ESOPHAGOGASTRODUODENOSCOPY (EGD)  Patient location during evaluation: Endoscopy Anesthesia Type: General Level of consciousness: awake and alert Pain management: pain level controlled Vital Signs Assessment: post-procedure vital signs reviewed and stable Respiratory status: spontaneous breathing, nonlabored ventilation, respiratory function stable and patient connected to nasal cannula oxygen Cardiovascular status: blood pressure returned to baseline and stable Postop Assessment: no apparent nausea or vomiting Anesthetic complications: no  No notable events documented.   Last Vitals:  Vitals:   05/04/22 1049 05/04/22 1059  BP: (!) 123/91 116/84  Pulse: 78 82  Resp: 14 20  Temp:    SpO2: 100% 98%    Last Pain:  Vitals:   05/04/22 1059  TempSrc:   PainSc: 0-No pain                 Dimas Millin

## 2022-05-04 NOTE — Anesthesia Preprocedure Evaluation (Addendum)
Anesthesia Evaluation  Patient identified by MRN, date of birth, ID band Patient awake    Reviewed: Allergy & Precautions, NPO status , Patient's Chart, lab work & pertinent test results  History of Anesthesia Complications Negative for: history of anesthetic complications  Airway Mallampati: III  TM Distance: >3 FB Neck ROM: full    Dental  (+) Chipped   Pulmonary neg COPD, Current Smoker   Pulmonary exam normal        Cardiovascular negative cardio ROS Normal cardiovascular exam     Neuro/Psych  PSYCHIATRIC DISORDERS Anxiety      Neuromuscular disease    GI/Hepatic negative GI ROS, Neg liver ROS,,,  Endo/Other    Morbid obesity  Renal/GU negative Renal ROS  negative genitourinary   Musculoskeletal   Abdominal   Peds  Hematology negative hematology ROS (+)   Anesthesia Other Findings Past Medical History: No date: Anxiety No date: Fibromyalgia  Past Surgical History: No date: ABDOMINAL HYSTERECTOMY No date: ANKLE FRACTURE SURGERY     Comment:  plates and screws placed on both sides No date: APPENDECTOMY No date: BACK SURGERY No date: CESAREAN SECTION No date: CHOLECYSTECTOMY No date: EAR TUBE REMOVAL 2006: GASTRIC BYPASS No date: ORIF FINGER / THUMB FRACTURE     Comment:  screws in thumb  BMI    Body Mass Index: 39.61 kg/m      Reproductive/Obstetrics negative OB ROS                             Anesthesia Physical Anesthesia Plan  ASA: 3  Anesthesia Plan: General   Post-op Pain Management: Minimal or no pain anticipated   Induction: Intravenous  PONV Risk Score and Plan: 3 and Propofol infusion, TIVA and Ondansetron  Airway Management Planned: Nasal Cannula  Additional Equipment: None  Intra-op Plan:   Post-operative Plan:   Informed Consent: I have reviewed the patients History and Physical, chart, labs and discussed the procedure including the risks,  benefits and alternatives for the proposed anesthesia with the patient or authorized representative who has indicated his/her understanding and acceptance.     Dental advisory given  Plan Discussed with: CRNA and Surgeon  Anesthesia Plan Comments: (Discussed risks of anesthesia with patient, including possibility of difficulty with spontaneous ventilation under anesthesia necessitating airway intervention, PONV, and rare risks such as cardiac or respiratory or neurological events, and allergic reactions. Discussed the role of CRNA in patient's perioperative care. Patient understands.)       Anesthesia Quick Evaluation

## 2022-05-04 NOTE — Transfer of Care (Signed)
Immediate Anesthesia Transfer of Care Note  Patient: Margaret Cuevas  Procedure(s) Performed: COLONOSCOPY ESOPHAGOGASTRODUODENOSCOPY (EGD)  Patient Location: PACU  Anesthesia Type:General  Level of Consciousness: awake, alert , and oriented  Airway & Oxygen Therapy: Patient Spontanous Breathing  Post-op Assessment: Report given to RN and Post -op Vital signs reviewed and stable  Post vital signs: Reviewed and stable  Last Vitals:  Vitals Value Taken Time  BP 103/55 05/04/22 1039  Temp 35.7 C 05/04/22 1039  Pulse 73 05/04/22 1039  Resp 26 05/04/22 1040  SpO2 98 % 05/04/22 1039  Vitals shown include unvalidated device data.  Last Pain:  Vitals:   05/04/22 1039  TempSrc: Tympanic  PainSc: Asleep         Complications: No notable events documented.

## 2022-05-04 NOTE — Op Note (Signed)
[facility] Patient Name: Margaret Cuevas Procedure Date: 05/04/2022 9:39 AM MRN: [id number] Account #: [id number] Date of Birth: 03/17/1976 Admit Type: Outpatient Age: 47 Room: [facility] ENDO ROOM 2 Gender: Female Note Status: Finalized Instrument Name: Colonoscope [id number] Procedure:             Colonoscopy Indications:           Hematochezia Providers:             [name] DO, DO Referring MD:          Juline Patch, MD (Referring MD) Medicines:             Monitored Anesthesia Care Complications:         No immediate complications. Estimated blood loss:                         Minimal. Procedure:             Pre-Anesthesia Assessment:                        - Prior to the procedure, a History and Physical was                         performed, and patient medications and allergies were                         reviewed. The patient is competent. The risks and                         benefits of the procedure and the sedation options and                         risks were discussed with the patient. All questions                         were answered and informed consent was obtained.                         Patient identification and proposed procedure were                         verified by the physician, the nurse, the anesthetist                         and the technician in the endoscopy suite. Mental                         Status Examination: alert and oriented. Airway                         Examination: normal oropharyngeal airway and neck                         mobility. Respiratory Examination: clear to                         auscultation. CV Examination: RRR, no murmurs, no S3  or S4. Prophylactic Antibiotics: The patient does not                         require prophylactic antibiotics. Prior                         Anticoagulants: The patient has taken no anticoagulant                         or  antiplatelet agents. ASA Grade Assessment: III - A                         patient with severe systemic disease. After reviewing                         the risks and benefits, the patient was deemed in                         satisfactory condition to undergo the procedure. The                         anesthesia plan was to use monitored anesthesia care                         (MAC). Immediately prior to administration of                         medications, the patient was re-assessed for adequacy                         to receive sedatives. The heart rate, respiratory                         rate, oxygen saturations, blood pressure, adequacy of                         pulmonary ventilation, and response to care were                         monitored throughout the procedure. The physical                         status of the patient was re-assessed after the                         procedure.                        After obtaining informed consent, the colonoscope was                         passed under direct vision. Throughout the procedure,                         the patient's blood pressure, pulse, and oxygen                         saturations were monitored continuously. The  Colonoscope was introduced through the anus and                         advanced to the the cecum, identified by appendiceal                         orifice and ileocecal valve. The colonoscopy was                         performed without difficulty. The patient tolerated                         the procedure well. The quality of the bowel                         preparation was evaluated using the BBPS [facility] Bowel                         Preparation Scale) with scores of: Right Colon = 3,                         Transverse Colon = 3 and Left Colon = 3 (entire mucosa                         seen well with no residual staining, small fragments                         of stool or  opaque liquid). The total BBPS score                         equals 9. The ileocecal valve, appendiceal orifice,                         and rectum were photographed. Findings:      The perianal and digital rectal examinations were normal. Pertinent       negatives include normal sphincter tone.      Two sessile polyps were found in the descending colon and ascending       colon. The polyps were 2 to 3 mm in size. These polyps were removed with       a cold snare. Resection and retrieval were complete. Estimated blood       loss was minimal.      A 1 to 2 mm polyp was found in the rectum. The polyp was sessile. The       polyp was removed with a jumbo cold forceps. Resection and retrieval       were complete. Estimated blood loss was minimal.      Non-bleeding internal hemorrhoids were found during retroflexion. The       hemorrhoids were Grade I (internal hemorrhoids that do not prolapse).       Estimated blood loss: none.      The exam was otherwise without abnormality on direct and retroflexion       views. Impression:            - Two 2 to 3 mm polyps in the descending colon and in  the ascending colon, removed with a cold snare.                         Resected and retrieved.                        - One 1 to 2 mm polyp in the rectum, removed with a                         jumbo cold forceps. Resected and retrieved.                        - Non-bleeding internal hemorrhoids.                        - The examination was otherwise normal on direct and                         retroflexion views. Recommendation:        - Patient has a contact number available for                         emergencies. The signs and symptoms of potential                         delayed complications were discussed with the patient.                         Return to normal activities tomorrow. Written                         discharge instructions were provided to the patient.                         - Discharge patient to home.                        - Resume previous diet.                        - Continue present medications.                        - Await pathology results.                        - Repeat colonoscopy for surveillance based on                         pathology results.                        - Return to GI office as previously scheduled.                        - The findings and recommendations were discussed with                         the patient. Procedure Code(s):     --- Professional ---  45385, Colonoscopy, flexible; with removal of                         tumor(s), polyp(s), or other lesion(s) by snare                         technique                        45380, 59, Colonoscopy, flexible; with biopsy, single                         or multiple Diagnosis Code(s):     --- Professional ---                        D12.4, Benign neoplasm of descending colon                        D12.2, Benign neoplasm of ascending colon                        D12.8, Benign neoplasm of rectum                        K64.0, First degree hemorrhoids                        K92.1, Melena (includes Hematochezia) CPT copyright 2022 American Medical Association. All rights reserved. The codes documented in this report are preliminary and upon coder review may  be revised to meet current compliance requirements. Attending Participation:      I personally performed the entire procedure. Volney American, DO [name] DO, DO 05/04/2022 10:40:35 AM This report has been signed electronically. Number of Addenda: 0 Note Initiated On: 05/04/2022 9:39 AM Scope Withdrawal Time: 0 hours 16 minutes 18 seconds  Total Procedure Duration: 0 hours 26 minutes 25 seconds  Estimated Blood Loss:  Estimated blood loss was minimal.      [facility]

## 2022-05-04 NOTE — Interval H&P Note (Signed)
History and Physical Interval Note: Preprocedure H&P from 05/04/22  was reviewed and there was no interval change after seeing and examining the patient.  Written consent was obtained from the patient after discussion of risks, benefits, and alternatives. 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  The various methods of treatment have been discussed with the patient and family. After consideration of risks, benefits and other options for treatment, the patient has consented to  Procedure(s): COLONOSCOPY (N/A) ESOPHAGOGASTRODUODENOSCOPY (EGD) (N/A) as a surgical intervention.  The patient's history has been reviewed, patient examined, no change in status, stable for surgery.  I have reviewed the patient's chart and labs.  Questions were answered to the patient's satisfaction.     [name]

## 2022-05-05 ENCOUNTER — Encounter: Payer: Self-pay | Admitting: Gastroenterology

## 2022-05-05 DIAGNOSIS — E669 Obesity, unspecified: Secondary | ICD-10-CM | POA: Diagnosis not present

## 2022-05-06 LAB — SURGICAL PATHOLOGY

## 2022-05-13 ENCOUNTER — Encounter: Payer: Self-pay | Admitting: Family Medicine

## 2022-05-13 ENCOUNTER — Ambulatory Visit (INDEPENDENT_AMBULATORY_CARE_PROVIDER_SITE_OTHER): Payer: No Typology Code available for payment source | Admitting: Family Medicine

## 2022-05-13 VITALS — BP 110/78 | HR 86 | Ht 66.0 in | Wt 257.0 lb

## 2022-05-13 DIAGNOSIS — J302 Other seasonal allergic rhinitis: Secondary | ICD-10-CM | POA: Diagnosis not present

## 2022-05-13 DIAGNOSIS — F419 Anxiety disorder, unspecified: Secondary | ICD-10-CM | POA: Diagnosis not present

## 2022-05-13 DIAGNOSIS — R8290 Unspecified abnormal findings in urine: Secondary | ICD-10-CM | POA: Diagnosis not present

## 2022-05-13 LAB — POCT URINALYSIS DIPSTICK
Bilirubin, UA: NEGATIVE
Blood, UA: NEGATIVE
Glucose, UA: NEGATIVE
Ketones, UA: NEGATIVE
Leukocytes, UA: NEGATIVE
Nitrite, UA: NEGATIVE
Protein, UA: NEGATIVE
Spec Grav, UA: 1.015 (ref 1.010–1.025)
Urobilinogen, UA: 0.2 E.U./dL
pH, UA: 5 (ref 5.0–8.0)

## 2022-05-13 MED ORDER — BUSPIRONE HCL 7.5 MG PO TABS: 7.5000 mg | ORAL_TABLET | Freq: Two times a day (BID) | ORAL | 1 refills | Status: DC

## 2022-05-13 MED ORDER — CETIRIZINE HCL 10 MG PO TABS: 10.0000 mg | ORAL_TABLET | Freq: Every day | ORAL | 1 refills | Status: DC

## 2022-05-13 NOTE — Addendum Note (Signed)
Addended by: Fredderick Severance on: 05/13/2022 08:52 AM   Modules accepted: Orders

## 2022-05-13 NOTE — Progress Notes (Signed)
Date:  05/13/2022   Name:  Margaret Cuevas   DOB:  [birth year]   MRN:  [id number]   Chief Complaint: Anxiety, Flu Vaccine, Allergic Rhinitis , and urine odor  Anxiety Presents for follow-up visit. Symptoms include depressed mood, dry mouth, excessive worry, nervous/anxious behavior and palpitations. Patient reports no chest pain, dizziness, insomnia, nausea, panic, shortness of breath or suicidal ideas. Symptoms occur occasionally.    URI  This is a chronic (for chronic allergic rhinitis) problem. The current episode started more than 1 year ago. The problem has been gradually improving. Pertinent negatives include no chest pain, congestion, coughing, dysuria, headaches, nausea, neck pain, plugged ear sensation, rash, rhinorrhea, sneezing, sore throat or wheezing. She has tried antihistamine for the symptoms. The treatment provided mild relief.    Lab Results  Component Value Date   NA 141 03/25/2022   K 4.0 03/25/2022   CO2 28 03/25/2022   GLUCOSE 99 03/25/2022   BUN 21 (H) 03/25/2022   CREATININE 0.72 03/25/2022   CALCIUM 8.7 (L) 03/25/2022   EGFR 101 11/14/2021   GFRNONAA >60 03/25/2022   Lab Results  Component Value Date   CHOL 191 11/14/2021   HDL 79 11/14/2021   LDLCALC 99 11/14/2021   TRIG 71 11/14/2021   Lab Results  Component Value Date   TSH 2.650 12/31/2020   Lab Results  Component Value Date   HGBA1C 5.8 (H) 01/08/2021   Lab Results  Component Value Date   WBC 5.6 03/25/2022   HGB 12.5 03/25/2022   HCT 38.3 03/25/2022   MCV 87.2 03/25/2022   PLT 239 03/25/2022   Lab Results  Component Value Date   ALT 15 06/27/2020   AST 19 06/27/2020   ALKPHOS 55 06/27/2020   BILITOT 0.5 06/27/2020   No results found for: "25OHVITD2", "25OHVITD3", "VD25OH"   Review of Systems  Constitutional:  Negative for chills and fever.  HENT:  Negative for congestion, rhinorrhea, sneezing and sore throat.   Respiratory:  Negative for cough, chest tightness, shortness of  breath and wheezing.   Cardiovascular:  Positive for palpitations. Negative for chest pain.  Gastrointestinal:  Negative for nausea.  Genitourinary:  Negative for dysuria, frequency, hematuria and urgency.  Musculoskeletal:  Negative for back pain, myalgias and neck pain.  Skin:  Negative for rash.  Allergic/Immunologic: Negative for environmental allergies.  Neurological:  Negative for dizziness and headaches.  Hematological:  Does not bruise/bleed easily.  Psychiatric/Behavioral:  Negative for suicidal ideas. The patient is nervous/anxious. The patient does not have insomnia.     Patient Active Problem List   Diagnosis Date Noted   H/O total hysterectomy 07/15/2020   History of appendectomy 07/15/2020   Lipoma of back 11/15/2018   Fibromyalgia 10/25/2017   Influenza vaccine needed 01/20/2017    Allergies  Allergen Reactions   Ibuprofen Other (See Comments)    Motrin makes her throat swell. Can take ibuprofen    Past Surgical History:  Procedure Laterality Date   ABDOMINAL HYSTERECTOMY     ANKLE FRACTURE SURGERY     plates and screws placed on both sides   APPENDECTOMY     BACK SURGERY     CESAREAN SECTION     CHOLECYSTECTOMY     COLONOSCOPY N/A 05/04/2022   Procedure: COLONOSCOPY;  Surgeon: [name], DO;  Location: [facility] ENDOSCOPY;  Service: Gastroenterology;  Laterality: N/A;   EAR TUBE REMOVAL     ESOPHAGOGASTRODUODENOSCOPY N/A 05/04/2022   Procedure: ESOPHAGOGASTRODUODENOSCOPY (EGD);  Surgeon: Virgina Jock,  Richardo Hanks, DO;  Location: [facility] ENDOSCOPY;  Service: Gastroenterology;  Laterality: N/A;   GASTRIC BYPASS  2006   ORIF FINGER / THUMB FRACTURE     screws in thumb    Social History   Tobacco Use   Smoking status: Some Days    Types: Cigarettes   Smokeless tobacco: Never  Substance Use Topics   Alcohol use: Yes   Drug use: No     Medication list has been reviewed and updated.  Current Meds  Medication Sig   busPIRone (BUSPAR) 7.5 MG tablet  Take 1 tablet (7.5 mg total) by mouth 2 (two) times daily.   celecoxib (CELEBREX) 100 MG capsule Take 1 capsule by mouth 2 (two) times daily. Rheum   cetirizine (ZYRTEC) 10 MG tablet Take 1 tablet (10 mg total) by mouth daily.   cyclobenzaprine (FLEXERIL) 10 MG tablet TAKE 1 TABLET(10 MG) BY MOUTH AT BEDTIME   DULoxetine (CYMBALTA) 60 MG capsule Take 60 mg by mouth. Once or twice a day   estradiol (VIVELLE-DOT) 0.1 MG/24HR patch Place 1 patch (0.1 mg total) onto the skin 2 (two) times a week. Dr Schermerhorn   gabapentin (NEURONTIN) 100 MG capsule Take 100 mg by mouth 3 (three) times daily. Rheum   gabapentin (NEURONTIN) 100 MG capsule Take 1 capsule (100 mg total) by mouth at bedtime.   nortriptyline (PAMELOR) 10 MG capsule neurology       05/13/2022    8:07 AM 11/13/2021    4:06 PM 10/13/2021    8:04 AM 05/15/2021    8:10 AM  GAD 7 : Generalized Anxiety Score  Nervous, Anxious, on Edge 0 '2 2 2  '$ Control/stop worrying 0 '1 1 2  '$ Worry too much - different things 0 1 0 2  Trouble relaxing 0 2 0 0  Restless 0 1 0 0  Easily annoyed or irritable 0 2 3 0  Afraid - awful might happen 0 3 0 0  Total GAD 7 Score 0 '12 6 6  '$ Anxiety Difficulty Not difficult at all Very difficult Somewhat difficult Not difficult at all       05/13/2022    8:07 AM 11/13/2021    4:06 PM 10/13/2021    8:03 AM  Depression screen PHQ 2/9  Decreased Interest 0 2 2  Down, Depressed, Hopeless 0 2 3  PHQ - 2 Score 0 4 5  Altered sleeping 0 1 0  Tired, decreased energy 0 3 3  Change in appetite 0 1 0  Feeling bad or failure about yourself  0 1 0  Trouble concentrating 0 1 0  Moving slowly or fidgety/restless 0 1 0  Suicidal thoughts 0 0 0  PHQ-9 Score 0 12 8  Difficult doing work/chores Not difficult at all Very difficult Somewhat difficult    BP Readings from Last 3 Encounters:  05/13/22 110/78  05/04/22 116/84  04/28/22 114/64    Physical Exam Vitals and nursing note reviewed. Exam conducted with a  chaperone present.  Constitutional:      General: She is not in acute distress.    Appearance: She is not diaphoretic.  HENT:     Head: Normocephalic and atraumatic.     Right Ear: Tympanic membrane and external ear normal.     Left Ear: Tympanic membrane and external ear normal.     Nose: Nose normal.     Mouth/Throat:     Mouth: Mucous membranes are moist.  Eyes:     General:  Right eye: No discharge.        Left eye: No discharge.     Conjunctiva/sclera: Conjunctivae normal.     Pupils: Pupils are equal, round, and reactive to light.  Neck:     Thyroid: No thyromegaly.     Vascular: No JVD.  Cardiovascular:     Rate and Rhythm: Normal rate and regular rhythm.     Heart sounds: Normal heart sounds, S1 normal and S2 normal. No murmur heard.    No systolic murmur is present.     No diastolic murmur is present.     No friction rub. No gallop. No S3 or S4 sounds.  Pulmonary:     Effort: Pulmonary effort is normal.     Breath sounds: Normal breath sounds. No decreased breath sounds, wheezing or rhonchi.  Abdominal:     General: Bowel sounds are normal.     Palpations: Abdomen is soft. There is no mass.     Tenderness: There is no abdominal tenderness. There is no guarding.  Musculoskeletal:     Cervical back: Normal range of motion and neck supple.  Lymphadenopathy:     Cervical: No cervical adenopathy.  Skin:    General: Skin is warm and dry.  Neurological:     Mental Status: She is alert.     Deep Tendon Reflexes: Reflexes are normal and symmetric.     Wt Readings from Last 3 Encounters:  05/13/22 257 lb (116.6 kg)  05/04/22 245 lb 6.4 oz (111.3 kg)  04/28/22 251 lb (113.9 kg)    BP 110/78   Pulse 86   Ht '5\' 6"'$  (1.676 m)   Wt 257 lb (116.6 kg)   SpO2 98%   BMI 41.48 kg/m   Assessment and Plan: 1. Anxiety Chronic.  Controlled.  Stable.  PHQ is 0.  GAD score 0.  Continue buspirone 7.5 mg twice a day. - busPIRone (BUSPAR) 7.5 MG tablet; Take 1 tablet  (7.5 mg total) by mouth 2 (two) times daily.  Dispense: 180 tablet; Refill: 1  2. Seasonal allergic rhinitis, unspecified trigger .  Controlled.  Stable.  Symptomatology is controlled with Zyrtec 10 mg daily and will continue this as well. - cetirizine (ZYRTEC) 10 MG tablet; Take 1 tablet (10 mg total) by mouth daily.  Dispense: 90 tablet; Refill: 1     Otilio Miu, MD

## 2022-05-14 ENCOUNTER — Ambulatory Visit
Admission: RE | Admit: 2022-05-14 | Discharge: 2022-05-14 | Disposition: A | Discharge status: Home / Self Care | Payer: No Typology Code available for payment source | Source: Ambulatory Visit | Attending: Neurosurgery | Admitting: Neurosurgery

## 2022-05-14 DIAGNOSIS — M47817 Spondylosis without myelopathy or radiculopathy, lumbosacral region: Secondary | ICD-10-CM | POA: Diagnosis not present

## 2022-05-18 ENCOUNTER — Ambulatory Visit: Payer: BC Managed Care – PPO | Admitting: Family Medicine

## 2022-05-21 ENCOUNTER — Ambulatory Visit: Payer: No Typology Code available for payment source | Attending: Neurosurgery | Admitting: Physical Therapy

## 2022-05-21 DIAGNOSIS — M47817 Spondylosis without myelopathy or radiculopathy, lumbosacral region: Secondary | ICD-10-CM | POA: Insufficient documentation

## 2022-05-21 DIAGNOSIS — M6281 Muscle weakness (generalized): Secondary | ICD-10-CM

## 2022-05-21 DIAGNOSIS — M5459 Other low back pain: Secondary | ICD-10-CM | POA: Diagnosis not present

## 2022-05-21 NOTE — Therapy (Signed)
OUTPATIENT PHYSICAL THERAPY THORACOLUMBAR EVALUATION   Patient Name: Ayshia Gramlich MRN: [id number] DOB:11/26/1975, 47 y.o., female Today's Date: 05/21/2022  END OF SESSION:  PT End of Session - 05/21/22 1724     Visit Number 1    Number of Visits 12    Date for PT Re-Evaluation 07/02/22    PT Start Time 9628            3662 to 1833  (55 minutes).   Past Medical History:  Diagnosis Date   Anxiety    Fibromyalgia    Past Surgical History:  Procedure Laterality Date   ABDOMINAL HYSTERECTOMY     ANKLE FRACTURE SURGERY     plates and screws placed on both sides   APPENDECTOMY     BACK SURGERY     CESAREAN SECTION     CHOLECYSTECTOMY     COLONOSCOPY N/A 05/04/2022   Procedure: COLONOSCOPY;  Surgeon: [name], DO;  Location: [facility] ENDOSCOPY;  Service: Gastroenterology;  Laterality: N/A;   EAR TUBE REMOVAL     ESOPHAGOGASTRODUODENOSCOPY N/A 05/04/2022   Procedure: ESOPHAGOGASTRODUODENOSCOPY (EGD);  Surgeon: [name], DO;  Location: [facility] ENDOSCOPY;  Service: Gastroenterology;  Laterality: N/A;   GASTRIC BYPASS  2006   ORIF FINGER / THUMB FRACTURE     screws in thumb   Patient Active Problem List   Diagnosis Date Noted   H/O total hysterectomy 07/15/2020   History of appendectomy 07/15/2020   Lipoma of back 11/15/2018   Fibromyalgia 10/25/2017   Influenza vaccine needed 01/20/2017    PCP: Juline Patch, MD  REFERRING PROVIDER: Loleta Dicker, PA  REFERRING DIAG: [phone] (ICD-10-CM) - Lumbosacral spondylosis without myelopathy   Rationale for Evaluation and Treatment: Rehabilitation  THERAPY DIAG:  Other low back pain Muscle weakness (generalized)  ONSET DATE: >3 months  SUBJECTIVE:                                                                                                                                                                                           SUBJECTIVE STATEMENT: Pt. Reports back "locks up" and states pain  is severe (10/10 at worst).  Pt. Unaware of movement that is causing pain.  Pt. States pain "feels like someone is hitting me with an axe in back".  Pt. Reports R LE radicular symptoms to calf.    PERTINENT HISTORY:  History of Present Illness: 04/28/2022 Ms. Kseniya Grunden is a 47 y.o with a history of anxiety, fibromyalgia who is here today with a chief complaint of back pain. She was seen in the ER on 03/25/22 for this. At the time she complained  of back pain more severe than usual with strong point tenderness along the midline of her lumbar spine without an recent inciting event. At the time she was taking gabapentin, Flexeril, and Tylenol. At lumbar CT scan was done which was without acute fracture but did show signs concerning for broad disc protrusions at L4-5 and L5-S1. Today she reports a 3 month history of increased low back pain although she endorses about 1 year of vaginal and groin pain.  She describes pain that starts in the middle of her back and radiates to her tailbone and into her bilateral hips worse on the right and some pain into her posterior thighs. She states that she will occasionally have severe sharp pains in her third toe on her right foot.  She will also occasionally have radiating pain down her right leg and this occurs typically when she feels as though her back is "locking up".  Her symptoms seem to do better with standing and leaning forward and worse with sitting.  She denies any weakness in her lower extremities or bowel or bladder incontinence. She underwent PT at [facility] for vaginal pain without any significant improvement. She states she was told symptoms are likely coming from her back and was given back exercises however she did not undergo any formal physical therapy for her low back. Note, she reports smoking tobacco socially.   Past Surgery: Lumbar discectomy in her teens   Leiya Keesey has no symptoms of cervical myelopathy.   The symptoms are causing a  significant impact on the patient's life.  NEUROLOGICAL:     Awake, alert, oriented to person, place, and time.  Speech is clear and fluent. Fund of knowledge is appropriate.    Cranial Nerves: Pupils equal round and reactive to light.  Facial tone is symmetric.  Facial sensation is symmetric.  ROM of spine: limited due to pain.  Palpation of spine: Diffuse point tenderness throughout her lumbosacral spine with worsening over her right SI joint.   Strength: Side Biceps Triceps Deltoid Interossei Grip Wrist Ext. Wrist Flex.  R '5 5 5 5 5 5 5  '$ L '5 5 5 5 5 5 5    '$ Side Iliopsoas Quads Hamstring PF DF EHL  R '5 5 5 5 5 5  '$ L '5 5 5 5 5 5    '$ Reflexes are 1+ and symmetric at the biceps, triceps, brachioradialis, patella and achilles.   Hoffman's is absent.  Clonus is not present.  Toes are down-going.  Bilateral upper and lower extremity sensation is intact to light touch.    Ambulates with a mildly antalgic gait.  Assessment and Plan: Ms. Steffensmeier is a pleasant 47 y.o. female with longstanding history of lumbosacral complaints worse over the last 3 months with associated radiculopathy worse on the right than the left.  Given her CT results and feeling that her symptoms, I like to evaluate her further with a lumbar MRI.  I have also placed a referral to physical therapy which she would like to do through, and rehab and [place] as she has been there in the past.  I placed a referral for this.  In addition to this I have increased her gabapentin.  She is currently prescribed 100 mg 3 times daily however due to medication side effects of drowsiness, she takes all 3 tablets at night.  I we will increase her dose to 400 mg nightly.   I will see her back in 4 to 6 weeks via telephone visit  to review her MRI results and discuss her progress with physical therapy. She was encouraged to call the office in the interim should she have any questions or concerns.  She expressed understanding and was in agreement with  this plan.  PAIN:  Are you having pain? Yes: NPRS scale: 5/10 Pain location: low back/ R SI/ hip Pain description: "like a vice" Aggravating factors: certain movements/ stairs Relieving factors: medications  PRECAUTIONS: None  WEIGHT BEARING RESTRICTIONS: No  FALLS:  Has patient fallen in last 6 months? No  LIVING ENVIRONMENT: Lives with: lives with their family Lives in: House/apartment Stairs: entry stairs.  Difficulty due to back pain.   Has following equipment at home: None  OCCUPATION: Seated job at [employer].  Has a standing/ adjustable desk.    PLOF: Independent  PATIENT GOALS: Decrease low back pain/ radicular symptoms.    NEXT MD VISIT: 06/02/22 with Neurosurgery office.    OBJECTIVE:   DIAGNOSTIC FINDINGS:  CLINICAL DATA:  Chronic, progressively worsening low back pain radiating into the right hip. No injury or prior surgery.   EXAM: MRI LUMBAR SPINE WITHOUT CONTRAST   TECHNIQUE: Multiplanar, multisequence MR imaging of the lumbar spine was performed. No intravenous contrast was administered.   COMPARISON:  CT lumbar spine dated March 25, 2022.   FINDINGS: Segmentation:  Standard.   Alignment:  Physiologic.   Vertebrae:  No fracture, evidence of discitis, or bone lesion.   Conus medullaris and cauda equina: Conus extends to the L1-L2 level. Conus and cauda equina appear normal.   Paraspinal and other soft tissues: Negative.   Disc levels:   T12-L1:  Negative.   L1-L2:  Negative.   L2-L3:  Minimal disc bulging.  No stenosis.   L3-L4:  Mild disc bulging.  No stenosis.   L4-L5: Mild disc bulging with unchanged superimposed large right foraminal and far lateral disc protrusion contacting and displacing the exiting right L4 nerve root. Mild right neuroforaminal stenosis. No spinal canal or left neuroforaminal stenosis.   L5-S1: Unchanged circumferential disc osteophyte complex eccentric to the right. Unchanged mild bilateral facet arthropathy.  Unchanged mild-to-moderate bilateral neuroforaminal stenosis. No spinal canal stenosis.   IMPRESSION: 1. Unchanged large right foraminal and far lateral disc protrusion at L4-L5 contacting and displacing the exiting right L4 nerve root. 2. 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   Electronically Signed   By: Titus Dubin M.D.   On: 05/15/2022 10:24  PATIENT SURVEYS:  FOTO initial 42/ goal 58  SCREENING FOR RED FLAGS: Bowel or bladder incontinence: Yes:   Spinal tumors: No Cauda equina syndrome: No Compression fracture: No Abdominal aneurysm: No  COGNITION: Overall cognitive status: Within functional limits for tasks assessed     SENSATION: TBD  MUSCLE LENGTH: Hamstrings: Right 17 deg (pain limited); Left 30 deg Thomas test: N/T at this time (pain).    POSTURE: rounded shoulders and forward head  PALPATION: (+) R superior glut and L4-5 tenderness  LUMBAR ROM:   AROM eval  Flexion 55 deg. (No radicular symptoms).  Extension 15 deg.  Right lateral flexion 50% limited  Left lateral flexion 25% limited  Right rotation 75% limited  Left rotation Unable (shooting pain reported).     (Blank rows = not tested)  Pt. Sleeps on R side at night.  Pt. Occasionally uses pillow between knees.    LOWER EXTREMITY ROM:       Testing limited due to increase in R LE symptoms/ exacerbation of R lower leg/ foot radicular symptoms "  pins and needles".    Active  Right eval Left eval  Hip flexion Very limited due to pain 90 deg. (Pain)  Hip extension    Hip abduction    Hip adduction    Hip internal rotation 31 deg. 34 deg.  Hip external rotation 24 deg. 24 deg.  Knee flexion    Knee extension    Ankle dorsiflexion    Ankle plantarflexion    Ankle inversion    Ankle eversion     (Blank rows = not tested)  LOWER EXTREMITY MMT:    MMT Right eval Left eval  Hip flexion 4/5 4/5  Hip extension    Hip abduction    Hip adduction     Hip internal rotation 4/5 4/5  Hip external rotation 4/5 4/5  Knee flexion 4/5 5/5  Knee extension 5/5 5/5  Ankle dorsiflexion    Ankle plantarflexion    Ankle inversion    Ankle eversion     (Blank rows = not tested)  LUMBAR SPECIAL TESTS:  Straight leg raise test: Positive (R LE positive)  FUNCTIONAL TESTS:  TBD  GAIT: Distance walked: In clinic Assistive device utilized: None Level of assistance: Complete Independence Comments: R antalgic gait noted.    TODAY'S TREATMENT:                                                                                                                              DATE: 05/21/22   Evaluation/ issued HEP  PATIENT EDUCATION:  Education details: Access Code: [email or web address] Person educated: Patient Education method: Explanation, Demonstration, and Handouts Education comprehension: verbalized understanding and returned demonstration  HOME EXERCISE PROGRAM: Access Code: [email or web address] URL: [email or web address] Date: 05/21/2022 Prepared by: Dorcas Carrow  Exercises - Lower Trunk Rotations  - 1 x daily - 7 x weekly - 3 sets - 10 reps - Hooklying Single Knee to Chest Stretch  - 1 x daily - 7 x weekly - 3 sets - 10 reps - Seated Pelvic Tilt  - 1 x daily - 7 x weekly - 3 sets - 10 reps - Seated Diaphragmatic Breathing  - 1 x daily - 7 x weekly - 3 sets - 10 reps  ASSESSMENT:  CLINICAL IMPRESSION: Patient is a 47 y.o. female who was seen today for physical therapy evaluation and treatment for low back pain with R LE radicular sypmptoms.  Pt. Had significant c/o back pain and limited during ROM/ strength testing.  Unable to assess lumbar mobility due to pain.  No improvement in pain with position changes.  Pt. Will benefit from skilled PT services to develop strength program/ pain mgmt. To improve mobility.    OBJECTIVE IMPAIRMENTS: Abnormal gait, decreased activity tolerance, decreased endurance, decreased mobility, difficulty walking,  decreased ROM, decreased strength, hypomobility, impaired flexibility, improper body mechanics, postural dysfunction, obesity, and pain.   ACTIVITY LIMITATIONS: lifting, bending, standing, squatting, stairs, and locomotion level  PARTICIPATION LIMITATIONS: cleaning, driving, community activity, and occupation  PERSONAL FACTORS: Fitness and Past/current experiences are also affecting patient's functional outcome.   REHAB POTENTIAL: Good  CLINICAL DECISION MAKING: Evolving/moderate complexity  EVALUATION COMPLEXITY: Moderate   GOALS: Goals reviewed with patient? Yes  SHORT TERM GOALS: Target date: 06/11/22  Pt. Independent with HEP to increase lumbar AROM to [facility] (all planes) to improve pain-free mobility.   Baseline:  see above Goal status: INITIAL   LONG TERM GOALS: Target date: 07/02/22  Pt. Will increase FOTO to 58 to improve pain-free mobility.   Baseline: initial 42 Goal status: INITIAL  2.  Pt. Will increase B LE muscle strength 1/2 muscle grade to improve standing tolerance/ daily mobility at home/work.   Baseline:  see above Goal status: INITIAL  3.  Pt. Will report 50% improvement in overall low back pain with no radicular symptoms to improve functional mobility.  Baseline: 10/10 low back pain during evaluation with radicular symptoms.  Goal status: INITIAL   PLAN:  PT FREQUENCY: 2x/week  PT DURATION: 6 weeks  PLANNED INTERVENTIONS: Therapeutic exercises, Therapeutic activity, Neuromuscular re-education, Balance training, Gait training, Patient/Family education, Self Care, and Joint mobilization.  Trigger point dry needling/ Modalities.    PLAN FOR NEXT SESSION: Pain mgmt (TENS). Reassess HEP/ complete evaluation if pain improved.    Pura Spice, PT, DPT # [phone] 05/21/2022, 5:26 PM

## 2022-05-26 ENCOUNTER — Ambulatory Visit: Payer: No Typology Code available for payment source | Admitting: Physical Therapy

## 2022-05-26 DIAGNOSIS — M5459 Other low back pain: Secondary | ICD-10-CM

## 2022-05-26 DIAGNOSIS — M6281 Muscle weakness (generalized): Secondary | ICD-10-CM

## 2022-05-26 DIAGNOSIS — M47817 Spondylosis without myelopathy or radiculopathy, lumbosacral region: Secondary | ICD-10-CM | POA: Diagnosis not present

## 2022-05-26 NOTE — Therapy (Unsigned)
OUTPATIENT PHYSICAL THERAPY THORACOLUMBAR TREATMENT   Patient Name: Margaret Cuevas MRN: [id number] DOB:1975-05-01, 47 y.o., female Today's Date: 05/26/2022  END OF SESSION:  PT End of Session - 05/26/22 1732     Visit Number 2    Number of Visits 12    Date for PT Re-Evaluation 07/02/22    PT Start Time 1732    PT Stop Time 1821    PT Time Calculation (min) 49 min    Activity Tolerance Patient tolerated treatment well;Patient limited by pain    Behavior During Therapy [facility] for tasks assessed/performed            1738 to 1833  (55 minutes).   Past Medical History:  Diagnosis Date   Anxiety    Fibromyalgia    Past Surgical History:  Procedure Laterality Date   ABDOMINAL HYSTERECTOMY     ANKLE FRACTURE SURGERY     plates and screws placed on both sides   APPENDECTOMY     BACK SURGERY     CESAREAN SECTION     CHOLECYSTECTOMY     COLONOSCOPY N/A 05/04/2022   Procedure: COLONOSCOPY;  Surgeon: [name], DO;  Location: [facility] ENDOSCOPY;  Service: Gastroenterology;  Laterality: N/A;   EAR TUBE REMOVAL     ESOPHAGOGASTRODUODENOSCOPY N/A 05/04/2022   Procedure: ESOPHAGOGASTRODUODENOSCOPY (EGD);  Surgeon: [name], DO;  Location: [facility] ENDOSCOPY;  Service: Gastroenterology;  Laterality: N/A;   GASTRIC BYPASS  2006   ORIF FINGER / THUMB FRACTURE     screws in thumb   Patient Active Problem List   Diagnosis Date Noted   H/O total hysterectomy 07/15/2020   History of appendectomy 07/15/2020   Lipoma of back 11/15/2018   Fibromyalgia 10/25/2017   Influenza vaccine needed 01/20/2017    PCP: Juline Patch, MD  REFERRING PROVIDER: Loleta Dicker, PA  REFERRING DIAG: [phone] (ICD-10-CM) - Lumbosacral spondylosis without myelopathy   Rationale for Evaluation and Treatment: Rehabilitation  THERAPY DIAG:  Other low back pain Muscle weakness (generalized)  ONSET DATE: >3 months  SUBJECTIVE:                                                                                                                                                                                            SUBJECTIVE STATEMENT: Pt. Reports back "locks up" and states pain is severe (10/10 at worst).  Pt. Unaware of movement that is causing pain.  Pt. States pain "feels like someone is hitting me with an axe in back".  Pt. Reports R LE radicular symptoms to calf.    PERTINENT HISTORY:  History of Present Illness: 04/28/2022 Margaret Cuevas is a 47 y.o with a history of anxiety, fibromyalgia who is here today with a chief complaint of back pain. She was seen in the ER on 03/25/22 for this. At the time she complained of back pain more severe than usual with strong point tenderness along the midline of her lumbar spine without an recent inciting event. At the time she was taking gabapentin, Flexeril, and Tylenol. At lumbar CT scan was done which was without acute fracture but did show signs concerning for broad disc protrusions at L4-5 and L5-S1. Today she reports a 3 month history of increased low back pain although she endorses about 1 year of vaginal and groin pain.  She describes pain that starts in the middle of her back and radiates to her tailbone and into her bilateral hips worse on the right and some pain into her posterior thighs. She states that she will occasionally have severe sharp pains in her third toe on her right foot.  She will also occasionally have radiating pain down her right leg and this occurs typically when she feels as though her back is "locking up".  Her symptoms seem to do better with standing and leaning forward and worse with sitting.  She denies any weakness in her lower extremities or bowel or bladder incontinence. She underwent PT at [facility] for vaginal pain without any significant improvement. She states she was told symptoms are likely coming from her back and was given back exercises however she did not undergo any formal physical therapy for her low  back. Note, she reports smoking tobacco socially.   Past Surgery: Lumbar discectomy in her teens   Margaret Cuevas has no symptoms of cervical myelopathy.   The symptoms are causing a significant impact on the patient's life.  NEUROLOGICAL:     Awake, alert, oriented to person, place, and time.  Speech is clear and fluent. Fund of knowledge is appropriate.    Cranial Nerves: Pupils equal round and reactive to light.  Facial tone is symmetric.  Facial sensation is symmetric.  ROM of spine: limited due to pain.  Palpation of spine: Diffuse point tenderness throughout her lumbosacral spine with worsening over her right SI joint.   Strength: Side Biceps Triceps Deltoid Interossei Grip Wrist Ext. Wrist Flex.  R '5 5 5 5 5 5 5  '$ L '5 5 5 5 5 5 5    '$ Side Iliopsoas Quads Hamstring PF DF EHL  R '5 5 5 5 5 5  '$ L '5 5 5 5 5 5    '$ Reflexes are 1+ and symmetric at the biceps, triceps, brachioradialis, patella and achilles.   Hoffman's is absent.  Clonus is not present.  Toes are down-going.  Bilateral upper and lower extremity sensation is intact to light touch.    Ambulates with a mildly antalgic gait.  Assessment and Plan: Margaret Cuevas is a pleasant 47 y.o. female with longstanding history of lumbosacral complaints worse over the last 3 months with associated radiculopathy worse on the right than the left.  Given her CT results and feeling that her symptoms, I like to evaluate her further with a lumbar MRI.  I have also placed a referral to physical therapy which she would like to do through, and rehab and [place] as she has been there in the past.  I placed a referral for this.  In addition to this I have increased her gabapentin.  She is currently prescribed 100 mg 3 times daily however due to  medication side effects of drowsiness, she takes all 3 tablets at night.  I we will increase her dose to 400 mg nightly.   I will see her back in 4 to 6 weeks via telephone visit to review her MRI results and discuss  her progress with physical therapy. She was encouraged to call the office in the interim should she have any questions or concerns.  She expressed understanding and was in agreement with this plan.  PAIN:  Are you having pain? Yes: NPRS scale: 5/10 Pain location: low back/ R SI/ hip Pain description: "like a vice" Aggravating factors: certain movements/ stairs Relieving factors: medications  PRECAUTIONS: None  WEIGHT BEARING RESTRICTIONS: No  FALLS:  Has patient fallen in last 6 months? No  LIVING ENVIRONMENT: Lives with: lives with their family Lives in: House/apartment Stairs: entry stairs.  Difficulty due to back pain.   Has following equipment at home: None  OCCUPATION: Seated job at [employer].  Has a standing/ adjustable desk.    PLOF: Independent  PATIENT GOALS: Decrease low back pain/ radicular symptoms.    NEXT MD VISIT: 06/02/22 with Neurosurgery office.    OBJECTIVE:   DIAGNOSTIC FINDINGS:  CLINICAL DATA:  Chronic, progressively worsening low back pain radiating into the right hip. No injury or prior surgery.   EXAM: MRI LUMBAR SPINE WITHOUT CONTRAST   TECHNIQUE: Multiplanar, multisequence MR imaging of the lumbar spine was performed. No intravenous contrast was administered.   COMPARISON:  CT lumbar spine dated March 25, 2022.   FINDINGS: Segmentation:  Standard.   Alignment:  Physiologic.   Vertebrae:  No fracture, evidence of discitis, or bone lesion.   Conus medullaris and cauda equina: Conus extends to the L1-L2 level. Conus and cauda equina appear normal.   Paraspinal and other soft tissues: Negative.   Disc levels:   T12-L1:  Negative.   L1-L2:  Negative.   L2-L3:  Minimal disc bulging.  No stenosis.   L3-L4:  Mild disc bulging.  No stenosis.   L4-L5: Mild disc bulging with unchanged superimposed large right foraminal and far lateral disc protrusion contacting and displacing the exiting right L4 nerve root. Mild right neuroforaminal  stenosis. No spinal canal or left neuroforaminal stenosis.   L5-S1: Unchanged circumferential disc osteophyte complex eccentric to the right. Unchanged mild bilateral facet arthropathy. Unchanged mild-to-moderate bilateral neuroforaminal stenosis. No spinal canal stenosis.   IMPRESSION: 1. Unchanged large right foraminal and far lateral disc protrusion at L4-L5 contacting and displacing the exiting right L4 nerve root. 2. 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   Electronically Signed   By: Titus Dubin M.D.   On: 05/15/2022 10:24  PATIENT SURVEYS:  FOTO initial 42/ goal 58  SCREENING FOR RED FLAGS: Bowel or bladder incontinence: Yes:   Spinal tumors: No Cauda equina syndrome: No Compression fracture: No Abdominal aneurysm: No  COGNITION: Overall cognitive status: Within functional limits for tasks assessed     SENSATION: TBD  MUSCLE LENGTH: Hamstrings: Right 17 deg (pain limited); Left 30 deg Thomas test: N/T at this time (pain).    POSTURE: rounded shoulders and forward head  PALPATION: (+) R superior glut and L4-5 tenderness  LUMBAR ROM:   AROM eval  Flexion 55 deg. (No radicular symptoms).  Extension 15 deg.  Right lateral flexion 50% limited  Left lateral flexion 25% limited  Right rotation 75% limited  Left rotation Unable (shooting pain reported).     (Blank rows = not tested)  Pt. Sleeps on R side  at night.  Pt. Occasionally uses pillow between knees.    LOWER EXTREMITY ROM:       Testing limited due to increase in R LE symptoms/ exacerbation of R lower leg/ foot radicular symptoms "pins and needles".    Active  Right eval Left eval  Hip flexion Very limited due to pain 90 deg. (Pain)  Hip extension    Hip abduction    Hip adduction    Hip internal rotation 31 deg. 34 deg.  Hip external rotation 24 deg. 24 deg.  Knee flexion    Knee extension    Ankle dorsiflexion    Ankle plantarflexion    Ankle  inversion    Ankle eversion     (Blank rows = not tested)  LOWER EXTREMITY MMT:    MMT Right eval Left eval  Hip flexion 4/5 4/5  Hip extension    Hip abduction    Hip adduction    Hip internal rotation 4/5 4/5  Hip external rotation 4/5 4/5  Knee flexion 4/5 5/5  Knee extension 5/5 5/5  Ankle dorsiflexion    Ankle plantarflexion    Ankle inversion    Ankle eversion     (Blank rows = not tested)  LUMBAR SPECIAL TESTS:  Straight leg raise test: Positive (R LE positive)  FUNCTIONAL TESTS:  TBD  GAIT: Distance walked: In clinic Assistive device utilized: None Level of assistance: Complete Independence Comments: R antalgic gait noted.    TODAY'S TREATMENT:                                                                                                                              DATE: 05/26/22   Subjective: Pt. Arrived to PT clinic reported 5/10 LBP. Pt. Reports compliance with home exercise program. Pt. Reports some pulling and burning with certain exercises. Pt. Reports severe pain when standing from low services in her house. Pt. States radiating pain that mostly goes into the R LE and occasionally into the L buttock.  There Ex.: -Seated back stretches with blue ball. Frwd./lateral. 5x each with 10 sec. Hold.  -B Hamstring stretches in supine with added nerve glide. 1x10 each side. Pain increased to 6.5/10 on NPS in LB.  -Seated marches(slow/controlled) 2x10. Pt. Had an increase in pain to 6.5/10 on NPS.  -Seated pelvic tilts. 2x10 reps. Pt. Cued to maintain stable lumbar spine and focus on pelvis movement.  -Seated pelvic clocks on the blue ball in front of the //-bars for UE support to maintain balance if needed. 2x10 reps.   -MH applied to LB to decrease pain. 10 min.   PATIENT EDUCATION:  Education details: Access Code: [email or web address] Person educated: Patient Education method: Explanation, Demonstration, and Handouts Education comprehension: verbalized understanding  and returned demonstration  HOME EXERCISE PROGRAM: Access Code: [email or web address] URL: [email or web address] Date: 05/21/2022 Prepared by: Dorcas Carrow  Exercises - Lower Trunk Rotations  - 1 x daily - 7 x weekly - 3 sets -  10 reps - Hooklying Single Knee to Chest Stretch  - 1 x daily - 7 x weekly - 3 sets - 10 reps - Seated Pelvic Tilt  - 1 x daily - 7 x weekly - 3 sets - 10 reps - Seated Diaphragmatic Breathing  - 1 x daily - 7 x weekly - 3 sets - 10 reps  ASSESSMENT:  CLINICAL IMPRESSION: Pt. Arrived to PT reporting 5/10 LBP pain on NPS. Pt. Continues to be highly irratible with most movements involving her lumbar spine. Pt has increased pain of 6.5/10 on NPS with there ex. Activity and stretches. Pt. Requires frequent breaks to try and calm down her sx. PT performed nerve glides on pt. To try and decrease the amount of radiating sx. the pt. Is having into her buttock/R leg. Pt. Required some cuing during pelvic tilts/clocks to isolate hip motion and prevent excess lumbar motion. Pt. Educated on effectiveness of TENS unit and attempting the treatment next session to calm down her sx. to promote pain free/effective there. Ex. Pt. Will benefit from skilled PT services to develop strength program/ pain mgmt. To improve funcitonal mobility and QoL.   OBJECTIVE IMPAIRMENTS: Abnormal gait, decreased activity tolerance, decreased endurance, decreased mobility, difficulty walking, decreased ROM, decreased strength, hypomobility, impaired flexibility, improper body mechanics, postural dysfunction, obesity, and pain.   ACTIVITY LIMITATIONS: lifting, bending, standing, squatting, stairs, and locomotion level  PARTICIPATION LIMITATIONS: cleaning, driving, community activity, and occupation  PERSONAL FACTORS: Fitness and Past/current experiences are also affecting patient's functional outcome.   REHAB POTENTIAL: Good  CLINICAL DECISION MAKING: Evolving/moderate complexity  EVALUATION  COMPLEXITY: Moderate   GOALS: Goals reviewed with patient? Yes  SHORT TERM GOALS: Target date: 06/11/22  Pt. Independent with HEP to increase lumbar AROM to [facility] (all planes) to improve pain-free mobility.   Baseline:  see above Goal status: INITIAL   LONG TERM GOALS: Target date: 07/02/22  Pt. Will increase FOTO to 58 to improve pain-free mobility.   Baseline: initial 42 Goal status: INITIAL  2.  Pt. Will increase B LE muscle strength 1/2 muscle grade to improve standing tolerance/ daily mobility at home/work.   Baseline:  see above Goal status: INITIAL  3.  Pt. Will report 50% improvement in overall low back pain with no radicular symptoms to improve functional mobility.  Baseline: 10/10 low back pain during evaluation with radicular symptoms.  Goal status: INITIAL   PLAN:  PT FREQUENCY: 2x/week  PT DURATION: 6 weeks  PLANNED INTERVENTIONS: Therapeutic exercises, Therapeutic activity, Neuromuscular re-education, Balance training, Gait training, Patient/Family education, Self Care, and Joint mobilization.  Trigger point dry needling/ Modalities.    PLAN FOR NEXT SESSION: Pain mgmt (TENS). Reassess HEP/ complete evaluation if pain improved.    Margaret Cuevas, SPT Pura Spice, PT, DPT # [phone] 05/26/2022, 6:56 PM

## 2022-05-28 ENCOUNTER — Ambulatory Visit: Payer: No Typology Code available for payment source | Attending: Neurosurgery | Admitting: Physical Therapy

## 2022-05-28 DIAGNOSIS — M5459 Other low back pain: Secondary | ICD-10-CM | POA: Diagnosis not present

## 2022-05-28 DIAGNOSIS — M6281 Muscle weakness (generalized): Secondary | ICD-10-CM | POA: Insufficient documentation

## 2022-05-28 NOTE — Therapy (Signed)
OUTPATIENT PHYSICAL THERAPY THORACOLUMBAR TREATMENT   Patient Name: Margaret Cuevas MRN: [id number] DOB:Dec 30, 1975, 47 y.o., female Today's Date: 05/28/2022  END OF SESSION:  PT End of Session - 05/28/22 1827     Visit Number 3    Number of Visits 12    Date for PT Re-Evaluation 07/02/22    PT Start Time 6270    PT Stop Time 1815    PT Time Calculation (min) 44 min    Equipment Utilized During Treatment Other (comment)    Activity Tolerance Patient limited by pain;Patient tolerated treatment well    Behavior During Therapy [facility] for tasks assessed/performed            Past Medical History:  Diagnosis Date   Anxiety    Fibromyalgia    Past Surgical History:  Procedure Laterality Date   ABDOMINAL HYSTERECTOMY     ANKLE FRACTURE SURGERY     plates and screws placed on both sides   APPENDECTOMY     BACK SURGERY     CESAREAN SECTION     CHOLECYSTECTOMY     COLONOSCOPY N/A 05/04/2022   Procedure: COLONOSCOPY;  Surgeon: [name], DO;  Location: [facility] ENDOSCOPY;  Service: Gastroenterology;  Laterality: N/A;   EAR TUBE REMOVAL     ESOPHAGOGASTRODUODENOSCOPY N/A 05/04/2022   Procedure: ESOPHAGOGASTRODUODENOSCOPY (EGD);  Surgeon: [name], DO;  Location: [facility] ENDOSCOPY;  Service: Gastroenterology;  Laterality: N/A;   GASTRIC BYPASS  2006   ORIF FINGER / THUMB FRACTURE     screws in thumb   Patient Active Problem List   Diagnosis Date Noted   H/O total hysterectomy 07/15/2020   History of appendectomy 07/15/2020   Lipoma of back 11/15/2018   Fibromyalgia 10/25/2017   Influenza vaccine needed 01/20/2017    PCP: Juline Patch, MD  REFERRING PROVIDER: Loleta Dicker, PA  REFERRING DIAG: [id number] (ICD-10-CM) - Lumbosacral spondylosis without myelopathy   Rationale for Evaluation and Treatment: Rehabilitation  THERAPY DIAG:  Other low back pain Muscle weakness (generalized)  ONSET DATE: >3 months  SUBJECTIVE:                                                                                                                                                                                            SUBJECTIVE STATEMENT:   EVALUATION Pt. Reports back "locks up" and states pain is severe (10/10 at worst).  Pt. Unaware of movement that is causing pain.  Pt. States pain "feels like someone is hitting me with an axe in back".  Pt. Reports R LE radicular symptoms to calf.    PERTINENT HISTORY:  History of  Present Illness: 04/28/2022 [name] is a 47 y.o with a history of anxiety, fibromyalgia who is here today with a chief complaint of back pain. She was seen in the ER on 03/25/22 for this. At the time she complained of back pain more severe than usual with strong point tenderness along the midline of her lumbar spine without an recent inciting event. At the time she was taking gabapentin, Flexeril, and Tylenol. At lumbar CT scan was done which was without acute fracture but did show signs concerning for broad disc protrusions at L4-5 and L5-S1. Today she reports a 3 month history of increased low back pain although she endorses about 1 year of vaginal and groin pain.  She describes pain that starts in the middle of her back and radiates to her tailbone and into her bilateral hips worse on the right and some pain into her posterior thighs. She states that she will occasionally have severe sharp pains in her third toe on her right foot.  She will also occasionally have radiating pain down her right leg and this occurs typically when she feels as though her back is "locking up".  Her symptoms seem to do better with standing and leaning forward and worse with sitting.  She denies any weakness in her lower extremities or bowel or bladder incontinence. She underwent PT at [facility] for vaginal pain without any significant improvement. She states she was told symptoms are likely coming from her back and was given back exercises however she did not undergo any  formal physical therapy for her low back. Note, she reports smoking tobacco socially.   Past Surgery: Lumbar discectomy in her teens   Margaret Cuevas has no symptoms of cervical myelopathy.   The symptoms are causing a significant impact on the patient's life.  NEUROLOGICAL:     Awake, alert, oriented to person, place, and time.  Speech is clear and fluent. Fund of knowledge is appropriate.    Cranial Nerves: Pupils equal round and reactive to light.  Facial tone is symmetric.  Facial sensation is symmetric.  ROM of spine: limited due to pain.  Palpation of spine: Diffuse point tenderness throughout her lumbosacral spine with worsening over her right SI joint.   Strength: Side Biceps Triceps Deltoid Interossei Grip Wrist Ext. Wrist Flex.  R 5 5 5 5 5 5 5  $ L 5 5 5 5 5 5 5    $ Side Iliopsoas Quads Hamstring PF DF EHL  R 5 5 5 5 5 5  $ L 5 5 5 5 5 5    $ Reflexes are 1+ and symmetric at the biceps, triceps, brachioradialis, patella and achilles.   Hoffman's is absent.  Clonus is not present.  Toes are down-going.  Bilateral upper and lower extremity sensation is intact to light touch.    Ambulates with a mildly antalgic gait.  Assessment and Plan: [name] is a pleasant 47 y.o. female with longstanding history of lumbosacral complaints worse over the last 3 months with associated radiculopathy worse on the right than the left.  Given her CT results and feeling that her symptoms, I like to evaluate her further with a lumbar MRI.  I have also placed a referral to physical therapy which she would like to do through, and rehab and [place] as she has been there in the past.  I placed a referral for this.  In addition to this I have increased her gabapentin.  She is currently prescribed 100 mg 3 times  daily however due to medication side effects of drowsiness, she takes all 3 tablets at night.  I we will increase her dose to 400 mg nightly.   I will see her back in 4 to 6 weeks via telephone visit to  review her MRI results and discuss her progress with physical therapy. She was encouraged to call the office in the interim should she have any questions or concerns.  She expressed understanding and was in agreement with this plan.  PAIN:  Are you having pain? Yes: NPRS scale: 5/10 Pain location: low back/ R SI/ hip Pain description: "like a vice" Aggravating factors: certain movements/ stairs Relieving factors: medications  PRECAUTIONS: None  WEIGHT BEARING RESTRICTIONS: No  FALLS:  Has patient fallen in last 6 months? No  LIVING ENVIRONMENT: Lives with: lives with their family Lives in: House/apartment Stairs: entry stairs.  Difficulty due to back pain.   Has following equipment at home: None  OCCUPATION: Seated job at [employer].  Has a standing/ adjustable desk.    PLOF: Independent  PATIENT GOALS: Decrease low back pain/ radicular symptoms.    NEXT MD VISIT: 06/02/22 with Neurosurgery office.    OBJECTIVE:   DIAGNOSTIC FINDINGS:  CLINICAL DATA:  Chronic, progressively worsening low back pain radiating into the right hip. No injury or prior surgery.   EXAM: MRI LUMBAR SPINE WITHOUT CONTRAST   TECHNIQUE: Multiplanar, multisequence MR imaging of the lumbar spine was performed. No intravenous contrast was administered.   COMPARISON:  CT lumbar spine dated March 25, 2022.   FINDINGS: Segmentation:  Standard.   Alignment:  Physiologic.   Vertebrae:  No fracture, evidence of discitis, or bone lesion.   Conus medullaris and cauda equina: Conus extends to the L1-L2 level. Conus and cauda equina appear normal.   Paraspinal and other soft tissues: Negative.   Disc levels:   T12-L1:  Negative.   L1-L2:  Negative.   L2-L3:  Minimal disc bulging.  No stenosis.   L3-L4:  Mild disc bulging.  No stenosis.   L4-L5: Mild disc bulging with unchanged superimposed large right foraminal and far lateral disc protrusion contacting and displacing the exiting right L4 nerve  root. Mild right neuroforaminal stenosis. No spinal canal or left neuroforaminal stenosis.   L5-S1: Unchanged circumferential disc osteophyte complex eccentric to the right. Unchanged mild bilateral facet arthropathy. Unchanged mild-to-moderate bilateral neuroforaminal stenosis. No spinal canal stenosis.   IMPRESSION: 1. Unchanged large right foraminal and far lateral disc protrusion at L4-L5 contacting and displacing the exiting right L4 nerve root. 2. 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   Electronically Signed   By: Titus Dubin M.D.   On: 05/15/2022 10:24  PATIENT SURVEYS:  FOTO initial 42/ goal 58  SCREENING FOR RED FLAGS: Bowel or bladder incontinence: Yes:   Spinal tumors: No Cauda equina syndrome: No Compression fracture: No Abdominal aneurysm: No  COGNITION: Overall cognitive status: Within functional limits for tasks assessed     SENSATION: TBD  MUSCLE LENGTH: Hamstrings: Right 17 deg (pain limited); Left 30 deg Thomas test: N/T at this time (pain).    POSTURE: rounded shoulders and forward head  PALPATION: (+) R superior glut and L4-5 tenderness  LUMBAR ROM:   AROM eval  Flexion 55 deg. (No radicular symptoms).  Extension 15 deg.  Right lateral flexion 50% limited  Left lateral flexion 25% limited  Right rotation 75% limited  Left rotation Unable (shooting pain reported).     (Blank rows = not tested)  Pt.  Sleeps on R side at night.  Pt. Occasionally uses pillow between knees.    LOWER EXTREMITY ROM:       Testing limited due to increase in R LE symptoms/ exacerbation of R lower leg/ foot radicular symptoms "pins and needles".    Active  Right eval Left eval  Hip flexion Very limited due to pain 90 deg. (Pain)  Hip extension    Hip abduction    Hip adduction    Hip internal rotation 31 deg. 34 deg.  Hip external rotation 24 deg. 24 deg.  Knee flexion    Knee extension    Ankle dorsiflexion    Ankle  plantarflexion    Ankle inversion    Ankle eversion     (Blank rows = not tested)  LOWER EXTREMITY MMT:    MMT Right eval Left eval  Hip flexion 4/5 4/5  Hip extension    Hip abduction    Hip adduction    Hip internal rotation 4/5 4/5  Hip external rotation 4/5 4/5  Knee flexion 4/5 5/5  Knee extension 5/5 5/5  Ankle dorsiflexion    Ankle plantarflexion    Ankle inversion    Ankle eversion     (Blank rows = not tested)  LUMBAR SPECIAL TESTS:  Straight leg raise test: Positive (R LE positive)  FUNCTIONAL TESTS:  TBD  GAIT: Distance walked: In clinic Assistive device utilized: None Level of assistance: Complete Independence Comments: R antalgic gait noted.    TODAY'S TREATMENT:                                                                                                                              DATE: 05/28/22   Subjective: Pt. Arrived to PT clinic reporting extremely high pain. Pt. Reported that she was unable to attend work and had her son drive her to the clinic. Pt. Stated she would like to try the take home unit to decrease pain and will check back in with [place] at her next appt. the following week.   Education/MT: -Tens unit application and instruction. (44 min.).   EMPI Continuum unit: 60pps/ preset pulse duration.  PT reviewed proper electrode placement/ management of unit and intensity.  Pt. Will use over weekend for pain mgmt.   PATIENT EDUCATION:  Education details: Access Code: [email or web address] Person educated: Patient Education method: Explanation, Demonstration, and Handouts Education comprehension: verbalized understanding and returned demonstration  HOME EXERCISE PROGRAM: Access Code: [email or web address] URL: [email or web address] Date: 05/21/2022 Prepared by: Dorcas Carrow  Exercises - Lower Trunk Rotations  - 1 x daily - 7 x weekly - 3 sets - 10 reps - Hooklying Single Knee to Chest Stretch  - 1 x daily - 7 x weekly - 3 sets - 10 reps - Seated  Pelvic Tilt  - 1 x daily - 7 x weekly - 3 sets - 10 reps - Seated Diaphragmatic Breathing  - 1 x daily - 7 x weekly -  3 sets - 10 reps  ASSESSMENT:  CLINICAL IMPRESSION: Pt. Arrived to PT reporting very high LBP. PT chose to avoid exercise during this treatment session due to the extreme irritability of the patient's LB. Pt. Was given a take home TENS unit and educated on how to properly use it. PT applied unit to patient during the Tx. Session to ensure safety and effectiveness of the take home unit. Pt. Showed understanding of the treatment method and had all of her concerns/questions addressed. Pt. Will return to physical therapy next week for PT to reassess pain level/level of function and continue POC then. Pt. Will continue to benefit from skilled PT treatment to decrease pain and improve functional mobility and strength of the lumbar spine.   OBJECTIVE IMPAIRMENTS: Abnormal gait, decreased activity tolerance, decreased endurance, decreased mobility, difficulty walking, decreased ROM, decreased strength, hypomobility, impaired flexibility, improper body mechanics, postural dysfunction, obesity, and pain.   ACTIVITY LIMITATIONS: lifting, bending, standing, squatting, stairs, and locomotion level  PARTICIPATION LIMITATIONS: cleaning, driving, community activity, and occupation  PERSONAL FACTORS: Fitness and Past/current experiences are also affecting patient's functional outcome.   REHAB POTENTIAL: Good  CLINICAL DECISION MAKING: Evolving/moderate complexity  EVALUATION COMPLEXITY: Moderate   GOALS: Goals reviewed with patient? Yes  SHORT TERM GOALS: Target date: 06/11/22  Pt. Independent with HEP to increase lumbar AROM to [facility] (all planes) to improve pain-free mobility.   Baseline:  see above Goal status: INITIAL   LONG TERM GOALS: Target date: 07/02/22  Pt. Will increase FOTO to 58 to improve pain-free mobility.   Baseline: initial 42 Goal status: INITIAL  2.  Pt. Will  increase B LE muscle strength 1/2 muscle grade to improve standing tolerance/ daily mobility at home/work.   Baseline:  see above Goal status: INITIAL  3.  Pt. Will report 50% improvement in overall low back pain with no radicular symptoms to improve functional mobility.  Baseline: 10/10 low back pain during evaluation with radicular symptoms.  Goal status: INITIAL   PLAN:  PT FREQUENCY: 2x/week  PT DURATION: 6 weeks  PLANNED INTERVENTIONS: Therapeutic exercises, Therapeutic activity, Neuromuscular re-education, Balance training, Gait training, Patient/Family education, Self Care, and Joint mobilization.  Trigger point dry needling/ Modalities.    PLAN FOR NEXT SESSION: Reassess HEP/ use of home TENS unit for pain mgmt.   Kayode Petion B. Rogers Blocker, SPT Pura Spice, PT, DPT # [phone] 05/28/2022, 6:28 PM

## 2022-06-01 ENCOUNTER — Ambulatory Visit (INDEPENDENT_AMBULATORY_CARE_PROVIDER_SITE_OTHER): Payer: No Typology Code available for payment source | Admitting: Neurosurgery

## 2022-06-01 DIAGNOSIS — M47817 Spondylosis without myelopathy or radiculopathy, lumbosacral region: Secondary | ICD-10-CM | POA: Diagnosis not present

## 2022-06-01 NOTE — Progress Notes (Signed)
Neurosurgery Telephone (Audio-Only) Note  Requesting Provider     Juline Patch, MD [address] [place],  [place] [address] T: [phone] F: [phone]  Primary Care Provider Juline Patch, MD [address] [place] [address] T: [phone] F: [phone]  Telehealth visit was conducted with Margaret Cuevas, a 47 y.o. female via telephone. Pt understands the limitations of a telephone visit  History of Present Illness: Margaret Cuevas is a 47 y.o with a history of right sided lumbar radiculopathy presenting today via telephone visit to review her response to PT and discuss her MRI results. She reports no significant change in her symptoms. No new or worsening symptoms.  04/28/22 Margaret Cuevas is a 47 y.o with a history of anxiety, fibromyalgia who is here today with a chief complaint of back pain. She was seen in the ER on 03/25/22 for this. At the time she complained of back pain more severe than usual with strong point tenderness along the midline of her lumbar spine without an recent inciting event. At the time she was taking gabapentin, Flexeril, and Tylenol. At lumbar CT scan was done which was without acute fracture but did show signs concerning for broad disc protrusions at L4-5 and L5-S1. Today she reports a 3 month history of increased low back pain although she endorses about 1 year of vaginal and groin pain.  She describes pain that starts in the middle of her back and radiates to her tailbone and into her bilateral hips worse on the right and some pain into her posterior thighs. She states that she will occasionally have severe sharp pains in her third toe on her right foot.  She will also occasionally have radiating pain down her right leg and this occurs typically when she feels as though her back is "locking up".  Her symptoms seem to do better with standing and leaning forward and worse with sitting.  She denies any weakness in her lower  extremities or bowel or bladder incontinence. She underwent PT at [facility] for vaginal pain without any significant improvement. She states she was told symptoms are likely coming from her back and was given back exercises however she did not undergo any formal physical therapy for her low back. Note, she reports smoking tobacco socially.   Past Surgery: Lumbar discectomy in her teens   Margaret Cuevas has no symptoms of cervical myelopathy.   The symptoms are causing a significant impact on the patient's life.   General Review of Systems:  A ROS was performed including pertinent positive and negatives as documented.  All other systems are negative.  '@ALLERGYCOLLAPSE'$ @   Prior to Admission medications   Medication Sig Start Date End Date Taking? Authorizing Provider  busPIRone (BUSPAR) 7.5 MG tablet Take 1 tablet (7.5 mg total) by mouth 2 (two) times daily. 05/13/22   Juline Patch, MD  celecoxib (CELEBREX) 100 MG capsule Take 1 capsule by mouth 2 (two) times daily. Rheum 01/15/21   [provider]  cetirizine (ZYRTEC) 10 MG tablet Take 1 tablet (10 mg total) by mouth daily. 05/13/22   Juline Patch, MD  cyclobenzaprine (FLEXERIL) 10 MG tablet TAKE 1 TABLET(10 MG) BY MOUTH AT BEDTIME 06/20/21   Juline Patch, MD  DULoxetine (CYMBALTA) 60 MG capsule Take 60 mg by mouth. Once or twice a day    [provider]  estradiol (VIVELLE-DOT) 0.1 MG/24HR patch Place 1 patch (0.1 mg total) onto the skin 2 (two)  times a week. Dr Ouida Sills 01/02/21   Juline Patch, MD  gabapentin (NEURONTIN) 100 MG capsule Take 100 mg by mouth 3 (three) times daily. Rheum 01/15/21   [provider]  gabapentin (NEURONTIN) 100 MG capsule Take 1 capsule (100 mg total) by mouth at bedtime. 04/28/22   Loleta Dicker, PA  nortriptyline (PAMELOR) 10 MG capsule neurology 07/02/21   [provider]    DATA REVIEWED    Imaging Studies  MRI L spine 05/14/22 FINDINGS: Segmentation:   Standard.   Alignment:  Physiologic.   Vertebrae:  No fracture, evidence of discitis, or bone lesion.   Conus medullaris and cauda equina: Conus extends to the L1-L2 level. Conus and cauda equina appear normal.   Paraspinal and other soft tissues: Negative.   Disc levels:   T12-L1:  Negative.   L1-L2:  Negative.   L2-L3:  Minimal disc bulging.  No stenosis.   L3-L4:  Mild disc bulging.  No stenosis.   L4-L5: Mild disc bulging with unchanged superimposed large right foraminal and far lateral disc protrusion contacting and displacing the exiting right L4 nerve root. Mild right neuroforaminal stenosis. No spinal canal or left neuroforaminal stenosis.   L5-S1: Unchanged circumferential disc osteophyte complex eccentric to the right. Unchanged mild bilateral facet arthropathy. Unchanged mild-to-moderate bilateral neuroforaminal stenosis. No spinal canal stenosis.   IMPRESSION: 1. Unchanged large right foraminal and far lateral disc protrusion at L4-L5 contacting and displacing the exiting right L4 nerve root. 2. 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     Electronically Signed   By: Titus Dubin M.D.   On: 05/15/2022 10:24    IMPRESSION  Margaret Cuevas is a 47 y.o. female who I performed a telephone encounter today for evaluation and management of lumbar radiculopathy   PLAN  Margaret Cuevas is a pleasant 47 y.o presenting via phone visit to review PT response and MRI results. Unfortunately her symptoms have not improved. MRI shows right L4-5 far lateral disc that likely explains her symptoms. We discussed treatment options including surgery vs injections and continued conservative management. She would like to move forward with continued PT and ESI as her current work situation is not contusive to surgery and recovery. I have place a referral to Dr. Holley Raring for injection and encouraged her to continue PT. I will see her back via telephone visit in 6-8  weeks to review her response to [facility] and completed PT. She was encouraged to call the office in the interim with any questions or concerns.  Orders Placed This Encounter  Procedures   Ambulatory referral to Pain Clinic   DISPOSITION  Follow up: In person appointment in 6 weeks  Loleta Dicker, PA   TELEPHONE DOCUMENTATION   This visit was performed via telephone.  Patient location: home Provider location: office  I spent a total of 5 minutes non-face-to-face activities for this visit on the date of this encounter including review of current clinical condition and response to treatment.  The patient is aware of and accepts the limits of this telehealth visit.

## 2022-06-02 ENCOUNTER — Ambulatory Visit: Payer: No Typology Code available for payment source | Admitting: Physical Therapy

## 2022-06-02 ENCOUNTER — Telehealth: Payer: No Typology Code available for payment source | Admitting: Neurosurgery

## 2022-06-02 DIAGNOSIS — M6281 Muscle weakness (generalized): Secondary | ICD-10-CM

## 2022-06-02 DIAGNOSIS — M5459 Other low back pain: Secondary | ICD-10-CM | POA: Diagnosis not present

## 2022-06-02 NOTE — Therapy (Unsigned)
OUTPATIENT PHYSICAL THERAPY THORACOLUMBAR TREATMENT   Patient Name: Margaret Cuevas MRN: [id number] DOB:November 07, 1975, 47 y.o., female Today's Date: 06/02/2022  END OF SESSION:  PT End of Session - 06/02/22 1735     Visit Number 4    Number of Visits 12    Date for PT Re-Evaluation 07/02/22    PT Start Time 6834    PT Stop Time 1819    PT Time Calculation (min) 44 min    Equipment Utilized During Treatment Other (comment)    Activity Tolerance Patient tolerated treatment well    Behavior During Therapy WFL for tasks assessed/performed            Past Medical History:  Diagnosis Date   Anxiety    Fibromyalgia    Past Surgical History:  Procedure Laterality Date   ABDOMINAL HYSTERECTOMY     ANKLE FRACTURE SURGERY     plates and screws placed on both sides   APPENDECTOMY     BACK SURGERY     CESAREAN SECTION     CHOLECYSTECTOMY     COLONOSCOPY N/A 05/04/2022   Procedure: COLONOSCOPY;  Surgeon: [name], DO;  Location: [facility];  Service: Gastroenterology;  Laterality: N/A;   EAR TUBE REMOVAL     ESOPHAGOGASTRODUODENOSCOPY N/A 05/04/2022   Procedure: ESOPHAGOGASTRODUODENOSCOPY (EGD);  Surgeon: [name], DO;  Location: [facility] ENDOSCOPY;  Service: Gastroenterology;  Laterality: N/A;   GASTRIC BYPASS  2006   ORIF FINGER / THUMB FRACTURE     screws in thumb   Patient Active Problem List   Diagnosis Date Noted   H/O total hysterectomy 07/15/2020   History of appendectomy 07/15/2020   Lipoma of back 11/15/2018   Fibromyalgia 10/25/2017   Influenza vaccine needed 01/20/2017    PCP: Juline Patch, MD  REFERRING PROVIDER: Loleta Dicker, PA  REFERRING DIAG: [phone] (ICD-10-CM) - Lumbosacral spondylosis without myelopathy   Rationale for Evaluation and Treatment: Rehabilitation  THERAPY DIAG:  Other low back pain Muscle weakness (generalized)  ONSET DATE: >3 months  SUBJECTIVE:                                                                                                                                                                                            SUBJECTIVE STATEMENT:   EVALUATION Pt. Reports back "locks up" and states pain is severe (10/10 at worst).  Pt. Unaware of movement that is causing pain.  Pt. States pain "feels like someone is hitting me with an axe in back".  Pt. Reports R LE radicular symptoms to calf.    PERTINENT HISTORY:  History of Present Illness: 04/28/2022  Margaret Cuevas is a 47 y.o with a history of anxiety, fibromyalgia who is here today with a chief complaint of back pain. She was seen in the ER on 03/25/22 for this. At the time she complained of back pain more severe than usual with strong point tenderness along the midline of her lumbar spine without an recent inciting event. At the time she was taking gabapentin, Flexeril, and Tylenol. At lumbar CT scan was done which was without acute fracture but did show signs concerning for broad disc protrusions at L4-5 and L5-S1. Today she reports a 3 month history of increased low back pain although she endorses about 1 year of vaginal and groin pain.  She describes pain that starts in the middle of her back and radiates to her tailbone and into her bilateral hips worse on the right and some pain into her posterior thighs. She states that she will occasionally have severe sharp pains in her third toe on her right foot.  She will also occasionally have radiating pain down her right leg and this occurs typically when she feels as though her back is "locking up".  Her symptoms seem to do better with standing and leaning forward and worse with sitting.  She denies any weakness in her lower extremities or bowel or bladder incontinence. She underwent PT at [facility] for vaginal pain without any significant improvement. She states she was told symptoms are likely coming from her back and was given back exercises however she did not undergo any formal physical therapy  for her low back. Note, she reports smoking tobacco socially.   Past Surgery: Lumbar discectomy in her teens   Margaret Cuevas has no symptoms of cervical myelopathy.   The symptoms are causing a significant impact on the patient's life.  NEUROLOGICAL:     Awake, alert, oriented to person, place, and time.  Speech is clear and fluent. Fund of knowledge is appropriate.    Cranial Nerves: Pupils equal round and reactive to light.  Facial tone is symmetric.  Facial sensation is symmetric.  ROM of spine: limited due to pain.  Palpation of spine: Diffuse point tenderness throughout her lumbosacral spine with worsening over her right SI joint.   Strength: Side Biceps Triceps Deltoid Interossei Grip Wrist Ext. Wrist Flex.  R '5 5 5 5 5 5 5  '$ L '5 5 5 5 5 5 5    '$ Side Iliopsoas Quads Hamstring PF DF EHL  R '5 5 5 5 5 5  '$ L '5 5 5 5 5 5    '$ Reflexes are 1+ and symmetric at the biceps, triceps, brachioradialis, patella and achilles.   Hoffman's is absent.  Clonus is not present.  Toes are down-going.  Bilateral upper and lower extremity sensation is intact to light touch.    Ambulates with a mildly antalgic gait.  Assessment and Plan: [name] is a pleasant 47 y.o. female with longstanding history of lumbosacral complaints worse over the last 3 months with associated radiculopathy worse on the right than the left.  Given her CT results and feeling that her symptoms, I like to evaluate her further with a lumbar MRI.  I have also placed a referral to physical therapy which she would like to do through, and rehab and [place] as she has been there in the past.  I placed a referral for this.  In addition to this I have increased her gabapentin.  She is currently prescribed 100 mg 3 times daily however due  to medication side effects of drowsiness, she takes all 3 tablets at night.  I we will increase her dose to 400 mg nightly.   I will see her back in 4 to 6 weeks via telephone visit to review her MRI results  and discuss her progress with physical therapy. She was encouraged to call the office in the interim should she have any questions or concerns.  She expressed understanding and was in agreement with this plan.  PAIN:  Are you having pain? Yes: NPRS scale: 5/10 Pain location: low back/ R SI/ hip Pain description: "like a vice" Aggravating factors: certain movements/ stairs Relieving factors: medications  PRECAUTIONS: None  WEIGHT BEARING RESTRICTIONS: No  FALLS:  Has patient fallen in last 6 months? No  LIVING ENVIRONMENT: Lives with: lives with their family Lives in: House/apartment Stairs: entry stairs.  Difficulty due to back pain.   Has following equipment at home: None  OCCUPATION: Seated job at [employer].  Has a standing/ adjustable desk.    PLOF: Independent  PATIENT GOALS: Decrease low back pain/ radicular symptoms.    NEXT MD VISIT: 06/02/22 with Neurosurgery office.    OBJECTIVE:   DIAGNOSTIC FINDINGS:  CLINICAL DATA:  Chronic, progressively worsening low back pain radiating into the right hip. No injury or prior surgery.   EXAM: MRI LUMBAR SPINE WITHOUT CONTRAST   TECHNIQUE: Multiplanar, multisequence MR imaging of the lumbar spine was performed. No intravenous contrast was administered.   COMPARISON:  CT lumbar spine dated March 25, 2022.   FINDINGS: Segmentation:  Standard.   Alignment:  Physiologic.   Vertebrae:  No fracture, evidence of discitis, or bone lesion.   Conus medullaris and cauda equina: Conus extends to the L1-L2 level. Conus and cauda equina appear normal.   Paraspinal and other soft tissues: Negative.   Disc levels:   T12-L1:  Negative.   L1-L2:  Negative.   L2-L3:  Minimal disc bulging.  No stenosis.   L3-L4:  Mild disc bulging.  No stenosis.   L4-L5: Mild disc bulging with unchanged superimposed large right foraminal and far lateral disc protrusion contacting and displacing the exiting right L4 nerve root. Mild right  neuroforaminal stenosis. No spinal canal or left neuroforaminal stenosis.   L5-S1: Unchanged circumferential disc osteophyte complex eccentric to the right. Unchanged mild bilateral facet arthropathy. Unchanged mild-to-moderate bilateral neuroforaminal stenosis. No spinal canal stenosis.   IMPRESSION: 1. Unchanged large right foraminal and far lateral disc protrusion at L4-L5 contacting and displacing the exiting right L4 nerve root. 2. 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   Electronically Signed   By: Titus Dubin M.D.   On: 05/15/2022 10:24  PATIENT SURVEYS:  FOTO initial 42/ goal 58  SCREENING FOR RED FLAGS: Bowel or bladder incontinence: Yes:   Spinal tumors: No Cauda equina syndrome: No Compression fracture: No Abdominal aneurysm: No  COGNITION: Overall cognitive status: Within functional limits for tasks assessed     SENSATION: TBD  MUSCLE LENGTH: Hamstrings: Right 17 deg (pain limited); Left 30 deg Thomas test: N/T at this time (pain).    POSTURE: rounded shoulders and forward head  PALPATION: (+) R superior glut and L4-5 tenderness  LUMBAR ROM:   AROM eval  Flexion 55 deg. (No radicular symptoms).  Extension 15 deg.  Right lateral flexion 50% limited  Left lateral flexion 25% limited  Right rotation 75% limited  Left rotation Unable (shooting pain reported).     (Blank rows = not tested)  Pt. Sleeps on R  side at night.  Pt. Occasionally uses pillow between knees.    LOWER EXTREMITY ROM:       Testing limited due to increase in R LE symptoms/ exacerbation of R lower leg/ foot radicular symptoms "pins and needles".    Active  Right eval Left eval  Hip flexion Very limited due to pain 90 deg. (Pain)  Hip extension    Hip abduction    Hip adduction    Hip internal rotation 31 deg. 34 deg.  Hip external rotation 24 deg. 24 deg.  Knee flexion    Knee extension    Ankle dorsiflexion    Ankle plantarflexion     Ankle inversion    Ankle eversion     (Blank rows = not tested)  LOWER EXTREMITY MMT:    MMT Right eval Left eval  Hip flexion 4/5 4/5  Hip extension    Hip abduction    Hip adduction    Hip internal rotation 4/5 4/5  Hip external rotation 4/5 4/5  Knee flexion 4/5 5/5  Knee extension 5/5 5/5  Ankle dorsiflexion    Ankle plantarflexion    Ankle inversion    Ankle eversion     (Blank rows = not tested)  LUMBAR SPECIAL TESTS:  Straight leg raise test: Positive (R LE positive)  FUNCTIONAL TESTS:  TBD  GAIT: Distance walked: In clinic Assistive device utilized: None Level of assistance: Complete Independence Comments: R antalgic gait noted.    TODAY'S TREATMENT:                                                                                                                              DATE: 06/02/22   Subjective: Pt. Arrived to PT clinic reporting 4.5/10 LBP on NPS. Pt. Reports no radicular sx. At this time. Pt. States she noticed a decrease in sharp pain with use of the TENS unit but no decrease in overall pain. Pt. States pain has been worse with sitting at work this week. Pt. States neurosurgeon called and stated there was severe bulging of the discs and to be careful with lifting. Neurosurgereon suggested getting pressure off L4-L5 level with PT and to follow up in 6 weeks. Neurosurgeon also mentioned shots into the lumbar spine to help reduce sx. so pt. can continue PT.   TENS unit:   EMPI Continuum IFC to low back (preset protocol) at 22 mA during tx./ there.ex.  Pt. Understands how to set-up/ manage TENS unit for home use.  Pt. Will continue to use unit at home for pain mgmt.    There Ex.: ( w/ TENS unit applied @ 74m) -Supine frwd./bkwd. pelvic tilts with TrA contraction. 2x10 each direction  -Supine green banded hip abduction with TrA contraction 2x15 reps.   -B alternating supine marches with TrA contraction 2x10  - started with green band and went to no band  due to sharp increase in pain.   -Seated blue ball pelvic clocks(alternating  clockwise and counterclockwise) with TrA contraction in front of mirror for visual feedback on technique. 30 sec each direction x 2 sets.   -Seated blue ball alternating leg marches with TrA contraction in front of mirror for visual feedback on technique. 2x10 each leg.   PATIENT EDUCATION:  Education details: Access Code: [email or web address] Person educated: Patient Education method: Explanation, Demonstration, and Handouts Education comprehension: verbalized understanding and returned demonstration  HOME EXERCISE PROGRAM: Access Code: [email or web address] URL: [email or web address] Date: 05/21/2022 Prepared by: Dorcas Carrow  Exercises - Lower Trunk Rotations  - 1 x daily - 7 x weekly - 3 sets - 10 reps - Hooklying Single Knee to Chest Stretch  - 1 x daily - 7 x weekly - 3 sets - 10 reps - Seated Pelvic Tilt  - 1 x daily - 7 x weekly - 3 sets - 10 reps - Seated Diaphragmatic Breathing  - 1 x daily - 7 x weekly - 3 sets - 10 reps  ASSESSMENT:  CLINICAL IMPRESSION: Pt. Arrived to PT reporting a major decrease in back pain relative to the previous visit. Pt. Reported 4/10 LBP on NPS without any radicular sx in R LE. Pt. Had a mild increase in LBP to 5/10 NPS during there ex. Activity during today's treatment. Pt. Required rest breaks between each set to help calm her sx. Back to baseline before continuing with there ex. Pt. Educated on the importance of regularly contracting TrA in order to relieve some of the pressure off of her lumbar spine during both sitting and standing activities. Pt. Used the TENS unit with two channels applied in an IFC pattern at intensity of 40m to help decrease pain and promote there ex. Activity/treatment. Pt. Was able to perform more there ex. Activity than she has been able to in the past due to having a more controlled pain level during today's treatment. Pt. Will continue to benefit from  skilled PT treatment to decrease pain and improve functional mobility and strength of the lumbar spine.   OBJECTIVE IMPAIRMENTS: Abnormal gait, decreased activity tolerance, decreased endurance, decreased mobility, difficulty walking, decreased ROM, decreased strength, hypomobility, impaired flexibility, improper body mechanics, postural dysfunction, obesity, and pain.   ACTIVITY LIMITATIONS: lifting, bending, standing, squatting, stairs, and locomotion level  PARTICIPATION LIMITATIONS: cleaning, driving, community activity, and occupation  PERSONAL FACTORS: Fitness and Past/current experiences are also affecting patient's functional outcome.   REHAB POTENTIAL: Good  CLINICAL DECISION MAKING: Evolving/moderate complexity  EVALUATION COMPLEXITY: Moderate   GOALS: Goals reviewed with patient? Yes  SHORT TERM GOALS: Target date: 06/11/22  Pt. Independent with HEP to [facility] to improve pain-free mobility.   Baseline:  see above Goal status: INITIAL   LONG TERM GOALS: Target date: 07/02/22  Pt. Will increase FOTO to 58 to improve pain-free mobility.   Baseline: initial 42 Goal status: INITIAL  2.  Pt. Will increase B LE muscle strength 1/2 muscle grade to improve standing tolerance/ daily mobility at home/work.   Baseline:  see above Goal status: INITIAL  3.  Pt. Will report 50% improvement in overall low back pain with no radicular symptoms to improve functional mobility.  Baseline: 10/10 low back pain during evaluation with radicular symptoms.  Goal status: INITIAL   PLAN:  PT FREQUENCY: 2x/week  PT DURATION: 6 weeks  PLANNED INTERVENTIONS: Therapeutic exercises, Therapeutic activity, Neuromuscular re-education, Balance training, Gait training, Patient/Family education, Self Care, and Joint mobilization.  Trigger point dry needling/ Modalities.    PLAN FOR  NEXT SESSION: Reassess HEP/ progress strengthening exercises if pain permits.     Hollan Philipp B. Rogers Blocker, SPT Pura Spice, PT, DPT # [phone] 06/02/2022, 6:40 PM

## 2022-06-04 ENCOUNTER — Ambulatory Visit: Payer: No Typology Code available for payment source | Admitting: Physical Therapy

## 2022-06-04 DIAGNOSIS — M6281 Muscle weakness (generalized): Secondary | ICD-10-CM

## 2022-06-04 DIAGNOSIS — M5459 Other low back pain: Secondary | ICD-10-CM | POA: Diagnosis not present

## 2022-06-04 NOTE — Therapy (Signed)
OUTPATIENT PHYSICAL THERAPY THORACOLUMBAR TREATMENT   Patient Name: Margaret Cuevas MRN: [id number] DOB:[birth year], 47 y.o., female Today's Date: 06/04/2022  END OF SESSION:  PT End of Session - 06/04/22 1730     Visit Number 5    Number of Visits 12    Date for PT Re-Evaluation 07/02/22    PT Start Time [id number]    PT Stop Time [id number]    PT Time Calculation (min) 52 min    Equipment Utilized During Treatment Other (comment)    Activity Tolerance Patient tolerated treatment well;Patient limited by pain    Behavior During Therapy WFL for tasks assessed/performed            Past Medical History:  Diagnosis Date   Anxiety    Fibromyalgia    Past Surgical History:  Procedure Laterality Date   ABDOMINAL HYSTERECTOMY     ANKLE FRACTURE SURGERY     plates and screws placed on both sides   APPENDECTOMY     BACK [place] N/A 05/04/2022   Procedure: COLONOSCOPY;  Surgeon: [name], DO;  Location: [facility] ENDOSCOPY;  Service: Gastroenterology;  Laterality: N/A;   EAR TUBE REMOVAL     ESOPHAGOGASTRODUODENOSCOPY N/A 05/04/2022   Procedure: ESOPHAGOGASTRODUODENOSCOPY (EGD);  Surgeon: [name], DO;  Location: [facility] ENDOSCOPY;  Service: Gastroenterology;  Laterality: N/A;   GASTRIC BYPASS  2006   ORIF FINGER / THUMB FRACTURE     screws in thumb   Patient Active Problem List   Diagnosis Date Noted   H/O total hysterectomy 07/15/2020   History of appendectomy 07/15/2020   Lipoma of back 11/15/2018   Fibromyalgia 10/25/2017   Influenza vaccine needed 01/20/2017    PCP: Juline Patch, MD  REFERRING PROVIDER: Loleta Dicker, PA  REFERRING DIAG: [phone] (ICD-10-CM) - Lumbosacral spondylosis without myelopathy   Rationale for Evaluation and Treatment: Rehabilitation  THERAPY DIAG:  Other low back pain Muscle weakness (generalized)  ONSET DATE: >3 months  SUBJECTIVE:                                                                                                                                                                                            SUBJECTIVE STATEMENT:   EVALUATION Pt. Reports back "locks up" and states pain is severe (10/10 at worst).  Pt. Unaware of movement that is causing pain.  Pt. States pain "feels like someone is hitting me with an axe in back".  Pt. Reports R LE radicular symptoms to calf.    PERTINENT HISTORY:  History of  Present Illness: 04/28/2022 Margaret Cuevas is a 47 y.o with a history of anxiety, fibromyalgia who is here today with a chief complaint of back pain. She was seen in the ER on 03/25/22 for this. At the time she complained of back pain more severe than usual with strong point tenderness along the midline of her lumbar spine without an recent inciting event. At the time she was taking gabapentin, Flexeril, and Tylenol. At lumbar CT scan was done which was without acute fracture but did show signs concerning for broad disc protrusions at L4-5 and L5-S1. Today she reports a 3 month history of increased low back pain although she endorses about 1 year of vaginal and groin pain.  She describes pain that starts in the middle of her back and radiates to her tailbone and into her bilateral hips worse on the right and some pain into her posterior thighs. She states that she will occasionally have severe sharp pains in her third toe on her right foot.  She will also occasionally have radiating pain down her right leg and this occurs typically when she feels as though her back is "locking up".  Her symptoms seem to do better with standing and leaning forward and worse with sitting.  She denies any weakness in her lower extremities or bowel or bladder incontinence. She underwent PT at [facility] for vaginal pain without any significant improvement. She states she was told symptoms are likely coming from her back and was given back exercises however she did not undergo any  formal physical therapy for her low back. Note, she reports smoking tobacco socially.   Past Surgery: Lumbar discectomy in her teens   Morley Alegre has no symptoms of cervical myelopathy.   The symptoms are causing a significant impact on the patient's life.  NEUROLOGICAL:     Awake, alert, oriented to person, place, and time.  Speech is clear and fluent. Fund of knowledge is appropriate.    Cranial Nerves: Pupils equal round and reactive to light.  Facial tone is symmetric.  Facial sensation is symmetric.  ROM of spine: limited due to pain.  Palpation of spine: Diffuse point tenderness throughout her lumbosacral spine with worsening over her right SI joint.   Strength: Side Biceps Triceps Deltoid Interossei Grip Wrist Ext. Wrist Flex.  R 5 5 5 5 5 5 5  $ L 5 5 5 5 5 5 5    $ Side Iliopsoas Quads Hamstring PF DF EHL  R 5 5 5 5 5 5  $ L 5 5 5 5 5 5    $ Reflexes are 1+ and symmetric at the biceps, triceps, brachioradialis, patella and achilles.   Hoffman's is absent.  Clonus is not present.  Toes are down-going.  Bilateral upper and lower extremity sensation is intact to light touch.    Ambulates with a mildly antalgic gait.  Assessment and Plan: Margaret Cuevas is a pleasant 47 y.o female with longstanding history of lumbosacral complaints worse over the last 3 months with associated radiculopathy worse on the right than the left.  Given her CT results and feeling that her symptoms, I like to evaluate her further with a lumbar MRI.  I have also placed a referral to physical therapy which she would like to do through, and rehab and [place] as she has been there in the past.  I placed a referral for this.  In addition to this I have increased her gabapentin.  She is currently prescribed 100 mg 3 times  daily however due to medication side effects of drowsiness, she takes all 3 tablets at night.  I we will increase her dose to 400 mg nightly.   I will see her back in 4 to 6 weeks via telephone visit to  review her MRI results and discuss her progress with physical therapy. She was encouraged to call the office in the interim should she have any questions or concerns.  She expressed understanding and was in agreement with this plan.  PAIN:  Are you having pain? Yes: NPRS scale: 5/10 Pain location: low back/ R SI/ hip Pain description: "like a vice" Aggravating factors: certain movements/ stairs Relieving factors: medications  PRECAUTIONS: None  WEIGHT BEARING RESTRICTIONS: No  FALLS:  Has patient fallen in last 6 months? No  LIVING ENVIRONMENT: Lives with: lives with their family Lives in: House/apartment Stairs: entry stairs.  Difficulty due to back pain.   Has following equipment at home: None  OCCUPATION: Seated job at [employer].  Has a standing/ adjustable desk.    PLOF: Independent  PATIENT GOALS: Decrease low back pain/ radicular symptoms.    NEXT MD VISIT: 06/02/22 with Neurosurgery office.    OBJECTIVE:   DIAGNOSTIC FINDINGS:  CLINICAL DATA:  Chronic, progressively worsening low back pain radiating into the right hip. No injury or prior surgery.   EXAM: MRI LUMBAR SPINE WITHOUT CONTRAST   TECHNIQUE: Multiplanar, multisequence MR imaging of the lumbar spine was performed. No intravenous contrast was administered.   COMPARISON:  CT lumbar spine dated March 25, 2022.   FINDINGS: Segmentation:  Standard.   Alignment:  Physiologic.   Vertebrae:  No fracture, evidence of discitis, or bone lesion.   Conus medullaris and cauda equina: Conus extends to the L1-L2 level. Conus and cauda equina appear normal.   Paraspinal and other soft tissues: Negative.   Disc levels:   T12-L1:  Negative.   L1-L2:  Negative.   L2-L3:  Minimal disc bulging.  No stenosis.   L3-L4:  Mild disc bulging.  No stenosis.   L4-L5: Mild disc bulging with unchanged superimposed large right foraminal and far lateral disc protrusion contacting and displacing the exiting right L4 nerve  root. Mild right neuroforaminal stenosis. No spinal canal or left neuroforaminal stenosis.   L5-S1: Unchanged circumferential disc osteophyte complex eccentric to the right. Unchanged mild bilateral facet arthropathy. Unchanged mild-to-moderate bilateral neuroforaminal stenosis. No spinal canal stenosis.   IMPRESSION: 1. Unchanged large right foraminal and far lateral disc protrusion at L4-L5 contacting and displacing the exiting right L4 nerve root. 2. 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   Electronically Signed   By: Titus Dubin M.D.   On: 05/15/2022 10:24  PATIENT SURVEYS:  FOTO initial 42/ goal 58  SCREENING FOR RED FLAGS: Bowel or bladder incontinence: Yes:   Spinal tumors: No Cauda equina syndrome: No Compression fracture: No Abdominal aneurysm: No  COGNITION: Overall cognitive status: Within functional limits for tasks assessed     SENSATION: TBD  MUSCLE LENGTH: Hamstrings: Right 17 deg (pain limited); Left 30 deg Thomas test: N/T at this time (pain).    POSTURE: rounded shoulders and forward head  PALPATION: (+) R superior glut and L4-5 tenderness  LUMBAR ROM:   AROM eval  Flexion 55 deg. (No radicular symptoms).  Extension 15 deg.  Right lateral flexion 50% limited  Left lateral flexion 25% limited  Right rotation 75% limited  Left rotation Unable (shooting pain reported).     (Blank rows = not tested)  Pt.  Sleeps on R side at night.  Pt. Occasionally uses pillow between knees.    LOWER EXTREMITY ROM:       Testing limited due to increase in R LE symptoms/ exacerbation of R lower leg/ foot radicular symptoms "pins and needles".    Active  Right eval Left eval  Hip flexion Very limited due to pain 90 deg. (Pain)  Hip extension    Hip abduction    Hip adduction    Hip internal rotation 31 deg. 34 deg.  Hip external rotation 24 deg. 24 deg.  Knee flexion    Knee extension    Ankle dorsiflexion    Ankle  plantarflexion    Ankle inversion    Ankle eversion     (Blank rows = not tested)  LOWER EXTREMITY MMT:    MMT Right eval Left eval  Hip flexion 4/5 4/5  Hip extension    Hip abduction    Hip adduction    Hip internal rotation 4/5 4/5  Hip external rotation 4/5 4/5  Knee flexion 4/5 5/5  Knee extension 5/5 5/5  Ankle dorsiflexion    Ankle plantarflexion    Ankle inversion    Ankle eversion     (Blank rows = not tested)  LUMBAR SPECIAL TESTS:  Straight leg raise test: Positive (R LE positive)  FUNCTIONAL TESTS:  TBD  GAIT: Distance walked: In clinic Assistive device utilized: None Level of assistance: Complete Independence Comments: R antalgic gait noted.    TODAY'S TREATMENT:                                                                                                                              DATE: 06/04/22   Subjective: Pt. Arrived to PT clinic reporting 5/10 LBP on NPS. Pt. Reports 8/10 pain on NPS after the previous PT treatment. Pt. Reports radicular sx. Into her buttock and hips over the last few days. Pt. states radicular sx. Are sometimes in both the R and L LE. Pt. Reports no radicular sx. At this time. Pt. States she noticed a decrease in sharp pain with use of the TENS unit but no decrease in overall pain.   TENS unit:  EMPI Continuum IFC to low back (preset protocol) at 15 mA during tx./ there.ex.  Pt. Understands how to set-up/ manage TENS unit for home use.  Pt. Will continue to use unit at home for pain mgmt.    There Ex.: ( w/ TENS unit applied @ 28m) -Supine frwd./bkwd. pelvic tilts with TrA contraction. 2x10 each direction  -Supine hip abduction with TrA contraction 2x15 reps.   -See updated HEP  Manual Tx: -B supine nerve glides 2x10 each side. Pt.s Radicular Sx. Were reproduced   PATIENT EDUCATION:  Education details: Access Code: [email or web address] educated: Patient Education method: Explanation, Demonstration, and Handouts Education  comprehension: verbalized understanding and returned demonstration  HOME EXERCISE PROGRAM: Access Code: [email or web address]: [email or web address] Date: 05/21/2022 Prepared by: MLegrand Como  Sherk  Exercises - Lower Trunk Rotations  - 1 x daily - 7 x weekly - 3 sets - 10 reps - Hooklying Single Knee to Chest Stretch  - 1 x daily - 7 x weekly - 3 sets - 10 reps - Seated Pelvic Tilt  - 1 x daily - 7 x weekly - 3 sets - 10 reps - Seated Diaphragmatic Breathing  - 1 x daily - 7 x weekly - 3 sets - 10 reps  ASSESSMENT:  CLINICAL IMPRESSION: Pt. Arrived to PT reporting 5/10 back pain. Pt. has had an increase in radicular sx in L LE. Pt. Had a severe increase in LBP after the last treatment that has began to move up into her thoracic spine. Pt. Required extensive rest breaks between each set to help calm her sx. back to baseline before continuing with there ex. Pt. Educated on the importance of regularly contracting TrA in order to relieve some of the pressure off of her lumbar spine during both sitting and standing activities. Pt. Used the TENS unit with two channels applied in an IFC pattern at intensity of 28m to help decrease pain and promote there ex. Activity/treatment. PT had to decrease the intensity/amount of there ex. Due to pt. Being severely limited by pain and due to exacerbation of sx. After last visit. PT added sitting nerve glides to pt.'s HEP to try and reduce radicular sx. Pt. Will continue to benefit from skilled PT treatment to decrease pain and improve functional mobility and strength of the lumbar spine.   OBJECTIVE IMPAIRMENTS: Abnormal gait, decreased activity tolerance, decreased endurance, decreased mobility, difficulty walking, decreased ROM, decreased strength, hypomobility, impaired flexibility, improper body mechanics, postural dysfunction, obesity, and pain.   ACTIVITY LIMITATIONS: lifting, bending, standing, squatting, stairs, and locomotion level  PARTICIPATION  LIMITATIONS: cleaning, driving, community activity, and occupation  PERSONAL FACTORS: Fitness and Past/current experiences are also affecting patient's functional outcome.   REHAB POTENTIAL: Good  CLINICAL DECISION MAKING: Evolving/moderate complexity  EVALUATION COMPLEXITY: Moderate   GOALS: Goals reviewed with patient? Yes  SHORT TERM GOALS: Target date: 06/11/22  Pt. Independent with HEP to increase lumbar AROM to [facility](all planes) to improve pain-free mobility.   Baseline:  see above Goal status: INITIAL   LONG TERM GOALS: Target date: 07/02/22  Pt. Will increase FOTO to 58 to improve pain-free mobility.   Baseline: initial 42 Goal status: INITIAL  2.  Pt. Will increase B LE muscle strength 1/2 muscle grade to improve standing tolerance/ daily mobility at home/work.   Baseline:  see above Goal status: INITIAL  3.  Pt. Will report 50% improvement in overall low back pain with no radicular symptoms to improve functional mobility.  Baseline: 10/10 low back pain during evaluation with radicular symptoms.  Goal status: INITIAL   PLAN:  PT FREQUENCY: 2x/week  PT DURATION: 6 weeks  PLANNED INTERVENTIONS: Therapeutic exercises, Therapeutic activity, Neuromuscular re-education, Balance training, Gait training, Patient/Family education, Self Care, and Joint mobilization.  Trigger point dry needling/ Modalities.    PLAN FOR NEXT SESSION: Reassess HEP/ progress strengthening exercises if pain permits.    Cortlyn Cannell B. WRogers Blocker SPT MPura Spice PT, DPT # [phone]/11/2022, 6:23 PM

## 2022-06-09 ENCOUNTER — Ambulatory Visit: Payer: No Typology Code available for payment source | Admitting: Physical Therapy

## 2022-06-09 ENCOUNTER — Other Ambulatory Visit: Payer: Self-pay | Admitting: Neurosurgery

## 2022-06-09 DIAGNOSIS — M6281 Muscle weakness (generalized): Secondary | ICD-10-CM

## 2022-06-09 DIAGNOSIS — M5459 Other low back pain: Secondary | ICD-10-CM

## 2022-06-09 NOTE — Therapy (Unsigned)
OUTPATIENT PHYSICAL THERAPY THORACOLUMBAR TREATMENT   Patient Name: Margaret Cuevas MRN: [id number] DOB:1976-02-03, 47 y.o., female Today's Date: 06/09/2022  END OF SESSION:  PT End of Session - 06/09/22 1733     Visit Number 6    Number of Visits 12    Date for PT Re-Evaluation 07/02/22    PT Start Time [id number]    PT Stop Time [id number]    PT Time Calculation (min) 47 min    Equipment Utilized During Treatment Other (comment)    Activity Tolerance Patient tolerated treatment well;Patient limited by pain    Behavior During Therapy WFL for tasks assessed/performed            Past Medical History:  Diagnosis Date   Anxiety    Fibromyalgia    Past Surgical History:  Procedure Laterality Date   ABDOMINAL HYSTERECTOMY     ANKLE FRACTURE SURGERY     plates and screws placed on both sides   APPENDECTOMY     BACK [place] N/A 05/04/2022   Procedure: COLONOSCOPY;  Surgeon: [name], DO;  Location: [facility] ENDOSCOPY;  Service: Gastroenterology;  Laterality: N/A;   EAR TUBE REMOVAL     ESOPHAGOGASTRODUODENOSCOPY N/A 05/04/2022   Procedure: ESOPHAGOGASTRODUODENOSCOPY (EGD);  Surgeon: [name], DO;  Location: [facility] ENDOSCOPY;  Service: Gastroenterology;  Laterality: N/A;   GASTRIC BYPASS  2006   ORIF FINGER / THUMB FRACTURE     screws in thumb   Patient Active Problem List   Diagnosis Date Noted   H/O total hysterectomy 07/15/2020   History of appendectomy 07/15/2020   Lipoma of back 11/15/2018   Fibromyalgia 10/25/2017   Influenza vaccine needed 01/20/2017    PCP: Juline Patch, MD  REFERRING PROVIDER: Loleta Dicker, PA  REFERRING DIAG: [phone] (ICD-10-CM) - Lumbosacral spondylosis without myelopathy   Rationale for Evaluation and Treatment: Rehabilitation  THERAPY DIAG:  Other low back pain Muscle weakness (generalized)  ONSET DATE: >3 months  SUBJECTIVE:                                                                                                                                                                                            SUBJECTIVE STATEMENT:   EVALUATION Pt. Reports back "locks up" and states pain is severe (10/10 at worst).  Pt. Unaware of movement that is causing pain.  Pt. States pain "feels like someone is hitting me with an axe in back".  Pt. Reports R LE radicular symptoms to calf.    PERTINENT HISTORY:  History of  Present Illness: 04/28/2022 [name] is a 47 y.o with a history of anxiety, fibromyalgia who is here today with a chief complaint of back pain. She was seen in the ER on 03/25/22 for this. At the time she complained of back pain more severe than usual with strong point tenderness along the midline of her lumbar spine without an recent inciting event. At the time she was taking gabapentin, Flexeril, and Tylenol. At lumbar CT scan was done which was without acute fracture but did show signs concerning for broad disc protrusions at L4-5 and L5-S1. Today she reports a 3 month history of increased low back pain although she endorses about 1 year of vaginal and groin pain.  She describes pain that starts in the middle of her back and radiates to her tailbone and into her bilateral hips worse on the right and some pain into her posterior thighs. She states that she will occasionally have severe sharp pains in her third toe on her right foot.  She will also occasionally have radiating pain down her right leg and this occurs typically when she feels as though her back is "locking up".  Her symptoms seem to do better with standing and leaning forward and worse with sitting.  She denies any weakness in her lower extremities or bowel or bladder incontinence. She underwent PT at [facility] for vaginal pain without any significant improvement. She states she was told symptoms are likely coming from her back and was given back exercises however she did not undergo any  formal physical therapy for her low back. Note, she reports smoking tobacco socially.   Past Surgery: Lumbar discectomy in her teens   [name] has no symptoms of cervical myelopathy.   The symptoms are causing a significant impact on the patient's life.  NEUROLOGICAL:     Awake, alert, oriented to person, place, and time.  Speech is clear and fluent. Fund of knowledge is appropriate.    Cranial Nerves: Pupils equal round and reactive to light.  Facial tone is symmetric.  Facial sensation is symmetric.  ROM of spine: limited due to pain.  Palpation of spine: Diffuse point tenderness throughout her lumbosacral spine with worsening over her right SI joint.   Strength: Side Biceps Triceps Deltoid Interossei Grip Wrist Ext. Wrist Flex.  R 5 5 5 5 5 5 5  $ L 5 5 5 5 5 5 5    $ Side Iliopsoas Quads Hamstring PF DF EHL  R 5 5 5 5 5 5  $ L 5 5 5 5 5 5    $ Reflexes are 1+ and symmetric at the biceps, triceps, brachioradialis, patella and achilles.   Hoffman's is absent.  Clonus is not present.  Toes are down-going.  Bilateral upper and lower extremity sensation is intact to light touch.    Ambulates with a mildly antalgic gait.  Assessment and Plan: [name] is a pleasant 47 y.o. female with longstanding history of lumbosacral complaints worse over the last 3 months with associated radiculopathy worse on the right than the left.  Given her CT results and feeling that her symptoms, I like to evaluate her further with a lumbar MRI.  I have also placed a referral to physical therapy which she would like to do through, and rehab and [place] as she has been there in the past.  I placed a referral for this.  In addition to this I have increased her gabapentin.  She is currently prescribed 100 mg 3 times  daily however due to medication side effects of drowsiness, she takes all 3 tablets at night.  I we will increase her dose to 400 mg nightly.   I will see her back in 4 to 6 weeks via telephone visit to  review her MRI results and discuss her progress with physical therapy. She was encouraged to call the office in the interim should she have any questions or concerns.  She expressed understanding and was in agreement with this plan.  PAIN:  Are you having pain? Yes: NPRS scale: 5/10 Pain location: low back/ R SI/ hip Pain description: "like a vice" Aggravating factors: certain movements/ stairs Relieving factors: medications  PRECAUTIONS: None  WEIGHT BEARING RESTRICTIONS: No  FALLS:  Has patient fallen in last 6 months? No  LIVING ENVIRONMENT: Lives with: lives with their family Lives in: House/apartment Stairs: entry stairs.  Difficulty due to back pain.   Has following equipment at home: None  OCCUPATION: Seated job at [employer].  Has a standing/ adjustable desk.    PLOF: Independent  PATIENT GOALS: Decrease low back pain/ radicular symptoms.    NEXT MD VISIT: 06/02/22 with Neurosurgery office.    OBJECTIVE:   DIAGNOSTIC FINDINGS:  CLINICAL DATA:  Chronic, progressively worsening low back pain radiating into the right hip. No injury or prior surgery.   EXAM: MRI LUMBAR SPINE WITHOUT CONTRAST   TECHNIQUE: Multiplanar, multisequence MR imaging of the lumbar spine was performed. No intravenous contrast was administered.   COMPARISON:  CT lumbar spine dated March 25, 2022.   FINDINGS: Segmentation:  Standard.   Alignment:  Physiologic.   Vertebrae:  No fracture, evidence of discitis, or bone lesion.   Conus medullaris and cauda equina: Conus extends to the L1-L2 level. Conus and cauda equina appear normal.   Paraspinal and other soft tissues: Negative.   Disc levels:   T12-L1:  Negative.   L1-L2:  Negative.   L2-L3:  Minimal disc bulging.  No stenosis.   L3-L4:  Mild disc bulging.  No stenosis.   L4-L5: Mild disc bulging with unchanged superimposed large right foraminal and far lateral disc protrusion contacting and displacing the exiting right L4 nerve  root. Mild right neuroforaminal stenosis. No spinal canal or left neuroforaminal stenosis.   L5-S1: Unchanged circumferential disc osteophyte complex eccentric to the right. Unchanged mild bilateral facet arthropathy. Unchanged mild-to-moderate bilateral neuroforaminal stenosis. No spinal canal stenosis.   IMPRESSION: 1. Unchanged large right foraminal and far lateral disc protrusion at L4-L5 contacting and displacing the exiting right L4 nerve root. 2. 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   Electronically Signed   By: Titus Dubin M.D.   On: 05/15/2022 10:24  PATIENT SURVEYS:  FOTO initial 42/ goal 58  SCREENING FOR RED FLAGS: Bowel or bladder incontinence: Yes:   Spinal tumors: No Cauda equina syndrome: No Compression fracture: No Abdominal aneurysm: No  COGNITION: Overall cognitive status: Within functional limits for tasks assessed     SENSATION: TBD  MUSCLE LENGTH: Hamstrings: Right 17 deg (pain limited); Left 30 deg Thomas test: N/T at this time (pain).    POSTURE: rounded shoulders and forward head  PALPATION: (+) R superior glut and L4-5 tenderness  LUMBAR ROM:   AROM eval  Flexion 55 deg. (No radicular symptoms).  Extension 15 deg.  Right lateral flexion 50% limited  Left lateral flexion 25% limited  Right rotation 75% limited  Left rotation Unable (shooting pain reported).     (Blank rows = not tested)  Pt.  Sleeps on R side at night.  Pt. Occasionally uses pillow between knees.    LOWER EXTREMITY ROM:       Testing limited due to increase in R LE symptoms/ exacerbation of R lower leg/ foot radicular symptoms "pins and needles".    Active  Right eval Left eval  Hip flexion Very limited due to pain 90 deg. (Pain)  Hip extension    Hip abduction    Hip adduction    Hip internal rotation 31 deg. 34 deg.  Hip external rotation 24 deg. 24 deg.  Knee flexion    Knee extension    Ankle dorsiflexion    Ankle  plantarflexion    Ankle inversion    Ankle eversion     (Blank rows = not tested)  LOWER EXTREMITY MMT:    MMT Right eval Left eval  Hip flexion 4/5 4/5  Hip extension    Hip abduction    Hip adduction    Hip internal rotation 4/5 4/5  Hip external rotation 4/5 4/5  Knee flexion 4/5 5/5  Knee extension 5/5 5/5  Ankle dorsiflexion    Ankle plantarflexion    Ankle inversion    Ankle eversion     (Blank rows = not tested)  LUMBAR SPECIAL TESTS:  Straight leg raise test: Positive (R LE positive)  FUNCTIONAL TESTS:  TBD  GAIT: Distance walked: In clinic Assistive device utilized: None Level of assistance: Complete Independence Comments: R antalgic gait noted.    TODAY'S TREATMENT:                                                                                                                              DATE: 06/09/22   Subjective: Pt. Arrived to PT clinic reporting 4/10 LBP on NPS. Pt reports LBP of 10/10 on NPS after a short walk over the weekend. Pt. States she talked to the pain clinic about decreasing pain via shots before continuing with PT. Pt. Reports increased radicular sx since the weekend.   TENS unit:  EMPI Continuum IFC to low back (preset protocol) at 18 mA during tx./ there.ex.  Pt. Understands how to set-up/ manage TENS unit for home use.  Pt. Will continue to use unit at home for pain mgmt.    There Ex.: ( w/ TENS unit applied @ 18 mA) -Seated frwd./bkwd. pelvic tilts with TrA contraction. 2x10 each direction  -Seated spinal flexion stretches with blue ball. Fwd/lateral. 10x each direction with 5 sec. Hold.   Manual Tx: -B supine nerve glides 2x10 each side. Pt.s Radicular Sx. Were reproduced   PATIENT EDUCATION:  Education details: Access Code: [email or web address] Person educated: Patient Education method: Explanation, Demonstration, and Handouts Education comprehension: verbalized understanding and returned demonstration  HOME EXERCISE PROGRAM: Access  Code: [email or web address] URL: [email or web address] Date: 05/21/2022 Prepared by: Dorcas Carrow  Exercises - Lower Trunk Rotations  - 1 x daily - 7 x weekly - 3 sets - 10 reps - Hooklying  Single Knee to Chest Stretch  - 1 x daily - 7 x weekly - 3 sets - 10 reps - Seated Pelvic Tilt  - 1 x daily - 7 x weekly - 3 sets - 10 reps - Seated Diaphragmatic Breathing  - 1 x daily - 7 x weekly - 3 sets - 10 reps  ASSESSMENT:  CLINICAL IMPRESSION: Pt. Arrived to PT reporting 4/10 back pain. Pt. has had an increase in radicular sx in L LE. Pt. Had a severe increase in LBP after the last treatment that has began to move up into her thoracic spine. PT was very limited in there ex. Activity in today's treatment due to extreme irritability of patients lumbar spine. Pt. Educated on the importance of regularly contracting TrA in order to relieve some of the pressure off of her lumbar spine during both sitting and standing activities. Pt. Used the TENS unit with two channels applied in an IFC pattern at intensity of 59m to help decrease pain and promote there ex. Activity/treatment. PT performed gentle supine nerve glides to try and reduce radicular sx with no improvement and an increase in pain. PT recommends seeking pain modulation treatment before returning to PT due to pt.'s extreme irritability limiting there ex activity/pt. progression.   OBJECTIVE IMPAIRMENTS: Abnormal gait, decreased activity tolerance, decreased endurance, decreased mobility, difficulty walking, decreased ROM, decreased strength, hypomobility, impaired flexibility, improper body mechanics, postural dysfunction, obesity, and pain.   ACTIVITY LIMITATIONS: lifting, bending, standing, squatting, stairs, and locomotion level  PARTICIPATION LIMITATIONS: cleaning, driving, community activity, and occupation  PERSONAL FACTORS: Fitness and Past/current experiences are also affecting patient's functional outcome.   REHAB POTENTIAL:  Good  CLINICAL DECISION MAKING: Evolving/moderate complexity  EVALUATION COMPLEXITY: Moderate   GOALS: Goals reviewed with patient? Yes  SHORT TERM GOALS: Target date: 06/11/22  Pt. Independent with HEP to increase lumbar AROM to [facility] to improve pain-free mobility.   Baseline:  see above Goal status: Not met   LONG TERM GOALS: Target date: 07/02/22  Pt. Will increase FOTO to 58 to improve pain-free mobility.   Baseline: initial 42 Goal status: INITIAL  2.  Pt. Will increase B LE muscle strength 1/2 muscle grade to improve standing tolerance/ daily mobility at home/work.   Baseline:  see above Goal status: INITIAL  3.  Pt. Will report 50% improvement in overall low back pain with no radicular symptoms to improve functional mobility.  Baseline: 10/10 low back pain during evaluation with radicular symptoms.  Goal status: INITIAL   PLAN:  PT FREQUENCY: 2x/week  PT DURATION: 6 weeks  PLANNED INTERVENTIONS: Therapeutic exercises, Therapeutic activity, Neuromuscular re-education, Balance training, Gait training, Patient/Family education, Self Care, and Joint mobilization.  Trigger point dry needling/ Modalities.    PLAN FOR NEXT SESSION: Reassess HEP/ progress strengthening exercises if pain permits.    Shatora Weatherbee B. WRogers Blocker SPT MPura Spice PT, DPT # [id number]/13/2024, 6:45 PM

## 2022-06-10 ENCOUNTER — Encounter: Payer: Self-pay | Admitting: Physical Therapy

## 2022-06-11 ENCOUNTER — Ambulatory Visit: Payer: No Typology Code available for payment source | Admitting: Physical Therapy

## 2022-06-13 IMAGING — MR MR HEAD WO/W CM
15 series · 48 of 48 positions shown · IV contrast (gadavist)
Comparison: Head CT April 21, 2020.

CLINICAL DATA: Demyelinating disease (HCC) 51H.X ([id number]).

EXAM:
MRI HEAD WITHOUT AND WITH CONTRAST
TECHNIQUE: Multiplanar, multiecho pulse sequences of the brain and surrounding
structures were obtained without and with intravenous contrast.
CONTRAST:  10mL GADAVIST GADOBUTROL 1 MMOL/ML IV SOLN

[Series 5: ax dwi_tracew · axial · 3.0mm · 0.65mm/px · z∈[-93,+62]mm · 4 of 48 slices shown]
[im 1/48]
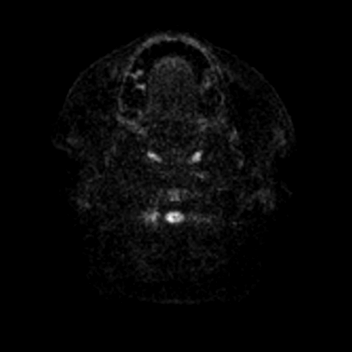
[im 16/48]
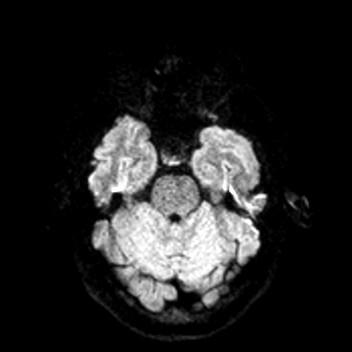
[im 32/48]
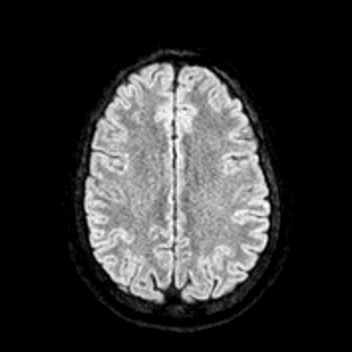
[im 48/48]
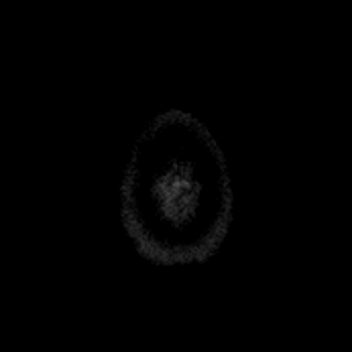

[Series 6: ax dwi_adc · axial · 3.0mm · 0.65mm/px · z∈[-93,+62]mm · 3 of 48 slices shown]
[im 1/48]
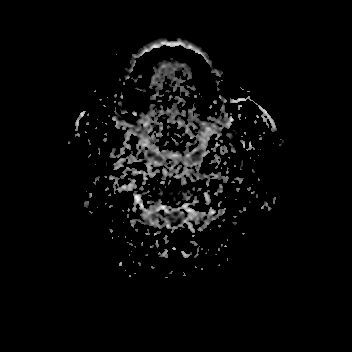
[im 24/48]
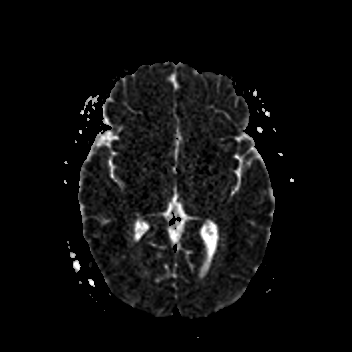
[im 48/48]
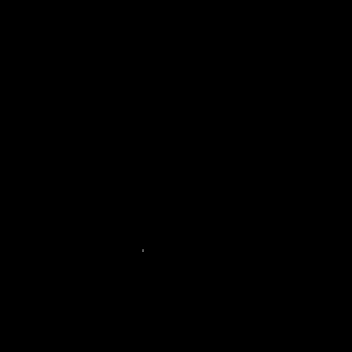

[Series 7: cor dwi_tracew · coronal · 5.0mm · 0.68mm/px · 2 of 40 slices shown]
[im 1/40]
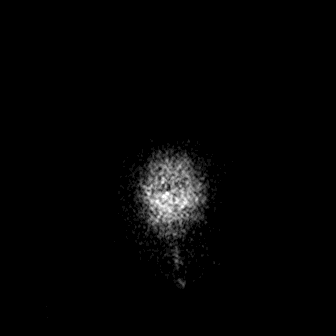
[im 40/40]
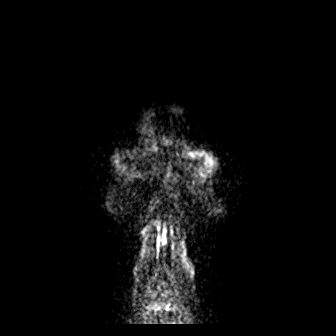

[Series 8: cor dwi_adc · coronal · 5.0mm · 0.68mm/px · 2 of 40 slices shown]
[im 1/40]
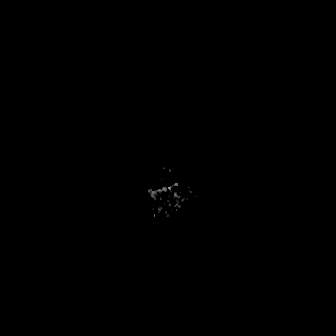
[im 40/40]
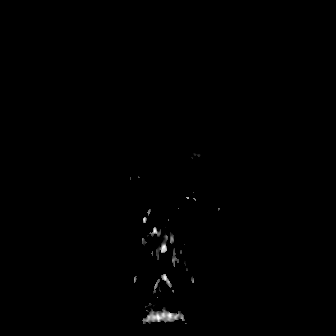

[Series 9: T1 · sagittal · 5.0mm · 0.62mm/px · 1 of 23 slices shown (1 of 2)]
[im 1/23]
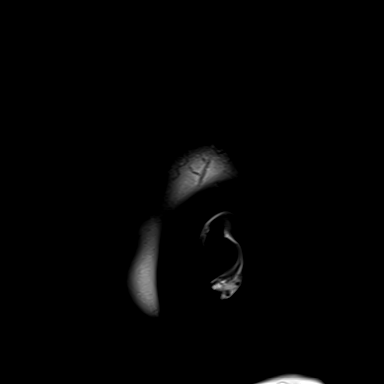

[Series 10: FLAIR · sagittal · 5.0mm · 0.94mm/px · 1 of 23 slices shown (1 of 2)]
[im 1/23]
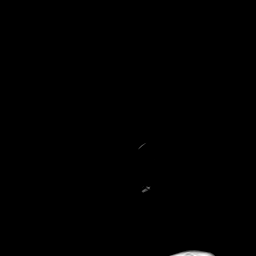

[Series 11: T2 · axial · 5.0mm · 0.53mm/px · 1 of 27 slices shown]
[im 1/27]
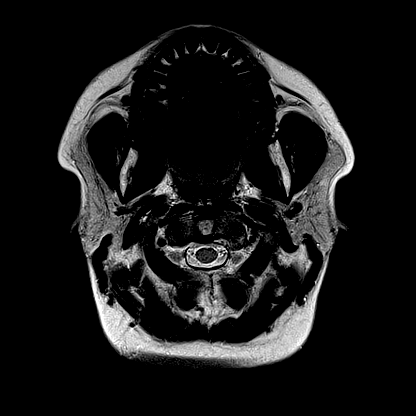

[Series 12: mag_images · axial · 3.0mm · 0.90mm/px · z∈[-103,+73]mm · 3 of 60 slices shown]
[im 1/60]
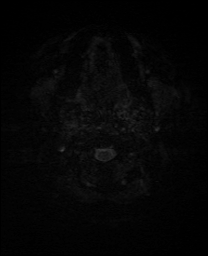
[im 30/60]
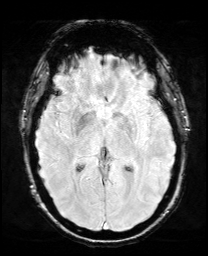
[im 60/60]
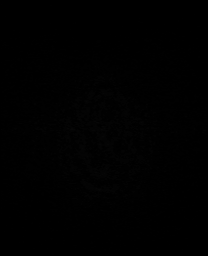

[Series 13: pha_images · axial · 3.0mm · 0.90mm/px · z∈[-103,+73]mm · 3 of 59 slices shown]
[im 1/59]
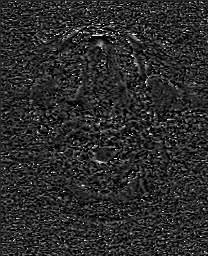
[im 30/59]
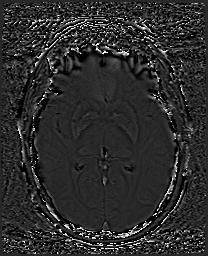
[im 59/59]
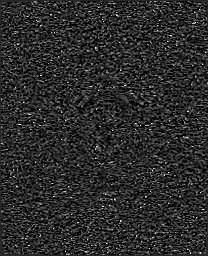

[Series 14: swi_images · axial · 3.0mm · 0.90mm/px · z∈[-103,+73]mm · 3 of 60 slices shown]
[im 1/60]
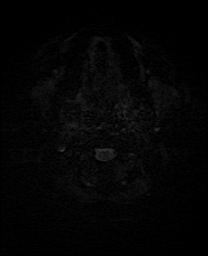
[im 30/60]
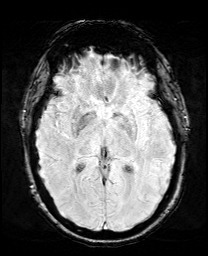
[im 60/60]
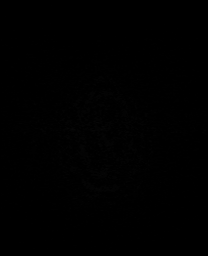

[Series 16: FLAIR · axial · 3.0mm · 0.53mm/px · z∈[-96,+66]mm · 3 of 55 slices shown (2 of 2)]
[im 1/55]
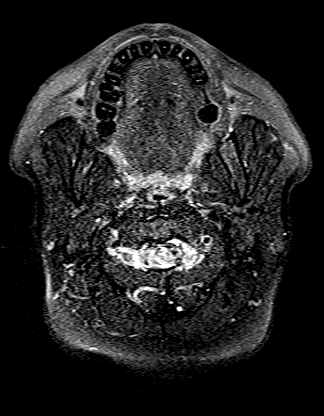
[im 28/55]
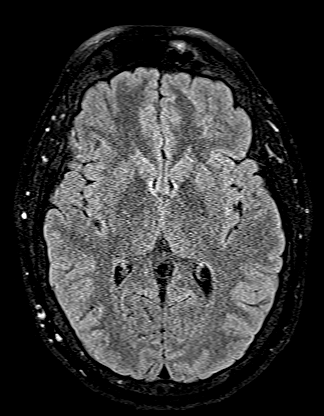
[im 55/55]
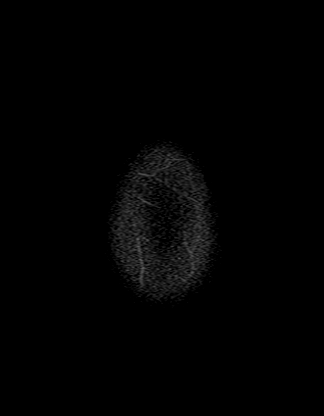

[Series 17: T1 · axial · 1.0mm · 0.98mm/px · z∈[-94,+81]mm · 9 of 176 slices shown (2 of 2)]
[im 1/176]
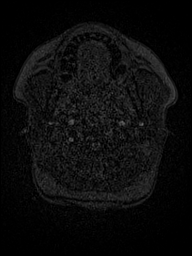
[im 22/176]
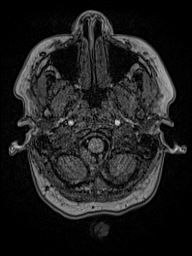
[im 44/176]
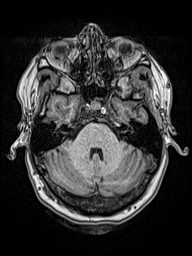
[im 66/176]
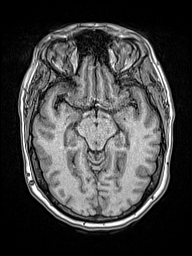
[im 88/176]
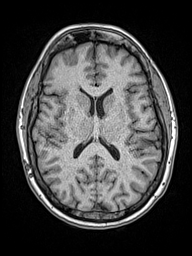
[im 110/176]
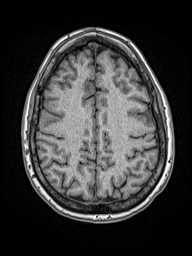
[im 132/176]
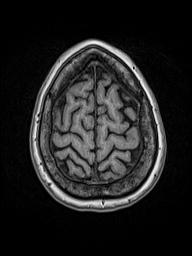
[im 154/176]
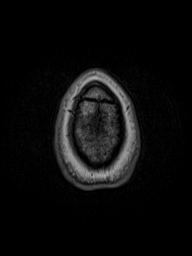
[im 176/176]
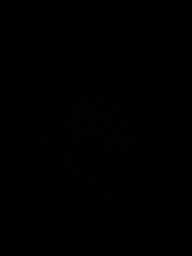

[Series 18: T2 post-contrast · coronal · 5.0mm · 0.57mm/px · 2 of 31 slices shown]
[im 1/31]
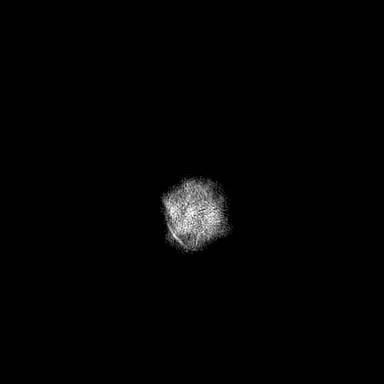
[im 31/31]
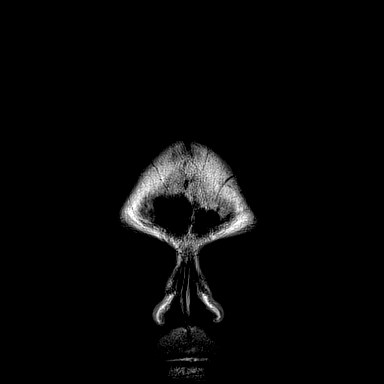

[Series 19: T1 post-contrast · axial · 1.0mm · 0.98mm/px · z∈[-94,+81]mm · 9 of 174 slices shown (1 of 2)]
[im 1/174]
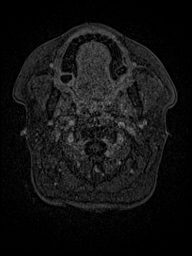
[im 22/174]
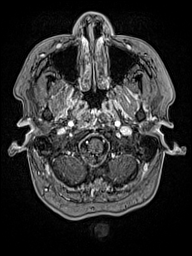
[im 44/174]
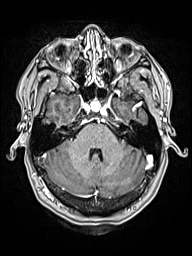
[im 65/174]
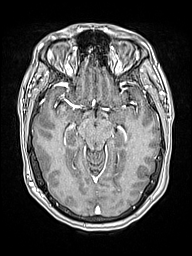
[im 87/174]
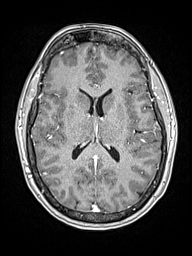
[im 109/174]
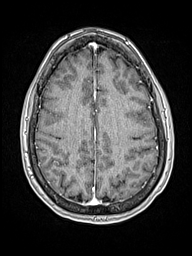
[im 130/174]
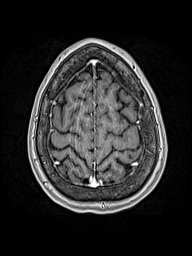
[im 152/174]
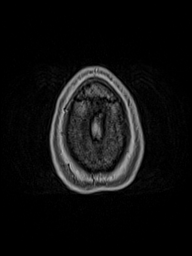
[im 174/174]
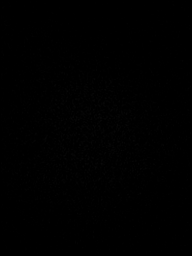

[Series 20: T1 post-contrast · coronal · 5.0mm · 0.57mm/px · 2 of 31 slices shown (2 of 2)]
[im 1/31]
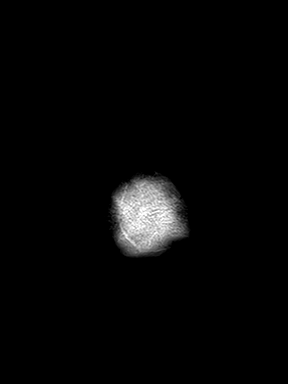
[im 31/31]
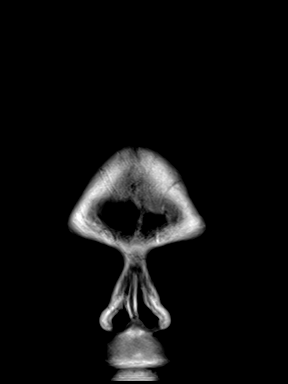

[48 of 48 positions shown; findings below may reference images not displayed]

FINDINGS: Brain: No acute infarction, hemorrhage, hydrocephalus, extra-axial
collection or mass lesion. The brain parenchyma has normal
morphology and signal characteristics. No focus of abnormal contrast
enhancement identified.

Vascular: Normal flow voids.

Skull and upper cervical spine: Normal marrow signal.

Sinuses/Orbits: Negative.

Other: Left mastoid effusion.
IMPRESSION: Unremarkable MRI of the brain.

## 2022-06-16 ENCOUNTER — Ambulatory Visit: Payer: No Typology Code available for payment source | Admitting: Physical Therapy

## 2022-06-17 DIAGNOSIS — M5416 Radiculopathy, lumbar region: Secondary | ICD-10-CM | POA: Diagnosis not present

## 2022-06-17 DIAGNOSIS — M5126 Other intervertebral disc displacement, lumbar region: Secondary | ICD-10-CM | POA: Diagnosis not present

## 2022-06-18 ENCOUNTER — Ambulatory Visit: Payer: No Typology Code available for payment source | Admitting: Physical Therapy

## 2022-06-24 ENCOUNTER — Encounter: Payer: Self-pay | Admitting: Physical Therapy

## 2022-06-25 ENCOUNTER — Encounter: Payer: Self-pay | Admitting: Physical Therapy

## 2022-06-25 ENCOUNTER — Ambulatory Visit: Payer: No Typology Code available for payment source | Admitting: Neurosurgery

## 2022-06-25 DIAGNOSIS — M47817 Spondylosis without myelopathy or radiculopathy, lumbosacral region: Secondary | ICD-10-CM | POA: Diagnosis not present

## 2022-06-25 NOTE — Progress Notes (Signed)
Neurosurgery Telephone (Audio-Only) Note  Requesting Provider     No referring provider defined for this encounter. T: N/A F:   Primary Care Provider Juline Patch, MD [address] [place] [address] T: [phone] F: [phone]  Telehealth visit was conducted with Margaret Cuevas, a 47 y.o. female via telephone. Pt understands the limitations of a telephone visit  History of Present Illness: 06/25/22  Margaret Cuevas is a 47 y.o presenting today via telephone visit to discuss her send and worsening symptoms despite your recent injection with Dr. Santa Lighter.  She states that she is not having symptoms bilaterally as well as perineal numbness.  She denies any lower extremity weakness.  06/01/22 Margaret Cuevas is a 47 y.o with a history of right sided lumbar radiculopathy presenting today via telephone visit to review her response to PT and discuss her MRI results. She reports no significant change in her symptoms. No new or worsening symptoms.  04/28/22 Margaret Cuevas is a 47 y.o with a history of anxiety, fibromyalgia who is here today with a chief complaint of back pain. She was seen in the ER on 03/25/22 for this. At the time she complained of back pain more severe than usual with strong point tenderness along the midline of her lumbar spine without an recent inciting event. At the time she was taking gabapentin, Flexeril, and Tylenol. At lumbar CT scan was done which was without acute fracture but did show signs concerning for broad disc protrusions at L4-5 and L5-S1. Today she reports a 3 month history of increased low back pain although she endorses about 1 year of vaginal and groin pain.  She describes pain that starts in the middle of her back and radiates to her tailbone and into her bilateral hips worse on the right and some pain into her posterior thighs. She states that she will occasionally have severe sharp pains in her third toe on her right foot.  She will also  occasionally have radiating pain down her right leg and this occurs typically when she feels as though her back is "locking up".  Her symptoms seem to do better with standing and leaning forward and worse with sitting.  She denies any weakness in her lower extremities or bowel or bladder incontinence. She underwent PT at [facility] for vaginal pain without any significant improvement. She states she was told symptoms are likely coming from her back and was given back exercises however she did not undergo any formal physical therapy for her low back. Note, she reports smoking tobacco socially.   Past Surgery: Lumbar discectomy in her teens   Margaret Cuevas has no symptoms of cervical myelopathy.   The symptoms are causing a significant impact on the patient's life.   General Review of Systems:  A ROS was performed including pertinent positive and negatives as documented.  All other systems are negative.   Prior to Admission medications   Medication Sig Start Date End Date Taking? Authorizing Provider  busPIRone (BUSPAR) 7.5 MG tablet Take 1 tablet (7.5 mg total) by mouth 2 (two) times daily. 05/13/22   Juline Patch, MD  celecoxib (CELEBREX) 100 MG capsule Take 1 capsule by mouth 2 (two) times daily. Rheum 01/15/21   [provider]  cetirizine (ZYRTEC) 10 MG tablet Take 1 tablet (10 mg total) by mouth daily. 05/13/22   Juline Patch, MD  cyclobenzaprine (FLEXERIL) 10 MG tablet TAKE 1 TABLET(10 MG) BY MOUTH AT BEDTIME 06/20/21  Juline Patch, MD  DULoxetine (CYMBALTA) 60 MG capsule Take 60 mg by mouth. Once or twice a day    [provider]  estradiol (VIVELLE-DOT) 0.1 MG/24HR patch Place 1 patch (0.1 mg total) onto the skin 2 (two) times a week. Dr Ouida Sills 01/02/21   Juline Patch, MD  gabapentin (NEURONTIN) 100 MG capsule Take 100 mg by mouth 3 (three) times daily. Rheum 01/15/21   [provider]  gabapentin (NEURONTIN) 100 MG capsule Take 1 capsule (100 mg total)  by mouth at bedtime. 04/28/22   Loleta Dicker, PA  nortriptyline (PAMELOR) 10 MG capsule neurology 07/02/21   [provider]    DATA REVIEWED    Imaging Studies  MRI L spine 05/14/22 FINDINGS: Segmentation:  Standard.   Alignment:  Physiologic.   Vertebrae:  No fracture, evidence of discitis, or bone lesion.   Conus medullaris and cauda equina: Conus extends to the L1-L2 level. Conus and cauda equina appear normal.   Paraspinal and other soft tissues: Negative.   Disc levels:   T12-L1:  Negative.   L1-L2:  Negative.   L2-L3:  Minimal disc bulging.  No stenosis.   L3-L4:  Mild disc bulging.  No stenosis.   L4-L5: Mild disc bulging with unchanged superimposed large right foraminal and far lateral disc protrusion contacting and displacing the exiting right L4 nerve root. Mild right neuroforaminal stenosis. No spinal canal or left neuroforaminal stenosis.   L5-S1: Unchanged circumferential disc osteophyte complex eccentric to the right. Unchanged mild bilateral facet arthropathy. Unchanged mild-to-moderate bilateral neuroforaminal stenosis. No spinal canal stenosis.   IMPRESSION: 1. Unchanged large right foraminal and far lateral disc protrusion at L4-L5 contacting and displacing the exiting right L4 nerve root. 2. 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     Electronically Signed   By: Titus Dubin M.D.   On: 05/15/2022 10:24    IMPRESSION  Margaret Cuevas is a 47 y.o. female who I performed a telephone encounter today for evaluation and management of lumbar radiculopathy   PLAN  Margaret Cuevas is a pleasant 47 y.o presenting via phone visit to review her worsening symptoms. Despite an injection with Dr. Sharlet Salina on 06/17/22, she has had worsening pain and a new onset on numbness.  She states that her symptoms are so significant that she is unable to participate with physical therapy.  She will reach out to physical therapy to be  formally discharged as they did not feel she could continue to participate.  If she is formally discharged from physical therapy, we will get her scheduled with Dr. Cari Caraway to discuss possible surgical intervention.  She will get in touch with physical therapy and let us know when she is discharged. No orders of the defined types were placed in this encounter.  DISPOSITION  Follow up: In person appointment with Dr. Izora Ribas after d/c'ed from PT.  Loleta Dicker, PA   TELEPHONE DOCUMENTATION   This visit was performed via telephone.  Patient location: home Provider location: office  I spent a total of 5 minutes non-face-to-face activities for this visit on the date of this encounter including review of current clinical condition and response to treatment.  The patient is aware of and accepts the limits of this telehealth visit.

## 2022-07-01 ENCOUNTER — Other Ambulatory Visit: Payer: Self-pay | Admitting: Family Medicine

## 2022-07-03 ENCOUNTER — Other Ambulatory Visit: Payer: Self-pay | Admitting: Neurosurgery

## 2022-07-03 MED ORDER — TRAMADOL HCL 50 MG PO TABS: 50.0000 mg | ORAL_TABLET | Freq: Two times a day (BID) | ORAL | 0 refills | Status: AC | PRN

## 2022-07-08 ENCOUNTER — Ambulatory Visit
Admission: EM | Admit: 2022-07-08 | Discharge: 2022-07-08 | Disposition: A | Discharge status: Home / Self Care | Payer: No Typology Code available for payment source | Attending: Emergency Medicine | Admitting: Emergency Medicine

## 2022-07-08 ENCOUNTER — Other Ambulatory Visit: Payer: Self-pay

## 2022-07-08 ENCOUNTER — Ambulatory Visit (INDEPENDENT_AMBULATORY_CARE_PROVIDER_SITE_OTHER): Payer: No Typology Code available for payment source

## 2022-07-08 DIAGNOSIS — F419 Anxiety disorder, unspecified: Secondary | ICD-10-CM | POA: Insufficient documentation

## 2022-07-08 DIAGNOSIS — Z8249 Family history of ischemic heart disease and other diseases of the circulatory system: Secondary | ICD-10-CM | POA: Insufficient documentation

## 2022-07-08 DIAGNOSIS — J069 Acute upper respiratory infection, unspecified: Secondary | ICD-10-CM

## 2022-07-08 DIAGNOSIS — J029 Acute pharyngitis, unspecified: Secondary | ICD-10-CM | POA: Diagnosis present

## 2022-07-08 DIAGNOSIS — I10 Essential (primary) hypertension: Secondary | ICD-10-CM | POA: Diagnosis not present

## 2022-07-08 DIAGNOSIS — Z1152 Encounter for screening for COVID-19: Secondary | ICD-10-CM | POA: Insufficient documentation

## 2022-07-08 DIAGNOSIS — M797 Fibromyalgia: Secondary | ICD-10-CM | POA: Diagnosis not present

## 2022-07-08 DIAGNOSIS — R197 Diarrhea, unspecified: Secondary | ICD-10-CM | POA: Diagnosis present

## 2022-07-08 DIAGNOSIS — H9209 Otalgia, unspecified ear: Secondary | ICD-10-CM | POA: Diagnosis present

## 2022-07-08 LAB — GROUP A STREP BY PCR: Group A Strep by PCR: NOT DETECTED

## 2022-07-08 LAB — SARS CORONAVIRUS 2 BY RT PCR: SARS Coronavirus 2 by RT PCR: NEGATIVE

## 2022-07-08 MED ORDER — IPRATROPIUM BROMIDE 0.06 % NA SOLN: 2.0000 | Freq: Four times a day (QID) | NASAL | 12 refills | Status: DC

## 2022-07-08 MED ORDER — BENZONATATE 100 MG PO CAPS: 200.0000 mg | ORAL_CAPSULE | Freq: Three times a day (TID) | ORAL | 0 refills | Status: DC

## 2022-07-08 NOTE — Discharge Instructions (Addendum)
Your testing today was negative for COVID, strep, and pneumonia.  Your EKG was also very reassuring.  The cause of your elevated blood pressure is unclear though I want you to continue to monitor your blood pressure at home and if it remains elevated you need to make an appointment with Dr. Ronnald Ramp to discuss treatment options.  If you have any increase in headache, dizziness, develop chest pain, shortness of breath, or fainting you need to call 911 and go to the ER.  Use the Atrovent nasal spray, 2 squirts up each nostril every 6 hours, to help with nasal congestion and postnasal drip.  Use over-the-counter Tylenol and/or ibuprofen according to the package instructions as needed for pain or fever.  Use the Tessalon Perles every 8 hours during the day as needed for cough.  Take them with a small sip of water.  They may give you numbness to the base of your tongue or metallic taste in her mouth, this is normal.  You may also use over-the-counter Coricidin HBP to help with your cold symptoms.

## 2022-07-08 NOTE — ED Triage Notes (Signed)
Symptoms started on Monday with sore throat and has progressed to headache and ear pain with nasal congestion.

## 2022-07-08 NOTE — ED Provider Notes (Signed)
[facility]    CSN: [id number] Arrival date & time: 07/08/22  [id number]      History   Chief Complaint Chief Complaint  Patient presents with   Sore Throat   Otalgia   Headache    Symptoms started on Monday with sore throat and has progressed to headache and ear pain and congestion.    Diarrhea    HPI Margaret Cuevas is a 47 y.o. female.   HPI  47 year old female here for evaluation of respiratory symptoms.  The patient's past medical history is significant for anxiety and fibromyalgia presenting for evaluation of 2 days of headache, nasal congestion, ear pain, sore throat with a loss of voice, chills, nausea, postnasal drip, nonproductive cough, and dizziness.  She denies fever, chest pain, shortness of breath, or changes in vision.  She does have an elevated blood pressure in clinic of 144/109 with no prior history of hypertension.  She does report a family history of high blood pressure.  Past Medical History:  Diagnosis Date   Anxiety    Fibromyalgia     Patient Active Problem List   Diagnosis Date Noted   H/O total hysterectomy 07/15/2020   History of appendectomy 07/15/2020   Lipoma of back 11/15/2018   Fibromyalgia 10/25/2017   Influenza vaccine needed 01/20/2017    Past Surgical History:  Procedure Laterality Date   ABDOMINAL HYSTERECTOMY     ANKLE FRACTURE SURGERY     plates and screws placed on both sides   APPENDECTOMY     BACK SURGERY     CESAREAN SECTION     CHOLECYSTECTOMY     COLONOSCOPY N/A 05/04/2022   Procedure: COLONOSCOPY;  Surgeon: [name], DO;  Location: [facility] ENDOSCOPY;  Service: Gastroenterology;  Laterality: N/A;   EAR TUBE REMOVAL     ESOPHAGOGASTRODUODENOSCOPY N/A 05/04/2022   Procedure: ESOPHAGOGASTRODUODENOSCOPY (EGD);  Surgeon: [name], DO;  Location: [facility] ENDOSCOPY;  Service: Gastroenterology;  Laterality: N/A;   GASTRIC BYPASS  2006   ORIF FINGER / THUMB FRACTURE     screws in thumb    OB  History   No obstetric history on file.      Home Medications    Prior to Admission medications   Medication Sig Start Date End Date Taking? Authorizing Provider  benzonatate (TESSALON) 100 MG capsule Take 2 capsules (200 mg total) by mouth every 8 (eight) hours. 07/08/22  Yes Margarette Canada, NP  ipratropium (ATROVENT) 0.06 % nasal spray Place 2 sprays into both nostrils 4 (four) times daily. 07/08/22  Yes Margarette Canada, NP  traMADol (ULTRAM) 50 MG tablet Take 1 tablet (50 mg total) by mouth every 12 (twelve) hours as needed for up to 10 days. 07/03/22 07/13/22  Loleta Dicker, PA  busPIRone (BUSPAR) 7.5 MG tablet Take 1 tablet (7.5 mg total) by mouth 2 (two) times daily. 05/13/22   Juline Patch, MD  celecoxib (CELEBREX) 100 MG capsule Take 1 capsule by mouth 2 (two) times daily. Rheum 01/15/21   [provider]  cetirizine (ZYRTEC) 10 MG tablet Take 1 tablet (10 mg total) by mouth daily. 05/13/22   Juline Patch, MD  cyclobenzaprine (FLEXERIL) 10 MG tablet TAKE 1 TABLET(10 MG) BY MOUTH AT BEDTIME 06/20/21   Juline Patch, MD  DULoxetine (CYMBALTA) 60 MG capsule Take 60 mg by mouth. Once or twice a day    [provider]  estradiol (VIVELLE-DOT) 0.1 MG/24HR patch Place 1 patch (0.1 mg total) onto the skin  2 (two) times a week. Dr Ouida Sills 01/02/21   Juline Patch, MD  gabapentin (NEURONTIN) 100 MG capsule Take 100 mg by mouth 3 (three) times daily. Rheum 01/15/21   [provider]  gabapentin (NEURONTIN) 100 MG capsule TAKE 1 CAPSULE(100 MG) BY MOUTH AT BEDTIME 06/09/22   Loleta Dicker, PA  nortriptyline [facility]) 10 MG capsule neurology 07/02/21   [provider]    Family History Family History  Problem Relation Age of Onset   Diabetes Mother    Hypertension Father    Cancer Maternal Grandmother    Diabetes Maternal Grandmother    Hypertension Maternal Grandmother    Hypertension Paternal Grandmother    Cancer Paternal Grandfather    Breast  cancer Cousin 17       pat cousin    Social History Social History   Tobacco Use   Smoking status: Some Days    Types: Cigarettes   Smokeless tobacco: Never  Substance Use Topics   Alcohol use: Yes   Drug use: No     Allergies   Ibuprofen   Review of Systems Review of Systems  Constitutional:  Positive for chills. Negative for fever.  HENT:  Positive for congestion, ear pain, rhinorrhea and sore throat.   Eyes:  Negative for visual disturbance.  Respiratory:  Positive for cough. Negative for shortness of breath and wheezing.   Cardiovascular:  Negative for chest pain.  Gastrointestinal:  Positive for nausea. Negative for diarrhea and vomiting.  Neurological:  Positive for dizziness and headaches.     Physical Exam Triage Vital Signs ED Triage Vitals  Enc Vitals Group     BP      Pulse      Resp      Temp      Temp src      SpO2      Weight      Height      Head Circumference      Peak Flow      Pain Score      Pain Loc      Pain Edu?      Excl. in [place]?    No data found.  Updated Vital Signs BP (!) 144/101   Pulse 68   Temp 98 F (36.7 C) (Oral)   Resp 18   SpO2 99%   Visual Acuity Right Eye Distance:   Left Eye Distance:   Bilateral Distance:    Right Eye Near:   Left Eye Near:    Bilateral Near:     Physical Exam Vitals and nursing note reviewed.  Constitutional:      Appearance: Normal appearance. She is not ill-appearing.  HENT:     Head: Normocephalic and atraumatic.     Right Ear: Tympanic membrane, ear canal and external ear normal. There is no impacted cerumen.     Left Ear: Tympanic membrane, ear canal and external ear normal. There is no impacted cerumen.     Nose: Congestion and rhinorrhea present.     Comments: Nasal mucosa is erythematous and edematous with clear discharge in both nares.    Mouth/Throat:     Mouth: Mucous membranes are moist.     Pharynx: Oropharynx is clear. Posterior oropharyngeal erythema present. No  oropharyngeal exudate.     Comments: Patient is mild erythema to the posterior oropharynx.  Tonsillar pillars are unremarkable. Eyes:     General: No scleral icterus.    Extraocular Movements: Extraocular movements intact.  Pupils: Pupils are equal, round, and reactive to light.  Neck:     Comments: Patient has tenderness with palpation of the anterior cervical region but no lymphadenopathy appreciated on exam. Cardiovascular:     Rate and Rhythm: Normal rate and regular rhythm.     Pulses: Normal pulses.     Heart sounds: Normal heart sounds. No murmur heard.    No friction rub. No gallop.  Pulmonary:     Effort: Pulmonary effort is normal.     Breath sounds: Normal breath sounds. No wheezing, rhonchi or rales.  Musculoskeletal:     Cervical back: Normal range of motion and neck supple. Tenderness present.  Lymphadenopathy:     Cervical: No cervical adenopathy.  Skin:    General: Skin is warm and dry.     Capillary Refill: Capillary refill takes less than 2 seconds.     Findings: No erythema or rash.  Neurological:     General: No focal deficit present.     Mental Status: She is alert and oriented to person, place, and time.  Psychiatric:        Mood and Affect: Mood normal.        Behavior: Behavior normal.        Thought Content: Thought content normal.        Judgment: Judgment normal.      UC Treatments / Results  Labs (all labs ordered are listed, but only abnormal results are displayed) Labs Reviewed  SARS CORONAVIRUS 2 BY RT PCR  GROUP A STREP BY PCR    EKG Normal sinus rhythm with a ventricular rate of 66 bpm PR interval 124 ms QRS duration 98 ms QT/QTc 420/440 ms No ST or T wave abnormalities noted.  Radiology DG Chest 2 View  Result Date: 07/08/2022 CLINICAL DATA:  Cough. EXAM: CHEST - 2 VIEW COMPARISON:  05/22/2017 FINDINGS: Normal heart size and mediastinal contours. No acute infiltrate or edema. No effusion or pneumothorax. No acute osseous  findings. IMPRESSION: No active cardiopulmonary disease. Electronically Signed   By: Jorje Guild M.D.   On: 07/08/2022 11:12    Procedures Procedures (including critical care time)  Medications Ordered in UC Medications - No data to display  Initial Impression / Assessment and Plan / UC Course  I have reviewed the triage vital signs and the nursing notes.  Pertinent labs & imaging results that were available during my care of the patient were reviewed by me and considered in my medical decision making (see chart for details).   Patient is a pleasant 47 year old female here for evaluation of COVID/flulike symptoms as outlined in HPI above.  Her symptoms began 2 days ago.  Orts chills and nausea yesterday and also elevated blood pressure that she checked with her husband's machine.  She does endorse dizziness but she denies any changes in vision, chest pain, or shortness of breath.  She also states that she has not taken any over-the-counter cold medication that could contribute to elevated blood pressure.  She has no documented history of elevated blood pressure on previous visits to our clinic or with her PCP.  She was just seen by her PCP for anxiety in January.  She has taken a home COVID test that was negative but she reports that she has missed 3 days of work due to this illness.  I will order a COVID PCR here in clinic as well as a strep PCR given the patient's sore throat.  Additionally, I am going to  order an EKG to evaluate her high blood pressure as well as a chest x-ray to rule out any acute cardiopulmonary process.  EKG shows normal sinus rhythm without any T wave or ST abnormalities.  Her EKG is unchanged when compared to EKG from 12/31/2020.  However, today's interpretation is stating minimal voltage criteria for LVH though may be normal variant.  Strep PCR is negative.  COVID PCR is negative.  Radiology impression of chest x-ray states that the heart size and mediastinal contours  are normal with no acute infiltrate or edema.  No active cardiopulmonary disease.  I will discharge patient home with a diagnosis of viral URI with cough and treat her with Atrovent nasal spray and Tessalon Perles.  She can use over-the-counter Coricidin HBP to help with the remainder of her cold symptoms.  I must not have her to continue to monitor her blood pressure at home and if it remains elevated she needs to make an appointment with Dr. Ronnald Ramp, her PCP, to discuss interventions.  ER precautions include development of chest pain, worsening headache, worsening dizziness, shortness of breath, or fainting.   Final Clinical Impressions(s) / UC Diagnoses   Final diagnoses:  Viral URI with cough  Hypertension, unspecified type     Discharge Instructions      Your testing today was negative for COVID, strep, and pneumonia.  Your EKG was also very reassuring.  The cause of your elevated blood pressure is unclear though I want you to continue to monitor your blood pressure at home and if it remains elevated you need to make an appointment with Dr. Ronnald Ramp to discuss treatment options.  If you have any increase in headache, dizziness, develop chest pain, shortness of breath, or fainting you need to call 911 and go to the ER.  Use the Atrovent nasal spray, 2 squirts up each nostril every 6 hours, to help with nasal congestion and postnasal drip.  Use over-the-counter Tylenol and/or ibuprofen according to the package instructions as needed for pain or fever.  Use the Tessalon Perles every 8 hours during the day as needed for cough.  Take them with a small sip of water.  They may give you numbness to the base of your tongue or metallic taste in her mouth, this is normal.  You may also use over-the-counter Coricidin HBP to help with your cold symptoms.     ED Prescriptions     Medication Sig Dispense Auth. Provider   benzonatate (TESSALON) 100 MG capsule Take 2 capsules (200 mg total) by  mouth every 8 (eight) hours. 21 capsule Margarette Canada, NP   ipratropium (ATROVENT) 0.06 % nasal spray Place 2 sprays into both nostrils 4 (four) times daily. 15 mL Margarette Canada, NP      PDMP not reviewed this encounter.   Margarette Canada, NP 07/08/22 [phone]

## 2022-07-10 NOTE — Progress Notes (Unsigned)
Referring Physician:  Juline Patch, MD [address] [place],  [place] [address]  Primary Physician:  Juline Patch, MD  History of Present Illness: 07/14/2022 [name] is here today with a chief complaint of right greater than left leg pain.  She gets pain into the buttocks that extends into her right anterior thigh and her medial calf.  She has numbness into her great toe on the right.  She also has pain in her third toe.  She has significant shooting pain down her right leg into her anterior thigh.  She also has some pain in her lower back, but this pain in her right leg is the worst.  Her pain is as bad as 10 out of 10.  Sitting and activity makes it worse.  Standing and leaning forward helps somewhat.  It is constant.  Bowel/Bladder Dysfunction: none  Conservative measures:  Physical therapy:  has participated in at [facility] from 05/21/22 to 06/09/22 Multimodal medical therapy including regular antiinflammatories:  gabapentin, flexeril, tylenol, celebrex, tramadol, duloxetine.  Injections:  has received epidural steroid injections 06/17/2022: Right L4-5 transforaminal ESI , with one day relief  Past Surgery:  Lumbar discectomy in her teens   Margaret Cuevas has no symptoms of cervical myelopathy.  The symptoms are causing a significant impact on the patient's life.   Saw Margaret Cuevas on 06/25/2022  History of Present Illness: 06/25/22  [name] is a 47 y.o presenting today via telephone visit to discuss her send and worsening symptoms despite your recent injection with Dr. Santa Lighter.  She states that she is not having symptoms bilaterally as well as perineal numbness.  She denies any lower extremity weakness.   06/01/22 Margaret Cuevas is a 47 y.o with a history of right sided lumbar radiculopathy presenting today via telephone visit to review her response to PT and discuss her MRI results. She reports no significant change in her symptoms. No new or worsening symptoms.    04/28/22 [name] is a 47 y.o with a history of anxiety, fibromyalgia who is here today with a chief complaint of back pain. She was seen in the ER on 03/25/22 for this. At the time she complained of back pain more severe than usual with strong point tenderness along the midline of her lumbar spine without an recent inciting event. At the time she was taking gabapentin, Flexeril, and Tylenol. At lumbar CT scan was done which was without acute fracture but did show signs concerning for broad disc protrusions at L4-5 and L5-S1. Today she reports a 3 month history of increased low back pain although she endorses about 1 year of vaginal and groin pain.  She describes pain that starts in the middle of her back and radiates to her tailbone and into her bilateral hips worse on the right and some pain into her posterior thighs. She states that she will occasionally have severe sharp pains in her third toe on her right foot.  She will also occasionally have radiating pain down her right leg and this occurs typically when she feels as though her back is "locking up".  Her symptoms seem to do better with standing and leaning forward and worse with sitting.  She denies any weakness in her lower extremities or bowel or bladder incontinence. She underwent PT at [facility] for vaginal pain without any significant improvement. She states she was told symptoms are likely coming from her back and was given back exercises however she  did not undergo any formal physical therapy for her low back. Note, she reports smoking tobacco socially.   Past Surgery: Lumbar discectomy in her teens     I have utilized the care everywhere function in epic to review the outside records available from external health systems.  Review of Systems:  A 10 point review of systems is negative, except for the pertinent positives and negatives detailed in the HPI.  Past Medical History: Past Medical History:  Diagnosis Date   Anxiety     Fibromyalgia     Past Surgical History: Past Surgical History:  Procedure Laterality Date   ABDOMINAL HYSTERECTOMY     ANKLE FRACTURE SURGERY     plates and screws placed on both sides   APPENDECTOMY     BACK SURGERY     CESAREAN SECTION     CHOLECYSTECTOMY     COLONOSCOPY N/A 05/04/2022   Procedure: COLONOSCOPY;  Surgeon: [name], DO;  Location: [facility] ENDOSCOPY;  Service: Gastroenterology;  Laterality: N/A;   EAR TUBE REMOVAL     ESOPHAGOGASTRODUODENOSCOPY N/A 05/04/2022   Procedure: ESOPHAGOGASTRODUODENOSCOPY (EGD);  Surgeon: [name], DO;  Location: [facility] ENDOSCOPY;  Service: Gastroenterology;  Laterality: N/A;   GASTRIC BYPASS  2006   ORIF FINGER / THUMB FRACTURE     screws in thumb    Allergies: Allergies as of 07/14/2022 - Review Complete 07/14/2022  Allergen Reaction Noted   Ibuprofen Other (See Comments)     Medications: Current Meds  Medication Sig   benzonatate (TESSALON) 100 MG capsule Take 2 capsules (200 mg total) by mouth every 8 (eight) hours.   busPIRone (BUSPAR) 7.5 MG tablet Take 1 tablet (7.5 mg total) by mouth 2 (two) times daily.   celecoxib (CELEBREX) 100 MG capsule Take 1 capsule by mouth 2 (two) times daily. Rheum   cetirizine (ZYRTEC) 10 MG tablet Take 1 tablet (10 mg total) by mouth daily.   cyclobenzaprine (FLEXERIL) 10 MG tablet TAKE 1 TABLET(10 MG) BY MOUTH AT BEDTIME   DULoxetine (CYMBALTA) 60 MG capsule Take 60 mg by mouth. Once or twice a day   estradiol (VIVELLE-DOT) 0.1 MG/24HR patch Place 1 patch (0.1 mg total) onto the skin 2 (two) times a week. Dr Schermerhorn   gabapentin (NEURONTIN) 100 MG capsule Take 100 mg by mouth 3 (three) times daily. Rheum   gabapentin (NEURONTIN) 100 MG capsule TAKE 1 CAPSULE(100 MG) BY MOUTH AT BEDTIME   ipratropium (ATROVENT) 0.06 % nasal spray Place 2 sprays into both nostrils 4 (four) times daily.   nortriptyline (PAMELOR) 10 MG capsule neurology    Social History: Social History    Tobacco Use   Smoking status: Some Days    Types: Cigarettes   Smokeless tobacco: Never  Substance Use Topics   Alcohol use: Yes   Drug use: No    Family Medical History: Family History  Problem Relation Age of Onset   Diabetes Mother    Hypertension Father    Cancer Maternal Grandmother    Diabetes Maternal Grandmother    Hypertension Maternal Grandmother    Hypertension Paternal Grandmother    Cancer Paternal Grandfather    Breast cancer Cousin 26       pat cousin    Physical Examination: Vitals:   07/14/22 1404  BP: 124/74  Pulse: 86  SpO2: 97%    General: Patient is well developed, well nourished, calm, collected, and in no apparent distress. Attention to examination is appropriate.  She is in obvious pain  Neck:   Supple.  Full range of motion.  Respiratory: Patient is breathing without any difficulty.   NEUROLOGICAL:     Awake, alert, oriented to person, place, and time.  Speech is clear and fluent.   Cranial Nerves: Pupils equal round and reactive to light.  Facial tone is symmetric.  Facial sensation is symmetric. Shoulder shrug is symmetric. Tongue protrusion is midline.  There is no pronator drift.  ROM of spine: full.    Strength: Side Biceps Triceps Deltoid Interossei Grip Wrist Ext. Wrist Flex.  R 5 5 5 5 5 5 5   L 5 5 5 5 5 5 5    Side Iliopsoas Quads Hamstring PF DF EHL  R 5 5 5 5 5 5   L 5 5 5 5 5 5    Reflexes are 1+ and symmetric at the biceps, triceps, brachioradialis, patella and achilles.   Hoffman's is absent.   Bilateral upper and lower extremity sensation is intact to light touch.    No evidence of dysmetria noted.  Gait is antalgic.  She has pain in her right leg with straight leg raise.  She is obviously uncomfortable.     Medical Decision Making  Imaging: MRI L spine 05/15/2022 IMPRESSION: 1. Unchanged large right foraminal and far lateral disc protrusion at L4-L5 contacting and displacing the exiting right L4 nerve  root. 2. 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     Electronically Signed   By: Titus Dubin M.D.   On: 05/15/2022 10:24  I have personally reviewed the images and agree with the above interpretation.  Assessment and Plan: Ms. Drummey is a pleasant 47 y.o. female with symptoms consistent with a right L4 radiculopathy.  She has a concordant L4-5 right foraminal disc herniation which compresses the right L4 nerve root.  She has additional complaints of back pain, but I think the radicular pain is her worst issue.  She has tried and failed conservative management.  No further conservative management is indicated at this time.  I recommended right-sided L4-5 far lateral discectomy and foraminotomy.  I discussed the planned procedure at length with the patient, including the risks, benefits, alternatives, and indications. The risks discussed include but are not limited to bleeding, infection, need for reoperation, spinal fluid leak, stroke, vision loss, anesthetic complication, coma, paralysis, and even death. I also described in detail that improvement was not guaranteed.  The patient expressed understanding of these risks, and asked that we proceed with surgery. I described the surgery in layman's terms, and gave ample opportunity for questions, which were answered to the best of my ability.  We discussed that some patients suffer a postoperative pain flare from this approach.  She expressed understanding.  I spent a total of 30 minutes in this patient's care today. This time was spent reviewing pertinent records including imaging studies, obtaining and confirming history, performing a directed evaluation, formulating and discussing my recommendations, and documenting the visit within the medical record.    Thank you for involving me in the care of this patient.      Shawndrea Rutkowski Cuevas. Izora Ribas MD, [facility] Neurosurgery

## 2022-07-10 NOTE — H&P (View-Only) (Signed)
Referring Physician:  Juline Patch, MD [address] [place],  [place] [address]  Primary Physician:  Juline Patch, MD  History of Present Illness: 07/14/2022 [name] is here today with a chief complaint of right greater than left leg pain.  She gets pain into the buttocks that extends into her right anterior thigh and her medial calf.  She has numbness into her great toe on the right.  She also has pain in her third toe.  She has significant shooting pain down her right leg into her anterior thigh.  She also has some pain in her lower back, but this pain in her right leg is the worst.  Her pain is as bad as 10 out of 10.  Sitting and activity makes it worse.  Standing and leaning forward helps somewhat.  It is constant.  Bowel/Bladder Dysfunction: none  Conservative measures:  Physical therapy:  has participated in at [facility] from 05/21/22 to 06/09/22 Multimodal medical therapy including regular antiinflammatories:  gabapentin, flexeril, tylenol, celebrex, tramadol, duloxetine.  Injections:  has received epidural steroid injections 06/17/2022: Right L4-5 transforaminal ESI , with one day relief  Past Surgery:  Lumbar discectomy in her teens   Margaret Cuevas has no symptoms of cervical myelopathy.  The symptoms are causing a significant impact on the patient's life.   Saw Margaret Cuevas on 06/25/2022  History of Present Illness: 06/25/22  Margaret Cuevas is a 47 y.o presenting today via telephone visit to discuss her send and worsening symptoms despite your recent injection with Dr. Santa Lighter.  She states that she is not having symptoms bilaterally as well as perineal numbness.  She denies any lower extremity weakness.   06/01/22 Margaret Cuevas is a 47 y.o with a history of right sided lumbar radiculopathy presenting today via telephone visit to review her response to PT and discuss her MRI results. She reports no significant change in her symptoms. No new or worsening symptoms.    04/28/22 Margaret Cuevas is a 47 y.o with a history of anxiety, fibromyalgia who is here today with a chief complaint of back pain. She was seen in the ER on 03/25/22 for this. At the time she complained of back pain more severe than usual with strong point tenderness along the midline of her lumbar spine without an recent inciting event. At the time she was taking gabapentin, Flexeril, and Tylenol. At lumbar CT scan was done which was without acute fracture but did show signs concerning for broad disc protrusions at L4-5 and L5-S1. Today she reports a 3 month history of increased low back pain although she endorses about 1 year of vaginal and groin pain.  She describes pain that starts in the middle of her back and radiates to her tailbone and into her bilateral hips worse on the right and some pain into her posterior thighs. She states that she will occasionally have severe sharp pains in her third toe on her right foot.  She will also occasionally have radiating pain down her right leg and this occurs typically when she feels as though her back is "locking up".  Her symptoms seem to do better with standing and leaning forward and worse with sitting.  She denies any weakness in her lower extremities or bowel or bladder incontinence. She underwent PT at [facility] for vaginal pain without any significant improvement. She states she was told symptoms are likely coming from her back and was given back exercises however she  did not undergo any formal physical therapy for her low back. Note, she reports smoking tobacco socially.   Past Surgery: Lumbar discectomy in her teens     I have utilized the care everywhere function in epic to review the outside records available from external health systems.  Review of Systems:  A 10 point review of systems is negative, except for the pertinent positives and negatives detailed in the HPI.  Past Medical History: Past Medical History:  Diagnosis Date   Anxiety     Fibromyalgia     Past Surgical History: Past Surgical History:  Procedure Laterality Date   ABDOMINAL HYSTERECTOMY     ANKLE FRACTURE SURGERY     plates and screws placed on both sides   APPENDECTOMY     BACK SURGERY     CESAREAN SECTION     CHOLECYSTECTOMY     COLONOSCOPY N/A 05/04/2022   Procedure: COLONOSCOPY;  Surgeon: [name], DO;  Location: [facility] ENDOSCOPY;  Service: Gastroenterology;  Laterality: N/A;   EAR TUBE REMOVAL     ESOPHAGOGASTRODUODENOSCOPY N/A 05/04/2022   Procedure: ESOPHAGOGASTRODUODENOSCOPY (EGD);  Surgeon: [name], DO;  Location: [facility] ENDOSCOPY;  Service: Gastroenterology;  Laterality: N/A;   GASTRIC BYPASS  2006   ORIF FINGER / THUMB FRACTURE     screws in thumb    Allergies: Allergies as of 07/14/2022 - Review Complete 07/14/2022  Allergen Reaction Noted   Ibuprofen Other (See Comments)     Medications: Current Meds  Medication Sig   benzonatate (TESSALON) 100 MG capsule Take 2 capsules (200 mg total) by mouth every 8 (eight) hours.   busPIRone (BUSPAR) 7.5 MG tablet Take 1 tablet (7.5 mg total) by mouth 2 (two) times daily.   celecoxib (CELEBREX) 100 MG capsule Take 1 capsule by mouth 2 (two) times daily. Rheum   cetirizine (ZYRTEC) 10 MG tablet Take 1 tablet (10 mg total) by mouth daily.   cyclobenzaprine (FLEXERIL) 10 MG tablet TAKE 1 TABLET(10 MG) BY MOUTH AT BEDTIME   DULoxetine (CYMBALTA) 60 MG capsule Take 60 mg by mouth. Once or twice a day   estradiol (VIVELLE-DOT) 0.1 MG/24HR patch Place 1 patch (0.1 mg total) onto the skin 2 (two) times a week. Dr Schermerhorn   gabapentin (NEURONTIN) 100 MG capsule Take 100 mg by mouth 3 (three) times daily. Rheum   gabapentin (NEURONTIN) 100 MG capsule TAKE 1 CAPSULE(100 MG) BY MOUTH AT BEDTIME   ipratropium (ATROVENT) 0.06 % nasal spray Place 2 sprays into both nostrils 4 (four) times daily.   nortriptyline (PAMELOR) 10 MG capsule neurology    Social History: Social History    Tobacco Use   Smoking status: Some Days    Types: Cigarettes   Smokeless tobacco: Never  Substance Use Topics   Alcohol use: Yes   Drug use: No    Family Medical History: Family History  Problem Relation Age of Onset   Diabetes Mother    Hypertension Father    Cancer Maternal Grandmother    Diabetes Maternal Grandmother    Hypertension Maternal Grandmother    Hypertension Paternal Grandmother    Cancer Paternal Grandfather    Breast cancer Cousin 26       pat cousin    Physical Examination: Vitals:   07/14/22 1404  BP: 124/74  Pulse: 86  SpO2: 97%    General: Patient is well developed, well nourished, calm, collected, and in no apparent distress. Attention to examination is appropriate.  She is in obvious pain  Neck:   Supple.  Full range of motion.  Respiratory: Patient is breathing without any difficulty.   NEUROLOGICAL:     Awake, alert, oriented to person, place, and time.  Speech is clear and fluent.   Cranial Nerves: Pupils equal round and reactive to light.  Facial tone is symmetric.  Facial sensation is symmetric. Shoulder shrug is symmetric. Tongue protrusion is midline.  There is no pronator drift.  ROM of spine: full.    Strength: Side Biceps Triceps Deltoid Interossei Grip Wrist Ext. Wrist Flex.  R 5 5 5 5 5 5 5   L 5 5 5 5 5 5 5    Side Iliopsoas Quads Hamstring PF DF EHL  R 5 5 5 5 5 5   L 5 5 5 5 5 5    Reflexes are 1+ and symmetric at the biceps, triceps, brachioradialis, patella and achilles.   Hoffman's is absent.   Bilateral upper and lower extremity sensation is intact to light touch.    No evidence of dysmetria noted.  Gait is antalgic.  She has pain in her right leg with straight leg raise.  She is obviously uncomfortable.     Medical Decision Making  Imaging: MRI L spine 05/15/2022 IMPRESSION: 1. Unchanged large right foraminal and far lateral disc protrusion at L4-L5 contacting and displacing the exiting right L4 nerve  root. 2. 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     Electronically Signed   By: Titus Dubin M.D.   On: 05/15/2022 10:24  I have personally reviewed the images and agree with the above interpretation.  Assessment and Plan: Margaret Cuevas is a pleasant 47 y.o. female with symptoms consistent with a right L4 radiculopathy.  She has a concordant L4-5 right foraminal disc herniation which compresses the right L4 nerve root.  She has additional complaints of back pain, but I think the radicular pain is her worst issue.  She has tried and failed conservative management.  No further conservative management is indicated at this time.  I recommended right-sided L4-5 far lateral discectomy and foraminotomy.  I discussed the planned procedure at length with the patient, including the risks, benefits, alternatives, and indications. The risks discussed include but are not limited to bleeding, infection, need for reoperation, spinal fluid leak, stroke, vision loss, anesthetic complication, coma, paralysis, and even death. I also described in detail that improvement was not guaranteed.  The patient expressed understanding of these risks, and asked that we proceed with surgery. I described the surgery in layman's terms, and gave ample opportunity for questions, which were answered to the best of my ability.  We discussed that some patients suffer a postoperative pain flare from this approach.  She expressed understanding.  I spent a total of 30 minutes in this patient's care today. This time was spent reviewing pertinent records including imaging studies, obtaining and confirming history, performing a directed evaluation, formulating and discussing my recommendations, and documenting the visit within the medical record.    Thank you for involving me in the care of this patient.      Verneice Caspers Cuevas. Izora Ribas MD, [facility] Neurosurgery

## 2022-07-14 ENCOUNTER — Encounter: Payer: Self-pay | Admitting: Neurosurgery

## 2022-07-14 ENCOUNTER — Ambulatory Visit (INDEPENDENT_AMBULATORY_CARE_PROVIDER_SITE_OTHER): Payer: No Typology Code available for payment source | Admitting: Neurosurgery

## 2022-07-14 VITALS — BP 124/74 | HR 86 | Ht 66.0 in | Wt 257.0 lb

## 2022-07-14 DIAGNOSIS — M5416 Radiculopathy, lumbar region: Secondary | ICD-10-CM

## 2022-07-14 NOTE — Patient Instructions (Signed)
Please see below for information in regards to your upcoming surgery:  Planned surgery: Right L4-5 far lateral discectomy   Surgery date: 08/07/22 - you will find out your arrival time the business day before your surgery.   Pre-op appointment at [facility]: we will call you with a date/time for this. Pre-admit testing is located on the first floor of the [facility], [place], [address]. Please bring all prescriptions in the original prescription bottles to your appointment, even if you have reviewed medications by phone with a pharmacy representative. Pre-op labs may be done at your pre-op appointment. You are not required to fast for these labs. Should you need to change your pre-op appointment, please call Pre-admit testing at [phone].     If you have FMLA/disability paperwork, please drop it off or fax it to [phone], attention [place].   We can be reached by phone or mychart 8am-4pm, Monday-Friday. If you have any questions/concerns before or after surgery, you can reach [place] at [phone], or you can send a mychart message. If you have a concern after hours that cannot wait until normal business hours, you can call [phone] and ask to page the neurosurgeon on call for [place].    Appointments/FMLA & disability paperwork: [place]  Nurse: Ophelia Shoulder  Medical assistants: Lum Keas Physician Assistant's: [place] Surgeon: Meade Maw, MD

## 2022-07-14 NOTE — Addendum Note (Signed)
Addended by: Berdine Addison on: 07/14/2022 03:03 PM   Modules accepted: Orders

## 2022-07-16 ENCOUNTER — Telehealth: Payer: Self-pay | Admitting: Neurosurgery

## 2022-07-17 ENCOUNTER — Encounter: Payer: Self-pay | Admitting: Neurosurgery

## 2022-07-17 ENCOUNTER — Other Ambulatory Visit: Payer: Self-pay

## 2022-07-17 DIAGNOSIS — Z01818 Encounter for other preprocedural examination: Secondary | ICD-10-CM

## 2022-07-27 ENCOUNTER — Encounter
Admission: RE | Admit: 2022-07-27 | Discharge: 2022-07-27 | Disposition: A | Discharge status: Home / Self Care | Payer: No Typology Code available for payment source | Source: Ambulatory Visit | Attending: Neurosurgery | Admitting: Neurosurgery

## 2022-07-27 VITALS — BP 115/82 | HR 91 | Resp 16 | Ht 66.0 in | Wt 252.9 lb

## 2022-07-27 DIAGNOSIS — R002 Palpitations: Secondary | ICD-10-CM | POA: Insufficient documentation

## 2022-07-27 DIAGNOSIS — Z01818 Encounter for other preprocedural examination: Secondary | ICD-10-CM

## 2022-07-27 DIAGNOSIS — M5416 Radiculopathy, lumbar region: Secondary | ICD-10-CM | POA: Diagnosis not present

## 2022-07-27 DIAGNOSIS — Z01812 Encounter for preprocedural laboratory examination: Secondary | ICD-10-CM | POA: Insufficient documentation

## 2022-07-27 DIAGNOSIS — M5126 Other intervertebral disc displacement, lumbar region: Secondary | ICD-10-CM | POA: Insufficient documentation

## 2022-07-27 HISTORY — DX: Benign lipomatous neoplasm of skin and subcutaneous tissue of trunk: D17.1

## 2022-07-27 HISTORY — DX: Radiculopathy, lumbar region: M54.16

## 2022-07-27 HISTORY — DX: Obstructive sleep apnea (adult) (pediatric): G47.33

## 2022-07-27 HISTORY — DX: Gastro-esophageal reflux disease without esophagitis: K21.9

## 2022-07-27 HISTORY — DX: Diaphragmatic hernia without obstruction or gangrene: K44.9

## 2022-07-27 HISTORY — DX: Obesity, unspecified: E66.9

## 2022-07-27 HISTORY — DX: Anemia, unspecified: D64.9

## 2022-07-27 HISTORY — DX: Other intervertebral disc displacement, lumbar region: M51.26

## 2022-07-27 HISTORY — DX: Chest pain, unspecified: R07.9

## 2022-07-27 LAB — URINALYSIS, ROUTINE W REFLEX MICROSCOPIC
Bilirubin Urine: NEGATIVE
Glucose, UA: NEGATIVE mg/dL
Hgb urine dipstick: NEGATIVE
Ketones, ur: NEGATIVE mg/dL
Leukocytes,Ua: NEGATIVE
Nitrite: POSITIVE — AB
Protein, ur: NEGATIVE mg/dL
Specific Gravity, Urine: 1.008 (ref 1.005–1.030)
pH: 6 (ref 5.0–8.0)

## 2022-07-27 LAB — BASIC METABOLIC PANEL
Anion gap: 7 (ref 5–15)
BUN: 19 mg/dL (ref 6–20)
CO2: 26 mmol/L (ref 22–32)
Calcium: 9 mg/dL (ref 8.9–10.3)
Chloride: 109 mmol/L (ref 98–111)
Creatinine, Ser: 0.57 mg/dL (ref 0.44–1.00)
GFR, Estimated: >60 mL/min (ref >60)
Glucose, Bld: 91 mg/dL (ref 70–99)
Potassium: 4.4 mmol/L (ref 3.5–5.1)
Sodium: 142 mmol/L (ref 135–145)

## 2022-07-27 LAB — SURGICAL PCR SCREEN
MRSA, PCR: NEGATIVE
Staphylococcus aureus: NEGATIVE

## 2022-07-27 NOTE — Patient Instructions (Addendum)
Your procedure is scheduled on:08-07-22 Friday Report to the Registration Desk on the 1st floor of the [facility].Then proceed to the 2nd floor Surgery Desk To find out your arrival time, please call [phone] between 1PM - 3PM on:08-06-22 Thursday If your arrival time is 6:00 am, do not arrive before that time as the [facility] entrance doors do not open until 6:00 am.  REMEMBER: Instructions that are not followed completely may result in serious medical risk, up to and including death; or upon the discretion of your surgeon and anesthesiologist your surgery may need to be rescheduled.  Do not eat food after midnight the night before surgery.  No gum chewing or hard candies.  You may however, drink CLEAR liquids up to 2 hours before you are scheduled to arrive for your surgery. Do not drink anything within 2 hours of your scheduled arrival time.  Clear liquids include: - water  - apple juice without pulp - gatorade (not RED colors) - black coffee or tea (Do NOT add milk or creamers to the coffee or tea) Do NOT drink anything that is not on this list.  One week prior to surgery:Last dose will be on 07-30-22 Stop Anti-inflammatories (NSAIDS) such as Advil, Aleve, Ibuprofen, Motrin, Naproxen, Naprosyn and Aspirin based products such as Excedrin, Goody's Powder, BC Powder.You may however, take Tylenol if needed for pain up until the day of surgery. You may continue your celecoxib (CELEBREX) up until the day prior to surgery  Stop ANY OVER THE COUNTER supplements/vitamins 7 days prior to surgery   TAKE ONLY THESE MEDICATIONS THE MORNING OF SURGERY WITH A SIP OF WATER: -busPIRone (BUSPAR)  -cetirizine (ZYRTEC)  -DULoxetine (CYMBALTA)   No Alcohol for 24 hours before or after surgery.  No Smoking including e-cigarettes for 24 hours before surgery.  No chewable tobacco products for at least 6 hours before surgery.  No nicotine patches on the day of surgery.  Do not use any  "recreational" drugs for at least a week (preferably 2 weeks) before your surgery.  Please be advised that the combination of cocaine and anesthesia may have negative outcomes, up to and including death. If you test positive for cocaine, your surgery will be cancelled.  On the morning of surgery brush your teeth with toothpaste and water, you may rinse your mouth with mouthwash if you wish. Do not swallow any toothpaste or mouthwash.  Use CHG Soap as directed on instruction sheet.  Do not wear jewelry, make-up, hairpins, clips or nail polish.  Do not wear lotions, powders, or perfumes.   Do not shave body hair from the neck down 48 hours before surgery.  Contact lenses, hearing aids and dentures may not be worn into surgery.  Do not bring valuables to the hospital. [facility] is not responsible for any missing/lost belongings or valuables.   Bring your C-PAP to the hospital   Notify your doctor if there is any change in your medical condition (cold, fever, infection).  Wear comfortable clothing (specific to your surgery type) to the hospital.  After surgery, you can help prevent lung complications by doing breathing exercises.  Take deep breaths and cough every 1-2 hours. Your doctor may order a device called an Incentive Spirometer to help you take deep breaths. When coughing or sneezing, hold a pillow firmly against your incision with both hands. This is called "splinting." Doing this helps protect your incision. It also decreases belly discomfort.  If you are being admitted to the hospital  overnight, leave your suitcase in the car. After surgery it may be brought to your room.  In case of increased patient census, it may be necessary for you, the patient, to continue your postoperative care in the Same Day Surgery department.  If you are being discharged the day of surgery, you will not be allowed to drive home. You will need a responsible individual to drive you home and stay  with you for 24 hours after surgery.   If you are taking public transportation, you will need to have a responsible individual with you.  Please call the [employer] at [phone] if you have any questions about these instructions.  Surgery Visitation Policy:  Patients having surgery or a procedure may have two visitors.  Children under the age of 75 must have an adult with them who is not the patient.     Preparing for Surgery with CHLORHEXIDINE GLUCONATE (CHG) Soap  Chlorhexidine Gluconate (CHG) Soap  o An antiseptic cleaner that kills germs and bonds with the skin to continue killing germs even after washing  o Used for showering the night before surgery and morning of surgery  Before surgery, you can play an important role by reducing the number of germs on your skin.  CHG (Chlorhexidine gluconate) soap is an antiseptic cleanser which kills germs and bonds with the skin to continue killing germs even after washing.  Please do not use if you have an allergy to CHG or antibacterial soaps. If your skin becomes reddened/irritated stop using the CHG.  1. Shower the NIGHT BEFORE SURGERY and the MORNING OF SURGERY with CHG soap.  2. If you choose to wash your hair, wash your hair first as usual with your normal shampoo.  3. After shampooing, rinse your hair and body thoroughly to remove the shampoo.  4. Use CHG as you would any other liquid soap. You can apply CHG directly to the skin and wash gently with a scrungie or a clean washcloth.  5. Apply the CHG soap to your body only from the neck down. Do not use on open wounds or open sores. Avoid contact with your eyes, ears, mouth, and genitals (private parts). Wash face and genitals (private parts) with your normal soap.  6. Wash thoroughly, paying special attention to the area where your surgery will be performed.  7. Thoroughly rinse your body with warm water.  8. Do not shower/wash with your normal soap  after using and rinsing off the CHG soap.  9. Pat yourself dry with a clean towel.  10. Wear clean pajamas to bed the night before surgery.  12. Place clean sheets on your bed the night of your first shower and do not sleep with pets.  13. Shower again with the CHG soap on the day of surgery prior to arriving at the hospital.  14. Do not apply any deodorants/lotions/powders.  15. Please wear clean clothes to the hospital.

## 2022-07-28 DIAGNOSIS — B9620 Unspecified Escherichia coli [E. coli] as the cause of diseases classified elsewhere: Secondary | ICD-10-CM

## 2022-07-28 DIAGNOSIS — Z01812 Encounter for preprocedural laboratory examination: Secondary | ICD-10-CM

## 2022-07-29 LAB — URINE CULTURE: Culture: >=100000 — AB

## 2022-07-29 MED ORDER — NITROFURANTOIN MONOHYD MACRO 100 MG PO CAPS: 100.0000 mg | ORAL_CAPSULE | Freq: Two times a day (BID) | ORAL | 0 refills | Status: AC

## 2022-07-29 NOTE — Progress Notes (Signed)
  [facility] Perioperative Services: Pre-Admission/Anesthesia Testing  Abnormal Lab Notification and Treatment Plan of Care   Date: 07/29/22  Name: Margaret Cuevas MRN:   [id number]  Re: Abnormal labs noted during PAT appointment   Notified:  Provider Name Provider Role Notification Mode  Meade Maw, MD Neurosurgery Routed and/or faxed via [facility]   Abnormal Lab Value(s):   Lab Results  Component Value Date   COLORURINE YELLOW (A) 07/27/2022   APPEARANCEUR CLEAR (A) 07/27/2022   LABSPEC 1.008 07/27/2022   PHURINE 6.0 07/27/2022   GLUCOSEU NEGATIVE 07/27/2022   HGBUR NEGATIVE 07/27/2022   BILIRUBINUR NEGATIVE 07/27/2022   KETONESUR NEGATIVE 07/27/2022   PROTEINUR NEGATIVE 07/27/2022   UROBILINOGEN 0.2 05/13/2022   NITRITE POSITIVE (A) 07/27/2022   LEUKOCYTESUR NEGATIVE 07/27/2022   EPIU 0-5 07/27/2022   WBCU 0-5 07/27/2022   RBCU 0-5 06/27/2020   BACTERIA RARE (A) 07/27/2022   CULT >=100,000 COLONIES/mL ESCHERICHIA COLI (A) 07/27/2022    Clinical Information and Notes:  Patient is scheduled for RIGHT L4-5 FAR LATERAL DISCECTOMY (Right) on 08/07/2022.    UA performed in PAT consistent with/concerning for infection.  No leukocytosis noted on CBC; WBC 5600 Renal function: Estimated Creatinine Clearance: 115.5 mL/min (by C-G formula based on SCr of 0.57 mg/dL). Urine C&S added to assess for pathogenically significant growth.  Impression and Plan:  Margaret Cuevas with a UA that was (+) for infection. Reflex culture sent that grew out pathogenically significant colony counts of E.Coli. Contacted patient to discuss. Patient reporting that she is experiencing back pain, suprapubic pressure, and malodorous urine. Patient with surgery scheduled soon. In efforts to avoid delaying patient's procedure, or have her experience any potentially significant perioperative complications related to the aforementioned, I would like to proceed with treatment for urinary  tract infection.  Allergies reviewed. Culture report also reviewed to ensure culture appropriate coverage is being provided. Will treat with a 5 day course of NITROFURANTOIN. Patient encouraged to complete the entire course of antibiotics even if she begins to feel better.   Meds ordered this encounter  Medications   nitrofurantoin, macrocrystal-monohydrate, (MACROBID) 100 MG capsule    Sig: Take 1 capsule (100 mg total) by mouth 2 (two) times daily for 5 days. Increase water intake while taking this medication.    Dispense:  10 capsule    Refill:  0   Patient encouraged to increase her fluid intake as much as possible. Discussed that water is always best to flush the urinary tract.  May use Tylenol as needed for pain/fever should she experience these symptoms.   Patient instructed to call surgeon's office or PAT with any questions or concerns related to the above outlined course of treatment. Additionally, she was instructed to call if she feels like she is getting worse overall while on treatment. Results and treatment plan of care forwarded to primary attending surgeon to make them aware.   Encounter Diagnoses  Name Primary?   Pre-operative laboratory examination Yes   E. coli UTI (urinary tract infection)    Honor Loh, MSN, APRN, FNP-C, CEN [facility]  Peri-operative Services Nurse Practitioner Phone: [phone] Fax: [phone] 07/29/22 8:15 AM  NOTE: This note has been prepared using Dragon dictation software. Despite my best ability to proofread, there is always the potential that unintentional transcriptional errors may still occur from this process.

## 2022-07-31 ENCOUNTER — Encounter: Payer: Self-pay | Admitting: Neurosurgery

## 2022-08-06 LAB — TYPE AND SCREEN
ABO/RH(D): A POS
Antibody Screen: NEGATIVE

## 2022-08-07 ENCOUNTER — Other Ambulatory Visit: Payer: Self-pay

## 2022-08-07 ENCOUNTER — Ambulatory Visit: Payer: No Typology Code available for payment source | Admitting: Urgent Care

## 2022-08-07 ENCOUNTER — Telehealth: Payer: Self-pay

## 2022-08-07 ENCOUNTER — Ambulatory Visit: Payer: No Typology Code available for payment source

## 2022-08-07 ENCOUNTER — Encounter
Admission: RE | Disposition: A | Discharge status: Home / Self Care | Payer: Self-pay | Source: Home / Self Care | Attending: Neurosurgery

## 2022-08-07 ENCOUNTER — Encounter: Payer: Self-pay | Admitting: Neurosurgery

## 2022-08-07 ENCOUNTER — Ambulatory Visit
Admission: RE | Admit: 2022-08-07 | Discharge: 2022-08-07 | Disposition: A | Discharge status: Home / Self Care | Payer: No Typology Code available for payment source | Attending: Neurosurgery | Admitting: Neurosurgery

## 2022-08-07 DIAGNOSIS — Z791 Long term (current) use of non-steroidal anti-inflammatories (NSAID): Secondary | ICD-10-CM | POA: Insufficient documentation

## 2022-08-07 DIAGNOSIS — M797 Fibromyalgia: Secondary | ICD-10-CM | POA: Insufficient documentation

## 2022-08-07 DIAGNOSIS — Z01812 Encounter for preprocedural laboratory examination: Secondary | ICD-10-CM

## 2022-08-07 DIAGNOSIS — M5116 Intervertebral disc disorders with radiculopathy, lumbar region: Secondary | ICD-10-CM | POA: Insufficient documentation

## 2022-08-07 DIAGNOSIS — M5416 Radiculopathy, lumbar region: Secondary | ICD-10-CM | POA: Diagnosis not present

## 2022-08-07 DIAGNOSIS — F419 Anxiety disorder, unspecified: Secondary | ICD-10-CM | POA: Diagnosis not present

## 2022-08-07 DIAGNOSIS — M48061 Spinal stenosis, lumbar region without neurogenic claudication: Secondary | ICD-10-CM | POA: Insufficient documentation

## 2022-08-07 DIAGNOSIS — Z79899 Other long term (current) drug therapy: Secondary | ICD-10-CM | POA: Diagnosis not present

## 2022-08-07 DIAGNOSIS — G8929 Other chronic pain: Secondary | ICD-10-CM

## 2022-08-07 DIAGNOSIS — F17200 Nicotine dependence, unspecified, uncomplicated: Secondary | ICD-10-CM | POA: Diagnosis not present

## 2022-08-07 DIAGNOSIS — Z01818 Encounter for other preprocedural examination: Secondary | ICD-10-CM

## 2022-08-07 DIAGNOSIS — R8290 Unspecified abnormal findings in urine: Secondary | ICD-10-CM

## 2022-08-07 HISTORY — PX: OTHER SURGICAL HISTORY: SHX169

## 2022-08-07 LAB — ABO/RH: ABO/RH(D): A POS

## 2022-08-07 SURGERY — FAR LATERAL DECOMPRESSION 1 LEVEL
Anesthesia: General | Site: Spine Lumbar | Laterality: Right

## 2022-08-07 MED ORDER — SUCCINYLCHOLINE CHLORIDE 200 MG/10ML IV SOSY
PREFILLED_SYRINGE | INTRAVENOUS | Status: DC | PRN
  Administered 2022-08-07: 100 mg via INTRAVENOUS

## 2022-08-07 MED ORDER — SODIUM CHLORIDE FLUSH 0.9 % IV SOLN
INTRAVENOUS | Status: AC
  Filled 2022-08-07: qty 20

## 2022-08-07 MED ORDER — CHLORHEXIDINE GLUCONATE 0.12 % MT SOLN
OROMUCOSAL | Status: AC
  Filled 2022-08-07: qty 15

## 2022-08-07 MED ORDER — OXYCODONE HCL 5 MG PO TABS
5.0000 mg | ORAL_TABLET | Freq: Once | ORAL | Status: AC
  Administered 2022-08-07: 5 mg via ORAL

## 2022-08-07 MED ORDER — BUPIVACAINE HCL (PF) 0.5 % IJ SOLN
INTRAMUSCULAR | Status: AC
  Filled 2022-08-07: qty 30

## 2022-08-07 MED ORDER — MIDAZOLAM HCL 2 MG/2ML IJ SOLN
INTRAMUSCULAR | Status: DC | PRN
  Administered 2022-08-07: 2 mg via INTRAVENOUS

## 2022-08-07 MED ORDER — SENNA 8.6 MG PO TABS: 1.0000 | ORAL_TABLET | Freq: Every day | ORAL | 0 refills | Status: DC | PRN

## 2022-08-07 MED ORDER — PHENYLEPHRINE 80 MCG/ML (10ML) SYRINGE FOR IV PUSH (FOR BLOOD PRESSURE SUPPORT)
PREFILLED_SYRINGE | INTRAVENOUS | Status: DC | PRN
  Administered 2022-08-07 (×2): 80 ug via INTRAVENOUS
  Administered 2022-08-07 (×2): 160 ug via INTRAVENOUS

## 2022-08-07 MED ORDER — GLYCOPYRROLATE 0.2 MG/ML IJ SOLN
INTRAMUSCULAR | Status: DC | PRN
  Administered 2022-08-07: .2 mg via INTRAVENOUS

## 2022-08-07 MED ORDER — LACTATED RINGERS IV SOLN: INTRAVENOUS | Status: DC

## 2022-08-07 MED ORDER — HYDROMORPHONE HCL 1 MG/ML IJ SOLN
INTRAMUSCULAR | Status: AC
  Filled 2022-08-07: qty 1

## 2022-08-07 MED ORDER — DEXMEDETOMIDINE HCL IN NACL 200 MCG/50ML IV SOLN
INTRAVENOUS | Status: DC | PRN
  Administered 2022-08-07: 20 ug via INTRAVENOUS

## 2022-08-07 MED ORDER — METHYLPREDNISOLONE ACETATE 40 MG/ML IJ SUSP: INTRAMUSCULAR | Status: DC | PRN

## 2022-08-07 MED ORDER — FAMOTIDINE 20 MG PO TABS
ORAL_TABLET | ORAL | Status: AC
  Filled 2022-08-07: qty 1

## 2022-08-07 MED ORDER — ONDANSETRON HCL 4 MG/2ML IJ SOLN: 4.0000 mg | Freq: Once | INTRAMUSCULAR | Status: DC | PRN

## 2022-08-07 MED ORDER — CHLORHEXIDINE GLUCONATE 0.12 % MT SOLN
15.0000 mL | Freq: Once | OROMUCOSAL | Status: AC
  Administered 2022-08-07: 15 mL via OROMUCOSAL

## 2022-08-07 MED ORDER — EPHEDRINE SULFATE (PRESSORS) 50 MG/ML IJ SOLN
INTRAMUSCULAR | Status: DC | PRN
  Administered 2022-08-07: 5 mg via INTRAVENOUS

## 2022-08-07 MED ORDER — HYDROMORPHONE HCL 1 MG/ML IJ SOLN
INTRAMUSCULAR | Status: DC | PRN
  Administered 2022-08-07 (×2): 1 mg via INTRAVENOUS

## 2022-08-07 MED ORDER — BUPIVACAINE-EPINEPHRINE (PF) 0.5% -1:200000 IJ SOLN
INTRAMUSCULAR | Status: DC | PRN
  Administered 2022-08-07: 5 mL

## 2022-08-07 MED ORDER — CEFAZOLIN IN SODIUM CHLORIDE 2-0.9 GM/100ML-% IV SOLN
2.0000 g | Freq: Once | INTRAVENOUS | Status: DC
  Filled 2022-08-07: qty 100

## 2022-08-07 MED ORDER — FENTANYL CITRATE (PF) 100 MCG/2ML IJ SOLN
INTRAMUSCULAR | Status: AC
  Filled 2022-08-07: qty 2

## 2022-08-07 MED ORDER — ACETAMINOPHEN 10 MG/ML IV SOLN
INTRAVENOUS | Status: DC | PRN
  Administered 2022-08-07: 1000 mg via INTRAVENOUS

## 2022-08-07 MED ORDER — SODIUM CHLORIDE (PF) 0.9 % IJ SOLN
INTRAMUSCULAR | Status: DC | PRN
  Administered 2022-08-07: 60 mL via INTRAMUSCULAR

## 2022-08-07 MED ORDER — MIDAZOLAM HCL 2 MG/2ML IJ SOLN
INTRAMUSCULAR | Status: AC
  Filled 2022-08-07: qty 2

## 2022-08-07 MED ORDER — DEXAMETHASONE SODIUM PHOSPHATE 10 MG/ML IJ SOLN
INTRAMUSCULAR | Status: DC | PRN
  Administered 2022-08-07: 10 mg via INTRAVENOUS

## 2022-08-07 MED ORDER — ORAL CARE MOUTH RINSE: 15.0000 mL | Freq: Once | OROMUCOSAL | Status: AC

## 2022-08-07 MED ORDER — METHYLPREDNISOLONE ACETATE 40 MG/ML IJ SUSP
INTRAMUSCULAR | Status: AC
  Filled 2022-08-07: qty 1

## 2022-08-07 MED ORDER — ACETAMINOPHEN 10 MG/ML IV SOLN
INTRAVENOUS | Status: AC
  Filled 2022-08-07: qty 100

## 2022-08-07 MED ORDER — OXYCODONE HCL 5 MG PO TABS: 5.0000 mg | ORAL_TABLET | ORAL | 0 refills | Status: DC | PRN

## 2022-08-07 MED ORDER — FENTANYL CITRATE (PF) 100 MCG/2ML IJ SOLN
INTRAMUSCULAR | Status: DC | PRN
  Administered 2022-08-07 (×2): 50 ug via INTRAVENOUS

## 2022-08-07 MED ORDER — PROPOFOL 10 MG/ML IV BOLUS
INTRAVENOUS | Status: DC | PRN
  Administered 2022-08-07: 200 mg via INTRAVENOUS

## 2022-08-07 MED ORDER — ONDANSETRON HCL 4 MG/2ML IJ SOLN
INTRAMUSCULAR | Status: DC | PRN
  Administered 2022-08-07 (×2): 4 mg via INTRAVENOUS

## 2022-08-07 MED ORDER — LIDOCAINE HCL (CARDIAC) PF 100 MG/5ML IV SOSY
PREFILLED_SYRINGE | INTRAVENOUS | Status: DC | PRN
  Administered 2022-08-07: 100 mg via INTRAVENOUS

## 2022-08-07 MED ORDER — CEFAZOLIN SODIUM-DEXTROSE 2-4 GM/100ML-% IV SOLN
INTRAVENOUS | Status: AC
  Filled 2022-08-07: qty 100

## 2022-08-07 MED ORDER — SURGIFLO WITH THROMBIN (HEMOSTATIC MATRIX KIT) OPTIME
TOPICAL | Status: DC | PRN
  Administered 2022-08-07: 1 via TOPICAL

## 2022-08-07 MED ORDER — CEFAZOLIN SODIUM-DEXTROSE 2-4 GM/100ML-% IV SOLN
2.0000 g | INTRAVENOUS | Status: AC
  Administered 2022-08-07: 2 g via INTRAVENOUS

## 2022-08-07 MED ORDER — BUPIVACAINE LIPOSOME 1.3 % IJ SUSP
INTRAMUSCULAR | Status: AC
  Filled 2022-08-07: qty 20

## 2022-08-07 MED ORDER — BUPIVACAINE-EPINEPHRINE (PF) 0.5% -1:200000 IJ SOLN
INTRAMUSCULAR | Status: AC
  Filled 2022-08-07: qty 30

## 2022-08-07 MED ORDER — FAMOTIDINE 20 MG PO TABS
20.0000 mg | ORAL_TABLET | Freq: Once | ORAL | Status: AC
  Administered 2022-08-07: 20 mg via ORAL

## 2022-08-07 MED ORDER — 0.9 % SODIUM CHLORIDE (POUR BTL) OPTIME
TOPICAL | Status: DC | PRN
  Administered 2022-08-07: 500 mL

## 2022-08-07 MED ORDER — OXYCODONE HCL 5 MG PO TABS
ORAL_TABLET | ORAL | Status: AC
  Filled 2022-08-07: qty 1

## 2022-08-07 MED ORDER — FENTANYL CITRATE (PF) 100 MCG/2ML IJ SOLN
25.0000 ug | INTRAMUSCULAR | Status: DC | PRN
  Administered 2022-08-07 (×4): 25 ug via INTRAVENOUS

## 2022-08-07 SURGICAL SUPPLY — 48 items
ADH SKN CLS APL DERMABOND .7 (GAUZE/BANDAGES/DRESSINGS) ×1
AGENT HMST KT MTR STRL THRMB (HEMOSTASIS) ×1
APL PRP STRL LF DISP 70% ISPRP (MISCELLANEOUS)
BASIN KIT SINGLE STR (MISCELLANEOUS) ×1 IMPLANT
BUR NEURO DRILL SOFT 3.0X3.8M (BURR) ×1 IMPLANT
CHLORAPREP W/TINT 26 (MISCELLANEOUS) ×1 IMPLANT
CNTNR URN SCR LID CUP LEK RST (MISCELLANEOUS) ×1 IMPLANT
CONT SPEC 4OZ STRL OR WHT (MISCELLANEOUS) ×1
DERMABOND ADVANCED .7 DNX12 (GAUZE/BANDAGES/DRESSINGS) ×1 IMPLANT
DRAPE C ARM PK CFD 31 SPINE (DRAPES) ×1 IMPLANT
DRAPE LAPAROTOMY 100X77 ABD (DRAPES) ×1 IMPLANT
DRAPE MICROSCOPE SPINE 48X150 (DRAPES) ×1 IMPLANT
DRAPE SURG 17X11 SM STRL (DRAPES) ×1 IMPLANT
DRSG OPSITE POSTOP 3X4 (GAUZE/BANDAGES/DRESSINGS) IMPLANT
ELECT EZSTD 165MM 6.5IN (MISCELLANEOUS)
ELECT REM PT RETURN 9FT ADLT (ELECTROSURGICAL) ×1
ELECTRODE EZSTD 165MM 6.5IN (MISCELLANEOUS) IMPLANT
ELECTRODE REM PT RTRN 9FT ADLT (ELECTROSURGICAL) ×1 IMPLANT
GLOVE BIOGEL PI IND STRL 6.5 (GLOVE) ×1 IMPLANT
GLOVE BIOGEL PI IND STRL 8.5 (GLOVE) ×2 IMPLANT
GLOVE SURG SYN 6.5 ES PF (GLOVE) ×2 IMPLANT
GLOVE SURG SYN 6.5 PF PI (GLOVE) ×2 IMPLANT
GLOVE SURG SYN 8.5  E (GLOVE) ×3
GLOVE SURG SYN 8.5 E (GLOVE) ×3 IMPLANT
GLOVE SURG SYN 8.5 PF PI (GLOVE) ×3 IMPLANT
GOWN SRG LRG LVL 4 IMPRV REINF (GOWNS) ×1 IMPLANT
GOWN SRG XL LVL 3 NONREINFORCE (GOWNS) ×1 IMPLANT
GOWN STRL NON-REIN TWL XL LVL3 (GOWNS) ×1
GOWN STRL REIN LRG LVL4 (GOWNS) ×1
KIT SPINAL PRONEVIEW (KITS) ×1 IMPLANT
MANIFOLD NEPTUNE II (INSTRUMENTS) ×1 IMPLANT
MARKER SKIN DUAL TIP RULER LAB (MISCELLANEOUS) ×1 IMPLANT
NDL SAFETY ECLIP 18X1.5 (MISCELLANEOUS) ×1 IMPLANT
NS IRRIG 1000ML POUR BTL (IV SOLUTION) ×1 IMPLANT
NS IRRIG 500ML POUR BTL (IV SOLUTION) IMPLANT
PACK LAMINECTOMY NEURO (CUSTOM PROCEDURE TRAY) ×1 IMPLANT
PAD ARMBOARD 7.5X6 YLW CONV (MISCELLANEOUS) ×1 IMPLANT
SURGIFLO W/THROMBIN 8M KIT (HEMOSTASIS) ×1 IMPLANT
SUT DVC VLOC 3-0 CL 6 P-12 (SUTURE) ×1 IMPLANT
SUT VIC AB 0 CT1 27 (SUTURE) ×1
SUT VIC AB 0 CT1 27XCR 8 STRN (SUTURE) ×1 IMPLANT
SUT VIC AB 2-0 CT1 18 (SUTURE) ×1 IMPLANT
SYR 10ML LL (SYRINGE) ×2 IMPLANT
SYR 30ML LL (SYRINGE) ×2 IMPLANT
SYR 3ML LL SCALE MARK (SYRINGE) ×1 IMPLANT
TRAP FLUID SMOKE EVACUATOR (MISCELLANEOUS) ×1 IMPLANT
WATER STERILE IRR 1000ML POUR (IV SOLUTION) ×2 IMPLANT
WATER STERILE IRR 500ML POUR (IV SOLUTION) IMPLANT

## 2022-08-07 NOTE — Telephone Encounter (Signed)
Status on covermymeds states case was closed due to missing information. I called covermymeds and they directed me to call the healthplan at [phone]. I spoke with the healthplan and they informed me that it was closed/denied because they had a paid claim for tramadol that Danielle had prescribed on 07/03/22. I started a new prior auth on the phone and it was approved. Approved through 02/03/23.  I notified [facility] and the patient.

## 2022-08-07 NOTE — Telephone Encounter (Signed)
Authorization for Oxycodone was submitted through Cover My Meds and was sent to [facility] for review.   PA Case ID #: [id number]

## 2022-08-07 NOTE — Transfer of Care (Signed)
Immediate Anesthesia Transfer of Care Note  Patient: Aurellia Vitrano  Procedure(s) Performed: RIGHT L4-5 FAR LATERAL DISCECTOMY (Right: Spine Lumbar)  Patient Location: PACU  Anesthesia Type:General  Level of Consciousness: awake, drowsy, and patient cooperative  Airway & Oxygen Therapy: Patient Spontanous Breathing and Patient connected to face mask oxygen  Post-op Assessment: Report given to RN and Post -op Vital signs reviewed and stable  Post vital signs: Reviewed and stable  Last Vitals:  Vitals Value Taken Time  BP 142/106 08/07/22 1145  Temp 36.4 C 08/07/22 1143  Pulse 99 08/07/22 1150  Resp 22 08/07/22 1150  SpO2 93 % 08/07/22 1150  Vitals shown include unvalidated device data.  Last Pain:  Vitals:   08/07/22 1143  TempSrc:   PainSc: Asleep      Patients Stated Pain Goal: 0 (08/07/22 9480)  Complications: No notable events documented.

## 2022-08-07 NOTE — Op Note (Signed)
Indications: The patient is a 47 yo female who presented with lumbar radiculopathy who failed conservative management.  Findings: far lateral disc herniation at L4/5.  Preoperative Diagnosis: Lumbar radiculopathy Postoperative Diagnosis: same   EBL: 10 ml IVF: see AR ml Drains: none Disposition: Extubated and Stable to PACU Complications: none  No foley catheter was placed.   Preoperative Note:   Risks of surgery discussed include: infection, bleeding, stroke, coma, death, paralysis, CSF leak, nerve/spinal cord injury, numbness, tingling, weakness, complex regional pain syndrome, recurrent stenosis and/or disc herniation, vascular injury, development of instability, neck/back pain, need for further surgery, persistent symptoms, development of deformity, and the risks of anesthesia. The patient understood these risks and agreed to proceed.  Operative Note:   1) Right L4/5 Far lateral discectomy and foraminotomy  The patient was then brought from the preoperative center with intravenous access established.  The patient underwent general anesthesia and endotracheal tube intubation, and was then rotated on the Hartwick rail top where all pressure points were appropriately padded.  The skin was then thoroughly cleansed.  Perioperative antibiotic prophylaxis was administered.  Sterile prep and drapes were then applied and a timeout was then observed.  C-arm was brought into the field under sterile conditions, and the L4/5 disc space identified and marked with an incision on the right ~4cm lateral to midline.   Once this was complete a 2 cm incision was opened with the use of a #10 blade knife.  The Metrx tubes were sequentially advanced under lateral fluoroscopy until a 18 x 80 mm Metrx tube was placed over the right L5 transverse process and secured to the bed.    The microscope was then sterilely brought into the field and muscle creep was hemostased with a bipolar and resected with a  pituitary rongeur.  A Bovie extender was then used to expose the transverse process and lateral facet.  Careful attention was placed to not violate the facet capsule. A 3 mm matchstick drill bit was then used to drill off the top of the transverse process, lateral L4/5 facet, and lateral pars.  The intertransverse membrane was identified, and carefully dissected free from the caudal transverse process.    Using careful dissection, the superior aspect of the L5 pedicle was identified and exposed down to its junction with the vertebral body.  The disc was identified. The disc was entered using a small Penfield 4. The L4 nerve root was identified and freed using a Penfield 4 and balltip probe.  The disc herniation was identified and dissected free using a balltip probe. The pituitary rongeur was used to remove the extruded disc fragments. A Kerrison punch was used to remove residual bony compression from the superior articulating process. Once the nerve root were noted to be relaxed and under less tension the ball-tipped feeler was passed along the foramen proximally to to ensure no residual compression was noted.    Depo-Medrol was placed along the nerve root.  The area was irrigated. The tube system was then removed under microscopic visualization and hemostasis was obtained with a bipolar.    The fascial layer was reapproximated with the use of a 0- Vicryl suture.  Subcutaneous tissue layer was reapproximated using 2-0 Vicryl suture.  3-0 monocryl was used on the skin. The skin was then cleansed and Dermabond was used to close the skin opening.  Patient was then rotated back to the preoperative bed awakened from anesthesia and taken to recovery all counts are correct in this case.  I performed the entire procedure with the assistance of Manning Charity PA as an Designer, television/film set. An assistant was required for this procedure due to the complexity.  The assistant provided assistance in tissue manipulation  and suction, and was required for the successful and safe performance of the procedure. I performed the critical portions of the procedure.    Venetia Night MD

## 2022-08-07 NOTE — Discharge Instructions (Addendum)
Your surgeon has performed an operation on your lumbar spine (low back) to relieve pressure on one or more nerves. Many times, patients feel better immediately after surgery and can "overdo it." Even if you feel well, it is important that you follow these activity guidelines. If you do not let your back heal properly from the surgery, you can increase the chance of a disc herniation and/or return of your symptoms. The following are instructions to help in your recovery once you have been discharged from the hospital.  * It is ok to take NSAIDs after surgery.  Activity    No bending, lifting, or twisting ("BLT"). Avoid lifting objects heavier than 10 pounds (gallon milk jug).  Where possible, avoid household activities that involve lifting, bending, pushing, or pulling such as laundry, vacuuming, grocery shopping, and childcare. Try to arrange for help from friends and family for these activities while your back heals.  Increase physical activity slowly as tolerated.  Taking short walks is encouraged, but avoid strenuous exercise. Do not jog, run, bicycle, lift weights, or participate in any other exercises unless specifically allowed by your doctor. Avoid prolonged sitting, including car rides.  Talk to your doctor before resuming sexual activity.  You should not drive until cleared by your doctor.  Until released by your doctor, you should not return to work or school.  You should rest at home and let your body heal.   You may shower three days after your surgery.  After showering, lightly dab your incision dry. Do not take a tub bath or go swimming for 3 weeks, or until approved by your doctor at your follow-up appointment.  If you smoke, we strongly recommend that you quit.  Smoking has been proven to interfere with normal healing in your back and will dramatically reduce the success rate of your surgery. Please contact [facility] (800-QUIT-NOW) and use the resources at www.[facility].com for  assistance in stopping smoking.  Surgical Incision   If you have a dressing on your incision, you may remove it three days after your surgery. Keep your incision area clean and dry.  Your incision was closed with Dermabond glue. The glue should begin to peel away within about a week Diet            You may return to your usual diet. Be sure to stay hydrated.  When to Contact us  Although your surgery and recovery will likely be uneventful, you may have some residual numbness, aches, and pains in your back and/or legs. This is normal and should improve in the next few weeks.  However, should you experience any of the following, contact us immediately: New numbness or weakness Pain that is progressively getting worse, and is not relieved by your pain medications or rest Bleeding, redness, swelling, pain, or drainage from surgical incision Chills or flu-like symptoms Fever greater than 101.0 F (38.3 C) Problems with bowel or bladder functions Difficulty breathing or shortness of breath Warmth, tenderness, or swelling in your calf  Contact Information During office hours (Monday-Friday 9 am to 5 pm), please call your physician at [phone] and ask for Sharlot Gowda After hours and weekends, please call [phone] and speak with the neurosurgeon on call For a life-threatening emergency, call 911  AMBULATORY SURGERY  DISCHARGE INSTRUCTIONS   The drugs that you were given will stay in your system until tomorrow so for the next 24 hours you should not:  Drive an automobile Make any legal decisions Drink any alcoholic beverage  You may resume regular meals tomorrow.  Today it is better to start with liquids and gradually work up to solid foods.  You may eat anything you prefer, but it is better to start with liquids, then soup and crackers, and gradually work up to solid foods.   Please notify your doctor immediately if you have any unusual bleeding, trouble breathing,  redness and pain at the surgery site, drainage, fever, or pain not relieved by medication.    Additional Instructions: PLEASE LEAVE GREEN/TEAL BRACELET ON FOR 4 DAYS        Please contact your physician with any problems or Same Day Surgery at [phone], Monday through Friday 6 am to 4 pm, or [place] at [facility] number at [phone].

## 2022-08-07 NOTE — Anesthesia Postprocedure Evaluation (Signed)
Anesthesia Post Note  Patient: Margaret Cuevas  Procedure(s) Performed: RIGHT L4-5 FAR LATERAL DISCECTOMY (Right: Spine Lumbar)  Patient location during evaluation: PACU Anesthesia Type: General Level of consciousness: awake and awake and alert Pain management: satisfactory to patient Vital Signs Assessment: post-procedure vital signs reviewed and stable Respiratory status: spontaneous breathing and nonlabored ventilation Cardiovascular status: stable Anesthetic complications: no   No notable events documented.   Last Vitals:  Vitals:   08/07/22 1230 08/07/22 1245  BP: (!) 135/95 (!) 134/91  Pulse: 81 95  Resp: 12 16  Temp:    SpO2: 92% 94%    Last Pain:  Vitals:   08/07/22 1245  TempSrc:   PainSc: 7                  VAN STAVEREN,Jalexis Breed

## 2022-08-07 NOTE — Anesthesia Preprocedure Evaluation (Signed)
Anesthesia Evaluation  Patient identified by MRN, date of birth, ID band Patient awake    Reviewed: Allergy & Precautions, NPO status , Patient's Chart, lab work & pertinent test results  Airway Mallampati: II  TM Distance: >3 FB Neck ROM: full    Dental  (+) Teeth Intact   Pulmonary neg pulmonary ROS, sleep apnea , Current Smoker   Pulmonary exam normal breath sounds clear to auscultation       Cardiovascular Exercise Tolerance: Good negative cardio ROS Normal cardiovascular exam Rhythm:Regular Rate:Normal     Neuro/Psych   Anxiety     negative neurological ROS  negative psych ROS   GI/Hepatic negative GI ROS, Neg liver ROS, hiatal hernia,GERD  Medicated,,  Endo/Other  negative endocrine ROS  Morbid obesity  Renal/GU negative Renal ROS  negative genitourinary   Musculoskeletal   Abdominal  (+) + obese  Peds negative pediatric ROS (+)  Hematology negative hematology ROS (+) Blood dyscrasia, anemia   Anesthesia Other Findings Past Medical History: No date: Anemia No date: Anxiety No date: Chest pain No date: Fibromyalgia No date: GERD (gastroesophageal reflux disease) No date: Hiatal hernia 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 date: Lipoma of back No date: Lumbar disc herniation No date: Lumbar radiculopathy No date: Obesity No date: OSA on CPAP  Past Surgical History: No date: ABDOMINAL HYSTERECTOMY No date: ANKLE FRACTURE SURGERY     Comment:  plates and screws placed on both sides No date: APPENDECTOMY No date: BACK SURGERY     Comment:  lumbar L4-5 No date: CESAREAN SECTION No date: CHOLECYSTECTOMY 05/04/2022: COLONOSCOPY; N/A     Comment:  Procedure: COLONOSCOPY;  Surgeon: Jaynie Collins,              DO;  Location: [facility] ENDOSCOPY;  Service:               Gastroenterology;  Laterality: N/A; No date: EAR TUBE REMOVAL 05/04/2022: ESOPHAGOGASTRODUODENOSCOPY; N/A     Comment:   Procedure: ESOPHAGOGASTRODUODENOSCOPY (EGD);  Surgeon:               Jaynie Collins, DO;  Location: [facility] ENDOSCOPY;                Service: Gastroenterology;  Laterality: N/A; 2006: GASTRIC BYPASS No date: ORIF FINGER / THUMB FRACTURE     Comment:  screws in thumb     Reproductive/Obstetrics negative OB ROS                             Anesthesia Physical Anesthesia Plan  ASA: 2  Anesthesia Plan: General ETT   Post-op Pain Management:    Induction:   PONV Risk Score and Plan: 1 and Ondansetron and Dexamethasone  Airway Management Planned: Oral ETT  Additional Equipment:   Intra-op Plan:   Post-operative Plan: Extubation in OR  Informed Consent: I have reviewed the patients History and Physical, chart, labs and discussed the procedure including the risks, benefits and alternatives for the proposed anesthesia with the patient or authorized representative who has indicated his/her understanding and acceptance.     Dental Advisory Given  Plan Discussed with: CRNA and Surgeon  Anesthesia Plan Comments:        Anesthesia Quick Evaluation

## 2022-08-07 NOTE — Interval H&P Note (Signed)
History and Physical Interval Note:  08/07/2022 9:04 AM  Margaret Cuevas  has presented today for surgery, with the diagnosis of M54.16 lumbar radiculopathy.  The various methods of treatment have been discussed with the patient and family. After consideration of risks, benefits and other options for treatment, the patient has consented to  Procedure(s): RIGHT L4-5 FAR LATERAL DISCECTOMY (Right) as a surgical intervention.  The patient's history has been reviewed, patient examined, no change in status, stable for surgery.  I have reviewed the patient's chart and labs.  Questions were answered to the patient's satisfaction.    Heart sounds normal no MRG. Chest Clear to Auscultation Bilaterally.   Tammie Yanda

## 2022-08-07 NOTE — Anesthesia Procedure Notes (Signed)
Procedure Name: Intubation Date/Time: 08/07/2022 9:41 AM  Performed by: Mohammed Kindle, CRNAPre-anesthesia Checklist: Patient identified, Emergency Drugs available, Suction available and Patient being monitored Patient Re-evaluated:Patient Re-evaluated prior to induction Oxygen Delivery Method: Circle system utilized Preoxygenation: Pre-oxygenation with 100% oxygen Induction Type: IV induction Ventilation: Mask ventilation without difficulty Laryngoscope Size: McGraph and 3 Grade View: Grade I Tube type: Oral Tube size: 7.0 mm Number of attempts: 1 Airway Equipment and Method: Stylet and Oral airway Placement Confirmation: ETT inserted through vocal cords under direct vision, positive ETCO2, breath sounds checked- equal and bilateral and CO2 detector Secured at: 21 cm Tube secured with: Tape Dental Injury: Teeth and Oropharynx as per pre-operative assessment

## 2022-08-11 ENCOUNTER — Encounter: Payer: Self-pay | Admitting: Neurosurgery

## 2022-08-11 ENCOUNTER — Encounter: Payer: Self-pay | Admitting: Family Medicine

## 2022-08-11 MED ORDER — GABAPENTIN 300 MG PO CAPS: 300.0000 mg | ORAL_CAPSULE | Freq: Three times a day (TID) | ORAL | 0 refills | Status: DC

## 2022-08-13 ENCOUNTER — Other Ambulatory Visit: Payer: Self-pay

## 2022-08-13 DIAGNOSIS — E669 Obesity, unspecified: Secondary | ICD-10-CM

## 2022-08-13 MED ORDER — METHYLPREDNISOLONE 4 MG PO TBPK: ORAL_TABLET | ORAL | 0 refills | Status: DC

## 2022-08-13 NOTE — Progress Notes (Signed)
Ref placed to weight management

## 2022-08-20 ENCOUNTER — Encounter: Payer: Self-pay | Admitting: Neurosurgery

## 2022-08-20 ENCOUNTER — Encounter: Payer: No Typology Code available for payment source | Admitting: Neurosurgery

## 2022-08-20 ENCOUNTER — Ambulatory Visit: Payer: No Typology Code available for payment source | Admitting: Neurosurgery

## 2022-08-20 ENCOUNTER — Ambulatory Visit
Admission: RE | Admit: 2022-08-20 | Discharge: 2022-08-20 | Disposition: A | Discharge status: Home / Self Care | Payer: No Typology Code available for payment source | Source: Ambulatory Visit | Attending: Neurosurgery | Admitting: Neurosurgery

## 2022-08-20 VITALS — BP 128/86 | Temp 98.7°F | Ht 66.0 in | Wt 252.0 lb

## 2022-08-20 DIAGNOSIS — M79604 Pain in right leg: Secondary | ICD-10-CM

## 2022-08-20 MED ORDER — OXYCODONE HCL 5 MG PO TABS: 5.0000 mg | ORAL_TABLET | ORAL | 0 refills | Status: AC | PRN

## 2022-08-20 NOTE — Progress Notes (Signed)
   REFERRING PHYSICIAN:  [name], Md [address] [place],  [place] [address]  DOS: 08/07/22 right L4-5 far lateral discectomy  HISTORY OF PRESENT ILLNESS: Margaret Cuevas is approximately weeks status post far lateral discectomy. she is doing fair postoperatively.  She states she was doing well until a couple of days ago when she started having recurrent sharp radiating leg pain.  She now has numbness on the lateral aspect of her right lower leg intense pain behind her knee which started yesterday.  Overall she states she is better than she was before surgery however the recurrent severe pain is concerning to her.  She is currently taking Celebrex 100 mg twice a day, Flexeril 10 mg, and gabapentin 300 mg 3 times daily.  She has completed a steroid taper and continues to take oxycodone as needed.  PHYSICAL EXAMINATION:  General: Patient is well developed, well nourished, calm, collected, and in no apparent distress.   NEUROLOGICAL:  General: In no acute distress.   Awake, alert, oriented to person, place, and time.  Pupils equal round and reactive to light.  Facial tone is symmetric.  Tongue protrusion is midline.  There is no pronator drift.   Strength:            Side Iliopsoas Quads Hamstring PF DF EHL  R L Incision c/d/I and healing well   ROS (Neurologic):  Negative except as noted above  IMAGING: No interval imaging to review  ASSESSMENT/PLAN:  Margaret Cuevas is doing fair approximately 2 weeks after far lateral lumbar discectomy.  We discussed in detail that some of the symptoms are not abnormal after this type of surgery and should subside get in time.  I am encouraged that she is somewhat better than she was before surgery.  We discussed appropriate medication use and I recommended that she pull back on some of her activity.  Given the pain behind her knee and questionable Denna Haggard' sign on exam, I have ordered a right lower extremity  ultrasound although my suspicion for DVT is low.  I have refilled her oxycodone as requested.  We discussed potentially repeating a Medrol Dosepak versus increasing her gabapentin however she would not like to make any medication changes at this time.  We discussed activity escalation and I have advised the patient to lift up to 10 pounds until 6 weeks after surgery, then increase up to 25 pounds until 12 weeks after surgery.  After 12 weeks post-op, the patient advised to increase activity as tolerated. Contact her with the results of her DVT study.  she will follow up with Dr. Myer Haff in 4 weeks or sooner should she have any questions or concerns.  She expressed understanding was in agreement with this plan.  Advised to contact the office if any questions or concerns arise.  Manning Charity PA-C Department of neurosurgery

## 2022-08-24 ENCOUNTER — Other Ambulatory Visit: Payer: Self-pay | Admitting: Neurosurgery

## 2022-08-24 MED ORDER — NORTRIPTYLINE HCL 10 MG PO CAPS: 30.0000 mg | ORAL_CAPSULE | Freq: Every day | ORAL | 0 refills | Status: DC

## 2022-08-24 NOTE — Progress Notes (Signed)
Provide one time prescription

## 2022-08-26 DIAGNOSIS — M5416 Radiculopathy, lumbar region: Secondary | ICD-10-CM

## 2022-08-27 MED ORDER — METHYLPREDNISOLONE 4 MG PO TBPK: ORAL_TABLET | ORAL | 0 refills | Status: DC

## 2022-09-09 ENCOUNTER — Other Ambulatory Visit: Payer: Self-pay | Admitting: Family Medicine

## 2022-09-09 DIAGNOSIS — N63 Unspecified lump in unspecified breast: Secondary | ICD-10-CM

## 2022-09-10 MED ORDER — OXYCODONE HCL 5 MG PO TABS: 5.0000 mg | ORAL_TABLET | ORAL | 0 refills | Status: DC | PRN

## 2022-09-10 NOTE — Addendum Note (Signed)
Addended by: Adelene Amas on: 09/10/2022 03:02 PM   Modules accepted: Orders

## 2022-09-11 MED ORDER — GABAPENTIN 600 MG PO TABS: 600.0000 mg | ORAL_TABLET | Freq: Three times a day (TID) | ORAL | 0 refills | Status: DC

## 2022-09-11 NOTE — Addendum Note (Signed)
Addended by: Adelene Amas on: 09/11/2022 10:24 AM   Modules accepted: Orders

## 2022-09-15 ENCOUNTER — Ambulatory Visit (INDEPENDENT_AMBULATORY_CARE_PROVIDER_SITE_OTHER): Payer: No Typology Code available for payment source | Admitting: Neurosurgery

## 2022-09-15 ENCOUNTER — Encounter: Payer: Self-pay | Admitting: Neurosurgery

## 2022-09-15 VITALS — BP 130/78 | Ht 66.0 in | Wt 252.0 lb

## 2022-09-15 DIAGNOSIS — Z09 Encounter for follow-up examination after completed treatment for conditions other than malignant neoplasm: Secondary | ICD-10-CM

## 2022-09-15 DIAGNOSIS — M5416 Radiculopathy, lumbar region: Secondary | ICD-10-CM

## 2022-09-15 NOTE — Progress Notes (Addendum)
   REFERRING PHYSICIAN:  [name], Md [address] [place],  [place] [address]  DOS: 08/07/22 right L4-5 far lateral discectomy  HISTORY OF PRESENT ILLNESS: Margaret Cuevas is status post far lateral discectomy.  She is having pain in her back as well as discomfort in both of her buttocks.  She is having some new discomfort down the back of her right leg that was not present prior to surgery.  She is also having some numbness in the front of her right calf.  Overall, she has had a very difficult time postoperatively.  We recently increased her gabapentin to 600 mg 3 times a day last Friday.  She has not seen a significant increase in relief from that.   PHYSICAL EXAMINATION:  General: Patient is well developed, well nourished, calm, collected, and in no apparent distress.   NEUROLOGICAL:  General: In no acute distress.   Awake, alert, oriented to person, place, and time.  Pupils equal round and reactive to light.  Facial tone is symmetric.  Tongue protrusion is midline.  There is no pronator drift.   Strength:            Side Iliopsoas Quads Hamstring PF DF EHL  R 4+ 4+ 5 4+ 5 5  L 5 5 5 5 5 5    Incision c/d/I and healing well   ROS (Neurologic):  Negative except as noted above  IMAGING: No interval imaging to review  ASSESSMENT/PLAN:  Guillermina Cuevas is doing poorly after far lateral discectomy.  She has some new symptoms that suggest possible S1 involvement.  She additionally has some bilateral buttock and back pain that were not present prior to surgery.  I am concerned that she may have a new issue, but would like to evaluate the postoperative area to determine whether she has suffered a recurrent disc herniation.  It is possible that her fibromyalgia may be complicating her postoperative recovery.  If her symptoms do not improve by Tuesday, I would like to increase her gabapentin to 900 mg 3 times daily to see if this will help with her pain.  I would also like  to arrange for an epidural steroid injection to see if that would decrease her inflammation.  Will repeat her MRI scan to help with guidance on her injections.  If nothing is seen on her MRI scan, we may have to consider nerve conduction study to see whether she has had a transition to a chronic radiculopathy.  She is not ready to return to work.  She still having significant pain.  Venetia Night Department of neurosurgery

## 2022-09-19 ENCOUNTER — Ambulatory Visit
Admission: RE | Admit: 2022-09-19 | Discharge: 2022-09-19 | Disposition: A | Discharge status: Home / Self Care | Payer: No Typology Code available for payment source | Source: Ambulatory Visit | Attending: Neurosurgery | Admitting: Neurosurgery

## 2022-09-19 DIAGNOSIS — M5416 Radiculopathy, lumbar region: Secondary | ICD-10-CM | POA: Diagnosis present

## 2022-09-19 MED ORDER — GADOBUTROL 1 MMOL/ML IV SOLN
10.0000 mL | Freq: Once | INTRAVENOUS | Status: AC | PRN
  Administered 2022-09-19: 10 mL via INTRAVENOUS

## 2022-09-22 ENCOUNTER — Encounter: Payer: No Typology Code available for payment source | Admitting: Neurosurgery

## 2022-09-22 ENCOUNTER — Ambulatory Visit (INDEPENDENT_AMBULATORY_CARE_PROVIDER_SITE_OTHER): Payer: No Typology Code available for payment source | Admitting: Family Medicine

## 2022-09-22 ENCOUNTER — Telehealth: Payer: Self-pay

## 2022-09-22 VITALS — BP 124/78 | HR 87 | Ht 66.0 in | Wt 265.0 lb

## 2022-09-22 DIAGNOSIS — R631 Polydipsia: Secondary | ICD-10-CM | POA: Diagnosis not present

## 2022-09-22 DIAGNOSIS — H539 Unspecified visual disturbance: Secondary | ICD-10-CM

## 2022-09-22 DIAGNOSIS — R61 Generalized hyperhidrosis: Secondary | ICD-10-CM | POA: Diagnosis not present

## 2022-09-22 DIAGNOSIS — N951 Menopausal and female climacteric states: Secondary | ICD-10-CM

## 2022-09-22 DIAGNOSIS — E162 Hypoglycemia, unspecified: Secondary | ICD-10-CM | POA: Diagnosis not present

## 2022-09-22 DIAGNOSIS — R2231 Localized swelling, mass and lump, right upper limb: Secondary | ICD-10-CM

## 2022-09-22 DIAGNOSIS — M5416 Radiculopathy, lumbar region: Secondary | ICD-10-CM

## 2022-09-22 LAB — HEMOGLOBIN A1C: Hemoglobin A1C: 6

## 2022-09-22 LAB — BASIC METABOLIC PANEL: Glucose: 87

## 2022-09-22 MED ORDER — GABAPENTIN 300 MG PO CAPS: 900.0000 mg | ORAL_CAPSULE | Freq: Three times a day (TID) | ORAL | 1 refills | Status: DC

## 2022-09-22 NOTE — Telephone Encounter (Signed)
Referral faxed again.

## 2022-09-22 NOTE — Telephone Encounter (Signed)
Gabapentin required authorization through Cover My Meds. This is pending.   PA Case ID #: [id number]

## 2022-09-22 NOTE — Telephone Encounter (Signed)
-----   Message from Venetia Night, MD sent at 09/15/2022  4:30 PM EDT ----- Please touch base with her regarding symptoms.  If she is not feeling better, okay to increase dose to 900 mg 3 times daily for gabapentin  Thanks, [employer]

## 2022-09-22 NOTE — Progress Notes (Signed)
Date:  09/22/2022   Name:  Margaret Cuevas   DOB:  [birth year]   MRN:  [id number]   Chief Complaint: Polydipsia (Wants A1C drawn for excessive thirst and blurred vision)  Patient states she has recently been increased bothered by discomfort that awakes her of the right forearm mid medial aspect which has either a palpable lymph node of normal size/normal consistency or lipoma.  This is nontender.  No other lymph nodes were noted in the epitrochlear.       Patient also brings up today since she has had surgery and has had time and in the past she has neglected taking care of herself that she is being more observant of symptoms and would like to have things evaluated prior to returning to work.  Diabetes Diabetes visit type: screening for polydipsia and blurred vision. Associated symptoms include blurred vision, polydipsia and visual change. Pertinent negatives for diabetes include no chest pain and no fatigue. Diabetic current diet: no diet now. When asked about meal planning, she reported none. Exercise: unable.    Lab Results  Component Value Date   NA 142 07/27/2022   K 4.4 07/27/2022   CO2 26 07/27/2022   GLUCOSE 91 07/27/2022   BUN 19 07/27/2022   CREATININE 0.57 07/27/2022   CALCIUM 9.0 07/27/2022   EGFR 101 11/14/2021   GFRNONAA >60 07/27/2022   Lab Results  Component Value Date   CHOL 191 11/14/2021   HDL 79 11/14/2021   LDLCALC 99 11/14/2021   TRIG 71 11/14/2021   Lab Results  Component Value Date   TSH 2.650 12/31/2020   Lab Results  Component Value Date   HGBA1C 5.8 (H) 01/08/2021   Lab Results  Component Value Date   WBC 5.6 03/25/2022   HGB 12.5 03/25/2022   HCT 38.3 03/25/2022   MCV 87.2 03/25/2022   PLT 239 03/25/2022   Lab Results  Component Value Date   ALT 15 06/27/2020   AST 19 06/27/2020   ALKPHOS 55 06/27/2020   BILITOT 0.5 06/27/2020   No results found for: "25OHVITD2", "25OHVITD3", "VD25OH"   Review of Systems  Constitutional:  Negative  for fatigue.  HENT:  Negative for trouble swallowing.   Eyes:  Positive for blurred vision and visual disturbance.  Respiratory:  Negative for shortness of breath.   Cardiovascular:  Negative for chest pain and palpitations.  Gastrointestinal:  Negative for constipation.  Endocrine: Positive for polydipsia.  Genitourinary:  Negative for difficulty urinating.    Patient Active Problem List   Diagnosis Date Noted   Lumbar radiculopathy 08/07/2022   OSA on CPAP 05/07/2021   Anxiety, generalized 05/07/2021   H/O total hysterectomy 07/15/2020   History of appendectomy 07/15/2020   Lipoma of back 11/15/2018   Fibromyalgia 10/25/2017   Influenza vaccine needed 01/20/2017    Allergies  Allergen Reactions   Ibuprofen Other (See Comments)    Motrin makes her throat swell. Can take ibuprofen    Past Surgical History:  Procedure Laterality Date   ABDOMINAL HYSTERECTOMY     ANKLE FRACTURE SURGERY     plates and screws placed on both sides   APPENDECTOMY     BACK SURGERY     lumbar L4-5   CESAREAN SECTION     CHOLECYSTECTOMY     COLONOSCOPY N/A 05/04/2022   Procedure: COLONOSCOPY;  Surgeon: Jaynie Collins, DO;  Location: [facility] ENDOSCOPY;  Service: Gastroenterology;  Laterality: N/A;   EAR TUBE REMOVAL     ESOPHAGOGASTRODUODENOSCOPY N/A  05/04/2022   Procedure: ESOPHAGOGASTRODUODENOSCOPY (EGD);  Surgeon: Jaynie Collins, DO;  Location: [facility] ENDOSCOPY;  Service: Gastroenterology;  Laterality: N/A;   GASTRIC BYPASS  2006   ORIF FINGER / THUMB FRACTURE     screws in thumb    Social History   Tobacco Use   Smoking status: Some Days    Types: Cigarettes   Smokeless tobacco: Never   Tobacco comments:    social  Vaping Use   Vaping Use: Never used  Substance Use Topics   Alcohol use: Yes    Comment: social   Drug use: No     Medication list has been reviewed and updated.  No outpatient medications have been marked as taking for the 09/22/22 encounter (Office  Visit) with [name], MD.       09/22/2022    9:04 AM 05/13/2022    8:07 AM 11/13/2021    4:06 PM 10/13/2021    8:04 AM  GAD 7 : Generalized Anxiety Score  Nervous, Anxious, on Edge 0 0 2 2  Control/stop worrying 0 0 1 1  Worry too much - different things 0 0 1 0  Trouble relaxing 0 0 2 0  Restless 0 0 1 0  Easily annoyed or irritable 0 0 2 3  Afraid - awful might happen 0 0 3 0  Total GAD 7 Score 0 0 12 6  Anxiety Difficulty Not difficult at all Not difficult at all Very difficult Somewhat difficult       09/22/2022    9:03 AM 05/13/2022    8:07 AM 11/13/2021    4:06 PM  Depression screen PHQ 2/9  Decreased Interest 0 0 2  Down, Depressed, Hopeless 0 0 2  PHQ - 2 Score 0 0 4  Altered sleeping 0 0 1  Tired, decreased energy 0 0 3  Change in appetite 0 0 1  Feeling bad or failure about yourself  0 0 1  Trouble concentrating 0 0 1  Moving slowly or fidgety/restless 0 0 1  Suicidal thoughts 0 0 0  PHQ-9 Score 0 0 12  Difficult doing work/chores Not difficult at all Not difficult at all Very difficult    BP Readings from Last 3 Encounters:  09/22/22 124/78  09/15/22 130/78  08/20/22 128/86    Physical Exam HENT:     Right Ear: Tympanic membrane and ear canal normal.     Left Ear: Tympanic membrane and ear canal normal.     Nose: Nose normal. No congestion or rhinorrhea.     Mouth/Throat:     Mouth: Mucous membranes are moist.  Cardiovascular:     Heart sounds: No murmur heard.    No friction rub. No gallop.  Pulmonary:     Breath sounds: No wheezing, rhonchi or rales.  Lymphadenopathy:     Head:     Right side of head: No submental, submandibular or tonsillar adenopathy.     Left side of head: No submental, submandibular or tonsillar adenopathy.     Cervical:     Right cervical: No superficial, deep or posterior cervical adenopathy.    Left cervical: No superficial or deep cervical adenopathy.     Upper Body:     Right upper body: No epitrochlear  adenopathy.     Left upper body: No epitrochlear adenopathy.     Comments: Right  midmedial flexor lymph node or lipoma  Skin:    Findings: No erythema or wound.     Comments:  No sign of any infection of the right hand which may be inflaming lymph node.     Wt Readings from Last 3 Encounters:  09/22/22 265 lb (120.2 kg)  09/15/22 252 lb (114.3 kg)  08/20/22 252 lb (114.3 kg)    BP 124/78   Pulse 87   Ht 5\' 6"  (1.676 m)   Wt 265 lb (120.2 kg)   SpO2 97%   BMI 42.77 kg/m   Assessment and Plan: 1. Hypoglycemia Patient has noted this by her husband's glucometer over the past.  Blood sugar readings are in the 40-60 range.  Patient has concerns for diabetes or other possibilities of diagnostic reasons for elevations/decreased glucose.  Given that she has had increasing polydipsia and visual disturbance we will check an A1c and patient is fasting and we will check glucose.  Given that she has had repeated low blood sugars of her her designation we will check an insulin free and total. - HgB A1c - Glucose - Insulin, Free and Total  2. Polydipsia Patient with excessive thirst with dry mouth and would like to check for diabetes. - HgB A1c - Glucose  3. Visual disturbance Patient with recent visual disturbance but has had a retinal exam apparently by her optometrist/ophthalmologist.  Will check A1c glucose. - HgB A1c - Glucose  4. Diaphoresis Patient is concerned that diaphoresis was the reason for her glucose concerns.  5. Vasomotor symptoms due to menopause Patient has vasomotor symptoms for which she is followed by OB/GYN with estradiol patch.  6. Nodule of skin of right forearm New onset.  Persistent.  Now causing discomfort at night.  Patient would like to have this evaluated and that she is becoming more observant of concerns since she has been out from surgery.  We will refer for ultrasound of the soft tissue to determine whether it is lipoma or a node in nature no other  lymph nodes were noticed in the forearm as well is cervical. - [place] RT UPPER EXTREM LTD SOFT TISSUE NON VASCULAR     Elizabeth Sauer, MD

## 2022-09-23 NOTE — Telephone Encounter (Signed)
Request is still pending with Cover[employer]eds

## 2022-09-23 NOTE — Telephone Encounter (Signed)
Authorization is still pending.

## 2022-09-24 NOTE — Telephone Encounter (Signed)
Spoke with a representative at [facility] and she stated that she was able to override the system and told me this was approved and is valid from 09/24/22 to 10/24/22.   I have faxed this to the pharmacy.

## 2022-09-24 NOTE — Telephone Encounter (Signed)
This is still pending on cover my meds, I attempted to call [facility] and they are on CST which is 7:36am.  They don't open until 8am, I will call back after they open.   Phone # [phone]

## 2022-09-25 ENCOUNTER — Ambulatory Visit
Admission: RE | Admit: 2022-09-25 | Discharge: 2022-09-25 | Disposition: A | Discharge status: Home / Self Care | Payer: No Typology Code available for payment source | Source: Ambulatory Visit | Attending: Family Medicine | Admitting: Family Medicine

## 2022-09-25 DIAGNOSIS — N63 Unspecified lump in unspecified breast: Secondary | ICD-10-CM | POA: Insufficient documentation

## 2022-09-25 DIAGNOSIS — R2231 Localized swelling, mass and lump, right upper limb: Secondary | ICD-10-CM | POA: Insufficient documentation

## 2022-09-28 LAB — INSULIN, FREE AND TOTAL
Free Insulin: 3.9 uU/mL
Total Insulin: 3.9 uU/mL

## 2022-09-28 LAB — GLUCOSE, RANDOM: Glucose: 87 mg/dL (ref 70–99)

## 2022-09-28 LAB — HEMOGLOBIN A1C
Est. average glucose Bld gHb Est-mCnc: 126 mg/dL
Hgb A1c MFr Bld: 6 % — ABNORMAL HIGH (ref 4.8–5.6)

## 2022-09-28 NOTE — Telephone Encounter (Signed)
10/14/2022 

## 2022-09-29 NOTE — Addendum Note (Signed)
Addended by: Sharlot Gowda on: 09/29/2022 08:32 AM   Modules accepted: Orders

## 2022-09-30 ENCOUNTER — Encounter: Payer: Self-pay | Admitting: Family Medicine

## 2022-09-30 DIAGNOSIS — M5126 Other intervertebral disc displacement, lumbar region: Secondary | ICD-10-CM | POA: Diagnosis not present

## 2022-09-30 DIAGNOSIS — M5416 Radiculopathy, lumbar region: Secondary | ICD-10-CM | POA: Diagnosis not present

## 2022-09-30 MED ORDER — OXYCODONE HCL 5 MG PO TABS
5.0000 mg | ORAL_TABLET | Freq: Four times a day (QID) | ORAL | 0 refills | Status: DC | PRN
Start: 2022-09-30 — End: 2022-10-19

## 2022-09-30 NOTE — Addendum Note (Signed)
Addended byDrake Leach on: 09/30/2022 12:08 PM   Modules accepted: Orders

## 2022-09-30 NOTE — Telephone Encounter (Signed)
DOS: 08/07/22 right L4-5 far lateral discectomy   Had increased/new pain postop. MRI showed postop changes.  Had ESI today.   Okay for limited refill of oxycodone. PMP reviewed and is appropriate.

## 2022-10-12 ENCOUNTER — Encounter: Payer: Self-pay | Admitting: Family Medicine

## 2022-10-12 ENCOUNTER — Other Ambulatory Visit: Payer: Self-pay

## 2022-10-12 ENCOUNTER — Encounter: Payer: Self-pay | Admitting: Neurosurgery

## 2022-10-12 DIAGNOSIS — R2231 Localized swelling, mass and lump, right upper limb: Secondary | ICD-10-CM

## 2022-10-12 NOTE — Progress Notes (Signed)
Ref to surgery

## 2022-10-14 ENCOUNTER — Ambulatory Visit (INDEPENDENT_AMBULATORY_CARE_PROVIDER_SITE_OTHER): Payer: No Typology Code available for payment source | Admitting: Surgery

## 2022-10-14 ENCOUNTER — Encounter: Payer: Self-pay | Admitting: Surgery

## 2022-10-14 ENCOUNTER — Encounter: Payer: Self-pay | Admitting: Neurosurgery

## 2022-10-14 VITALS — BP 121/80 | HR 80 | Temp 98.0°F | Ht 66.0 in | Wt 255.0 lb

## 2022-10-14 DIAGNOSIS — D171 Benign lipomatous neoplasm of skin and subcutaneous tissue of trunk: Secondary | ICD-10-CM | POA: Diagnosis not present

## 2022-10-14 DIAGNOSIS — R200 Anesthesia of skin: Secondary | ICD-10-CM | POA: Diagnosis not present

## 2022-10-14 DIAGNOSIS — D1721 Benign lipomatous neoplasm of skin and subcutaneous tissue of right arm: Secondary | ICD-10-CM | POA: Diagnosis not present

## 2022-10-14 DIAGNOSIS — R202 Paresthesia of skin: Secondary | ICD-10-CM | POA: Diagnosis not present

## 2022-10-14 NOTE — Progress Notes (Signed)
10/14/2022  Reason for Visit: Right forearm lipomas, back lipoma  Requesting Provider: Elizabeth Sauer, MD  History of Present Illness: Margaret Cuevas is a 47 y.o. female status post excision of lipomas of the bilateral lower back and left upper back on 12/02/2018, presenting now as a new referral for new lipomas that the patient has noticed in the right forearm and left upper back.  The patient reports that shortly after her procedure in 2020, she noticed a new lipoma developing in the left upper back above the prior incision.  Recently also she has noticed small masses in the right forearm 1 of which particularly gives her tenderness and shooting sensation to her fingers when pushing on it.  Denies any skin changes, swelling, redness, drainage.  She had an ultrasound of the right forearm on 09/25/2022 which showed 2 small lipomas in the right forearm, measuring 1.7 x 0.9 x 1.8 cm and 0.5 x 0.4 x 0.3 cm.  Past Medical History: Past Medical History:  Diagnosis Date   Anemia    Anxiety    Chest pain    Fibromyalgia    GERD (gastroesophageal reflux disease)    Hiatal hernia    History of Roux-en-Y gastric bypass 2006   Lipoma of back    Lumbar disc herniation    Lumbar radiculopathy    Obesity    OSA on CPAP      Past Surgical History: Past Surgical History:  Procedure Laterality Date   ABDOMINAL HYSTERECTOMY     ANKLE FRACTURE SURGERY     plates and screws placed on both sides   APPENDECTOMY     BACK SURGERY     lumbar L4-5   CESAREAN SECTION     CHOLECYSTECTOMY     COLONOSCOPY N/A 05/04/2022   Procedure: COLONOSCOPY;  Surgeon: Jaynie Collins, DO;  Location: [facility] ENDOSCOPY;  Service: Gastroenterology;  Laterality: N/A;   EAR TUBE REMOVAL     ESOPHAGOGASTRODUODENOSCOPY N/A 05/04/2022   Procedure: ESOPHAGOGASTRODUODENOSCOPY (EGD);  Surgeon: Jaynie Collins, DO;  Location: [facility] ENDOSCOPY;  Service: Gastroenterology;  Laterality: N/A;   GASTRIC BYPASS  2006   ORIF FINGER  / THUMB FRACTURE     screws in thumb    Home Medications: Prior to Admission medications   Medication Sig Start Date End Date Taking? Authorizing Provider  acetaminophen (TYLENOL) 650 MG CR tablet Take 1,300 mg by mouth every 8 (eight) hours as needed for pain.   Yes [provider]  busPIRone (BUSPAR) 7.5 MG tablet Take 1 tablet (7.5 mg total) by mouth 2 (two) times daily. 05/13/22  Yes [name], MD  celecoxib (CELEBREX) 100 MG capsule Take 1 capsule by mouth 2 (two) times daily. Rheum 01/15/21  Yes [provider]  cetirizine (ZYRTEC) 10 MG tablet Take 1 tablet (10 mg total) by mouth daily. Patient taking differently: Take 10 mg by mouth every morning. 05/13/22  Yes [name], MD  cyclobenzaprine (FLEXERIL) 10 MG tablet TAKE 1 TABLET(10 MG) BY MOUTH AT BEDTIME Patient taking differently: Take 10 mg by mouth daily as needed. 06/20/21  Yes [name], MD  DULoxetine (CYMBALTA) 60 MG capsule Take 60 mg by mouth every morning. And then an additional if needed   Yes [provider]  estradiol (VIVELLE-DOT) 0.1 MG/24HR patch Place 1 patch (0.1 mg total) onto the skin 2 (two) times a week. Dr Feliberto Gottron 01/02/21  Yes [name], MD  gabapentin (NEURONTIN) 300 MG capsule Take 3 capsules (900 mg  total) by mouth 3 (three) times daily. 09/22/22 11/21/22 Yes Venetia Night, MD  nortriptyline (PAMELOR) 10 MG capsule Take 3 capsules (30 mg total) by mouth at bedtime. neurology 08/24/22  Yes Susanne Borders, PA  oxyCODONE (ROXICODONE) 5 MG immediate release tablet Take 1 tablet (5 mg total) by mouth every 6 (six) hours as needed for severe pain. 09/30/22  Yes Drake Leach, PA-C    Allergies: Allergies  Allergen Reactions   Ibuprofen Other (See Comments)    Motrin makes her throat swell. Can take ibuprofen    Social History:  reports that she has been smoking cigarettes. She has been exposed to tobacco smoke. She has never used smokeless tobacco. She  reports current alcohol use. She reports that she does not use drugs.   Family History: Family History  Problem Relation Age of Onset   Diabetes Mother    Hypertension Father    Cancer Maternal Grandmother    Diabetes Maternal Grandmother    Hypertension Maternal Grandmother    Hypertension Paternal Grandmother    Cancer Paternal Grandfather    Breast cancer Cousin 40       pat cousin    Review of Systems: Review of Systems  Constitutional:  Negative for chills and fever.  Respiratory:  Negative for shortness of breath.   Cardiovascular:  Negative for chest pain.  Gastrointestinal:  Negative for nausea and vomiting.  Neurological:  Positive for sensory change (tingliness over right fingers when pushing on the lipoma).    Physical Exam BP 121/80   Pulse 80   Temp 98 F (36.7 C)   Ht 5\' 6"  (1.676 m)   Wt 255 lb (115.7 kg)   SpO2 97%   BMI 41.16 kg/m  CONSTITUTIONAL: No acute distress, well-nourished HEENT:  Normocephalic, atraumatic, extraocular motion intact. RESPIRATORY:  Normal respiratory effort without pathologic use of accessory muscles. CARDIOVASCULAR: Regular rhythm and rate. SKIN: Patient has a small 2 cm mass over the ulnar aspect of the mid right forearm that is somewhat mobile, soft, without overlying skin changes.  Slightly more distal and lateral, is the other smaller mass measuring less than 1 cm.  There is also soft and mobile.  In the left upper back, approximately 3 cm superior to the prior incision, is a small 1 cm mass also consistent with a lipoma. NEUROLOGIC:  Motor and sensation is grossly normal.  Cranial nerves are grossly intact. PSYCH:  Alert and oriented to person, place and time. Affect is normal.  Laboratory Analysis: Labs from 07/27/2022: Sodium 142, potassium 4.4, chloride 109, CO2 26, BUN 19, creatinine 0.57.  Imaging: Ultrasound right forearm on 09/25/2022: FINDINGS: Scanning was directed toward the region of concern. A  somewhat prominent fatty lobule measuring 1.7 x 0.9 x 1.8 cm is identified with a second smaller hypoechoic focus measuring 0.5 x 0.4 x 0.3 cm also seen. There is no flow within these lesions on Doppler imaging. No other abnormality is identified.   IMPRESSION: Findings most consistent with 2 small lipomas in the region of concern.  Assessment and Plan: This is a 47 y.o. female with 2 right forearm lipomas and new left upper back lipoma.  - Discussed with the patient that based on exam and imaging, these masses appear to be new lipomas that she has developed.  Also given that the left upper back lipoma is more superior to the incision we made 4 years ago, I think this is a new mass rather than a recurrence.  Discussed with her  that we can excise these masses as an office procedure.  We will try to do this in 2 separate procedure dates for the forearm and then for the back.  Reviewed the procedures at length with her including the planned incisions, risks of bleeding, infection, injury to surrounding structures, and she is willing to proceed.  Will schedule her for the forearm excision on 10/23/2022.  All of her questions have been answered.  I spent 30 minutes dedicated to the care of this patient on the date of this encounter to include pre-visit review of records, face-to-face time with the patient discussing diagnosis and management, and any post-visit coordination of care.   Howie Ill, MD [facility]

## 2022-10-14 NOTE — Patient Instructions (Signed)
We will schedule for removal of your lipomas in office.

## 2022-10-15 ENCOUNTER — Encounter: Payer: Self-pay | Admitting: Neurosurgery

## 2022-10-15 ENCOUNTER — Ambulatory Visit: Payer: No Typology Code available for payment source | Admitting: Neurosurgery

## 2022-10-15 VITALS — BP 132/78 | Ht 66.0 in | Wt 255.0 lb

## 2022-10-15 DIAGNOSIS — M48061 Spinal stenosis, lumbar region without neurogenic claudication: Secondary | ICD-10-CM

## 2022-10-15 DIAGNOSIS — M5116 Intervertebral disc disorders with radiculopathy, lumbar region: Secondary | ICD-10-CM

## 2022-10-15 DIAGNOSIS — M5416 Radiculopathy, lumbar region: Secondary | ICD-10-CM

## 2022-10-15 DIAGNOSIS — M5126 Other intervertebral disc displacement, lumbar region: Secondary | ICD-10-CM

## 2022-10-15 NOTE — Progress Notes (Signed)
Referring Physician:  No referring provider defined for this encounter.  Primary Physician:  [name], MD  Surgery: 08/07/2022 (R L4/5 far lateral discectomy)  History of Present Illness: 10/15/2022 Margaret Cuevas returns to see me with continued severe pain.  She has pain in a similar distribution to what she had prior to surgery as well as pain into her right buttock and down her leg in an L5 distribution.  She has tried physical therapy since surgery without improvement.  She is here to review her imaging.  09/15/2022 Margaret Cuevas is status post far lateral discectomy.  She is having pain in her back as well as discomfort in both of her buttocks.  She is having some new discomfort down the back of her right leg that was not present prior to surgery.  She is also having some numbness in the front of her right calf.  Overall, she has had a very difficult time postoperatively.  We recently increased her gabapentin to 600 mg 3 times a day last Friday.  She has not seen a significant increase in relief from that.   07/14/2022 Margaret Cuevas is here today with a chief complaint of right greater than left leg pain.  She gets pain into the buttocks that extends into her right anterior thigh and her medial calf.  She has numbness into her great toe on the right.  She also has pain in her third toe.  She has significant shooting pain down her right leg into her anterior thigh.  She also has some pain in her lower back, but this pain in her right leg is the worst.  Her pain is as bad as 10 out of 10.  Sitting and activity makes it worse.  Standing and leaning forward helps somewhat.  It is constant.  Bowel/Bladder Dysfunction: none  Conservative measures:  Physical therapy:  has participated in at [facility] from 05/21/22 to 06/09/22 Multimodal medical therapy including regular antiinflammatories:  gabapentin, flexeril, tylenol, celebrex, tramadol, duloxetine.  Injections:  has received epidural  steroid injections 06/17/2022: Right L4-5 transforaminal ESI , with one day relief  Past Surgery:  Lumbar discectomy in her teens   Margaret Cuevas has no symptoms of cervical myelopathy.  The symptoms are causing a significant impact on the patient's life.   Saw Danielle K on 06/25/2022  History of Present Illness: 06/25/22  Margaret Cuevas is a 47 y.o presenting today via telephone visit to discuss her send and worsening symptoms despite your recent injection with Dr. Clydene Fake.  She states that she is not having symptoms bilaterally as well as perineal numbness.  She denies any lower extremity weakness.   06/01/22 Margaret Cuevas is a 47 y.o with a history of right sided lumbar radiculopathy presenting today via telephone visit to review her response to PT and discuss her MRI results. She reports no significant change in her symptoms. No new or worsening symptoms.   04/28/22 Margaret Cuevas is a 47 y.o with a history of anxiety, fibromyalgia who is here today with a chief complaint of back pain. She was seen in the ER on 03/25/22 for this. At the time she complained of back pain more severe than usual with strong point tenderness along the midline of her lumbar spine without an recent inciting event. At the time she was taking gabapentin, Flexeril, and Tylenol. At lumbar CT scan was done which was without acute fracture but did show signs concerning for broad disc protrusions at L4-5  and L5-S1. Today she reports a 3 month history of increased low back pain although she endorses about 1 year of vaginal and groin pain.  She describes pain that starts in the middle of her back and radiates to her tailbone and into her bilateral hips worse on the right and some pain into her posterior thighs. She states that she will occasionally have severe sharp pains in her third toe on her right foot.  She will also occasionally have radiating pain down her right leg and this occurs typically when she feels as though her back is  "locking up".  Her symptoms seem to do better with standing and leaning forward and worse with sitting.  She denies any weakness in her lower extremities or bowel or bladder incontinence. She underwent PT at [facility] for vaginal pain without any significant improvement. She states she was told symptoms are likely coming from her back and was given back exercises however she did not undergo any formal physical therapy for her low back. Note, she reports smoking tobacco socially.   Past Surgery: Lumbar discectomy in her teens     I have utilized the care everywhere function in epic to review the outside records available from external health systems.  Review of Systems:  A 10 point review of systems is negative, except for the pertinent positives and negatives detailed in the HPI.  Past Medical History: Past Medical History:  Diagnosis Date   Anemia    Anxiety    Chest pain    Fibromyalgia    GERD (gastroesophageal reflux disease)    Hiatal hernia    History of Roux-en-Y gastric bypass 2006   Lipoma of back    Lumbar disc herniation    Lumbar radiculopathy    Obesity    OSA on CPAP     Past Surgical History: Past Surgical History:  Procedure Laterality Date   ABDOMINAL HYSTERECTOMY     ANKLE FRACTURE SURGERY     plates and screws placed on both sides   APPENDECTOMY     BACK SURGERY     lumbar L4-5   CESAREAN SECTION     CHOLECYSTECTOMY     COLONOSCOPY N/A 05/04/2022   Procedure: COLONOSCOPY;  Surgeon: Jaynie Collins, DO;  Location: [facility] ENDOSCOPY;  Service: Gastroenterology;  Laterality: N/A;   EAR TUBE REMOVAL     ESOPHAGOGASTRODUODENOSCOPY N/A 05/04/2022   Procedure: ESOPHAGOGASTRODUODENOSCOPY (EGD);  Surgeon: Jaynie Collins, DO;  Location: [facility] ENDOSCOPY;  Service: Gastroenterology;  Laterality: N/A;   GASTRIC BYPASS  2006   ORIF FINGER / THUMB FRACTURE     screws in thumb    Allergies: Allergies as of 10/15/2022 - Review Complete 10/15/2022  Allergen  Reaction Noted   Ibuprofen Other (See Comments)     Medications: Current Meds  Medication Sig   acetaminophen (TYLENOL) 650 MG CR tablet Take 1,300 mg by mouth every 8 (eight) hours as needed for pain.   busPIRone (BUSPAR) 7.5 MG tablet Take 1 tablet (7.5 mg total) by mouth 2 (two) times daily.   celecoxib (CELEBREX) 100 MG capsule Take 1 capsule by mouth 2 (two) times daily. Rheum   cetirizine (ZYRTEC) 10 MG tablet Take 1 tablet (10 mg total) by mouth daily. (Patient taking differently: Take 10 mg by mouth every morning.)   cyclobenzaprine (FLEXERIL) 10 MG tablet TAKE 1 TABLET(10 MG) BY MOUTH AT BEDTIME (Patient taking differently: Take 10 mg by mouth daily as needed.)   DULoxetine (CYMBALTA) 60 MG capsule Take  60 mg by mouth every morning. And then an additional if needed   estradiol (VIVELLE-DOT) 0.1 MG/24HR patch Place 1 patch (0.1 mg total) onto the skin 2 (two) times a week. Dr Schermerhorn   gabapentin (NEURONTIN) 300 MG capsule Take 3 capsules (900 mg total) by mouth 3 (three) times daily.   nortriptyline (PAMELOR) 10 MG capsule Take 3 capsules (30 mg total) by mouth at bedtime. neurology   oxyCODONE (ROXICODONE) 5 MG immediate release tablet Take 1 tablet (5 mg total) by mouth every 6 (six) hours as needed for severe pain.    Social History: Social History   Tobacco Use   Smoking status: Some Days    Types: Cigarettes    Passive exposure: Past   Smokeless tobacco: Never   Tobacco comments:    social  Vaping Use   Vaping Use: Never used  Substance Use Topics   Alcohol use: Yes    Comment: social   Drug use: No    Family Medical History: Family History  Problem Relation Age of Onset   Diabetes Mother    Hypertension Father    Cancer Maternal Grandmother    Diabetes Maternal Grandmother    Hypertension Maternal Grandmother    Hypertension Paternal Grandmother    Cancer Paternal Grandfather    Breast cancer Cousin 40       pat cousin    Physical  Examination: Vitals:   10/15/22 1536  BP: 132/78    General: Patient is well developed, well nourished, calm, collected, and in no apparent distress. Attention to examination is appropriate.  She is in obvious pain  Neck:   Supple.  Full range of motion.  Respiratory: Patient is breathing without any difficulty.   NEUROLOGICAL:     Awake, alert, oriented to person, place, and time.  Speech is clear and fluent.   Cranial Nerves: Pupils equal round and reactive to light.  Facial tone is symmetric.  Facial sensation is symmetric. Shoulder shrug is symmetric. Tongue protrusion is midline.  There is no pronator drift.  ROM of spine: full.    Strength: Side Biceps Triceps Deltoid Interossei Grip Wrist Ext. Wrist Flex.  R 5 5 5 5 5 5 5   L 5 5 5 5 5 5 5    Side Iliopsoas Quads Hamstring PF DF EHL  R 5 4 4 5 5 5   L 5 5 5 5 5 5    Reflexes are 1+ and symmetric at the biceps, triceps, brachioradialis, patella and achilles.   Hoffman's is absent.   Bilateral upper and lower extremity sensation is intact to light touch with exception of right L4 and L5 distributions which is diminished.    No evidence of dysmetria noted.  Gait is antalgic.  She has pain in her right leg with straight leg raise.  She is obviously uncomfortable.  She is walking with a walker.   Medical Decision Making  Imaging: MRI L spine 05/15/2022 IMPRESSION: 1. Unchanged large right foraminal and far lateral disc protrusion at L4-L5 contacting and displacing the exiting right L4 nerve root. 2. 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     Electronically Signed   By: Obie Dredge M.D.   On: 05/15/2022 10:24  MRI L spine 09/19/2022 IMPRESSION: 1. Postsurgical changes from right L4-5 discectomy and foraminotomy. There is residual right foraminal disc protrusion with postsurgical enhancement of the right foraminal fat, resulting in moderate narrowing. 2. Moderate bilateral  neural foraminal narrowing at L5-S1, unchanged. 3. No high-grade spinal  canal stenosis at any level. 4. Postsurgical fluid collection in the subcutaneous soft tissues to the right of midline at the L4-5 level measuring up to 3.1 cm.     Electronically Signed   By: Baldemar Lenis M.D.   On: 09/28/2022 10:02  I have personally reviewed the images and agree with the above interpretation.  Assessment and Plan: Margaret Cuevas is a pleasant 47 y.o. female with symptoms consistent with a right L4 and L5 radiculopathy.  She has had return and worsening of her symptoms since surgery and has MRI findings consistent with a recurrent disc herniation.  She also has moderate foraminal stenosis compressing the right L5 nerve root.    It is very difficult to interpret postoperative MRI scans from far lateral discectomy, but I feel she has a significant recurrent disc herniation.  Reexploration of a far lateral discectomy is fraught with risk of nerve root injury in my opinion.  As such, I would recommend an indirect approach such as L4-5 lateral lumbar interbody fusion with percutaneous fixation to increase her interpedicular distance and increase her foraminal size at L4-5.  At the same time, she has symptoms of right L5 compression at this point, so I have recommended intervention at that level as well to decompress the right L5 nerve root.  I do not think that further conservative management is indicated.  She has tried conservative management since surgery without improvement.  I would recommend the surgery to try best to address her symptoms.  We did discuss the possibility of a second opinion.  I offered this, but she would like to move forward with surgical intervention.    I discussed the planned procedure at length with the patient, including the risks, benefits, alternatives, and indications. The risks discussed include but are not limited to bleeding, infection, need for reoperation, spinal  fluid leak, stroke, vision loss, anesthetic complication, coma, paralysis, and even death. I also described the possibility of psoas weakness and paresthesias. I described in detail that improvement was not guaranteed.  The patient expressed understanding of these risks, and asked that we proceed with surgery. I described the surgery in layman's terms, and gave ample opportunity for questions, which were answered to the best of my ability.   I spent a total of 30 minutes in this patient's care today. This time was spent reviewing pertinent records including imaging studies, obtaining and confirming history, performing a directed evaluation, formulating and discussing my recommendations, and documenting the visit within the medical record.    Thank you for involving me in the care of this patient.      Dalylah Ramey K. Myer Haff MD, [facility]

## 2022-10-15 NOTE — H&P (View-Only) (Signed)
Referring Physician:  No referring provider defined for this encounter.  Primary Physician:  [name], MD  Surgery: 08/07/2022 (R L4/5 far lateral discectomy)  History of Present Illness: 10/15/2022 Margaret Cuevas returns to see me with continued severe pain.  She has pain in a similar distribution to what she had prior to surgery as well as pain into her right buttock and down her leg in an L5 distribution.  She has tried physical therapy since surgery without improvement.  She is here to review her imaging.  09/15/2022 Margaret Cuevas is status post far lateral discectomy.  She is having pain in her back as well as discomfort in both of her buttocks.  She is having some new discomfort down the back of her right leg that was not present prior to surgery.  She is also having some numbness in the front of her right calf.  Overall, she has had a very difficult time postoperatively.  We recently increased her gabapentin to 600 mg 3 times a day last Friday.  She has not seen a significant increase in relief from that.   07/14/2022 Margaret Cuevas is here today with a chief complaint of right greater than left leg pain.  She gets pain into the buttocks that extends into her right anterior thigh and her medial calf.  She has numbness into her great toe on the right.  She also has pain in her third toe.  She has significant shooting pain down her right leg into her anterior thigh.  She also has some pain in her lower back, but this pain in her right leg is the worst.  Her pain is as bad as 10 out of 10.  Sitting and activity makes it worse.  Standing and leaning forward helps somewhat.  It is constant.  Bowel/Bladder Dysfunction: none  Conservative measures:  Physical therapy:  has participated in at [facility] from 05/21/22 to 06/09/22 Multimodal medical therapy including regular antiinflammatories:  gabapentin, flexeril, tylenol, celebrex, tramadol, duloxetine.  Injections:  has received epidural  steroid injections 06/17/2022: Right L4-5 transforaminal ESI , with one day relief  Past Surgery:  Lumbar discectomy in her teens   [place] Kubinski has no symptoms of cervical myelopathy.  The symptoms are causing a significant impact on the patient's life.   Saw Danielle K on 06/25/2022  History of Present Illness: 06/25/22  Margaret Cuevas is a 47 y.o presenting today via telephone visit to discuss her send and worsening symptoms despite your recent injection with Dr. Clydene Fake.  She states that she is not having symptoms bilaterally as well as perineal numbness.  She denies any lower extremity weakness.   06/01/22 Margaret Cuevas is a 47 y.o with a history of right sided lumbar radiculopathy presenting today via telephone visit to review her response to PT and discuss her MRI results. She reports no significant change in her symptoms. No new or worsening symptoms.   04/28/22 Margaret Cuevas is a 47 y.o with a history of anxiety, fibromyalgia who is here today with a chief complaint of back pain. She was seen in the ER on 03/25/22 for this. At the time she complained of back pain more severe than usual with strong point tenderness along the midline of her lumbar spine without an recent inciting event. At the time she was taking gabapentin, Flexeril, and Tylenol. At lumbar CT scan was done which was without acute fracture but did show signs concerning for broad disc protrusions at L4-5  and L5-S1. Today she reports a 3 month history of increased low back pain although she endorses about 1 year of vaginal and groin pain.  She describes pain that starts in the middle of her back and radiates to her tailbone and into her bilateral hips worse on the right and some pain into her posterior thighs. She states that she will occasionally have severe sharp pains in her third toe on her right foot.  She will also occasionally have radiating pain down her right leg and this occurs typically when she feels as though her back is  "locking up".  Her symptoms seem to do better with standing and leaning forward and worse with sitting.  She denies any weakness in her lower extremities or bowel or bladder incontinence. She underwent PT at [facility] for vaginal pain without any significant improvement. She states she was told symptoms are likely coming from her back and was given back exercises however she did not undergo any formal physical therapy for her low back. Note, she reports smoking tobacco socially.   Past Surgery: Lumbar discectomy in her teens     I have utilized the care everywhere function in epic to review the outside records available from external health systems.  Review of Systems:  A 10 point review of systems is negative, except for the pertinent positives and negatives detailed in the HPI.  Past Medical History: Past Medical History:  Diagnosis Date   Anemia    Anxiety    Chest pain    Fibromyalgia    GERD (gastroesophageal reflux disease)    Hiatal hernia    History of Roux-en-Y gastric bypass 2006   Lipoma of back    Lumbar disc herniation    Lumbar radiculopathy    Obesity    OSA on CPAP     Past Surgical History: Past Surgical History:  Procedure Laterality Date   ABDOMINAL HYSTERECTOMY     ANKLE FRACTURE SURGERY     plates and screws placed on both sides   APPENDECTOMY     BACK SURGERY     lumbar L4-5   CESAREAN SECTION     CHOLECYSTECTOMY     COLONOSCOPY N/A 05/04/2022   Procedure: COLONOSCOPY;  Surgeon: Jaynie Collins, DO;  Location: [facility] ENDOSCOPY;  Service: Gastroenterology;  Laterality: N/A;   EAR TUBE REMOVAL     ESOPHAGOGASTRODUODENOSCOPY N/A 05/04/2022   Procedure: ESOPHAGOGASTRODUODENOSCOPY (EGD);  Surgeon: Jaynie Collins, DO;  Location: [facility] ENDOSCOPY;  Service: Gastroenterology;  Laterality: N/A;   GASTRIC BYPASS  2006   ORIF FINGER / THUMB FRACTURE     screws in thumb    Allergies: Allergies as of 10/15/2022 - Review Complete 10/15/2022  Allergen  Reaction Noted   Ibuprofen Other (See Comments)     Medications: Current Meds  Medication Sig   acetaminophen (TYLENOL) 650 MG CR tablet Take 1,300 mg by mouth every 8 (eight) hours as needed for pain.   busPIRone (BUSPAR) 7.5 MG tablet Take 1 tablet (7.5 mg total) by mouth 2 (two) times daily.   celecoxib (CELEBREX) 100 MG capsule Take 1 capsule by mouth 2 (two) times daily. Rheum   cetirizine (ZYRTEC) 10 MG tablet Take 1 tablet (10 mg total) by mouth daily. (Patient taking differently: Take 10 mg by mouth every morning.)   cyclobenzaprine (FLEXERIL) 10 MG tablet TAKE 1 TABLET(10 MG) BY MOUTH AT BEDTIME (Patient taking differently: Take 10 mg by mouth daily as needed.)   DULoxetine (CYMBALTA) 60 MG capsule Take  60 mg by mouth every morning. And then an additional if needed   estradiol (VIVELLE-DOT) 0.1 MG/24HR patch Place 1 patch (0.1 mg total) onto the skin 2 (two) times a week. Dr Schermerhorn   gabapentin (NEURONTIN) 300 MG capsule Take 3 capsules (900 mg total) by mouth 3 (three) times daily.   nortriptyline (PAMELOR) 10 MG capsule Take 3 capsules (30 mg total) by mouth at bedtime. neurology   oxyCODONE (ROXICODONE) 5 MG immediate release tablet Take 1 tablet (5 mg total) by mouth every 6 (six) hours as needed for severe pain.    Social History: Social History   Tobacco Use   Smoking status: Some Days    Types: Cigarettes    Passive exposure: Past   Smokeless tobacco: Never   Tobacco comments:    social  Vaping Use   Vaping Use: Never used  Substance Use Topics   Alcohol use: Yes    Comment: social   Drug use: No    Family Medical History: Family History  Problem Relation Age of Onset   Diabetes Mother    Hypertension Father    Cancer Maternal Grandmother    Diabetes Maternal Grandmother    Hypertension Maternal Grandmother    Hypertension Paternal Grandmother    Cancer Paternal Grandfather    Breast cancer Cousin 40       pat cousin    Physical  Examination: Vitals:   10/15/22 1536  BP: 132/78    General: Patient is well developed, well nourished, calm, collected, and in no apparent distress. Attention to examination is appropriate.  She is in obvious pain  Neck:   Supple.  Full range of motion.  Respiratory: Patient is breathing without any difficulty.   NEUROLOGICAL:     Awake, alert, oriented to person, place, and time.  Speech is clear and fluent.   Cranial Nerves: Pupils equal round and reactive to light.  Facial tone is symmetric.  Facial sensation is symmetric. Shoulder shrug is symmetric. Tongue protrusion is midline.  There is no pronator drift.  ROM of spine: full.    Strength: Side Biceps Triceps Deltoid Interossei Grip Wrist Ext. Wrist Flex.  R 5 5 5 5 5 5 5   L 5 5 5 5 5 5 5    Side Iliopsoas Quads Hamstring PF DF EHL  R 5 4 4 5 5 5   L 5 5 5 5 5 5    Reflexes are 1+ and symmetric at the biceps, triceps, brachioradialis, patella and achilles.   Hoffman's is absent.   Bilateral upper and lower extremity sensation is intact to light touch with exception of right L4 and L5 distributions which is diminished.    No evidence of dysmetria noted.  Gait is antalgic.  She has pain in her right leg with straight leg raise.  She is obviously uncomfortable.  She is walking with a walker.   Medical Decision Making  Imaging: MRI L spine 05/15/2022 IMPRESSION: 1. Unchanged large right foraminal and far lateral disc protrusion at L4-L5 contacting and displacing the exiting right L4 nerve root. 2. 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     Electronically Signed   By: Obie Dredge M.D.   On: 05/15/2022 10:24  MRI L spine 09/19/2022 IMPRESSION: 1. Postsurgical changes from right L4-5 discectomy and foraminotomy. There is residual right foraminal disc protrusion with postsurgical enhancement of the right foraminal fat, resulting in moderate narrowing. 2. Moderate bilateral  neural foraminal narrowing at L5-S1, unchanged. 3. No high-grade spinal  canal stenosis at any level. 4. Postsurgical fluid collection in the subcutaneous soft tissues to the right of midline at the L4-5 level measuring up to 3.1 cm.     Electronically Signed   By: Baldemar Lenis M.D.   On: 09/28/2022 10:02  I have personally reviewed the images and agree with the above interpretation.  Assessment and Plan: Margaret Cuevas is a pleasant 47 y.o. female with symptoms consistent with a right L4 and L5 radiculopathy.  She has had return and worsening of her symptoms since surgery and has MRI findings consistent with a recurrent disc herniation.  She also has moderate foraminal stenosis compressing the right L5 nerve root.    It is very difficult to interpret postoperative MRI scans from far lateral discectomy, but I feel she has a significant recurrent disc herniation.  Reexploration of a far lateral discectomy is fraught with risk of nerve root injury in my opinion.  As such, I would recommend an indirect approach such as L4-5 lateral lumbar interbody fusion with percutaneous fixation to increase her interpedicular distance and increase her foraminal size at L4-5.  At the same time, she has symptoms of right L5 compression at this point, so I have recommended intervention at that level as well to decompress the right L5 nerve root.  I do not think that further conservative management is indicated.  She has tried conservative management since surgery without improvement.  I would recommend the surgery to try best to address her symptoms.  We did discuss the possibility of a second opinion.  I offered this, but she would like to move forward with surgical intervention.    I discussed the planned procedure at length with the patient, including the risks, benefits, alternatives, and indications. The risks discussed include but are not limited to bleeding, infection, need for reoperation, spinal  fluid leak, stroke, vision loss, anesthetic complication, coma, paralysis, and even death. I also described the possibility of psoas weakness and paresthesias. I described in detail that improvement was not guaranteed.  The patient expressed understanding of these risks, and asked that we proceed with surgery. I described the surgery in layman's terms, and gave ample opportunity for questions, which were answered to the best of my ability.   I spent a total of 30 minutes in this patient's care today. This time was spent reviewing pertinent records including imaging studies, obtaining and confirming history, performing a directed evaluation, formulating and discussing my recommendations, and documenting the visit within the medical record.    Thank you for involving me in the care of this patient.      Chester K. Myer Haff MD, [facility]

## 2022-10-15 NOTE — Patient Instructions (Signed)
Please see below for information in regards to your upcoming surgery:   Planned surgery: L4-5 lateral lumbar interbody fusion and posterior spinal fusion, Right L5-S1 far lateral foraminotomy   Surgery date: 11/09/22 - you will find out your arrival time the business day before your surgery.   Pre-op appointment at [facility] Pre-admit Testing: we will call you with a date/time for this. Pre-admit testing is located on the first floor of the [facility], [address] [facility] [address], [address]. Please bring all prescriptions in the original prescription bottles to your appointment, even if you have reviewed medications by phone with a pharmacy representative. Pre-op labs may be done at your pre-op appointment. You are not required to fast for these labs. Should you need to change your pre-op appointment, please call Pre-admit testing at [phone].    Surgical clearance: we will send a clearance form to Dr Yetta Barre     NSAIDS (Non-steroidal anti-inflammatory drugs): because you are having a fusion, no NSAIDS (such as ibuprofen, aleve, naproxen, meloxicam, diclofenac) for 3 months after surgery. Celebrex is an exception. Tylenol is ok because it is not an NSAID.   Because you are having a fusion: for appointments after your 2 week follow-up: please arrive at the [facility] outpatient imaging center ([address], [address], [place]) or [facility] one hour prior to your appointment for x-rays. This applies to every appointment after your 2 week follow-up. Failure to do so may result in your appointment being rescheduled.   If you have FMLA/disability paperwork, please drop it off or fax it to [phone], attention Patty.   How to contact us:  If you have any questions/concerns before or after surgery, you can reach [place] at [phone], or you can send a mychart message. We can be reached by phone or mychart 8am-4pm, Monday-Friday.  *Please note: Calls  after 4pm are forwarded to a third party answering service. Mychart messages are not routinely monitored during evenings, weekends, and holidays. Please call our office to contact the answering service for urgent concerns during non-business hours.    Appointments/FMLA & disability paperwork: Patty & Cristin  Nurse: Royston Cowper  Medical assistants: Laurann Montana Physician Assistant's: Manning Charity & Drake Leach Surgeon: Venetia Night, MD

## 2022-10-16 ENCOUNTER — Other Ambulatory Visit: Payer: Self-pay | Admitting: Family Medicine

## 2022-10-16 ENCOUNTER — Other Ambulatory Visit: Payer: Self-pay

## 2022-10-16 DIAGNOSIS — F419 Anxiety disorder, unspecified: Secondary | ICD-10-CM

## 2022-10-16 DIAGNOSIS — Z01818 Encounter for other preprocedural examination: Secondary | ICD-10-CM

## 2022-10-19 ENCOUNTER — Encounter: Payer: Self-pay | Admitting: Neurosurgery

## 2022-10-19 DIAGNOSIS — M5416 Radiculopathy, lumbar region: Secondary | ICD-10-CM

## 2022-10-19 MED ORDER — OXYCODONE HCL 5 MG PO TABS
5.0000 mg | ORAL_TABLET | Freq: Three times a day (TID) | ORAL | 0 refills | Status: DC | PRN
Start: 2022-10-19 — End: 2022-11-11

## 2022-10-19 NOTE — Telephone Encounter (Signed)
DOS: 08/07/22 right L4-5 far lateral discectomy    Has recurrent disc herniation. Scheduled for surgery on 11/09/22.   PMP reviewed. 7 day script of oxycodone sent on 09/30/22. Directions changed to q 8 hours prn.   Limited refill of oxycodone okay.

## 2022-10-19 NOTE — Telephone Encounter (Signed)
Note received

## 2022-10-23 ENCOUNTER — Ambulatory Visit (INDEPENDENT_AMBULATORY_CARE_PROVIDER_SITE_OTHER): Payer: No Typology Code available for payment source | Admitting: Surgery

## 2022-10-23 VITALS — BP 120/84 | HR 96 | Temp 97.6°F | Ht 66.0 in | Wt 258.4 lb

## 2022-10-23 DIAGNOSIS — D1721 Benign lipomatous neoplasm of skin and subcutaneous tissue of right arm: Secondary | ICD-10-CM

## 2022-10-23 NOTE — Patient Instructions (Signed)
If you have any concerns or questions, please feel free to call our office. See follow up appointment.  Lipoma Removal, Care After The following information offers guidance on how to care for yourself after your procedure. Your health care provider may also give you more specific instructions. If you have problems or questions, contact your health care provider. What can I expect after the procedure? After the procedure, it is common to have: Mild pain. Swelling. Bruising. Follow these instructions at home: Bathing  Do not take baths, swim, or use a hot tub until your health care provider approves. Ask your health care provider if you may take showers. You may only be allowed to take sponge baths. Keep your bandage (dressing) clean and dry until your health care provider says it can be removed. Incision care  Follow instructions from your health care provider about how to take care of your incision. Make sure you: Wash your hands with soap and water for at least 20 seconds before and after you change your dressing. If soap and water are not available, use hand sanitizer. Change your dressing as told by your health care provider. Leave stitches (sutures), skin glue, or adhesive strips in place. These skin closures may need to stay in place for 2 weeks or longer. If adhesive strip edges start to loosen and curl up, you may trim the loose edges. Do not remove adhesive strips completely unless your health care provider tells you to do that. Check your incision area every day for signs of infection. Check for: More redness, swelling, or pain. Fluid or blood. Warmth. Pus or a bad smell. Medicines Take over-the-counter and prescription medicines only as told by your health care provider. If you were prescribed an antibiotic medicine, use it as told by your health care provider. Do not stop using the antibiotic even if you start to feel better. General instructions  If you were given a sedative  during the procedure, it can affect you for several hours. Do not drive or operate machinery until your health care provider says that it is safe. Do not use any products that contain nicotine or tobacco before the procedure. These products include cigarettes, chewing tobacco, and vaping devices, such as e-cigarettes. These can delay healing after surgery. If you need help quitting, ask your health care provider. Return to your normal activities as told by your health care provider. Ask your health care provider what activities are safe for you. Keep all follow-up visits. This is important. Contact a health care provider if: You have more redness, swelling, or pain around your incision. You have fluid or blood coming from your incision. Your incision feels warm to the touch. You have pus or a bad smell coming from your incision. You have pain that does not get better with medicine. Get help right away if: You have chills or a fever. You have severe pain. Summary After the procedure, it is common to have mild pain, swelling, and bruising. Follow instructions from your health care provider about how to take care of your incision. Contact a health care provider if you have signs of infection such as more redness, swelling, or pain. This information is not intended to replace advice given to you by your health care provider. Make sure you discuss any questions you have with your health care provider. Document Revised: 05/02/2021 Document Reviewed: 05/02/2021 Elsevier Patient Education  2024 [employer]vier [employer].  

## 2022-10-23 NOTE — Progress Notes (Signed)
  Procedure Date:  10/23/2022  Pre-operative Diagnosis:  Right forearm lipomas x 2  Post-operative Diagnosis:  Right forearm lipomas x 2, each < 3 cm.  Procedure:  Excision of right forearm lipomas x 2, each < 3 cm (separate incisions).  Surgeon:  Howie Ill, MD  Anesthesia:  7 ml of 1 % lidocaine with epi.  Estimated Blood Loss:  10 ml  Specimens:   Right forearm lipoma, medial Right forearm lipoma lateral  Complications:  None  Indications for Procedure:  This is a 47 y.o. female with diagnosis of two symptomatic right forearm lipomas.  The patient wishes to have this excised. The risks of bleeding, abscess or infection, injury to surrounding structures, and need for further procedures were all discussed with the patient and she was willing to proceed.  Description of Procedure: The patient was correctly identified at bedside.  The patient was placed supine.  Appropriate time-outs were performed.  The patient's right forearm was prepped and draped in usual sterile fashion.  I started with the medial lipoma.  Local anesthetic was infused intradermally.  A 1.5 cm incision was made over the lipoma, and scalpel was used to dissect down the skin and subcutaneous tissue.  Skin flaps were created sharply, and then the lipoma was excised intact.  It was sent off to pathology.  It measured about 1.5 cm.  Hemostasis was assured with manual pressure.  The wound was then closed in two layers using 3-0 Vicryl and 4-0 Monocryl.    I then moved to the lateral lipoma.  Local anesthetic was infused intradermally.  A separate 1 cm incision was made over the lipoma, and scalpel was used to dissect down the skin and subcutaneous tissue.  Skin flaps were created sharply, and then the lipoma was excised intact.  It was sent off to pathology.  It measured about 0.5 cm.  Hemostasis was assured with manual pressure.  The wound was then closed in two layers using 3-0 Vicryl and 4-0 Monocryl.  The incisions  were cleaned and sealed with DermaBond.  The patient tolerated the procedure well and all sharps were appropriately disposed of at the end of the case.  --Patient may take tylenol or her home pain medications for pain control. --May shower tomorrow, do not scrub heavily and dab dry only. --Do not apply ointments or hydrogen peroxide over the wounds. --Follow up in 10 days.  Howie Ill, MD

## 2022-10-27 ENCOUNTER — Encounter: Payer: No Typology Code available for payment source | Admitting: Neurosurgery

## 2022-10-28 ENCOUNTER — Other Ambulatory Visit: Payer: Self-pay

## 2022-10-28 ENCOUNTER — Encounter
Admission: RE | Admit: 2022-10-28 | Discharge: 2022-10-28 | Disposition: A | Discharge status: Home / Self Care | Payer: No Typology Code available for payment source | Source: Ambulatory Visit | Attending: Neurosurgery | Admitting: Neurosurgery

## 2022-10-28 VITALS — BP 118/91 | HR 73 | Resp 18 | Ht 66.0 in | Wt 261.9 lb

## 2022-10-28 DIAGNOSIS — Z01812 Encounter for preprocedural laboratory examination: Secondary | ICD-10-CM | POA: Diagnosis not present

## 2022-10-28 HISTORY — DX: Unspecified osteoarthritis, unspecified site: M19.90

## 2022-10-28 HISTORY — DX: Unspecified asthma, uncomplicated: J45.909

## 2022-10-28 HISTORY — DX: Other complications of anesthesia, initial encounter: T88.59XA

## 2022-10-28 HISTORY — DX: Pneumonia, unspecified organism: J18.9

## 2022-10-28 HISTORY — DX: Angina pectoris, unspecified: I20.9

## 2022-10-28 LAB — BASIC METABOLIC PANEL
Anion gap: 9 (ref 5–15)
BUN: 17 mg/dL (ref 6–20)
CO2: 28 mmol/L (ref 22–32)
Calcium: 9.1 mg/dL (ref 8.9–10.3)
Chloride: 101 mmol/L (ref 98–111)
Creatinine, Ser: 0.66 mg/dL (ref 0.44–1.00)
GFR, Estimated: >60 mL/min (ref >60)
Glucose, Bld: 96 mg/dL (ref 70–99)
Potassium: 4.6 mmol/L (ref 3.5–5.1)
Sodium: 138 mmol/L (ref 135–145)

## 2022-10-28 LAB — CBC
HCT: 33.7 % — ABNORMAL LOW (ref 36.0–46.0)
Hemoglobin: 10.4 g/dL — ABNORMAL LOW (ref 12.0–15.0)
MCH: 28 pg (ref 26.0–34.0)
MCHC: 30.9 g/dL (ref 30.0–36.0)
MCV: 90.6 fL (ref 80.0–100.0)
Platelets: 293 10*3/uL (ref 150–400)
RBC: 3.72 MIL/uL — ABNORMAL LOW (ref 3.87–5.11)
RDW: 14.6 % (ref 11.5–15.5)
WBC: 5 10*3/uL (ref 4.0–10.5)
nRBC: 0 % (ref 0.0–0.2)

## 2022-10-28 LAB — URINALYSIS, ROUTINE W REFLEX MICROSCOPIC
Bilirubin Urine: NEGATIVE
Glucose, UA: NEGATIVE mg/dL
Hgb urine dipstick: NEGATIVE
Ketones, ur: NEGATIVE mg/dL
Leukocytes,Ua: NEGATIVE
Nitrite: NEGATIVE
Protein, ur: NEGATIVE mg/dL
Specific Gravity, Urine: 1.004 — ABNORMAL LOW (ref 1.005–1.030)
pH: 6 (ref 5.0–8.0)

## 2022-10-28 LAB — TYPE AND SCREEN
ABO/RH(D): A POS
Antibody Screen: NEGATIVE

## 2022-10-28 LAB — SURGICAL PCR SCREEN
MRSA, PCR: NEGATIVE
Staphylococcus aureus: NEGATIVE

## 2022-10-28 NOTE — Patient Instructions (Addendum)
Your procedure is scheduled on: 11/09/22 - Monday Report to the Registration Desk on the 1st floor of the [facility]. To find out your arrival time, please call [phone] between 1PM - 3PM on: 11/06/22 - Friday If your arrival time is 6:00 am, do not arrive before that time as the [facility] entrance doors do not open until 6:00 am.  REMEMBER: Instructions that are not followed completely may result in serious medical risk, up to and including death; or upon the discretion of your surgeon and anesthesiologist your surgery may need to be rescheduled.  Do not eat food after midnight the night before surgery.  No gum chewing or hard candies.  You may however, drink CLEAR liquids up to 2 hours before you are scheduled to arrive for your surgery. Do not drink anything within 2 hours of your scheduled arrival time.  Clear liquids include: - water  - apple juice without pulp - gatorade (not RED colors) - black coffee or tea (Do NOT add milk or creamers to the coffee or tea) Do NOT drink anything that is not on this list.  One week prior to surgery: Stop Anti-inflammatories (NSAIDS) such as Advil, Aleve, Ibuprofen, Motrin, Naproxen, Naprosyn and Aspirin based products such as Excedrin, Goody's Powder, BC Powder.  Stop ANY OVER THE COUNTER supplements until after surgery.  You may however, continue to take Tylenol if needed for pain up until the day of surgery.   TAKE ONLY THESE MEDICATIONS THE MORNING OF SURGERY WITH A SIP OF WATER:  busPIRone (BUSPAR)  celecoxib (CELEBREX)  DULoxetine (CYMBALTA)  gabapentin (NEURONTIN)  oxyCODONE (ROXICODONE) if needed.    No Alcohol for 24 hours before or after surgery.  No Smoking including e-cigarettes for 24 hours before surgery.  No chewable tobacco products for at least 6 hours before surgery.  No nicotine patches on the day of surgery.  Do not use any "recreational" drugs for at least a week (preferably 2 weeks) before your  surgery.  Please be advised that the combination of cocaine and anesthesia may have negative outcomes, up to and including death. If you test positive for cocaine, your surgery will be cancelled.  On the morning of surgery brush your teeth with toothpaste and water, you may rinse your mouth with mouthwash if you wish. Do not swallow any toothpaste or mouthwash.  Use CHG Soap or wipes as directed on instruction sheet.  Do not wear jewelry, make-up, hairpins, clips or nail polish.  Do not wear lotions, powders, or perfumes.   Do not shave body hair from the neck down 48 hours before surgery.  Contact lenses, hearing aids and dentures may not be worn into surgery.  Do not bring valuables to the hospital. [facility] is not responsible for any missing/lost belongings or valuables.   Bring your C-PAP to the hospital in case you may have to spend the night.   Notify your doctor if there is any change in your medical condition (cold, fever, infection).  Wear comfortable clothing (specific to your surgery type) to the hospital.  After surgery, you can help prevent lung complications by doing breathing exercises.  Take deep breaths and cough every 1-2 hours. Your doctor may order a device called an Incentive Spirometer to help you take deep breaths. When coughing or sneezing, hold a pillow firmly against your incision with both hands. This is called "splinting." Doing this helps protect your incision. It also decreases belly discomfort.  If you are being admitted to  the hospital overnight, leave your suitcase in the car. After surgery it may be brought to your room.  In case of increased patient census, it may be necessary for you, the patient, to continue your postoperative care in the Same Day Surgery department.  If you are being discharged the day of surgery, you will not be allowed to drive home. You will need a responsible individual to drive you home and stay with you for 24 hours  after surgery.   If you are taking public transportation, you will need to have a responsible individual with you.  Please call the Pre-admissions Testing Dept. at [phone] if you have any questions about these instructions.  Surgery Visitation Policy:  Patients having surgery or a procedure may have two visitors.  Children under the age of 53 must have an adult with them who is not the patient.  Inpatient Visitation:    Visiting hours are 7 a.m. to 8 p.m. Up to four visitors are allowed at one time in a patient room. The visitors may rotate out with other people during the day.  One visitor age 33 or older may stay with the patient overnight and must be in the room by 8 p.m.   Pre-operative 5 CHG Bath Instructions   You can play a key role in reducing the risk of infection after surgery. Your skin needs to be as free of germs as possible. You can reduce the number of germs on your skin by washing with CHG (chlorhexidine gluconate) soap before surgery. CHG is an antiseptic soap that kills germs and continues to kill germs even after washing.   DO NOT use if you have an allergy to chlorhexidine/CHG or antibacterial soaps. If your skin becomes reddened or irritated, stop using the CHG and notify one of our RNs at [phone].   Please shower with the CHG soap starting 4 days before surgery using the following schedule: 11/05/22 - 11/09/22.    Please keep in mind the following:  DO NOT shave, including legs and underarms, starting the day of your first shower.   You may shave your face at any point before/day of surgery.  Place clean sheets on your bed the day you start using CHG soap. Use a clean washcloth (not used since being washed) for each shower. DO NOT sleep with pets once you start using the CHG.   CHG Shower Instructions:  If you choose to wash your hair and private area, wash first with your normal shampoo/soap.  After you use shampoo/soap, rinse your hair and body  thoroughly to remove shampoo/soap residue.  Turn the water OFF and apply about 3 tablespoons (45 ml) of CHG soap to a CLEAN washcloth.  Apply CHG soap ONLY FROM YOUR NECK DOWN TO YOUR TOES (washing for 3-5 minutes)  DO NOT use CHG soap on face, private areas, open wounds, or sores.  Pay special attention to the area where your surgery is being performed.  If you are having back surgery, having someone wash your back for you may be helpful. Wait 2 minutes after CHG soap is applied, then you may rinse off the CHG soap.  Pat dry with a clean towel  Put on clean clothes/pajamas   If you choose to wear lotion, please use ONLY the CHG-compatible lotions on the back of this paper.     Additional instructions for the day of surgery: DO NOT APPLY any lotions, deodorants, cologne, or perfumes.   Put on clean/comfortable clothes.  Brush your teeth.  Ask your nurse before applying any prescription medications to the skin.      CHG Compatible Lotions   Aveeno Moisturizing lotion  Cetaphil Moisturizing Cream  Cetaphil Moisturizing Lotion  Clairol Herbal Essence Moisturizing Lotion, Dry Skin  Clairol Herbal Essence Moisturizing Lotion, Extra Dry Skin  Clairol Herbal Essence Moisturizing Lotion, Normal Skin  Curel Age Defying Therapeutic Moisturizing Lotion with Alpha Hydroxy  Curel Extreme Care Body Lotion  Curel Soothing Hands Moisturizing Hand Lotion  Curel Therapeutic Moisturizing Cream, Fragrance-Free  Curel Therapeutic Moisturizing Lotion, Fragrance-Free  Curel Therapeutic Moisturizing Lotion, Original Formula  Eucerin Daily Replenishing Lotion  Eucerin Dry Skin Therapy Plus Alpha Hydroxy Crme  Eucerin Dry Skin Therapy Plus Alpha Hydroxy Lotion  Eucerin Original Crme  Eucerin Original Lotion  Eucerin Plus Crme Eucerin Plus Lotion  Eucerin TriLipid Replenishing Lotion  Keri Anti-Bacterial Hand Lotion  Keri Deep Conditioning Original Lotion Dry Skin Formula Softly Scented  Keri  Deep Conditioning Original Lotion, Fragrance Free Sensitive Skin Formula  Keri Lotion Fast Absorbing Fragrance Free Sensitive Skin Formula  Keri Lotion Fast Absorbing Softly Scented Dry Skin Formula  Keri Original Lotion  Keri Skin Renewal Lotion Keri Silky Smooth Lotion  Keri Silky Smooth Sensitive Skin Lotion  Nivea Body Creamy Conditioning Oil  Nivea Body Extra Enriched Teacher, adult education Moisturizing Lotion Nivea Crme  Nivea Skin Firming Lotion  NutraDerm 30 Skin Lotion  NutraDerm Skin Lotion  NutraDerm Therapeutic Skin Cream  NutraDerm Therapeutic Skin Lotion  ProShield Protective Hand Cream  Provon moisturizing lotion

## 2022-11-01 ENCOUNTER — Encounter: Payer: Self-pay | Admitting: Neurosurgery

## 2022-11-02 ENCOUNTER — Encounter: Payer: Self-pay | Admitting: Surgery

## 2022-11-02 ENCOUNTER — Ambulatory Visit (INDEPENDENT_AMBULATORY_CARE_PROVIDER_SITE_OTHER): Payer: No Typology Code available for payment source | Admitting: Surgery

## 2022-11-02 ENCOUNTER — Ambulatory Visit: Payer: No Typology Code available for payment source | Admitting: Family Medicine

## 2022-11-02 VITALS — BP 117/80 | HR 71 | Temp 98.0°F | Ht 66.0 in | Wt 256.0 lb

## 2022-11-02 DIAGNOSIS — D1721 Benign lipomatous neoplasm of skin and subcutaneous tissue of right arm: Secondary | ICD-10-CM

## 2022-11-02 DIAGNOSIS — Z09 Encounter for follow-up examination after completed treatment for conditions other than malignant neoplasm: Secondary | ICD-10-CM

## 2022-11-02 NOTE — Patient Instructions (Addendum)
You may use heat or ice to the area.  May rub Vitamin-E oil or other emmolient agent in area 2-3 times a day to soften. You will need to use sunscreen for the next year on the area to minimize altered pigmentation of the site.   Follow-up with our office as needed.  Call when you are ready to have the back lipoma removed.  Please call and ask to speak with a nurse if you develop questions or concerns.

## 2022-11-02 NOTE — Progress Notes (Signed)
11/02/2022  HPI: Margaret Cuevas is a 47 y.o. female s/p excision of two right forearm lipomas on 10/23/22.  Patient presents for follow up.  She developed a hematoma with ecchymosis in the medial aspect of the right forearm, associated with a bit of leakage of fluid from the medial incision.  However, this sealed up on its own and the hematoma is almost reabsorbed.  The more lateral incision is healing well.  Denies any worsening issues with either incision.   Vital signs: BP 117/80   Pulse 71   Temp 98 F (36.7 C)   Ht 5\' 6"  (1.676 m)   Wt 256 lb (116.1 kg)   SpO2 98%   BMI 41.32 kg/m    Physical Exam: Constitutional:  No acute distress Skin:  Right forearm incisions are healing well, dry, intact.  There is minimal residual ecchymosis in the medial aspect of the forearm, but no fluid collection noted.  No evidence of infection.  There is palpable firmness deep to each incision corresponding to scar tissue and inflammatory changes from the hematoma.  Assessment/Plan: This is a 47 y.o. female s/p excision of two right forearm lipomas.  --Reviewed pathology with patient showing lipomas, without any suspicious findings. --Discussed with the patient that the ecchymosis will continue to reabsorb and the firmness deep to each incision will continue to improve and soften with time.  She may start applying vitamin E lotion or cocoa butter lotion to help with the scar. --She has a lipoma in her upper back that she's interested in removing in the future. She has back surgery next week and wants to fully recover from the prior to any other procedures.  She will call us in the future to schedule.   Howie Ill, MD [facility]

## 2022-11-09 ENCOUNTER — Inpatient Hospital Stay: Payer: No Typology Code available for payment source | Admitting: Urgent Care

## 2022-11-09 ENCOUNTER — Ambulatory Visit: Payer: No Typology Code available for payment source | Admitting: Family Medicine

## 2022-11-09 ENCOUNTER — Inpatient Hospital Stay: Payer: No Typology Code available for payment source

## 2022-11-09 ENCOUNTER — Inpatient Hospital Stay
Admission: RE | Admit: 2022-11-09 | Discharge: 2022-11-11 | DRG: 454 | Disposition: A | Discharge status: Home Health | Payer: No Typology Code available for payment source | Attending: Neurosurgery | Admitting: Neurosurgery

## 2022-11-09 ENCOUNTER — Encounter
Admission: RE | Disposition: A | Discharge status: Home Health | Payer: Self-pay | Source: Home / Self Care | Attending: Neurosurgery

## 2022-11-09 ENCOUNTER — Other Ambulatory Visit: Payer: Self-pay

## 2022-11-09 DIAGNOSIS — Z981 Arthrodesis status: Principal | ICD-10-CM

## 2022-11-09 DIAGNOSIS — Z01818 Encounter for other preprocedural examination: Secondary | ICD-10-CM

## 2022-11-09 DIAGNOSIS — Z9049 Acquired absence of other specified parts of digestive tract: Secondary | ICD-10-CM | POA: Diagnosis not present

## 2022-11-09 DIAGNOSIS — Z79899 Other long term (current) drug therapy: Secondary | ICD-10-CM

## 2022-11-09 DIAGNOSIS — Z6841 Body Mass Index (BMI) 40.0 and over, adult: Secondary | ICD-10-CM

## 2022-11-09 DIAGNOSIS — M5416 Radiculopathy, lumbar region: Secondary | ICD-10-CM

## 2022-11-09 DIAGNOSIS — F419 Anxiety disorder, unspecified: Secondary | ICD-10-CM | POA: Diagnosis present

## 2022-11-09 DIAGNOSIS — M5116 Intervertebral disc disorders with radiculopathy, lumbar region: Secondary | ICD-10-CM | POA: Diagnosis present

## 2022-11-09 DIAGNOSIS — F17210 Nicotine dependence, cigarettes, uncomplicated: Secondary | ICD-10-CM | POA: Diagnosis present

## 2022-11-09 DIAGNOSIS — M797 Fibromyalgia: Secondary | ICD-10-CM | POA: Diagnosis present

## 2022-11-09 DIAGNOSIS — Z8249 Family history of ischemic heart disease and other diseases of the circulatory system: Secondary | ICD-10-CM

## 2022-11-09 DIAGNOSIS — Z9884 Bariatric surgery status: Secondary | ICD-10-CM

## 2022-11-09 DIAGNOSIS — G4733 Obstructive sleep apnea (adult) (pediatric): Secondary | ICD-10-CM | POA: Diagnosis present

## 2022-11-09 DIAGNOSIS — M48061 Spinal stenosis, lumbar region without neurogenic claudication: Secondary | ICD-10-CM | POA: Diagnosis present

## 2022-11-09 DIAGNOSIS — K219 Gastro-esophageal reflux disease without esophagitis: Secondary | ICD-10-CM | POA: Diagnosis present

## 2022-11-09 DIAGNOSIS — M5126 Other intervertebral disc displacement, lumbar region: Secondary | ICD-10-CM | POA: Diagnosis not present

## 2022-11-09 DIAGNOSIS — M62830 Muscle spasm of back: Secondary | ICD-10-CM

## 2022-11-09 DIAGNOSIS — Z90710 Acquired absence of both cervix and uterus: Secondary | ICD-10-CM | POA: Diagnosis not present

## 2022-11-09 DIAGNOSIS — Z886 Allergy status to analgesic agent status: Secondary | ICD-10-CM | POA: Diagnosis not present

## 2022-11-09 HISTORY — PX: APPLICATION OF INTRAOPERATIVE CT SCAN: SHX6668

## 2022-11-09 HISTORY — PX: ANTERIOR LATERAL LUMBAR FUSION WITH PERCUTANEOUS SCREW 1 LEVEL: SHX5553

## 2022-11-09 SURGERY — ANTERIOR LATERAL LUMBAR FUSION WITH PERCUTANEOUS SCREW 1 LEVEL
Anesthesia: General | Laterality: Right

## 2022-11-09 MED ORDER — BUSPIRONE HCL 15 MG PO TABS
7.5000 mg | ORAL_TABLET | Freq: Three times a day (TID) | ORAL | Status: DC
  Administered 2022-11-10 – 2022-11-11 (×5): 7.5 mg via ORAL
  Filled 2022-11-09 (×6): qty 1

## 2022-11-09 MED ORDER — BUPIVACAINE-EPINEPHRINE (PF) 0.5% -1:200000 IJ SOLN
INTRAMUSCULAR | Status: AC
  Filled 2022-11-09: qty 20

## 2022-11-09 MED ORDER — SODIUM CHLORIDE 0.9% FLUSH
3.0000 mL | Freq: Two times a day (BID) | INTRAVENOUS | Status: DC
  Administered 2022-11-10 – 2022-11-11 (×3): 3 mL via INTRAVENOUS

## 2022-11-09 MED ORDER — OXYCODONE HCL 5 MG/5ML PO SOLN: 5.0000 mg | Freq: Once | ORAL | Status: DC | PRN

## 2022-11-09 MED ORDER — CYCLOBENZAPRINE HCL 10 MG PO TABS
10.0000 mg | ORAL_TABLET | Freq: Three times a day (TID) | ORAL | Status: DC
  Administered 2022-11-09 – 2022-11-11 (×5): 10 mg via ORAL
  Filled 2022-11-09 (×4): qty 1

## 2022-11-09 MED ORDER — MAGNESIUM CITRATE PO SOLN: 1.0000 | Freq: Once | ORAL | Status: DC | PRN

## 2022-11-09 MED ORDER — BUPIVACAINE-EPINEPHRINE 0.5% -1:200000 IJ SOLN
INTRAMUSCULAR | Status: DC | PRN
  Administered 2022-11-09: 7 mL
  Administered 2022-11-09: 6 mL

## 2022-11-09 MED ORDER — OXYCODONE HCL 5 MG PO TABS: 5.0000 mg | ORAL_TABLET | Freq: Once | ORAL | Status: DC | PRN

## 2022-11-09 MED ORDER — CHLORHEXIDINE GLUCONATE 0.12 % MT SOLN
OROMUCOSAL | Status: AC
  Filled 2022-11-09: qty 15

## 2022-11-09 MED ORDER — REMIFENTANIL HCL 1 MG IV SOLR
INTRAVENOUS | Status: AC
  Filled 2022-11-09: qty 1000

## 2022-11-09 MED ORDER — HYDROMORPHONE HCL 1 MG/ML IJ SOLN
INTRAMUSCULAR | Status: AC
  Filled 2022-11-09: qty 1

## 2022-11-09 MED ORDER — VANCOMYCIN HCL IN DEXTROSE 1-5 GM/200ML-% IV SOLN
1000.0000 mg | Freq: Once | INTRAVENOUS | Status: AC
  Administered 2022-11-09: 1000 mg via INTRAVENOUS

## 2022-11-09 MED ORDER — SUCCINYLCHOLINE CHLORIDE 200 MG/10ML IV SOSY
PREFILLED_SYRINGE | INTRAVENOUS | Status: DC | PRN
  Administered 2022-11-09: 140 mg via INTRAVENOUS

## 2022-11-09 MED ORDER — SODIUM CHLORIDE 0.9% FLUSH: 3.0000 mL | INTRAVENOUS | Status: DC | PRN

## 2022-11-09 MED ORDER — OXYCODONE HCL 5 MG PO TABS
ORAL_TABLET | ORAL | Status: AC
  Filled 2022-11-09: qty 2

## 2022-11-09 MED ORDER — CEFAZOLIN SODIUM-DEXTROSE 2-4 GM/100ML-% IV SOLN
INTRAVENOUS | Status: AC
  Filled 2022-11-09: qty 100

## 2022-11-09 MED ORDER — ACETAMINOPHEN 500 MG PO TABS
ORAL_TABLET | ORAL | Status: AC
  Filled 2022-11-09: qty 2

## 2022-11-09 MED ORDER — FAMOTIDINE 20 MG PO TABS
20.0000 mg | ORAL_TABLET | Freq: Once | ORAL | Status: AC
  Administered 2022-11-09: 20 mg via ORAL

## 2022-11-09 MED ORDER — HYDROMORPHONE HCL 1 MG/ML IJ SOLN
0.2500 mg | INTRAMUSCULAR | Status: DC | PRN
  Administered 2022-11-09 (×4): 0.5 mg via INTRAVENOUS

## 2022-11-09 MED ORDER — SODIUM CHLORIDE 0.9 % IV SOLN: 250.0000 mL | INTRAVENOUS | Status: DC

## 2022-11-09 MED ORDER — DEXAMETHASONE SODIUM PHOSPHATE 10 MG/ML IJ SOLN
INTRAMUSCULAR | Status: DC | PRN
  Administered 2022-11-09: 10 mg via INTRAVENOUS

## 2022-11-09 MED ORDER — CHLORHEXIDINE GLUCONATE 0.12 % MT SOLN
15.0000 mL | Freq: Once | OROMUCOSAL | Status: AC
  Administered 2022-11-09: 15 mL via OROMUCOSAL

## 2022-11-09 MED ORDER — FENTANYL CITRATE (PF) 100 MCG/2ML IJ SOLN
INTRAMUSCULAR | Status: AC
  Filled 2022-11-09: qty 2

## 2022-11-09 MED ORDER — SODIUM CHLORIDE FLUSH 0.9 % IV SOLN
INTRAVENOUS | Status: AC
  Filled 2022-11-09: qty 20

## 2022-11-09 MED ORDER — LACTATED RINGERS IV SOLN: INTRAVENOUS | Status: DC

## 2022-11-09 MED ORDER — FENTANYL CITRATE (PF) 100 MCG/2ML IJ SOLN
INTRAMUSCULAR | Status: DC | PRN
  Administered 2022-11-09: 50 ug via INTRAVENOUS
  Administered 2022-11-09 (×2): 25 ug via INTRAVENOUS

## 2022-11-09 MED ORDER — REMIFENTANIL HCL 1 MG IV SOLR: INTRAVENOUS | Status: DC | PRN

## 2022-11-09 MED ORDER — KETOROLAC TROMETHAMINE 15 MG/ML IJ SOLN
INTRAMUSCULAR | Status: AC
  Filled 2022-11-09: qty 1

## 2022-11-09 MED ORDER — BUPIVACAINE HCL (PF) 0.5 % IJ SOLN
INTRAMUSCULAR | Status: AC
  Filled 2022-11-09: qty 30

## 2022-11-09 MED ORDER — ORAL CARE MOUTH RINSE: 15.0000 mL | Freq: Once | OROMUCOSAL | Status: AC

## 2022-11-09 MED ORDER — CEFAZOLIN SODIUM-DEXTROSE 2-4 GM/100ML-% IV SOLN: 2.0000 g | Freq: Once | INTRAVENOUS | Status: DC

## 2022-11-09 MED ORDER — HYDROMORPHONE HCL 1 MG/ML IJ SOLN
1.0000 mg | Freq: Once | INTRAMUSCULAR | Status: AC
  Administered 2022-11-09: 1 mg via INTRAVENOUS
  Filled 2022-11-09: qty 1

## 2022-11-09 MED ORDER — DOCUSATE SODIUM 100 MG PO CAPS
100.0000 mg | ORAL_CAPSULE | Freq: Two times a day (BID) | ORAL | Status: DC
  Administered 2022-11-09 – 2022-11-11 (×4): 100 mg via ORAL
  Filled 2022-11-09 (×4): qty 1

## 2022-11-09 MED ORDER — OXYCODONE HCL 5 MG PO TABS
10.0000 mg | ORAL_TABLET | ORAL | Status: DC | PRN
  Administered 2022-11-09 – 2022-11-11 (×9): 10 mg via ORAL
  Filled 2022-11-09 (×8): qty 2

## 2022-11-09 MED ORDER — SODIUM CHLORIDE (PF) 0.9 % IJ SOLN
INTRAMUSCULAR | Status: DC | PRN
  Administered 2022-11-09: 60 mL

## 2022-11-09 MED ORDER — GABAPENTIN 300 MG PO CAPS
900.0000 mg | ORAL_CAPSULE | Freq: Three times a day (TID) | ORAL | Status: DC
  Administered 2022-11-09 – 2022-11-11 (×5): 900 mg via ORAL
  Filled 2022-11-09 (×4): qty 3

## 2022-11-09 MED ORDER — ACETAMINOPHEN 500 MG PO TABS
1000.0000 mg | ORAL_TABLET | Freq: Four times a day (QID) | ORAL | Status: AC
  Administered 2022-11-09 – 2022-11-10 (×4): 1000 mg via ORAL
  Filled 2022-11-09 (×3): qty 2

## 2022-11-09 MED ORDER — ENOXAPARIN SODIUM 40 MG/0.4ML IJ SOSY
40.0000 mg | PREFILLED_SYRINGE | INTRAMUSCULAR | Status: DC
  Administered 2022-11-10 – 2022-11-11 (×2): 40 mg via SUBCUTANEOUS
  Filled 2022-11-09 (×2): qty 0.4

## 2022-11-09 MED ORDER — CYCLOBENZAPRINE HCL 10 MG PO TABS
10.0000 mg | ORAL_TABLET | Freq: Three times a day (TID) | ORAL | Status: DC
  Filled 2022-11-09 (×2): qty 1

## 2022-11-09 MED ORDER — DIAZEPAM 5 MG PO TABS
5.0000 mg | ORAL_TABLET | Freq: Once | ORAL | Status: AC
  Administered 2022-11-09: 5 mg via ORAL
  Filled 2022-11-09: qty 1

## 2022-11-09 MED ORDER — SURGIPHOR WOUND IRRIGATION SYSTEM - OPTIME
TOPICAL | Status: DC | PRN
  Administered 2022-11-09: 200 mL via TOPICAL

## 2022-11-09 MED ORDER — MORPHINE SULFATE (PF) 2 MG/ML IV SOLN
1.0000 mg | INTRAVENOUS | Status: DC | PRN
  Administered 2022-11-10: 1 mg via INTRAVENOUS
  Filled 2022-11-09: qty 1

## 2022-11-09 MED ORDER — SENNA 8.6 MG PO TABS
1.0000 | ORAL_TABLET | Freq: Two times a day (BID) | ORAL | Status: DC
  Administered 2022-11-09 – 2022-11-11 (×4): 8.6 mg via ORAL
  Filled 2022-11-09 (×4): qty 1

## 2022-11-09 MED ORDER — KETAMINE HCL 10 MG/ML IJ SOLN
INTRAMUSCULAR | Status: DC | PRN
  Administered 2022-11-09: 20 mg via INTRAVENOUS
  Administered 2022-11-09: 30 mg via INTRAVENOUS

## 2022-11-09 MED ORDER — GABAPENTIN 300 MG PO CAPS: 900.0000 mg | ORAL_CAPSULE | Freq: Three times a day (TID) | ORAL | Status: DC

## 2022-11-09 MED ORDER — PROPOFOL 1000 MG/100ML IV EMUL
INTRAVENOUS | Status: AC
  Filled 2022-11-09: qty 100

## 2022-11-09 MED ORDER — 0.9 % SODIUM CHLORIDE (POUR BTL) OPTIME
TOPICAL | Status: DC | PRN
  Administered 2022-11-09: 1000 mL

## 2022-11-09 MED ORDER — REMIFENTANIL HCL 1 MG IV SOLR
INTRAVENOUS | Status: DC | PRN
  Administered 2022-11-09: .08 ug/kg/min via INTRAVENOUS

## 2022-11-09 MED ORDER — BISACODYL 10 MG RE SUPP: 10.0000 mg | Freq: Every day | RECTAL | Status: DC | PRN

## 2022-11-09 MED ORDER — OXYCODONE HCL 5 MG PO TABS: 5.0000 mg | ORAL_TABLET | ORAL | Status: DC | PRN

## 2022-11-09 MED ORDER — ONDANSETRON HCL 4 MG PO TABS: 4.0000 mg | ORAL_TABLET | Freq: Four times a day (QID) | ORAL | Status: DC | PRN

## 2022-11-09 MED ORDER — GABAPENTIN 300 MG PO CAPS
ORAL_CAPSULE | ORAL | Status: AC
  Filled 2022-11-09: qty 3

## 2022-11-09 MED ORDER — LIDOCAINE HCL (CARDIAC) PF 100 MG/5ML IV SOSY
PREFILLED_SYRINGE | INTRAVENOUS | Status: DC | PRN
  Administered 2022-11-09: 100 mg via INTRAVENOUS

## 2022-11-09 MED ORDER — MIDAZOLAM HCL 2 MG/2ML IJ SOLN
INTRAMUSCULAR | Status: AC
  Filled 2022-11-09: qty 2

## 2022-11-09 MED ORDER — PROPOFOL 10 MG/ML IV BOLUS
INTRAVENOUS | Status: DC | PRN
Start: 2022-11-09 — End: 2022-11-09
  Administered 2022-11-09: 150 mg via INTRAVENOUS

## 2022-11-09 MED ORDER — ONDANSETRON HCL 4 MG/2ML IJ SOLN
4.0000 mg | Freq: Four times a day (QID) | INTRAMUSCULAR | Status: DC | PRN
  Administered 2022-11-10: 4 mg via INTRAVENOUS
  Filled 2022-11-09: qty 2

## 2022-11-09 MED ORDER — PHENOL 1.4 % MT LIQD: 1.0000 | OROMUCOSAL | Status: DC | PRN

## 2022-11-09 MED ORDER — KETAMINE HCL 50 MG/5ML IJ SOSY
PREFILLED_SYRINGE | INTRAMUSCULAR | Status: AC
  Filled 2022-11-09: qty 5

## 2022-11-09 MED ORDER — VANCOMYCIN HCL IN DEXTROSE 1-5 GM/200ML-% IV SOLN
INTRAVENOUS | Status: AC
  Filled 2022-11-09: qty 200

## 2022-11-09 MED ORDER — SURGIFLO WITH THROMBIN (HEMOSTATIC MATRIX KIT) OPTIME
TOPICAL | Status: DC | PRN
  Administered 2022-11-09: 1 via TOPICAL

## 2022-11-09 MED ORDER — MENTHOL 3 MG MT LOZG: 1.0000 | LOZENGE | OROMUCOSAL | Status: DC | PRN

## 2022-11-09 MED ORDER — POLYETHYLENE GLYCOL 3350 17 G PO PACK: 17.0000 g | PACK | Freq: Every day | ORAL | Status: DC | PRN

## 2022-11-09 MED ORDER — BUPIVACAINE LIPOSOME 1.3 % IJ SUSP
INTRAMUSCULAR | Status: AC
  Filled 2022-11-09: qty 20

## 2022-11-09 MED ORDER — DULOXETINE HCL 30 MG PO CPEP
60.0000 mg | ORAL_CAPSULE | ORAL | Status: DC
  Administered 2022-11-10 – 2022-11-11 (×2): 60 mg via ORAL
  Filled 2022-11-09 (×3): qty 2

## 2022-11-09 MED ORDER — SODIUM CHLORIDE 0.9 % IV SOLN: INTRAVENOUS | Status: DC

## 2022-11-09 MED ORDER — FAMOTIDINE 20 MG PO TABS
ORAL_TABLET | ORAL | Status: AC
  Filled 2022-11-09: qty 1

## 2022-11-09 MED ORDER — KETOROLAC TROMETHAMINE 15 MG/ML IJ SOLN
15.0000 mg | Freq: Four times a day (QID) | INTRAMUSCULAR | Status: AC
  Administered 2022-11-09 – 2022-11-10 (×4): 15 mg via INTRAVENOUS
  Filled 2022-11-09 (×3): qty 1

## 2022-11-09 MED ORDER — MIDAZOLAM HCL 2 MG/2ML IJ SOLN
INTRAMUSCULAR | Status: DC | PRN
  Administered 2022-11-09: 2 mg via INTRAVENOUS

## 2022-11-09 SURGICAL SUPPLY — 90 items
ADH SKN CLS APL DERMABOND .7 (GAUZE/BANDAGES/DRESSINGS) ×6
AGENT HMST KT MTR STRL THRMB (HEMOSTASIS) ×2
ALLOGRAFT BONE FIBER KORE 5 (Bone Implant) IMPLANT
APL PRP STRL LF DISP 70% ISPRP (MISCELLANEOUS) ×4
BASIN KIT SINGLE STR (MISCELLANEOUS) ×2 IMPLANT
BULB RESERV EVAC DRAIN JP 100C (MISCELLANEOUS) IMPLANT
BUR NEURO DRILL SOFT 3.0X3.8M (BURR) ×2 IMPLANT
CHLORAPREP W/TINT 26 (MISCELLANEOUS) ×4 IMPLANT
CNTNR URN SCR LID CUP LEK RST (MISCELLANEOUS) ×2 IMPLANT
CONT SPEC 4OZ STRL OR WHT (MISCELLANEOUS) ×2
CORD BIP STRL DISP 12FT (MISCELLANEOUS) ×2 IMPLANT
CORD LIGHT LATERIAL X LIFT (MISCELLANEOUS) IMPLANT
COVER BACK TABLE REUSABLE LG (DRAPES) ×2 IMPLANT
COVERAGE SUPP BRAINLAB NG SPNE (MISCELLANEOUS) IMPLANT
COVERAGE SUPPORT SPINE BRAINLB (MISCELLANEOUS)
DERMABOND ADVANCED .7 DNX12 (GAUZE/BANDAGES/DRESSINGS) ×6 IMPLANT
DRAIN JP 10F RND RADIO (DRAIN) IMPLANT
DRAPE C ARM PK CFD 31 SPINE (DRAPES) ×2 IMPLANT
DRAPE C-ARMOR (DRAPES) IMPLANT
DRAPE HD 5FT BACK TABLE (DRAPES) IMPLANT
DRAPE INCISE IOBAN 66X45 STRL (DRAPES) ×2 IMPLANT
DRAPE LAPAROTOMY 100X77 ABD (DRAPES) ×4 IMPLANT
DRAPE MICROSCOPE SPINE 48X150 (DRAPES) IMPLANT
DRAPE POUCH INSTRU U-SHP 10X18 (DRAPES) ×2 IMPLANT
DRAPE SCAN PATIENT (DRAPES) ×2 IMPLANT
DRSG OPSITE POSTOP 4X6 (GAUZE/BANDAGES/DRESSINGS) IMPLANT
DRSG TEGADERM 2-3/8X2-3/4 SM (GAUZE/BANDAGES/DRESSINGS) IMPLANT
DRSG TEGADERM 4X4.75 (GAUZE/BANDAGES/DRESSINGS) IMPLANT
DRSG TEGADERM 6X8 (GAUZE/BANDAGES/DRESSINGS) IMPLANT
ELECT EZSTD 165MM 6.5IN (MISCELLANEOUS) ×2
ELECT REM PT RETURN 9FT ADLT (ELECTROSURGICAL) ×4
ELECTRODE EZSTD 165MM 6.5IN (MISCELLANEOUS) ×2 IMPLANT
ELECTRODE REM PT RTRN 9FT ADLT (ELECTROSURGICAL) ×4 IMPLANT
EX-PIN ORTHOLOCK NAV 4X150 (PIN) ×2 IMPLANT
FEE CVG SUPP BRAINLAB NG SPNE (MISCELLANEOUS) IMPLANT
FEE INTRAOP CADWELL SUPPLY NCS (MISCELLANEOUS) ×2 IMPLANT
FEE INTRAOP MONITOR IMPULS NCS (MISCELLANEOUS) ×2 IMPLANT
FORCEPS BPLR BAYO 10IN 1.0TIP (ORTHOPEDIC DISPOSABLE SUPPLIES) IMPLANT
GAUZE 4X4 16PLY ~~LOC~~+RFID DBL (SPONGE) IMPLANT
GAUZE SPONGE 2X2 STRL 8-PLY (GAUZE/BANDAGES/DRESSINGS) IMPLANT
GLOVE BIOGEL PI IND STRL 6.5 (GLOVE) ×6 IMPLANT
GLOVE SURG SYN 6.5 ES PF (GLOVE) ×10 IMPLANT
GLOVE SURG SYN 6.5 PF PI (GLOVE) ×10 IMPLANT
GLOVE SURG SYN 8.5 E (GLOVE) ×12 IMPLANT
GLOVE SURG SYN 8.5 PF PI (GLOVE) ×12 IMPLANT
GOWN SRG LRG LVL 4 IMPRV REINF (GOWNS) ×4 IMPLANT
GOWN SRG XL LVL 3 NONREINFORCE (GOWNS) ×4 IMPLANT
GOWN STRL NON-REIN TWL XL LVL3 (GOWNS) ×4
GOWN STRL REIN LRG LVL4 (GOWNS) ×4
GUIDEWIRE NITINOL BEVEL TIP (WIRE) IMPLANT
HOLDER FOLEY CATH W/STRAP (MISCELLANEOUS) ×2 IMPLANT
INTRAOP CADWELL SUPPLY FEE NCS (MISCELLANEOUS)
INTRAOP DISP SUPPLY FEE NCS (MISCELLANEOUS)
INTRAOP MONITOR FEE IMPULS NCS (MISCELLANEOUS)
KIT DILATOR XLIF 5 (KITS) IMPLANT
KIT DISP MARS 3V (KITS) IMPLANT
KIT SPINAL PRONEVIEW (KITS) ×2 IMPLANT
KIT TURNOVER KIT A (KITS) ×2 IMPLANT
KNIFE BAYONET SHORT DISCETOMY (MISCELLANEOUS) IMPLANT
MANIFOLD NEPTUNE II (INSTRUMENTS) ×4 IMPLANT
MARKER SKIN DUAL TIP RULER LAB (MISCELLANEOUS) ×4 IMPLANT
MARKER SPHERE PSV REFLC 13MM (MARKER) ×14 IMPLANT
MODULE NVM5 NEXT GEN EMG (NEUROSURGERY SUPPLIES) IMPLANT
NDL SAFETY ECLIP 18X1.5 (MISCELLANEOUS) ×2 IMPLANT
NS IRRIG 1000ML POUR BTL (IV SOLUTION) ×2 IMPLANT
PACK LAMINECTOMY ARMC (PACKS) ×2 IMPLANT
PAD ARMBOARD 7.5X6 YLW CONV (MISCELLANEOUS) ×2 IMPLANT
PENCIL SMOKE EVACUATOR (MISCELLANEOUS) ×2 IMPLANT
ROD RELINE MAS TI LORD 5.5X40 (Rod) IMPLANT
SCREW LOCK RELINE 5.5 TULIP (Screw) IMPLANT
SCREW RELINE RED 6.5X45MM POLY (Screw) IMPLANT
SOLUTION IRRIG SURGIPHOR (IV SOLUTION) ×2 IMPLANT
SPACER HEDRON 10D 18X45X13 (Spacer) IMPLANT
STAPLER SKIN PROX 35W (STAPLE) IMPLANT
SURGIFLO W/THROMBIN 8M KIT (HEMOSTASIS) ×2 IMPLANT
SUT DVC VLOC 3-0 CL 6 P-12 (SUTURE) ×2 IMPLANT
SUT ETHILON 3-0 FS-10 30 BLK (SUTURE) ×2
SUT VIC AB 0 CT1 27 (SUTURE) ×6
SUT VIC AB 0 CT1 27XCR 8 STRN (SUTURE) ×2 IMPLANT
SUT VIC AB 2-0 CT1 18 (SUTURE) ×2 IMPLANT
SUTURE EHLN 3-0 FS-10 30 BLK (SUTURE) IMPLANT
SYR 30ML LL (SYRINGE) ×4 IMPLANT
SYR 3ML LL SCALE MARK (SYRINGE) ×2 IMPLANT
TAPE CLOTH 3X10 WHT NS LF (GAUZE/BANDAGES/DRESSINGS) ×8 IMPLANT
TOWEL OR 17X26 4PK STRL BLUE (TOWEL DISPOSABLE) ×4 IMPLANT
TRAP FLUID SMOKE EVACUATOR (MISCELLANEOUS) ×2 IMPLANT
TRAY FOLEY SLVR 16FR LF STAT (SET/KITS/TRAYS/PACK) IMPLANT
TUBING CONNECTING 10 (TUBING) ×2 IMPLANT
WATER STERILE IRR 1000ML POUR (IV SOLUTION) ×4 IMPLANT
WATER STERILE IRR 500ML POUR (IV SOLUTION) IMPLANT

## 2022-11-09 NOTE — Interval H&P Note (Signed)
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  The various methods of treatment have been discussed with the patient and family. After consideration of risks, benefits and other options for treatment, the patient has consented to  Procedure(s): L4-5 LATERAL LUMBAR INTERBODY FUSION WITH POSTERIOR SPINAL FUSION (N/A) RIGHT L5-S1 FAR LATERAL FORAMINOTOMY (Right) APPLICATION OF INTRAOPERATIVE CT SCAN (N/A) as a surgical intervention.  The patient's history has been reviewed, patient examined, no change in status, stable for surgery.  I have reviewed the patient's chart and labs.  Questions were answered to the patient's satisfaction.    Heart sounds normal no MRG. Chest Clear to Auscultation Bilaterally.    Harper Smoker

## 2022-11-09 NOTE — Transfer of Care (Signed)
Immediate Anesthesia Transfer of Care Note  Patient: Ana Woodroof  Procedure(s) Performed: L4-5 LATERAL LUMBAR INTERBODY FUSION WITH POSTERIOR SPINAL FUSION RIGHT L5-S1 FAR LATERAL FORAMINOTOMY (Right) APPLICATION OF INTRAOPERATIVE CT SCAN  Patient Location: PACU  Anesthesia Type:General  Level of Consciousness: awake, alert , and oriented  Airway & Oxygen Therapy: Patient Spontanous Breathing, Patient connected to nasal cannula oxygen, and Patient connected to face mask oxygen  Post-op Assessment: Report given to RN and Post -op Vital signs reviewed and stable  Post vital signs: Reviewed and stable  Last Vitals:  Vitals Value Taken Time  BP 160/107 11/09/22 1853  Temp    Pulse 104 11/09/22 1858  Resp 33 11/09/22 1858  SpO2 97 % 11/09/22 1858  Vitals shown include unfiled device data.  Last Pain:  Vitals:   11/09/22 1412  TempSrc: Oral         Complications: No notable events documented.

## 2022-11-09 NOTE — Anesthesia Preprocedure Evaluation (Addendum)
Anesthesia Evaluation  Patient identified by MRN, date of birth, ID band Patient awake    Reviewed: Allergy & Precautions, NPO status , Patient's Chart, lab work & pertinent test results  History of Anesthesia Complications (+) history of anesthetic complications  Airway Mallampati: II  TM Distance: >3 FB Neck ROM: full    Dental no notable dental hx.    Pulmonary asthma , sleep apnea and Continuous Positive Airway Pressure Ventilation , Current Smoker and Patient abstained from smoking.   Pulmonary exam normal        Cardiovascular (-) angina (-) DOE negative cardio ROS Normal cardiovascular exam     Neuro/Psych  PSYCHIATRIC DISORDERS Anxiety      Neuromuscular disease    GI/Hepatic Neg liver ROS, hiatal hernia,GERD  Controlled,,  Endo/Other    Morbid obesity  Renal/GU      Musculoskeletal  (+) Arthritis , Osteoarthritis,  Fibromyalgia -, narcotic dependent  Abdominal   Peds  Hematology  (+) Blood dyscrasia, anemia   Anesthesia Other Findings Past Medical History: No date: Anemia No date: Anginal pain (HCC)     Comment:  work up was benign No date: Anxiety No date: Arthritis     Comment:  right hip No date: Asthma     Comment:  seasonal No date: Chest pain No date: Complication of anesthesia     Comment:  woke up during surgery x 2 No date: Fibromyalgia No date: GERD (gastroesophageal reflux disease) No date: Hiatal hernia 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 date: Lipoma of back No date: Lumbar disc herniation No date: Lumbar radiculopathy No date: Obesity No date: OSA on CPAP No date: Pneumonia  Past Surgical History: No date: ABDOMINAL HYSTERECTOMY No date: ANKLE FRACTURE SURGERY     Comment:  plates and screws placed on both sides No date: APPENDECTOMY No date: BACK SURGERY     Comment:  lumbar L4-5 No date: CESAREAN SECTION No date: CHOLECYSTECTOMY 05/04/2022: COLONOSCOPY; N/A      Comment:  Procedure: COLONOSCOPY;  Surgeon: Jaynie Collins,              DO;  Location: [facility] ENDOSCOPY;  Service:               Gastroenterology;  Laterality: N/A; No date: EAR TUBE REMOVAL 05/04/2022: ESOPHAGOGASTRODUODENOSCOPY; N/A     Comment:  Procedure: ESOPHAGOGASTRODUODENOSCOPY (EGD);  Surgeon:               Jaynie Collins, DO;  Location: [facility] ENDOSCOPY;                Service: Gastroenterology;  Laterality: N/A; 2006: GASTRIC BYPASS No date: ORIF FINGER / THUMB FRACTURE     Comment:  screws in thumb 08/07/2022: Right RIGHT L4-5 FAR LATERAL DISCECTOMY     Comment:  Dr Myer Haff  BMI    Body Mass Index: 41.32 kg/m      Reproductive/Obstetrics negative OB ROS                             Anesthesia Physical Anesthesia Plan  ASA: 3  Anesthesia Plan: General ETT   Post-op Pain Management: Toradol IV (intra-op)*, Ofirmev IV (intra-op)*, Ketamine IV* and Dilaudid IV   Induction: Intravenous  PONV Risk Score and Plan: 3 and Ondansetron, Dexamethasone, Midazolam and Treatment may vary due to age or medical condition  Airway Management Planned: Oral ETT  Additional Equipment:   Intra-op Plan:   Post-operative  Plan: Extubation in OR  Informed Consent: I have reviewed the patients History and Physical, chart, labs and discussed the procedure including the risks, benefits and alternatives for the proposed anesthesia with the patient or authorized representative who has indicated his/her understanding and acceptance.     Dental Advisory Given  Plan Discussed with: Anesthesiologist, CRNA and Surgeon  Anesthesia Plan Comments: (Patient consented for risks of anesthesia including but not limited to:  - adverse reactions to medications - damage to eyes, teeth, lips or other oral mucosa - nerve damage due to positioning  - sore throat or hoarseness - Damage to heart, brain, nerves, lungs, other parts of body or loss of  life  Patient voiced understanding.)        Anesthesia Quick Evaluation

## 2022-11-09 NOTE — Progress Notes (Signed)
Patient awake/alert x4. Moving all ext.  C/o's pain  baseline pain level 8/10.  Medicated with pain medication as ordered, Dr. Suzan Slick anesthesia aware, also Dr. Myer Haff updated. Dressing c/d/I, with x1 JP drain. Indwelling foley patent.  Patient able to push/bend bil lower ext, right lower ext slightly weaker, pt c/o's bil buttocks pain, per notes same as pre-op.  Family updated.  Repositioned patient freq for comfort. Tolerated water without event.

## 2022-11-09 NOTE — Op Note (Signed)
Indications: [name] is a 47 y.o. 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   Preoperative Note:   Risks of surgery discussed include: infection, bleeding, stroke, coma, death, paralysis, CSF leak, nerve/spinal cord injury, numbness, tingling, weakness, complex regional pain syndrome, recurrent stenosis and/or disc herniation, vascular injury, development of instability, neck/back pain, need for further surgery, persistent symptoms, development of deformity, and the risks of anesthesia. The patient understood these risks and agreed to proceed.  NAME OF ANTERIOR PROCEDURE:               1. Anterior lumbar interbody fusion via a right lateral retroperitoneal approach at L4/5 2. Placement of a Lordotic  Globus Hedron interbody cage, filled with Demineralized Bone Matrix  NAME OF POSTERIOR PROCEDURE 1. Posterior instrumentation using Nuvasive Reline Instrumentation 2. Posterolateral fusion, L4/5, utilizing demineralized bone matrix 3. Use of Stereotaxis 4. Right L5/S1 far lateral foraminotomy   PROCEDURE:  Patient was brought to the operating room, intubated, turned to the lateral position.  All pressure points were checked and double-checked.  The patient was prepped and draped in the standard fashion. Prior to prepping, fluoroscopy was brought in and the patient was positioned with a large bump under the contralateral side between the iliac crest and rib cage, allowing the area  between the iliac crest and the lateral aspect of the rib cage to open and increase the ability to reach inferiorly, to facilitate entry into the disc space.  The incision was marked upon the skin both the location of the disc space as well as the superior most aspect of the iliac crest.  Based on the identification of the disc space an incision was prepared, marked upon the skin and eventually was used for our lateral incision.  The fluoroscopy was turned into a cross table A/P image in order to confirm that the patient's spine remained in a perpendicular trajectory to the floor without rotation.  Once confirming that all the pressure points were checked and double-checked and the patient remained in sturdy position strapped down in this slightly jack-knifed lateral position, the patient was prepped and draped in standard fashion.  The skin was injected with local anesthetic, then incised until the abdominal wall fascia was noted.  I bluntly dissected posteriorly until we were able to identify the posterior musculature near petit's triangle.  At this point, using primarily blunt dissection with our finger aided with a metzenbaum scissor, were able to enter the retroperitoneal cavity.  The retroperitoneal potential space was opened further until palpating out the psoas muscle, the medial aspect of the iliac crest, the medial aspect of the last rib and continued to define the retroperitoneal space with blunt dissection in order to facilitate safe placement of our dilators.    While protecting by dissecting directly onto a finger in the retroperitoneum, the retroperitoneal space was entered safely from the lateral incision and the initial dilator placed onto the muscle belly of the psoas.  While directly stimulating the dilator and after radiographically confirming our location relative to the disc space, I placed  the dilator through the psoas.  The dilators were stimulated to ensure remaining safely away from any  of the lumbar plexus nerves; the dilators were repositioned until no pathologic stimulation was appreciated.  Once I had confirmed the location of our initial dilator radiographically, a K-wire secured the dilator into the L4/5 disc space and confirmed position under A/P and lateral fluoroscopy.  At this point, I dilated up with direct stimulation to confirm lack of pathologic stimulation.  Once all the dilators were in position, I placed in the retractor and secured it onto the table, locked into position and confirmed under A/P and lateral fluoroscopy to confirm our approach angle to the disc space as well as location relative to the disc space.  I then placed the muscle stimulator in through the working channel down to the vertebral body, stimulating the entire lateral surface of the vertebral body and any of the visualized psoas muscle that was adjacent to the retractor, confirming again the safe passage to the psoas before we began performing the discectomy.  At this point, we began our discectomy at L4/5.  The disc was incised laterally throughout the extent of our exposure. Using a combination of pituitary rongeurs, Kerrison rongeurs, rasps, curettes of various sorts, we were able to begin to clean out the disc space.  Once we had cleaned out the majority of the disc space, we then cut the lateral annulus with a cob, breaking the lateral annual attachments on the contralateral side by subtly working the cob through the annulus while using flouroscopy.  Care was taken not to extend further than required after cutting the annular attachments.  After this had been performed, we prepared the endplates for placement of our graft, sized a graft to the disc space by serially dilating up in trial sizes until we confirmed that our graft would be well positioned, allowing distraction while maintaining good grip.  This was confirmed under A/P and lateral fluoroscopy in order to ensure its placement as an eventual  trial for placement of our final graft.  We irrigated with bacteriostatic saline.  Once confirmed placement, the Hedron implant filled with allograft was impacted into position at L4/5.   Through a combination of intradiscal distraction and anterior releasing, we were able to correct the anterior deformity during disc preparation and placement of the graft.  At this point, final radiographs were performed, and we began closure.  The wound was closed using 0 Vicryl interrupted suture in the fascia and 2-0 Vicryl inverted suture were placed in the subcutaneous tissue and dermis. 3-0 monocryl was used for final closure. Dermabond was used to close the skin.    After closing the anterior part in layers, the patient was repositioned into prone position.  All pressure points were checked and double-checked.  The posterior operative site was prepped and draped in standard fashion.  The stereotactic array was placed.  Stereotactic images were acquired using intraoperative CT scanning.  This was registered to the patient.  Using stereotaxis, screw trajectories were planned and incisions made.  The pedicles from L4 to L5 were cannulated bilaterally and K wires used to secure the tracks.  We then utilized a stereotactic screwdriver to place pedicle screws from L4 to L5.  At each level, Nuvasive Reline pedicle screws were placed.  Once the screws were placed, the screw extensions were then linked, a path was formed for the rod and a rod was utilized to connect the screws.  We then compressed, torqued / counter-torqued  and removed the screw assembly. Once performed on each side, the C-arm was brought back and to take confirmatory CT scan showing appropriate placement of all instrumentation and anatomic alignment.    Posterolateral arthrodesis was performed at L4-L5 utilizing demineralized bone matrix.   We then moved to the R L5/S1 foraminotomy. Using the image guidance system, the trajectory was planned just  below the bottom of the right rod.   The Metrx tubes were sequentially advanced until a 18 x 70 mm Metrx tube was placed over the right sacral ala.    The microscope was then sterilely brought into the field and muscle creep was hemostased with a bipolar and resected with a pituitary rongeur.  A Bovie extender was then used to expose the transverse process and lateral facet.  Careful attention was placed to not violate the facet capsule. A 3 mm matchstick drill bit was then used to drill off the top of the transverse process, lateral L5/S1 facet.  Using the image guidance system, the trajectory to the foramen was optimized and confirmed.   Using careful dissection, the superior aspect of the S1 pedicle was identified and exposed down to its junction with the vertebral body.  The disc was identified. The soft tissue compressing the foramen was identified and removed with curettes and rongeurs until the balltip feeler could be easily passed along the course of the right L5 nerve root.    The area was irrigated. The tube system was then removed under microscopic visualization and hemostasis was obtained with a bipolar.     At this point we began our closure.  Each wound was closed using 0 Vicryl interrupted suture in the fascia, 2-0 Vicryl inverted suture were placed in the subcutaneous tissue and dermis. 3-0 monocryl was used for final closure. Dermabond was used to close the skin.    Needle, lap and all counts were correct at the end of the case.    There was no pathologic change in the neuromonitoring during the procedure.   Manning Charity PA assisted in the entire procedure. An assistant was required for this procedure due to the complexity.  The assistant provided assistance in tissue manipulation and suction, and was required for the successful and safe performance of the procedure. I performed the critical portions of the procedure.   Venetia Night MD Neurosurgery

## 2022-11-09 NOTE — Anesthesia Postprocedure Evaluation (Signed)
Anesthesia Post Note  Patient: Margaret Cuevas  Procedure(s) Performed: L4-5 LATERAL LUMBAR INTERBODY FUSION WITH POSTERIOR SPINAL FUSION RIGHT L5-S1 FAR LATERAL FORAMINOTOMY (Right) APPLICATION OF INTRAOPERATIVE CT SCAN  Patient location during evaluation: PACU Anesthesia Type: General Level of consciousness: awake and alert Pain management: pain level controlled Vital Signs Assessment: post-procedure vital signs reviewed and stable Respiratory status: spontaneous breathing, nonlabored ventilation, respiratory function stable and patient connected to nasal cannula oxygen Cardiovascular status: blood pressure returned to baseline and stable Postop Assessment: no apparent nausea or vomiting Anesthetic complications: no   No notable events documented.   Last Vitals:  Vitals:   11/09/22 1949 11/09/22 2019  BP: (!) 126/91 (!) 131/99  Pulse: 84 85  Resp: 16 20  Temp:  36.7 C  SpO2: 96% 100%    Last Pain:  Vitals:   11/09/22 1949  TempSrc:   PainSc: 8                  Corinda Gubler

## 2022-11-09 NOTE — Progress Notes (Signed)
Pt arrived on the floor with family at the bedside. NT is checking vitals and pt is trying to reposition herself on the bed, she heard "something popped" in her back. NT reported to this RN and the PACU RN who is still in the unit. Assessed and patient is AOX4, Moving all extremities, screaming in pain but she said she has Fibromyalgia so she is always in pain. Dr. Myer Haff aware of the "popping" in the pt's back. Verbal order to give another dose of IV pain medicine. Surgical site is intact, no bleeding noted, sensations of the upper and lower extremities present, no numbness or tingling feeling. Tip of the left big toe is a little bit numb as per patient but patient said it is there before the popping feeling happened. Verbal order of valium 5 mg po given as well.Repositioned for comfort. Needs attended. Encouraged family member to stay for comfort. Chaplain is present at the bedside as well.  Personal belongings within easy reach. Call bell within reach. Will continue to monitor.

## 2022-11-09 NOTE — Anesthesia Procedure Notes (Signed)
Procedure Name: Intubation Date/Time: 11/09/2022 3:28 PM  Performed by: Berniece Pap, CRNAPre-anesthesia Checklist: Patient identified, Emergency Drugs available, Suction available and Patient being monitored Patient Re-evaluated:Patient Re-evaluated prior to induction Oxygen Delivery Method: Circle system utilized Preoxygenation: Pre-oxygenation with 100% oxygen Induction Type: IV induction Ventilation: Mask ventilation without difficulty Grade View: Grade I Tube type: Oral Tube size: 7.0 mm Number of attempts: 1 Airway Equipment and Method: Stylet and Oral airway Placement Confirmation: ETT inserted through vocal cords under direct vision, positive ETCO2 and breath sounds checked- equal and bilateral Secured at: 21 cm Tube secured with: Tape Dental Injury: Teeth and Oropharynx as per pre-operative assessment

## 2022-11-10 ENCOUNTER — Encounter: Payer: Self-pay | Admitting: Neurosurgery

## 2022-11-10 MED ORDER — HYDROMORPHONE HCL 1 MG/ML IJ SOLN
0.5000 mg | INTRAMUSCULAR | Status: DC | PRN
  Administered 2022-11-10 – 2022-11-11 (×4): 0.5 mg via INTRAVENOUS
  Filled 2022-11-10 (×4): qty 0.5

## 2022-11-10 MED ORDER — DIAZEPAM 5 MG PO TABS
5.0000 mg | ORAL_TABLET | Freq: Three times a day (TID) | ORAL | Status: AC | PRN
  Administered 2022-11-10 – 2022-11-11 (×3): 5 mg via ORAL
  Filled 2022-11-10 (×3): qty 1

## 2022-11-10 NOTE — Progress Notes (Signed)
 Patient is not able to walk the distance required to go the bathroom, or he/she is unable to safely negotiate stairs required to access the bathroom.  A 3in1 BSC will alleviate this problem  

## 2022-11-10 NOTE — Evaluation (Signed)
Physical Therapy Evaluation Patient Details Name: Margaret Cuevas MRN: [id number] DOB: [birth year] Today's Date: 11/10/2022  History of Present Illness  Patient is a 47 year old female with lumbar radicolopathy, recurrent disc herniation of lumbar disc, spinal stenosis s/p posterolateral fusion L4-5, right L5-S1 far lateral foraminotomy. History of fibromyalgia, anxiety  Clinical Impression  Patient is agreeable to PT which was timed around pain medication schedule. The patient reports significant pain overnight with improvement after medications. She is typically independent with mobility and works. She lives with her spouse and children.  Reviewed general spine precautions and logroll technique. The patient required only minimal assistance for bed mobility. She was able to stand twice, from bed and from recliner with Min guard assistance. She ambulated a lap in the hallway with rolling walker with occasional standing rest breaks secondary to pain. Her pain is generalized with history of fibromyalgia, with specific pain reported in right knee and right hamstring, as well as lower back. Patient did sit in the chair briefly but preferred to return to bed after walking in the hallway for comfort. Recommend to continue PT to maximize independence and to decrease caregiver burden. She reports having 3 weeks of family/friend assistance at home if needed.       Assistance Recommended at Discharge Intermittent Supervision/Assistance  If plan is discharge home, recommend the following:  Can travel by private vehicle  Assist for transportation;Help with stairs or ramp for entrance;Assistance with cooking/housework        Equipment Recommendations Rolling walker (2 wheels);BSC/3in1  Recommendations for Other Services       Functional Status Assessment Patient has had a recent decline in their functional status and demonstrates the ability to make significant improvements in function in a reasonable and  predictable amount of time.     Precautions / Restrictions Precautions Precautions: Back Restrictions Weight Bearing Restrictions: No      Mobility  Bed Mobility Overal bed mobility: Needs Assistance Bed Mobility: Supine to Sit, Sit to Supine     Supine to sit: Min assist Sit to supine: Min assist   General bed mobility comments: assistance for trunk support to sit upright. assistance for BLE support to return to bed. cues for logroll technique    Transfers Overall transfer level: Needs assistance Equipment used: Rolling walker (2 wheels) Transfers: Sit to/from Stand Sit to Stand: Min guard           General transfer comment: 2 standing bouts performed, from bed and from recliner    Ambulation/Gait Ambulation/Gait assistance: Min guard Gait Distance (Feet): 180 Feet Assistive device: Rolling walker (2 wheels) Gait Pattern/deviations: Step-through pattern Gait velocity: decreased     General Gait Details: several standing rest breaks related to right knee/leg pain, back pain also reported. no loss of balance. recommended to contiue using rolling walker for safety  Stairs            Wheelchair Mobility     Tilt Bed    Modified Rankin (Stroke Patients Only)       Balance Overall balance assessment: Needs assistance Sitting-balance support: Feet supported Sitting balance-Leahy Scale: Good     Standing balance support: Bilateral upper extremity supported Standing balance-Leahy Scale: Fair Standing balance comment: no external support required from therapist. brief periods without UE support                             Pertinent Vitals/Pain Pain Assessment Pain Assessment: No/denies pain  Home Living Family/patient expects to be discharged to:: Private residence Living Arrangements: Spouse/significant other;Children Available Help at Discharge: Family;Available 24 hours/day Type of Home: House Home Access: Stairs to  enter Entrance Stairs-Rails: Doctor, general practice of Steps: 5 with rails, 2 without rails   Home Layout: Able to live on main level with bedroom/bathroom Home Equipment: None Additional Comments: adjustable bed    Prior Function Prior Level of Function : Independent/Modified Independent;Working/employed;Driving             Mobility Comments: using a friend's rolling walker recently. ADLs Comments: works in Education officer, environmental. independent ADLs     Hand Dominance        Extremity/Trunk Assessment   Upper Extremity Assessment Upper Extremity Assessment: Defer to OT evaluation    Lower Extremity Assessment Lower Extremity Assessment: Overall WFL for tasks assessed (patient does report R knee pain and posterior R thigh (hamstring area) tightness)       Communication   Communication: No difficulties  Cognition Arousal/Alertness: Awake/alert Behavior During Therapy: WFL for tasks assessed/performed Overall Cognitive Status: Within Functional Limits for tasks assessed                                 General Comments: tearful at times. she is cooperative within the limits of her pain.        General Comments General comments (skin integrity, edema, etc.): educated patient on general spine precuations and logroll technique. patient sat in recliner chair briefly before walking but requested to return to bed for comfort after hallway ambulation    Exercises     Assessment/Plan    PT Assessment Patient needs continued PT services  PT Problem List Decreased range of motion;Decreased activity tolerance;Decreased balance;Decreased mobility;Decreased knowledge of use of DME;Decreased safety awareness       PT Treatment Interventions DME instruction;Gait training;Stair training;Functional mobility training;Therapeutic activities;Therapeutic exercise;Balance training;Neuromuscular re-education;Cognitive remediation;Patient/family education    PT Goals (Current  goals can be found in the Care Plan section)  Acute Rehab PT Goals Patient Stated Goal: to go home tomorrow PT Goal Formulation: With patient Time For Goal Achievement: 11/24/22 Potential to Achieve Goals: Good    Frequency 7X/week     Co-evaluation PT/OT/SLP Co-Evaluation/Treatment: Yes Reason for Co-Treatment: To address functional/ADL transfers PT goals addressed during session: Mobility/safety with mobility OT goals addressed during session: ADL's and self-care       AM-PAC PT "6 Clicks" Mobility  Outcome Measure Help needed turning from your back to your side while in a flat bed without using bedrails?: A Little Help needed moving from lying on your back to sitting on the side of a flat bed without using bedrails?: A Little Help needed moving to and from a bed to a chair (including a wheelchair)?: A Little Help needed standing up from a chair using your arms (e.g., wheelchair or bedside chair)?: A Little Help needed to walk in hospital room?: A Little Help needed climbing 3-5 steps with a railing? : A Little 6 Click Score: 18    End of Session   Activity Tolerance: Patient tolerated treatment well Patient left: in bed;with call bell/phone within reach (ice packs to lower back and pillow support provided to patient's comfort/preference) Nurse Communication: Mobility status PT Visit Diagnosis: Muscle weakness (generalized) (M62.81);Pain Pain - Right/Left:  (middle) Pain - part of body:  (back)    Time: 7564-3329 PT Time Calculation (min) (ACUTE ONLY): 45 min   Charges:  PT Evaluation $PT Eval Low Complexity: 1 Low PT Treatments $Therapeutic Activity: 8-22 mins PT General Charges $$ ACUTE PT VISIT: 1 Visit         Donna Bernard, PT, MPT  Ina [facility] 11/10/2022, 12:37 PM

## 2022-11-10 NOTE — Progress Notes (Signed)
Chaplain offered Sadhana supportive presence during post-op, transition to room and during the night. Pain continues and she is struggling to get comfortable. Tearful at times. Chaplain offered assurance, calm presence, support in moving pillows/ice/wet cloth/ water as Essica attempted to get comfortable. She benefits from calm assurance, non-judgmental presence. After visits she would ask for a hug and shares "I am not a bad person."     11/10/22 0600  Spiritual Encounters  Type of Visit Initial  Care provided to: Pt and family  Conversation partners present during encounter Nurse  Referral source Nurse (RN/NT/LPN)  Spiritual Framework  Presenting Themes Coping tools

## 2022-11-10 NOTE — Progress Notes (Cosign Needed)
   Neurosurgery Progress Note  History: Margaret Cuevas is s/p L4-5 XLIF and PSF. Right L5-S1 far lateral foraminotomy   POD1: Pt reports severe pain this morning largely in her right back around her waist and hip and into her right groin and anterior thigh.  This was improved some with medication.  Physical Exam: Vitals:   11/10/22 0416 11/10/22 0739  BP: 130/87 132/84  Pulse: 73 92  Resp: 20 16  Temp: 98.3 F (36.8 C) 97.6 F (36.4 C)  SpO2: 97% 93%    AA Ox3 CNI  Strength:MAEW JP output 3  Data:  Other tests/results: none  Assessment/Plan:  Margaret Cuevas  is a 47 year old presenting with lumbar disc herniation and lumbar radiculopathy status post L4-5 XLIF and PSF. Right L5-S1 far lateral foraminotomy with worsening pain on postop day 1.  This has improved some with medication changes.  - mobilize - pain control - DVT prophylaxis - ok to keep foley for now. Will remove this afternoon - will continue to monitor JP output - PTOT  Manning Charity PA-C Department of Neurosurgery

## 2022-11-10 NOTE — Plan of Care (Signed)

## 2022-11-10 NOTE — TOC Progression Note (Signed)
Transition of Care [facility]) - Progression Note    Patient Details  Name: Margaret Cuevas MRN: [id number] Date of Birth: [birth year]  Transition of Care [facility]) CM/SW Contact  Marlowe Sax, RN Phone Number: 11/10/2022, 1:47 PM  Clinical Narrative:    Spoke with the patient, she stated that she lives at home with her husband and daughter, she will need a RW and 3 in1, stated the regular size is fine, I reached out to [place] with [employer] and requested the DME to be delivered to the bedside  Her husband or mother will provide transportation No additional needs  Expected Discharge Plan: Home w Home Health Services Barriers to Discharge: Continued Medical Work up  Expected Discharge Plan and Services   Discharge Planning Services: CM Consult   Living arrangements for the past 2 months: Single Family Home                 DME Arranged: Dan Humphreys rolling, 3-N-1 DME Agency: [employer] Date DME Agency Contacted: 11/10/22 Time DME Agency Contacted: [phone] Representative spoke with at DME Agency: Osvaldo Angst Arranged: PT HH Agency: [facility] Date [facility] Agency Contacted: 11/10/22 Time HH Agency Contacted: 1341 Representative spoke with at [facility] Agency: Bjorn Loser   Social Determinants of Health (SDOH) Interventions SDOH Screenings   Food Insecurity: No Food Insecurity (09/22/2022)  Housing: Low Risk  (09/22/2022)  Transportation Needs: No Transportation Needs (09/22/2022)  Utilities: Not At Risk (01/02/2022)  Alcohol Screen: Low Risk  (09/22/2022)  Depression (PHQ2-9): Low Risk  (09/22/2022)  Financial Resource Strain: Low Risk  (09/22/2022)  Physical Activity: Unknown (09/22/2022)  Social Connections: Moderately Integrated (09/22/2022)  Stress: Stress Concern Present (09/22/2022)  Tobacco Use: High Risk (11/02/2022)    Readmission Risk Interventions     No data to display

## 2022-11-10 NOTE — Evaluation (Signed)
Occupational Therapy Evaluation Patient Details Name: Margaret Cuevas MRN: [id number] DOB: 03/20/1976 Today's Date: 11/10/2022   History of Present Illness Patient is a 47 year old female with lumbar radicolopathy, recurrent disc herniation of lumbar disc, spinal stenosis s/p posterolateral fusion L4-5, right L5-S1 far lateral foraminotomy. History of fibromyalgia, anxiety   Clinical Impression   Margaret Cuevas was seen for OT evaluation this date. Prior to hospital admission, pt was MOD I using RW as needed. Pt lives with spouse and teenage children. Premedicated for session, reports 7/10 pain in back and R knee. Pt currently requires MAX A don B socks seated EOB. CGA + RW for ADL t/f, tolerates ~160 ft. Educate don BLT, poor adherence during bed mobility, completes with MIN A for sidelying>sit. Pt would benefit from skilled OT to address noted impairments and functional limitations (see below for any additional details). Upon hospital discharge, recommend assistance for LB dressing/bathing.   Recommendations for follow up therapy are one component of a multi-disciplinary discharge planning process, led by the attending physician.  Recommendations may be updated based on patient status, additional functional criteria and insurance authorization.   Assistance Recommended at Discharge Frequent or constant Supervision/Assistance  Patient can return home with the following A little help with walking and/or transfers;A lot of help with bathing/dressing/bathroom;Help with stairs or ramp for entrance    Functional Status Assessment  Patient has had a recent decline in their functional status and demonstrates the ability to make significant improvements in function in a reasonable and predictable amount of time.  Equipment Recommendations  BSC/3in1;Other (comment) Adult nurse)    Recommendations for Other Services       Precautions / Restrictions Precautions Precautions: Back Restrictions Weight  Bearing Restrictions: No      Mobility Bed Mobility Overal bed mobility: Needs Assistance Bed Mobility: Rolling, Sidelying to Sit, Sit to Supine Rolling: Min guard Sidelying to sit: Min assist   Sit to supine: Mod assist        Transfers Overall transfer level: Needs assistance Equipment used: Rolling walker (2 wheels) Transfers: Sit to/from Stand Sit to Stand: Min guard                  Balance Overall balance assessment: Needs assistance Sitting-balance support: No upper extremity supported, Feet supported Sitting balance-Leahy Scale: Good     Standing balance support: Bilateral upper extremity supported Standing balance-Leahy Scale: Fair                             ADL either performed or assessed with clinical judgement   ADL Overall ADL's : Needs assistance/impaired                                       General ADL Comments: MAX A don B socks seated EOB. CGA + RW for ADL t/f. IND don/doff gown in sitting.      Pertinent Vitals/Pain Pain Assessment Pain Assessment: 0-10 Pain Score: 7  Pain Location: back, R knee Pain Descriptors / Indicators: Grimacing, Discomfort Pain Intervention(s): Limited activity within patient's tolerance, Repositioned, Premedicated before session     Hand Dominance     Extremity/Trunk Assessment Upper Extremity Assessment Upper Extremity Assessment: Defer to OT evaluation   Lower Extremity Assessment Lower Extremity Assessment: Overall WFL for tasks assessed (patient does report R knee pain and posterior R thigh (hamstring area)  tightness)       Communication Communication Communication: No difficulties   Cognition Arousal/Alertness: Awake/alert Behavior During Therapy: WFL for tasks assessed/performed Overall Cognitive Status: Within Functional Limits for tasks assessed                                                  Home Living Family/patient expects to be  discharged to:: Private residence Living Arrangements: Spouse/significant other;Children Available Help at Discharge: Family;Available 24 hours/day Type of Home: House Home Access: Stairs to enter [employer] of Steps: 5 with rails, 2 without rails Entrance Stairs-Rails: Right;Left Home Layout: Able to live on main level with bedroom/bathroom     Bathroom Shower/Tub: Walk-in shower         Home Equipment: None   Additional Comments: adjustable bed      Prior Functioning/Environment Prior Level of Function : Independent/Modified Independent;Working/employed;Driving             Mobility Comments: using a friend's rolling walker recently. ADLs Comments: works in Education officer, environmental. independent ADLs        OT Problem List: Decreased range of motion;Pain      OT Treatment/Interventions: Self-care/ADL training;Therapeutic exercise;Energy conservation;DME and/or AE instruction;Therapeutic activities;Balance training    OT Goals(Current goals can be found in the care plan section) Acute Rehab OT Goals Patient Stated Goal: to go home OT Goal Formulation: With patient Time For Goal Achievement: 11/24/22 Potential to Achieve Goals: Good ADL Goals Pt Will Perform Grooming: Independently;standing Pt Will Perform Lower Body Dressing: with modified independence;with adaptive equipment;sitting/lateral leans;with caregiver independent in assisting Pt Will Transfer to Toilet: ambulating;with modified independence;regular height toilet  OT Frequency: Min 1X/week    Co-evaluation PT/OT/SLP Co-Evaluation/Treatment: Yes Reason for Co-Treatment: To address functional/ADL transfers PT goals addressed during session: Mobility/safety with mobility OT goals addressed during session: ADL's and self-care      AM-PAC OT "6 Clicks" Daily Activity     Outcome Measure Help from another person eating meals?: None Help from another person taking care of personal grooming?: None Help from  another person toileting, which includes using toliet, bedpan, or urinal?: A Little Help from another person bathing (including washing, rinsing, drying)?: A Lot Help from another person to put on and taking off regular upper body clothing?: None Help from another person to put on and taking off regular lower body clothing?: A Lot 6 Click Score: 19   End of Session Nurse Communication: Mobility status  Activity Tolerance: Patient tolerated treatment well Patient left: in bed;with call bell/phone within reach  OT Visit Diagnosis: Unsteadiness on feet (R26.81);Muscle weakness (generalized) (M62.81)                Time: 6578-4696 OT Time Calculation (min): 45 min Charges:  OT General Charges $OT Visit: 1 Visit OT Evaluation $OT Eval Low Complexity: 1 Low OT Treatments $Self Care/Home Management : 23-37 mins  Kathie Dike, M.S. OTR/L  11/10/22, 1:14 PM  ascom [phone]

## 2022-11-10 NOTE — Progress Notes (Signed)
Patient awakened from sleep, screaming in pain. Requested for Chaplain. All pain medicine ordered given and exhausted. Patient asked for her Buspirone and Cymbalta earlier than scheduled as she wants to take them as it helps with her Fibromyalgia. She said her Fibromyalgia is in full blown and she wants to take them. She was crying and screaming for pain but she said its like the surgery triggered her Fibromyalgia. MD made aware. Pt also verbalized that Valium and Dilaudid IV that was given last night made her fall asleep and took a lot of the pain away. MD Ordered and given. Pt also requested not to remove the Foley for now as she is in extreme pain and won't be able to get up to pee. MD aware and acknowledged.

## 2022-11-11 ENCOUNTER — Ambulatory Visit: Payer: No Typology Code available for payment source | Admitting: Family Medicine

## 2022-11-11 MED ORDER — DIAZEPAM 2 MG PO TABS: 2.0000 mg | ORAL_TABLET | Freq: Four times a day (QID) | ORAL | 0 refills | Status: AC | PRN

## 2022-11-11 MED ORDER — CYCLOBENZAPRINE HCL 10 MG PO TABS
10.0000 mg | ORAL_TABLET | Freq: Three times a day (TID) | ORAL | 0 refills | Status: DC | PRN
Start: 2022-11-11 — End: 2022-12-25

## 2022-11-11 MED ORDER — ACETAMINOPHEN 325 MG PO TABS
650.0000 mg | ORAL_TABLET | Freq: Four times a day (QID) | ORAL | Status: DC | PRN
  Administered 2022-11-11: 650 mg via ORAL
  Filled 2022-11-11: qty 2

## 2022-11-11 MED ORDER — SENNA 8.6 MG PO TABS: 1.0000 | ORAL_TABLET | Freq: Two times a day (BID) | ORAL | 0 refills | Status: DC

## 2022-11-11 MED ORDER — OXYCODONE HCL 5 MG PO TABS: 5.0000 mg | ORAL_TABLET | ORAL | 0 refills | Status: AC | PRN

## 2022-11-11 NOTE — Progress Notes (Addendum)
   Neurosurgery Progress Note  History: Margaret Cuevas is s/p L4-5 XLIF and PSF. Right L5-S1 far lateral foraminotomy   POD2: NAEO. Pt reports improved pain control.   POD1: Pt reports severe pain this morning largely in her right back around her waist and hip and into her right groin and anterior thigh.  This was improved some with medication.  Physical Exam: Vitals:   11/11/22 0052 11/11/22 0442  BP:    Pulse:    Resp:    Temp: 99 F (37.2 C) (!) 100.4 F (38 C)  SpO2:      AA Ox3 CNI  Strength: 5/5 throughout BLE    Data:  Other tests/results: none  Assessment/Plan:  Margaret Cuevas  is a 47 year old presenting with lumbar disc herniation and lumbar radiculopathy status post L4-5 XLIF and PSF. Right L5-S1 far lateral foraminotomy with worsening pain on postop day 1.  This has improved some with medication changes.  - mobilize - pain control - DVT prophylaxis - PTOT  Manning Charity PA-C Department of Neurosurgery

## 2022-11-11 NOTE — Progress Notes (Signed)
Physical Therapy Treatment Patient Details Name: Margaret Cuevas MRN: [id number] DOB: June 08, 1975 Today's Date: 11/11/2022   History of Present Illness Patient is a 47 year old female with lumbar radicolopathy, recurrent disc herniation of lumbar disc, spinal stenosis s/p posterolateral fusion L4-5, right L5-S1 far lateral foraminotomy. History of fibromyalgia, anxiety    PT Comments  Pt was long sitting in bed upon arrival. She is A and O x 4. Was premedicated for pain prior to session but continues to endorse 7/10 pain. She demonstrated improved abilities to exit bed, stand, and ambulate without physical assistance. Pt also demonstrated safe abilities to ascend/descend 4 stair with BUE support on +1 R rail. Pt is progressing well. Recommend continued skilled PT at DC to maximize independence and safety with all ADLs while improving pain free functional abilities.      Assistance Recommended at Discharge Set up Supervision/Assistance  If plan is discharge home, recommend the following:  Assist for transportation;Help with stairs or ramp for entrance;Assistance with cooking/housework    Equipment Recommendations  None recommended by PT (Pt has all personal equipment in room already)       Precautions / Restrictions Precautions Precautions: Back Restrictions Weight Bearing Restrictions: No     Mobility  Bed Mobility Overal bed mobility: Needs Assistance Bed Mobility: Supine to Sit, Sit to Supine Rolling: Supervision Sidelying to sit: Supervision Supine to sit: Supervision  General bed mobility comments: No physical assistance required to roll L to short sit. Sat EOB at conclusion of session to eat breakfast.    Transfers Overall transfer level: Needs assistance Equipment used: Rolling walker (2 wheels) Transfers: Sit to/from Stand Sit to Stand: Supervision  General transfer comment: No physical assistance required to stand form slightly elevated bed height. Per pt," my bed and  chair at home are high."    Ambulation/Gait Ambulation/Gait assistance: Supervision Gait Distance (Feet): 200 Feet Assistive device: Rolling walker (2 wheels) Gait Pattern/deviations: Step-through pattern Gait velocity: decreased  General Gait Details: no LOB or knee buckling noted. safely ambulate ~ 200 ft total with use of RW. pt has personal RW and BSC aklready delivered to room.   Stairs Stairs: Yes Stairs assistance: Supervision Stair Management: Step to pattern, Sideways, One rail Right Number of Stairs: 4 General stair comments: pt demonstrated safe abilities to ascend/descend 4 stair with BUE on +1 r rail (ascending). no LOB or safety cobncerns with stair performance.   Balance Overall balance assessment: Needs assistance Sitting-balance support: Feet supported Sitting balance-Leahy Scale: Good     Standing balance support: Bilateral upper extremity supported Standing balance-Leahy Scale: Fair       Cognition Arousal/Alertness: Awake/alert Behavior During Therapy: WFL for tasks assessed/performed Overall Cognitive Status: Within Functional Limits for tasks assessed      General Comments: Pt si A and O x 4. Agrees to session but had to be pre-medicated               Pertinent Vitals/Pain Pain Assessment Pain Assessment: 0-10 Pain Score: 7  Pain Location: back, R LE Pain Descriptors / Indicators: Grimacing, Discomfort Pain Intervention(s): Limited activity within patient's tolerance, Monitored during session, Repositioned, Premedicated before session     PT Goals (current goals can now be found in the care plan section) Acute Rehab PT Goals Patient Stated Goal: go home when pain is better Progress towards PT goals: Progressing toward goals    Frequency    7X/week      PT Plan Current plan remains appropriate  Co-evaluation     PT goals addressed during session: Mobility/safety with mobility;Balance;Proper use of DME;Strengthening/ROM         AM-PAC PT "6 Clicks" Mobility   Outcome Measure  Help needed turning from your back to your side while in a flat bed without using bedrails?: A Little Help needed moving from lying on your back to sitting on the side of a flat bed without using bedrails?: A Little Help needed moving to and from a bed to a chair (including a wheelchair)?: A Little Help needed standing up from a chair using your arms (e.g., wheelchair or bedside chair)?: A Little Help needed to walk in hospital room?: A Little Help needed climbing 3-5 steps with a railing? : A Little 6 Click Score: 18    End of Session   Activity Tolerance: Patient tolerated treatment well;Patient limited by pain Patient left: in bed;with call bell/phone within reach Nurse Communication: Mobility status PT Visit Diagnosis: Muscle weakness (generalized) (M62.81);Pain Pain - Right/Left: Right Pain - part of body: Leg     Time: 4010-2725 PT Time Calculation (min) (ACUTE ONLY): 20 min  Charges:    $Gait Training: 8-22 mins PT General Charges $$ ACUTE PT VISIT: 1 Visit                     Jetta Lout PTA 11/11/22, 2:49 PM

## 2022-11-11 NOTE — Discharge Instructions (Signed)
  Your surgeon has performed an operation on your lumbar spine (low back) to relieve pressure on one or more nerves. Many times, patients feel better immediately after surgery and can "overdo it." Even if you feel well, it is important that you follow these activity guidelines. If you do not let your back heal properly from the surgery, you can increase the chance of hardware complications and/or return of your symptoms. The following are instructions to help in your recovery once you have been discharged from the hospital.  Do not use NSAIDs for 3 months after surgery.   Activity    No bending, lifting, or twisting ("BLT"). Avoid lifting objects heavier than 10 pounds (gallon milk jug).  Where possible, avoid household activities that involve lifting, bending, pushing, or pulling such as laundry, vacuuming, grocery shopping, and childcare. Try to arrange for help from friends and family for these activities while your back heals.  Increase physical activity slowly as tolerated.  Taking short walks is encouraged, but avoid strenuous exercise. Do not jog, run, bicycle, lift weights, or participate in any other exercises unless specifically allowed by your doctor. Avoid prolonged sitting, including car rides.  Talk to your doctor before resuming sexual activity.  You should not drive until cleared by your doctor.  Until released by your doctor, you should not return to work or school.  You should rest at home and let your body heal.   You may shower three days after your surgery.  After showering, lightly dab your incision dry. Do not take a tub bath or go swimming for 3 weeks, or until approved by your doctor at your follow-up appointment.  If you smoke, we strongly recommend that you quit.  Smoking has been proven to interfere with normal healing in your back and will dramatically reduce the success rate of your surgery. Please contact [facility] (800-QUIT-NOW) and use the resources at  www.[facility].com for assistance in stopping smoking.  Surgical Incision   If you have a dressing on your incision, you may remove it three days after your surgery. Keep your incision area clean and dry.  Your incision was closed with Dermabond glue. The glue should begin to peel away within about a week.  Diet            You may return to your usual diet. Be sure to stay hydrated.  When to Contact Us  Although your surgery and recovery will likely be uneventful, you may have some residual numbness, aches, and pains in your back and/or legs. This is normal and should improve in the next few weeks.  However, should you experience any of the following, contact us immediately: New numbness or weakness Pain that is progressively getting worse, and is not relieved by your pain medications or rest Bleeding, redness, swelling, pain, or drainage from surgical incision Chills or flu-like symptoms Fever greater than 101.0 F (38.3 C) Problems with bowel or bladder functions Difficulty breathing or shortness of breath Warmth, tenderness, or swelling in your calf  Contact Information During office hours (Monday-Friday 9 am to 5 pm), please call your physician at [phone] and ask for Kendelyn Jean After hours and weekends, please call [phone] and speak with the neurosurgeon on call For a life-threatening emergency, call 911 

## 2022-11-11 NOTE — Discharge Summary (Signed)
Discharge Summary  Patient ID: Margaret Cuevas MRN: [id number] DOB/AGE: Apr 09, 1976 47 y.o.  Admit date: 11/09/2022 Discharge date: 11/11/2022  Admission Diagnoses:  Lumbar radiculopathy M54.16 , Recurrent herniation of lumbar disc M51.26 , Spinal stenosis of lumbar region without neurogenic claudication M48.061   Discharge Diagnoses:  Principal Problem:   S/P lumbar fusion Active Problems:   Recurrent herniation of lumbar disc   Spinal stenosis of lumbar region without neurogenic claudication   Discharged Condition: fair  Hospital Course:  Margaret Cuevas is a 47 y.o presenting with recurrent disc herniation and spinal stenosis status post L4-5 XLIF and posterior spinal fusion, right L4-5 far lateral foraminotomy.  Her intraoperative course was uncomplicated.  She was admitted for drain output monitoring, pain control, and therapy evaluation.  Her drain was removed on postop day 1.  Throughout her hospitalization she struggled with right groin. This was controlled with pain medications.  She was seen by therapy and deemed appropriate for discharge home on postop day 2.  She was discharged with prescriptions for oxycodone, Flexeril, senna, and Valium.  Consults: None  Significant Diagnostic Studies: none  Treatments: surgery: As above.  Please see separately dictated operative report for further details.  Discharge Exam: Blood pressure (!) 134/99, pulse 100, temperature 98.7 F (37.1 C), resp. rate 14, height 5\' 6"  (1.676 m), weight 116.1 kg, SpO2 98%.   AA Ox3 CNI   Strength: 5/5 throughout BLE Incisions clean dry and intact  Disposition: Discharge disposition: 01-Home or Self Care       Discharge Instructions     Incentive spirometry RT   Complete by: As directed       Allergies as of 11/11/2022       Reactions   Ibuprofen Other (See Comments)   Motrin makes her throat swell. Can take ibuprofen        Medication List     TAKE these medications     acetaminophen 650 MG CR tablet Commonly known as: TYLENOL Take 1,300 mg by mouth every 8 (eight) hours as needed for pain.   busPIRone 7.5 MG tablet Commonly known as: BUSPAR TAKE 1 TABLET(7.5 MG) BY MOUTH TWICE DAILY   celecoxib 100 MG capsule Commonly known as: CELEBREX Take 1 capsule by mouth 2 (two) times daily. Rheum   cyclobenzaprine 10 MG tablet Commonly known as: FLEXERIL Take 1 tablet (10 mg total) by mouth 3 (three) times daily as needed for muscle spasms. What changed: See the new instructions.   diazepam 2 MG tablet Commonly known as: Valium Take 1 tablet (2 mg total) by mouth every 6 (six) hours as needed for up to 3 days for muscle spasms.   DULoxetine 60 MG capsule Commonly known as: CYMBALTA Take 60 mg by mouth every morning. And then an additional if needed   estradiol 0.1 MG/24HR patch Commonly known as: VIVELLE-DOT Place 1 patch (0.1 mg total) onto the skin 2 (two) times a week. Dr Schermerhorn   gabapentin 300 MG capsule Commonly known as: Neurontin Take 3 capsules (900 mg total) by mouth 3 (three) times daily.   oxyCODONE 5 MG immediate release tablet Commonly known as: Oxy IR/ROXICODONE Take 1-2 tablets (5-10 mg total) by mouth every 3 (three) hours as needed for up to 5 days for moderate pain or severe pain ((score 4 to 6)). What changed:  how much to take when to take this reasons to take this   senna 8.6 MG Tabs tablet Commonly known as: SENOKOT Take 1 tablet (8.6 mg total)  by mouth 2 (two) times daily.               Durable Medical Equipment  (From admission, onward)           Start     Ordered   11/10/22 1342  For home use only DME Walker rolling  Once       Question Answer Comment  Walker: With 5 Inch Wheels   Patient needs a walker to treat with the following condition Impaired mobility      11/10/22 1342   11/10/22 1342  For home use only DME Bedside commode  Once       Question:  Patient needs a bedside commode to  treat with the following condition  Answer:  Impaired mobility   11/10/22 1342             Signed: Susanne Borders 11/11/2022, 2:50 PM

## 2022-11-11 NOTE — Plan of Care (Signed)
   Problem: Activity: Goal: Ability to avoid complications of mobility impairment will improve Outcome: Progressing Goal: Ability to tolerate increased activity will improve Outcome: Progressing Goal: Will remain free from falls Outcome: Progressing

## 2022-11-12 ENCOUNTER — Telehealth: Payer: Self-pay | Admitting: *Deleted

## 2022-11-12 NOTE — Transitions of Care (Post Inpatient/ED Visit) (Signed)
   11/12/2022  Name: Margaret Cuevas MRN: [id number] DOB: Jan 07, 1976  Today's TOC FU Call Status: Today's TOC FU Call Status:: Unsuccessul Call (1st Attempt) Unsuccessful Call (1st Attempt) Date: 11/12/22  Attempted to reach the patient regarding the most recent Inpatient/ED visit.  Follow Up Plan: Additional outreach attempts will be made to reach the patient to complete the Transitions of Care (Post Inpatient/ED visit) call.   Gean Maidens BSN RN [facility] [phone]

## 2022-11-16 ENCOUNTER — Telehealth: Payer: Self-pay | Admitting: *Deleted

## 2022-11-16 ENCOUNTER — Encounter: Payer: Self-pay | Admitting: Neurosurgery

## 2022-11-16 NOTE — Transitions of Care (Post Inpatient/ED Visit) (Signed)
11/16/2022  Name: Margaret Cuevas MRN: [id number] DOB: [birth year]  Today's TOC FU Call Status: Today's TOC FU Call Status:: Successful TOC FU Call Competed TOC FU Call Complete Date: 11/16/22  Transition Care Management Follow-up Telephone Call Date of Discharge: 11/11/22 Discharge Facility: [facility] Type of Discharge: Inpatient Admission Primary Inpatient Discharge Diagnosis:: lumbar fusion How have you been since you were released from the hospital?: Better (but still having a lot of pain. Patient contacted the Dr today and Gabapentin dosage was increased.) Any questions or concerns?: No  Items Reviewed: Did you receive and understand the discharge instructions provided?: Yes Medications obtained,verified, and reconciled?: Yes (Medications Reviewed) Any new allergies since your discharge?: No Dietary orders reviewed?: No Do you have support at home?: Yes People in Home: spouse Name of Support/Comfort Primary Source: [place]  Medications Reviewed Today: Medications Reviewed Today     Reviewed by Luella Cook, RN (Case Manager) on 11/16/22 at 1641  Med List Status: <None>   Medication Order Taking? Sig Documenting Provider Last Dose Status Informant  acetaminophen (TYLENOL) 650 MG CR tablet [id number] Yes Take 1,300 mg by mouth every 8 (eight) hours as needed for pain. [provider] Taking Active   busPIRone (BUSPAR) 7.5 MG tablet [id number] Yes TAKE 1 TABLET(7.5 MG) BY MOUTH TWICE DAILY [name], MD Taking Active   celecoxib (CELEBREX) 100 MG capsule [id number] Yes Take 1 capsule by mouth 2 (two) times daily. Rheum [provider] Taking Active   cyclobenzaprine (FLEXERIL) 10 MG tablet [id number] Yes Take 1 tablet (10 mg total) by mouth 3 (three) times daily as needed for muscle spasms. Susanne Borders, PA Taking Active   DULoxetine (CYMBALTA) 60 MG capsule [id number] Yes Take 60 mg by mouth every morning. And then an  additional if needed [provider] Taking Active   estradiol (VIVELLE-DOT) 0.1 MG/24HR patch [id number] Yes Place 1 patch (0.1 mg total) onto the skin 2 (two) times a week. Dr Rebekah Chesterfield, MD Taking Active   gabapentin (NEURONTIN) 300 MG capsule [id number] Yes Take 3 capsules (900 mg total) by mouth 3 (three) times daily. Venetia Night, MD Taking Active   oxyCODONE (OXY IR/ROXICODONE) 5 MG immediate release tablet [id number] Yes Take 1-2 tablets (5-10 mg total) by mouth every 3 (three) hours as needed for up to 5 days for moderate pain or severe pain ((score 4 to 6)). Susanne Borders, PA Taking Active   senna (SENOKOT) 8.6 MG TABS tablet [id number] Yes Take 1 tablet (8.6 mg total) by mouth 2 (two) times daily. Susanne Borders, PA Taking Active             Home Care and Equipment/Supplies: Were Home Health Services Ordered?: Yes Name of Home Health Agency:: [facility] Has Agency set up a time to come to your home?: No EMR reviewed for Home Health Orders: Orders present/patient has not received call (refer to CM for follow-up) (RN gave patient the number to call to see when they are coming out) Any new equipment or medical supplies ordered?: Yes Name of Medical supply agency?: [facility] Were you able to get the equipment/medical supplies?: Yes Do you have any questions related to the use of the equipment/supplies?: No (RN explained the [facility] can be placed over toliet with potty removed for elevation)  Functional Questionnaire: Do you need assistance with bathing/showering or dressing?: Yes Do you need assistance with meal preparation?: Yes Do you need assistance with eating?: No Do you have  difficulty maintaining continence: No Do you need assistance with getting out of bed/getting out of a chair/moving?: No Do you have difficulty managing or taking your medications?: No  Follow up appointments reviewed: PCP Follow-up appointment confirmed?: NA Specialist  Hospital Follow-up appointment confirmed?: Yes Date of Specialist follow-up appointment?: 11/25/22 Follow-Up Specialty Provider:: Lonn [place] [id number], Dr Marcell Barlow [phone] Do you need transportation to your follow-up appointment?: No Do you understand care options if your condition(s) worsen?: Yes-patient verbalized understanding  SDOH Interventions Today    Flowsheet Row Most Recent Value  SDOH Interventions   Food Insecurity Interventions Intervention Not Indicated  Housing Interventions Intervention Not Indicated  Transportation Interventions Intervention Not Indicated, Patient Resources (Friends/Family)      Interventions Today    Flowsheet Row Most Recent Value  General Interventions   General Interventions Discussed/Reviewed General Interventions Discussed, General Interventions Reviewed  Pharmacy Interventions   Pharmacy Dicussed/Reviewed Pharmacy Topics Discussed      TOC Interventions Today    Flowsheet Row Most Recent Value  TOC Interventions   TOC Interventions Discussed/Reviewed TOC Interventions Discussed, TOC Interventions Reviewed, Contacted Home Health RN/OT/PT  [ways to utilize bsc. RN gave patient number to contact [facility] agency since she had not heard from them.]        Gean Maidens BSN RN [facility] [phone]

## 2022-11-20 ENCOUNTER — Other Ambulatory Visit: Payer: Self-pay

## 2022-11-20 ENCOUNTER — Encounter: Payer: Self-pay | Admitting: Family Medicine

## 2022-11-20 DIAGNOSIS — F321 Major depressive disorder, single episode, moderate: Secondary | ICD-10-CM

## 2022-11-20 DIAGNOSIS — F419 Anxiety disorder, unspecified: Secondary | ICD-10-CM

## 2022-11-20 NOTE — Progress Notes (Unsigned)
   REFERRING PHYSICIAN:  [name], Md [address] [place],  [place] [address]  DOS: 11/09/22 XLIF L4-5 XLIF and posterior spinal fusion, right L5-S1 far lateral foraminotomy   Surgery: 08/07/2022 (R L4/5 far lateral discectomy)    HISTORY OF PRESENT ILLNESS: Margaret Cuevas is approximately 2 weeks status post L4-5 XLIF and posterior spinal fusion, right L5-S1 far lateral foraminotomy . Was given oxycodone, flexeril, and valium on discharge from the hospital.   She has mild LBP and right posterior leg pain to her foot. She notes numbness in right leg below the knee that is anterior. No significant left leg pain. She has good days and bad days.   She has started HHPT and OT.   Insurance would not approve neurontin 1200mg  tid, they did approve 900mg  tid. She is taking celebrex, flexeril, and prn oxycodone. No refills needed.   PHYSICAL EXAMINATION:  General: Patient is well developed, well nourished, calm, collected, and in no apparent distress.   NEUROLOGICAL:  General: In no acute distress.   Awake, alert, oriented to person, place, and time.  Pupils equal round and reactive to light.  Facial tone is symmetric.     Strength:            Side Iliopsoas Quads Hamstring PF DF EHL  R 4+ 5 5 5 5 5   L 5 5 5 5 5 5    Incisions c/d/I  Lateral incision looks better with only minimal erythema.    ROS (Neurologic):  Negative except as noted above  IMAGING: Nothing new to review.   ASSESSMENT/PLAN:  Margaret Cuevas is doing well s/p above surgery. Treatment options reviewed with patient and following plan made:   - I have advised the patient to lift up to 10 pounds until 6 weeks after surgery (follow up with Dr. Myer Haff).  - Reviewed wound care.  - No bending, twisting, or lifting.  - Finish out the keflex.  - Discussed some of her right leg pain and slight weakness with iliopsoas is from exposure and should improve in next few weeks.  - Continue on current  medications including prn oxycodone, flexeril, and celebrex.  - Will do neurontin 900mg  tid for now as insurance did not approve 1200mg  per patient. If this does not help, she will let me know and we can work on getting the 1200mg  tid approved.  - Follow up as scheduled in 4 weeks and prn. Will need xrays at that time.   Advised to contact the office if any questions or concerns arise.  Drake Leach PA-C Department of neurosurgery

## 2022-11-23 ENCOUNTER — Telehealth: Payer: Self-pay | Admitting: Neurosurgery

## 2022-11-23 ENCOUNTER — Encounter: Payer: Self-pay | Admitting: Neurosurgery

## 2022-11-23 MED ORDER — OXYCODONE HCL 5 MG PO TABS: 5.0000 mg | ORAL_TABLET | ORAL | 0 refills | Status: DC | PRN

## 2022-11-23 MED ORDER — GABAPENTIN 300 MG PO CAPS: 1200.0000 mg | ORAL_CAPSULE | Freq: Three times a day (TID) | ORAL | 1 refills | Status: DC

## 2022-11-23 NOTE — Telephone Encounter (Signed)
DOS: 11/09/22  L4-5 XLIF and posterior spinal fusion, right L5-S1 far lateral foraminotomy  She is now taking neurontin 1200mg  three times a day. Refill sent to pharmacy.   Refill of oxycodone sent as well. PMP reviewed and is appropriate.   Message sent to Dr. Myer Haff regarding her incision- will let her know when I hear back from him.

## 2022-11-23 NOTE — Telephone Encounter (Signed)
Ashley has been notified

## 2022-11-23 NOTE — Telephone Encounter (Signed)
Media Information                      Document Information

## 2022-11-24 ENCOUNTER — Encounter: Payer: Self-pay | Admitting: Orthopedic Surgery

## 2022-11-24 DIAGNOSIS — Z981 Arthrodesis status: Secondary | ICD-10-CM

## 2022-11-24 DIAGNOSIS — M5126 Other intervertebral disc displacement, lumbar region: Secondary | ICD-10-CM

## 2022-11-24 MED ORDER — CEPHALEXIN 500 MG PO CAPS
500.0000 mg | ORAL_CAPSULE | Freq: Four times a day (QID) | ORAL | 0 refills | Status: AC
Start: 2022-11-24 — End: 2022-12-01

## 2022-11-25 ENCOUNTER — Ambulatory Visit (INDEPENDENT_AMBULATORY_CARE_PROVIDER_SITE_OTHER): Payer: No Typology Code available for payment source | Admitting: Orthopedic Surgery

## 2022-11-25 ENCOUNTER — Encounter: Payer: Self-pay | Admitting: Orthopedic Surgery

## 2022-11-25 VITALS — BP 124/86 | Ht 66.0 in | Wt 262.0 lb

## 2022-11-25 DIAGNOSIS — M48061 Spinal stenosis, lumbar region without neurogenic claudication: Secondary | ICD-10-CM

## 2022-11-25 DIAGNOSIS — M5126 Other intervertebral disc displacement, lumbar region: Secondary | ICD-10-CM

## 2022-11-25 DIAGNOSIS — Z09 Encounter for follow-up examination after completed treatment for conditions other than malignant neoplasm: Secondary | ICD-10-CM

## 2022-11-25 DIAGNOSIS — Z981 Arthrodesis status: Secondary | ICD-10-CM

## 2022-11-25 DIAGNOSIS — M5416 Radiculopathy, lumbar region: Secondary | ICD-10-CM

## 2022-12-01 ENCOUNTER — Telehealth: Payer: Self-pay | Admitting: Neurosurgery

## 2022-12-01 ENCOUNTER — Encounter: Payer: Self-pay | Admitting: Neurosurgery

## 2022-12-01 NOTE — Telephone Encounter (Signed)
Morrie Sheldon with [facility] called to report:  Pain level 7/10 pain in spine Very emotional today and may be over doing activity  Didn't do a lot of exercises today focused more on pain management regimens as again Morrie Sheldon thinks pt is over doing it between therapy sessions. She seems frustrated that she can't do more and seems emotional over this. May be pushing herself to hard and causing more pain.
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
## 2022-12-01 NOTE — Telephone Encounter (Signed)
Noted. Discussed with Margaret Cuevas. Pt was seen last week and medication changes have been made. She has a follow up appointment with Dr Myer Haff this month.

## 2022-12-03 ENCOUNTER — Encounter: Payer: Self-pay | Admitting: Psychiatry

## 2022-12-03 ENCOUNTER — Telehealth: Payer: No Typology Code available for payment source | Admitting: Psychiatry

## 2022-12-03 DIAGNOSIS — F419 Anxiety disorder, unspecified: Secondary | ICD-10-CM

## 2022-12-03 DIAGNOSIS — F321 Major depressive disorder, single episode, moderate: Secondary | ICD-10-CM | POA: Diagnosis not present

## 2022-12-03 MED ORDER — DULOXETINE HCL 30 MG PO CPEP: 30.0000 mg | ORAL_CAPSULE | Freq: Every day | ORAL | 1 refills | Status: DC

## 2022-12-03 NOTE — Patient Instructions (Signed)
Increase duloxetine 90 mg daily Continue BuSpar 7.5 mg twice a day Referral for CBT Next appointment 9/30 at 11 am

## 2022-12-04 ENCOUNTER — Telehealth: Payer: Self-pay | Admitting: Neurosurgery

## 2022-12-04 NOTE — Telephone Encounter (Signed)
Margaret Cuevas from [facility] called asking for verbal orders to extend [facility] PT 2w x2 1w x1  CB:[phone]

## 2022-12-04 NOTE — Telephone Encounter (Signed)
Spoke with Irving Burton gave verbal orders to extend HT 2w x2, 1w x1

## 2022-12-09 DIAGNOSIS — K316 Fistula of stomach and duodenum: Secondary | ICD-10-CM | POA: Diagnosis not present

## 2022-12-09 DIAGNOSIS — Z713 Dietary counseling and surveillance: Secondary | ICD-10-CM | POA: Diagnosis not present

## 2022-12-09 DIAGNOSIS — K912 Postsurgical malabsorption, not elsewhere classified: Secondary | ICD-10-CM | POA: Diagnosis not present

## 2022-12-09 DIAGNOSIS — Z9884 Bariatric surgery status: Secondary | ICD-10-CM | POA: Diagnosis not present

## 2022-12-10 ENCOUNTER — Telehealth: Payer: Self-pay | Admitting: Neurosurgery

## 2022-12-10 NOTE — Telephone Encounter (Signed)
I called the number below and it gives me a busy signal every time I call it. I looked back in previous telephone encounters and found the number to [facility]. It is [phone]

## 2022-12-10 NOTE — Telephone Encounter (Signed)
L4-5 XLIF/PSF, Right L5-S1 far lateral foraminotomy on 11/09/22  Margaret Cuevas is calling to report pt's pain is at 7. The last few visits it was higher.  Margaret Cuevas is asking if Dr.Yarbrough wants to up date the parameter for pain. [phone]

## 2022-12-10 NOTE — Telephone Encounter (Signed)
CB: [phone]

## 2022-12-10 NOTE — Telephone Encounter (Signed)
I spoke with Margaret Cuevas and gave the verbal order for OT.

## 2022-12-10 NOTE — Telephone Encounter (Signed)
Morrie Sheldon with [facility] calling for verbal orders for OT changing the frequency 1w x 3

## 2022-12-11 NOTE — Telephone Encounter (Signed)
Thoughts?

## 2022-12-14 ENCOUNTER — Encounter: Payer: Self-pay | Admitting: Neurosurgery

## 2022-12-14 ENCOUNTER — Other Ambulatory Visit: Payer: Self-pay | Admitting: Orthopedic Surgery

## 2022-12-14 DIAGNOSIS — Z981 Arthrodesis status: Secondary | ICD-10-CM

## 2022-12-14 MED ORDER — OXYCODONE HCL 5 MG PO TABS
5.0000 mg | ORAL_TABLET | Freq: Four times a day (QID) | ORAL | 0 refills | Status: DC | PRN
Start: 2022-12-14 — End: 2022-12-29

## 2022-12-14 NOTE — Telephone Encounter (Signed)
DOS: 11/09/22 XLIF L4-5 XLIF and posterior spinal fusion, right L5-S1 far lateral foraminotomy    Surgery: 08/07/2022 (R L4/5 far lateral discectomy)   PMP reviewed and is appropriate.   Will send oxycodone to pharmacy with directions of 1 every 6 hours as needed for severe pain.   Please let her know.

## 2022-12-15 ENCOUNTER — Other Ambulatory Visit: Payer: Self-pay

## 2022-12-15 ENCOUNTER — Telehealth: Payer: Self-pay

## 2022-12-15 ENCOUNTER — Inpatient Hospital Stay
Admission: RE | Admit: 2022-12-15 | Discharge: 2022-12-15 | Disposition: A | Discharge status: Home / Self Care | Payer: Self-pay | Source: Ambulatory Visit | Attending: Neurosurgery | Admitting: Neurosurgery

## 2022-12-15 DIAGNOSIS — Z049 Encounter for examination and observation for unspecified reason: Secondary | ICD-10-CM

## 2022-12-15 MED ORDER — GABAPENTIN 600 MG PO TABS: 1200.0000 mg | ORAL_TABLET | Freq: Three times a day (TID) | ORAL | 1 refills | Status: DC

## 2022-12-15 NOTE — Telephone Encounter (Signed)
I spoke with Margaret Cuevas at [facility]. Her currently reporting parameter is 6, so anything higher than a 6, they have to report to [place]. Per Margaret Cuevas, we could change it to say don't call unless it's higher than an 8 (b/c she is usually about an 8) or if something is different/changed that they feel we have to know.  I told her that we would check with you and get back to her

## 2022-12-15 NOTE — Telephone Encounter (Signed)
Left message for Margaret Cuevas w/ [id number] that it is OK to change the parameters per Duwayne Heck.

## 2022-12-15 NOTE — Telephone Encounter (Signed)
Spoke with [employer] FEP [phone]) for gabapentin prior auth.  They will set an override and try again in 24-48 hours If any issues, call [phone], clinical services department.  I notified the patient via mychart.

## 2022-12-16 ENCOUNTER — Ambulatory Visit: Payer: No Typology Code available for payment source | Admitting: Psychology

## 2022-12-16 DIAGNOSIS — F4323 Adjustment disorder with mixed anxiety and depressed mood: Secondary | ICD-10-CM | POA: Diagnosis not present
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
## 2022-12-17 DIAGNOSIS — K316 Fistula of stomach and duodenum: Secondary | ICD-10-CM | POA: Diagnosis not present

## 2022-12-18 ENCOUNTER — Other Ambulatory Visit: Payer: Self-pay

## 2022-12-18 DIAGNOSIS — M5416 Radiculopathy, lumbar region: Secondary | ICD-10-CM

## 2022-12-22 ENCOUNTER — Ambulatory Visit
Admission: RE | Admit: 2022-12-22 | Discharge: 2022-12-22 | Disposition: A | Discharge status: Home / Self Care | Payer: No Typology Code available for payment source | Attending: Neurosurgery | Admitting: Neurosurgery

## 2022-12-22 ENCOUNTER — Ambulatory Visit
Admission: RE | Admit: 2022-12-22 | Discharge: 2022-12-22 | Disposition: A | Discharge status: Home / Self Care | Payer: No Typology Code available for payment source | Source: Ambulatory Visit | Attending: Neurosurgery | Admitting: Neurosurgery

## 2022-12-22 ENCOUNTER — Encounter: Payer: Self-pay | Admitting: Neurosurgery

## 2022-12-22 ENCOUNTER — Ambulatory Visit (INDEPENDENT_AMBULATORY_CARE_PROVIDER_SITE_OTHER): Payer: No Typology Code available for payment source | Admitting: Neurosurgery

## 2022-12-22 VITALS — BP 128/88 | Temp 97.8°F | Ht 66.0 in | Wt 256.0 lb

## 2022-12-22 DIAGNOSIS — M5416 Radiculopathy, lumbar region: Secondary | ICD-10-CM

## 2022-12-22 DIAGNOSIS — Z09 Encounter for follow-up examination after completed treatment for conditions other than malignant neoplasm: Secondary | ICD-10-CM

## 2022-12-22 NOTE — Progress Notes (Signed)
   REFERRING PHYSICIAN:  [name], Md [address] [place],  [place] [address]  DOS: 11/09/22 XLIF L4-5 XLIF and posterior spinal fusion, right L5-S1 far lateral foraminotomy   Surgery: 08/07/2022 (R L4/5 far lateral discectomy)    HISTORY OF PRESENT ILLNESS: Margaret Cuevas is status post L4-5 XLIF and posterior spinal fusion, right L5-S1 far lateral foraminotomy.  Unfortunately, she is not doing very well.  She has continued pain down her right leg.  She is slightly better than preop, but has not made much progress.  She is having some swelling around the right sided incision has some tenderness in this area.  She has significant discomfort with sitting for any length of time.  She has been doing extensive physical and Occupational Therapy.  PHYSICAL EXAMINATION:  General: Patient is well developed, well nourished, calm, collected, and in no apparent distress.   NEUROLOGICAL:  General: In no acute distress.   Awake, alert, oriented to person, place, and time.  Pupils equal round and reactive to light.  Facial tone is symmetric.     Strength:            Side Iliopsoas Quads Hamstring PF DF EHL  R 4+ 5 5 5 5 5   L 5 5 5 5 5 5    Incisions c/d/I  Lateral incision looks better with only minimal erythema.    ROS (Neurologic):  Negative except as noted above  IMAGING: No complications noted  ASSESSMENT/PLAN:  Margaret Cuevas is doing fair s/p above surgery.  I was hoping she would be making more improvements at this point.  She will continue with physical occupational therapy.  We will extend her disability pass for parking.  Her occupational therapist has suggested an assistance belt.  She is getting this more information on this, but I will be happy to prescribe this if able.  If she has not improved at her next appointment, I would recommend that we proceed with CT myelogram of the lumbar spine, MRI scan of the hip and sacrum, and nerve conduction study of the  right lower extremity.  I think she may ultimately be a candidate for spinal cord stimulation given how much pain she does suffer from.     Venetia Night Department of neurosurgery

## 2022-12-23 ENCOUNTER — Ambulatory Visit: Payer: No Typology Code available for payment source | Admitting: Psychology

## 2022-12-23 ENCOUNTER — Encounter: Payer: Self-pay | Admitting: Neurosurgery

## 2022-12-23 DIAGNOSIS — F4323 Adjustment disorder with mixed anxiety and depressed mood: Secondary | ICD-10-CM

## 2022-12-23 NOTE — Progress Notes (Signed)
[facility] Counselor/Therapist Progress Note  Patient ID: Margaret Cuevas, MRN: [id number],    Date: 12/23/2022  Time Spent: 45 mins start time: 1300    end time: 1345  Treatment Type: Individual Therapy  Reported Symptoms: Pt presents for session, via Caregility video, granting consent for the session.  Pt states she is in her home with no one else present; she also shares she understands the limits of virtual sessions.  I shared with pt that I am in my office with no one else here.  Mental Status Exam: Appearance:  Casual     Behavior: Appropriate  Motor: Normal  Speech/Language:  Clear and Coherent  Affect: Appropriate  Mood: normal  Thought process: normal  Thought content:   WNL  Sensory/Perceptual disturbances:   WNL  Orientation: oriented to person, place, and time/date  Attention: Good  Concentration: Good  Memory: WNL  Fund of knowledge:  Good  Insight:   Good  Judgment:  Good  Impulse Control: Good   Risk Assessment: Danger to Self:  No Self-injurious Behavior: No Danger to Others: No Duty to Warn:no Physical Aggression / Violence:No  Access to Firearms a concern: No  Gang Involvement:No   Subjective: Pt shares that she "has been OK since our last session.  They last two days have been tough days for me physically.  My sister came to visit this past weekend and that was great to see her.  I walked more over the weekend than I had been walking and then I had to cancel my physical therapy appt on Monday.  I did see my neurosurgeon yesterday."  Pt shares that neurosurgeon "was disappointed in the fact that I am not yet fully healed from my last surgery.  I go back to see him in 30 days.  He suggested I might need a spinal cord stimulator if the pain continues."  Pt is continuing to work with PT for recovery as well.  Pt is also dealing with fibromyalgia which might be complicating her recovery.  Pt shares that her husband is very supportive of her desire to  get better.  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  She will be talking with the gastric bypass surgery to discuss the possible surgery revision.  Talked with pt more about self care activities and their importance for our recovery.  Pt shares that walking is her favorite and most effective self care activities.  Pt shares that she cannot go to the nail salon to get her nails done and she misses that activity.  She went to [employer] with her friend who took her to get her x-rays and her doctor visit yesterday.    Interventions: Cognitive Behavioral Therapy  Diagnosis:Adjustment disorder with mixed anxiety and depressed mood  Plan: Treatment Plan Strengths/Abilities:  Intelligent, Intuitive, Willing to participate in therapy Treatment Preferences:  Outpatient Individual Therapy Statement of Needs:  Patient is to use CBT, mindfulness and coping skills to help manage and/or decrease symptoms associated with their diagnosis. Symptoms:  Depressed/Irritable mood, worry, social withdrawal Problems Addressed:  Depressive thoughts, Sadness, Sleep issues, etc. Long Term Goals:  Pt to reduce overall level, frequency, and intensity of the feelings of depression/anxiety as evidenced by decreased irritability, negative self talk, and helpless feelings from 6 to 7 days/week to 0 to 1  days/week, per client report, for at least 3 consecutive months.  Progress:  Short Term Goals:  Pt to verbally express understanding of the relationship between feelings of depression/anxiety and their impact on thinking patterns and behaviors.  Pt to verbalize an understanding of the role that distorted thinking plays in creating fears, excessive worry, and ruminations.  Progress:  Target Date:  12/23/2023 Frequency:  Bi-weekly Modality:  Cognitive  Behavioral Therapy Interventions by Therapist:  Therapist will use CBT, Mindfulness exercises, Coping skills and Referrals, as needed by client. Client has verbally approved this treatment plan.  Karie Kirks, [facility]

## 2022-12-24 ENCOUNTER — Other Ambulatory Visit: Payer: Self-pay | Admitting: Neurosurgery

## 2022-12-24 DIAGNOSIS — M62830 Muscle spasm of back: Secondary | ICD-10-CM

## 2022-12-29 ENCOUNTER — Other Ambulatory Visit: Payer: Self-pay | Admitting: Neurosurgery

## 2022-12-29 ENCOUNTER — Other Ambulatory Visit: Payer: Self-pay | Admitting: Orthopedic Surgery

## 2022-12-29 DIAGNOSIS — Z981 Arthrodesis status: Secondary | ICD-10-CM

## 2022-12-29 DIAGNOSIS — Z980 Intestinal bypass and anastomosis status: Secondary | ICD-10-CM | POA: Diagnosis not present

## 2022-12-29 DIAGNOSIS — Z9884 Bariatric surgery status: Secondary | ICD-10-CM | POA: Diagnosis not present

## 2022-12-29 DIAGNOSIS — T183XXA Foreign body in small intestine, initial encounter: Secondary | ICD-10-CM | POA: Diagnosis not present

## 2022-12-29 DIAGNOSIS — R1013 Epigastric pain: Secondary | ICD-10-CM | POA: Diagnosis not present

## 2022-12-29 DIAGNOSIS — Z189 Retained foreign body fragments, unspecified material: Secondary | ICD-10-CM | POA: Diagnosis not present

## 2022-12-29 DIAGNOSIS — M62830 Muscle spasm of back: Secondary | ICD-10-CM

## 2022-12-29 MED ORDER — OXYCODONE HCL 5 MG PO TABS
5.0000 mg | ORAL_TABLET | Freq: Three times a day (TID) | ORAL | 0 refills | Status: DC | PRN
Start: 2022-12-29 — End: 2023-01-13

## 2022-12-29 MED ORDER — CYCLOBENZAPRINE HCL 10 MG PO TABS
10.0000 mg | ORAL_TABLET | Freq: Three times a day (TID) | ORAL | 3 refills | Status: DC | PRN
Start: 2022-12-29 — End: 2023-02-09

## 2022-12-29 NOTE — Telephone Encounter (Signed)
Patient notified

## 2022-12-29 NOTE — Telephone Encounter (Signed)
DOS: 11/09/22 XLIF L4-5 XLIF and posterior spinal fusion, right L5-S1 far lateral foraminotomy    Surgery: 08/07/2022 (R L4/5 far lateral discectomy)   Per last note, she is still having significant pain. PMP reviewed and is appropriate.   Refill of oxycodone okay, but will change directions to q 8 hours prn. Please let her know.

## 2023-01-03 ENCOUNTER — Encounter: Payer: Self-pay | Admitting: Neurosurgery

## 2023-01-03 DIAGNOSIS — M25551 Pain in right hip: Secondary | ICD-10-CM

## 2023-01-03 DIAGNOSIS — M5416 Radiculopathy, lumbar region: Secondary | ICD-10-CM

## 2023-01-03 DIAGNOSIS — Z981 Arthrodesis status: Secondary | ICD-10-CM

## 2023-01-04 NOTE — Telephone Encounter (Signed)
See her message. I can order tests you discussed at her visit on 12/22/22 (CT myelogram lumbar spine, MRI hip and sacrum, and EMG).   MRI of right hip?   Anything else you want to add?

## 2023-01-04 NOTE — Addendum Note (Signed)
Addended byDrake Leach on: 01/04/2023 04:10 PM   Modules accepted: Orders

## 2023-01-05 ENCOUNTER — Ambulatory Visit: Payer: No Typology Code available for payment source | Admitting: Psychology

## 2023-01-05 DIAGNOSIS — F4323 Adjustment disorder with mixed anxiety and depressed mood: Secondary | ICD-10-CM

## 2023-01-05 NOTE — Progress Notes (Signed)
[facility] Counselor/Therapist Progress Note  Patient ID: Margaret Cuevas, MRN: [id number],    Date: 01/05/2023  Time Spent: 60 mins start time: 1300    end time: 1400  Treatment Type: Individual Therapy  Reported Symptoms: Pt presents for session, via Caregility video, granting consent for the session.  Pt states she is in her home with no one else present; she also shares she understands the limits of virtual sessions.  I shared with pt that I am in my office with no one else here.  Mental Status Exam: Appearance:  Casual     Behavior: Appropriate  Motor: Normal  Speech/Language:  Clear and Coherent  Affect: Appropriate  Mood: normal  Thought process: normal  Thought content:   WNL  Sensory/Perceptual disturbances:   WNL  Orientation: oriented to person, place, and time/date  Attention: Good  Concentration: Good  Memory: WNL  Fund of knowledge:  Good  Insight:   Good  Judgment:  Good  Impulse Control: Good   Risk Assessment: Danger to Self:  No Self-injurious Behavior: No Danger to Others: No Duty to Warn:no Physical Aggression / Violence:No  Access to Firearms a concern: No  Gang Involvement:No   Subjective: Pt shares that she "has been up and down since our last session.  My second back surgery help some with my pain but I think I am going to have to have surgery again.  I am having pain radiating to both my right and left hips.  I have to have several more CT scans.  I know that I am too young to be having all of these physical issues."  Pt shares that she feels very constricted by the limits of her condition; "people often don't realize how much pain I am in."  Pt shares that she is considering applying for Social Security Disability "and I never thought that would be me.  I should be able to do more than what I am able to do right now."  Pt shares she has taken some walks in her neighborhood since our last session.  She shares she had some sort of GI virus  several days as well and was not able to leave the house during those days.  Pt shares that she is very frustrated with her back issues; she wants to be able to get back to being herself.  She did go out briefly earlier today with a friend and that was good for pt.  Pt shares that she appreciates what her husband and her kids have done for her during her recovery.  Encouraged pt to pursue activities on a daily basis that she feels good about and that she knows she can accomplish.  Encouraged pt to give herself grace as she continues her recovery.  We will meet in 2 wks for a follow up session  Interventions: Cognitive Behavioral Therapy  Diagnosis:Adjustment disorder with mixed anxiety and depressed mood  Plan: Treatment Plan Strengths/Abilities:  Intelligent, Intuitive, Willing to participate in therapy Treatment Preferences:  Outpatient Individual Therapy Statement of Needs:  Patient is to use CBT, mindfulness and coping skills to help manage and/or decrease symptoms associated with their diagnosis. Symptoms:  Depressed/Irritable mood, worry, social withdrawal Problems Addressed:  Depressive thoughts, Sadness, Sleep issues, etc. Long Term Goals:  Pt to reduce overall level, frequency, and intensity of the feelings of depression/anxiety as evidenced by decreased irritability, negative self talk, and helpless feelings from 6 to 7 days/week to 0 to 1 days/week, per client report,  for at least 3 consecutive months.  Progress:  Short Term Goals:  Pt to verbally express understanding of the relationship between feelings of depression/anxiety and their impact on thinking patterns and behaviors.  Pt to verbalize an understanding of the role that distorted thinking plays in creating fears, excessive worry, and ruminations.  Progress:  Target Date:  12/23/2023 Frequency:  Bi-weekly Modality:  Cognitive Behavioral Therapy Interventions by Therapist:  Therapist will use CBT, Mindfulness exercises, Coping  skills and Referrals, as needed by client. Client has verbally approved this treatment plan.  Karie Kirks, [facility]

## 2023-01-06 ENCOUNTER — Other Ambulatory Visit: Payer: Self-pay | Admitting: Family Medicine

## 2023-01-06 DIAGNOSIS — N951 Menopausal and female climacteric states: Secondary | ICD-10-CM

## 2023-01-09 ENCOUNTER — Ambulatory Visit
Admission: RE | Admit: 2023-01-09 | Discharge: 2023-01-09 | Disposition: A | Discharge status: Home / Self Care | Payer: No Typology Code available for payment source | Source: Ambulatory Visit | Attending: Orthopedic Surgery | Admitting: Orthopedic Surgery

## 2023-01-09 DIAGNOSIS — M5416 Radiculopathy, lumbar region: Secondary | ICD-10-CM | POA: Diagnosis present

## 2023-01-09 DIAGNOSIS — Z981 Arthrodesis status: Secondary | ICD-10-CM | POA: Insufficient documentation

## 2023-01-09 DIAGNOSIS — M25551 Pain in right hip: Secondary | ICD-10-CM | POA: Diagnosis present

## 2023-01-09 DIAGNOSIS — M25552 Pain in left hip: Secondary | ICD-10-CM | POA: Diagnosis present

## 2023-01-10 ENCOUNTER — Other Ambulatory Visit: Payer: Self-pay | Admitting: Family Medicine

## 2023-01-10 DIAGNOSIS — N951 Menopausal and female climacteric states: Secondary | ICD-10-CM

## 2023-01-11 ENCOUNTER — Encounter: Payer: Self-pay | Admitting: Neurosurgery

## 2023-01-12 NOTE — Progress Notes (Signed)
Patient for DG Lumbar/CT Lumbar Myelogram on Wed 01/13/2023, I called and spoke with the patient on the phone and gave pre-procedure instructions. Pt was made aware to be here at 10:30a. Pt stated understanding.  Called 01/12/2023

## 2023-01-13 ENCOUNTER — Ambulatory Visit
Admission: RE | Admit: 2023-01-13 | Discharge: 2023-01-13 | Disposition: A | Discharge status: Home / Self Care | Payer: No Typology Code available for payment source | Source: Ambulatory Visit | Attending: Orthopedic Surgery | Admitting: Orthopedic Surgery

## 2023-01-13 ENCOUNTER — Other Ambulatory Visit: Payer: Self-pay | Admitting: Family Medicine

## 2023-01-13 ENCOUNTER — Other Ambulatory Visit: Payer: Self-pay | Admitting: Orthopedic Surgery

## 2023-01-13 DIAGNOSIS — M5416 Radiculopathy, lumbar region: Secondary | ICD-10-CM | POA: Insufficient documentation

## 2023-01-13 DIAGNOSIS — Z981 Arthrodesis status: Secondary | ICD-10-CM | POA: Insufficient documentation

## 2023-01-13 DIAGNOSIS — N951 Menopausal and female climacteric states: Secondary | ICD-10-CM

## 2023-01-13 MED ORDER — SODIUM CHLORIDE (PF) 0.9% IJ SOLUTION - NO CHARGE
20.0000 mL | Freq: Once | INTRAMUSCULAR | Status: AC
  Administered 2023-01-13: 5 mL via INTRAVENOUS
  Filled 2023-01-13: qty 20

## 2023-01-13 MED ORDER — ACETAMINOPHEN 325 MG PO TABS
ORAL_TABLET | ORAL | Status: AC
  Filled 2023-01-13: qty 2

## 2023-01-13 MED ORDER — IOHEXOL 180 MG/ML  SOLN
20.0000 mL | Freq: Once | INTRAMUSCULAR | Status: AC | PRN
  Administered 2023-01-13: 20 mL

## 2023-01-13 MED ORDER — METHOCARBAMOL 500 MG PO TABS: 500.0000 mg | ORAL_TABLET | Freq: Once | ORAL | Status: DC

## 2023-01-13 MED ORDER — ACETAMINOPHEN 325 MG PO TABS
650.0000 mg | ORAL_TABLET | Freq: Once | ORAL | Status: AC
  Administered 2023-01-13: 650 mg via ORAL

## 2023-01-13 MED ORDER — LIDOCAINE HCL (PF) 1 % IJ SOLN
10.0000 mL | Freq: Once | INTRAMUSCULAR | Status: AC
  Administered 2023-01-13: 10 mL via SUBCUTANEOUS
  Filled 2023-01-13: qty 10

## 2023-01-13 MED ORDER — METHOCARBAMOL 500 MG PO TABS
500.0000 mg | ORAL_TABLET | Freq: Once | ORAL | Status: AC
  Administered 2023-01-13: 500 mg via ORAL
  Filled 2023-01-13: qty 1

## 2023-01-13 MED ORDER — OXYCODONE HCL 5 MG PO TABS: 5.0000 mg | ORAL_TABLET | Freq: Three times a day (TID) | ORAL | 0 refills | Status: DC | PRN

## 2023-01-13 NOTE — Progress Notes (Signed)
Margaret Ren, np notified that this  patient states her left side from hip to toes feels extremely heavy and pressure feeling with toes numb on left foot and pressure in vagina area.  Rates 7/10

## 2023-01-13 NOTE — Progress Notes (Signed)
Attempting to contact dr. Allena Katz by telephone Alwyn Ren, np  stopped by to assess patient and she is attempting to locate dr. Allena Katz to make him aware

## 2023-01-13 NOTE — Telephone Encounter (Signed)
DOS: 11/09/22 XLIF L4-5 XLIF and posterior spinal fusion, right L5-S1 far lateral foraminotomy    Surgery: 08/07/2022 (R L4/5 far lateral discectomy)     PMP reviewed and is appropriate. Please let her know refill of oxycodone was sent to the pharmacy.

## 2023-01-13 NOTE — Progress Notes (Signed)
Robaxin 500mg  ordered by Alwyn Ren, np patient continues to complain of left leg numbness and heaviness with left toe numbness 7/10 robaxin administered

## 2023-01-13 NOTE — Discharge Instructions (Signed)
Lumbar Puncture, Care After Refer to this sheet in the next few weeks. These instructions provide you with information on caring for yourself after your procedure. Your health care provider may also give you more specific instructions. Your treatment has been planned according to current medical practices, but problems sometimes occur. Call your health care provider if you have any problems or questions after your procedure. What can I expect after the procedure? After your procedure, it is typical to have the following sensations: Mild discomfort or pain at the insertion site. Mild headache that is relieved with pain medicines.  Follow these instructions at home:  Avoid lifting anything heavier than 10 lb (4.5 kg) for at least 12 hours after the procedure. Drink enough fluids to keep your urine clear or pale yellow. Lay flat or as flat as possible for the remainder of the day. Contact a health care provider if: You have fever or chills. You have nausea or vomiting. You have a headache that lasts for more than 2 days. Get help right away if: You have any numbness or tingling in your legs. You are unable to control your bowel or bladder. You have bleeding or swelling in your back at the insertion site. You are dizzy or faint. This information is not intended to replace advice given to you by your health care provider. Make sure you discuss any questions you have with your health care provider. Document Released: 04/18/2013 Document Revised: 09/19/2015 Document Reviewed: 12/20/2012 Elsevier Interactive Patient Education  2017 [employer].

## 2023-01-13 NOTE — Telephone Encounter (Signed)
Patient notified

## 2023-01-15 ENCOUNTER — Other Ambulatory Visit: Payer: Self-pay | Admitting: Family Medicine

## 2023-01-15 DIAGNOSIS — Z9884 Bariatric surgery status: Secondary | ICD-10-CM | POA: Diagnosis not present

## 2023-01-15 DIAGNOSIS — Z713 Dietary counseling and surveillance: Secondary | ICD-10-CM | POA: Diagnosis not present

## 2023-01-15 DIAGNOSIS — K219 Gastro-esophageal reflux disease without esophagitis: Secondary | ICD-10-CM | POA: Diagnosis not present

## 2023-01-15 DIAGNOSIS — M5416 Radiculopathy, lumbar region: Secondary | ICD-10-CM

## 2023-01-15 DIAGNOSIS — K912 Postsurgical malabsorption, not elsewhere classified: Secondary | ICD-10-CM | POA: Diagnosis not present

## 2023-01-18 NOTE — Progress Notes (Unsigned)
   REFERRING PHYSICIAN:  [name], Md [address] [place],  [place] [address]  DOS: 11/09/22 XLIF L4-5 XLIF and posterior spinal fusion, right L5-S1 far lateral foraminotomy   Surgery: 08/07/2022 (R L4/5 far lateral discectomy)    HISTORY OF PRESENT ILLNESS: Margaret Cuevas is status post L4-5 XLIF and posterior spinal fusion, right L5-S1 far lateral foraminotomy.  Unfortunately, she is not doing very well.  She has continued pain down her right leg.  She is slightly better than preop, but has not made much progress.  She is having some swelling around the right sided incision has some tenderness in this area.  She has significant discomfort with sitting for any length of time.  She has been doing extensive physical and Occupational Therapy.   Ortho for hips.  Doesn't need EMG   If not any better at follow up it's time to consider SCS ***   PHYSICAL EXAMINATION:  General: Patient is well developed, well nourished, calm, collected, and in no apparent distress.   NEUROLOGICAL:  General: In no acute distress.   Awake, alert, oriented to person, place, and time.  Pupils equal round and reactive to light.  Facial tone is symmetric.     Strength:            Side Iliopsoas Quads Hamstring PF DF EHL  R 4+ 5 5 5 5 5   L 5 5 5 5 5 5    Incisions c/d/I  Lateral incision looks better with only minimal erythema.    ROS (Neurologic):  Negative except as noted above  IMAGING: No complications noted  ASSESSMENT/PLAN:  Margaret Cuevas is doing fair s/p above surgery.  I was hoping she would be making more improvements at this point.  She will continue with physical occupational therapy.  We will extend her disability pass for parking.  Her occupational therapist has suggested an assistance belt.  She is getting this more information on this, but I will be happy to prescribe this if able.  If she has not improved at her next appointment, I would recommend that we proceed  with CT myelogram of the lumbar spine, MRI scan of the hip and sacrum, and nerve conduction study of the right lower extremity.  I think she may ultimately be a candidate for spinal cord stimulation given how much pain she does suffer from.    Drake Leach PA-C Department of neurosurgery
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
## 2023-01-19 ENCOUNTER — Ambulatory Visit (INDEPENDENT_AMBULATORY_CARE_PROVIDER_SITE_OTHER): Payer: No Typology Code available for payment source | Admitting: Psychology

## 2023-01-19 DIAGNOSIS — F4323 Adjustment disorder with mixed anxiety and depressed mood: Secondary | ICD-10-CM | POA: Diagnosis not present

## 2023-01-19 NOTE — Progress Notes (Signed)
[facility] Counselor/Therapist Progress Note  Patient ID: Margaret Cuevas, MRN: [id number],    Date: 01/19/2023  Time Spent: 60 mins start time: 0900    end time: 1000  Treatment Type: Individual Therapy  Reported Symptoms: Pt presents for session, via Caregility video, granting consent for the session.  Pt states she is in her home with no one else present; she also shares she understands the limits of virtual sessions.  I shared with pt that I am in my office with no one else here.  Mental Status Exam: Appearance:  Casual     Behavior: Appropriate  Motor: Normal  Speech/Language:  Clear and Coherent  Affect: Appropriate  Mood: normal  Thought process: normal  Thought content:   WNL  Sensory/Perceptual disturbances:   WNL  Orientation: oriented to person, place, and time/date  Attention: Good  Concentration: Good  Memory: WNL  Fund of knowledge:  Good  Insight:   Good  Judgment:  Good  Impulse Control: Good   Risk Assessment: Danger to Self:  No Self-injurious Behavior: No Danger to Others: No Duty to Warn:no Physical Aggression / Violence:No  Access to Firearms a concern: No  Gang Involvement:No   Subjective: Pt shares that she "has been doing OK I guess.  I have been through so many emotions since our last session.  My doctor has increased my Cymbalta to 90 mg and that has been helpful for me.  I am putting a lot of effort into my 21 yo and 41 yo sons' educations, making sure they are getting their assignments done and turned it.  I think I may be too much for them.  I feel like I need to always be doing something."  Pt shares that she is still having back pains and can only be pain free if she is laying down.  She is frustrated by not being able to do the things she wants/needs to do around the home.  "I just don't know how to relax; I was not brought up not doing something."  Pt has been doing additional radiology testing recently and has an appt tomorrow  with the surgeon to get the results of the testing and to get the doctor's recommendation.  Pt misses being able to do her job; she was very invested in work and felt accomplished in it; it is very difficult for pt to no longer being able to do the job; it is even difficult for pt to go to church so that is a big loss for her as well.  She is able to watch the services on line but she misses being able to be there in person.  She is also missing the balance of her income that is missing from not working; she is also worried about the pending switch from short term to long term disability.  Pt shares that she has applied for Social Security Disability since our last session.  Pt continues to use her walker anytime she walks, either in the house or outside.  Encouraged pt to be willing to give herself grace in not being able to do the activities that she used to be involved in; she also mentions that she needs to lose weight and may need the bariatric revision surgery as well.  She knows that weight loss would help with her back pain.  Encouraged pt to pursue activities on a daily basis that she feels good about and that she knows she can accomplish.  Encouraged pt to  give herself grace as she continues her recovery.  Encouraged pt to continue with her self care activities and we will meet in 2 wks for a follow up session.  Interventions: Cognitive Behavioral Therapy  Diagnosis:Adjustment disorder with mixed anxiety and depressed mood  Plan: Treatment Plan Strengths/Abilities:  Intelligent, Intuitive, Willing to participate in therapy Treatment Preferences:  Outpatient Individual Therapy Statement of Needs:  Patient is to use CBT, mindfulness and coping skills to help manage and/or decrease symptoms associated with their diagnosis. Symptoms:  Depressed/Irritable mood, worry, social withdrawal Problems Addressed:  Depressive thoughts, Sadness, Sleep issues, etc. Long Term Goals:  Pt to reduce overall level,  frequency, and intensity of the feelings of depression/anxiety as evidenced by decreased irritability, negative self talk, and helpless feelings from 6 to 7 days/week to 0 to 1 days/week, per client report, for at least 3 consecutive months.  Progress:  Short Term Goals:  Pt to verbally express understanding of the relationship between feelings of depression/anxiety and their impact on thinking patterns and behaviors.  Pt to verbalize an understanding of the role that distorted thinking plays in creating fears, excessive worry, and ruminations.  Progress:  Target Date:  12/23/2023 Frequency:  Bi-weekly Modality:  Cognitive Behavioral Therapy Interventions by Therapist:  Therapist will use CBT, Mindfulness exercises, Coping skills and Referrals, as needed by client. Client has verbally approved this treatment plan.  Karie Kirks, [facility]

## 2023-01-20 ENCOUNTER — Ambulatory Visit (INDEPENDENT_AMBULATORY_CARE_PROVIDER_SITE_OTHER): Payer: No Typology Code available for payment source | Admitting: Orthopedic Surgery

## 2023-01-20 ENCOUNTER — Encounter: Payer: Self-pay | Admitting: Orthopedic Surgery

## 2023-01-20 VITALS — BP 132/76 | Ht 66.0 in | Wt 256.0 lb

## 2023-01-20 DIAGNOSIS — M5416 Radiculopathy, lumbar region: Secondary | ICD-10-CM

## 2023-01-20 DIAGNOSIS — Z09 Encounter for follow-up examination after completed treatment for conditions other than malignant neoplasm: Secondary | ICD-10-CM

## 2023-01-20 DIAGNOSIS — Z981 Arthrodesis status: Secondary | ICD-10-CM

## 2023-01-20 NOTE — Patient Instructions (Signed)
It was nice to see you today.   I am sorry you are still hurting so much.   We recommend that you consider seeing pain  management for a spinal cord stimulator. You would need to get an MRI of your mid back and also do a consultation with a psychiatrist prior to doing this. Let me know if you want me to get these things scheduled.   I will review the things we discussed with Dr. Myer Haff and message you. Will schedule follow up at that time as well.   Let me know if you need anything else.   Drake Leach PA-C [phone]

## 2023-01-21 DIAGNOSIS — M5416 Radiculopathy, lumbar region: Secondary | ICD-10-CM

## 2023-01-21 DIAGNOSIS — Z981 Arthrodesis status: Secondary | ICD-10-CM

## 2023-01-22 DIAGNOSIS — G4733 Obstructive sleep apnea (adult) (pediatric): Secondary | ICD-10-CM | POA: Diagnosis not present

## 2023-01-22 NOTE — Telephone Encounter (Signed)
Patient scheduled 12.31.24 with Dr.Y.

## 2023-01-22 NOTE — Telephone Encounter (Signed)
Please send psych referral to [facility] medicine.

## 2023-01-22 NOTE — Telephone Encounter (Signed)
Please schedule her a f/u with Myer Haff in 3 months.

## 2023-01-25 ENCOUNTER — Ambulatory Visit (INDEPENDENT_AMBULATORY_CARE_PROVIDER_SITE_OTHER): Payer: No Typology Code available for payment source | Admitting: Psychiatry

## 2023-01-25 ENCOUNTER — Encounter: Payer: Self-pay | Admitting: Psychiatry

## 2023-01-25 VITALS — BP 122/82 | HR 94 | Temp 98.5°F | Ht 66.0 in | Wt 258.4 lb

## 2023-01-25 DIAGNOSIS — F419 Anxiety disorder, unspecified: Secondary | ICD-10-CM

## 2023-01-25 DIAGNOSIS — F321 Major depressive disorder, single episode, moderate: Secondary | ICD-10-CM

## 2023-01-25 MED ORDER — DULOXETINE HCL 30 MG PO CPEP: 30.0000 mg | ORAL_CAPSULE | Freq: Every day | ORAL | 1 refills | Status: DC

## 2023-01-25 NOTE — Patient Instructions (Addendum)
Continue duloxetine 90 mg daily Continue BuSpar 7.5 mg twice a day Next appointment: 11/27 at 11 30 am  Obtain lab- TSH

## 2023-01-26 ENCOUNTER — Encounter: Payer: Self-pay | Admitting: Psychiatry

## 2023-01-26 LAB — TSH: TSH: 3.36 u[IU]/mL (ref 0.450–4.500)

## 2023-01-27 ENCOUNTER — Encounter: Payer: Self-pay | Admitting: Neurosurgery

## 2023-01-27 ENCOUNTER — Ambulatory Visit
Admission: RE | Admit: 2023-01-27 | Discharge: 2023-01-27 | Disposition: A | Discharge status: Home / Self Care | Payer: No Typology Code available for payment source | Attending: Orthopedic Surgery | Admitting: Orthopedic Surgery

## 2023-01-27 ENCOUNTER — Ambulatory Visit
Admission: RE | Admit: 2023-01-27 | Discharge: 2023-01-27 | Disposition: A | Discharge status: Home / Self Care | Payer: No Typology Code available for payment source | Source: Ambulatory Visit | Attending: Orthopedic Surgery | Admitting: *Deleted

## 2023-01-27 ENCOUNTER — Other Ambulatory Visit: Payer: Self-pay | Admitting: Orthopedic Surgery

## 2023-01-27 DIAGNOSIS — M5416 Radiculopathy, lumbar region: Secondary | ICD-10-CM

## 2023-01-27 DIAGNOSIS — Z981 Arthrodesis status: Secondary | ICD-10-CM

## 2023-01-27 MED ORDER — OXYCODONE HCL 5 MG PO TABS: 5.0000 mg | ORAL_TABLET | Freq: Three times a day (TID) | ORAL | 0 refills | Status: DC | PRN

## 2023-01-27 NOTE — Addendum Note (Signed)
Addended byDrake Leach on: 01/27/2023 02:30 PM   Modules accepted: Orders

## 2023-01-27 NOTE — Telephone Encounter (Signed)
PMP reviewed and appropriate. Refill of oxycodone sent to pharmacy.   Cannot do a 30 day supply. Will send her a MyChart message.

## 2023-01-28 ENCOUNTER — Encounter: Payer: No Typology Code available for payment source | Admitting: Orthopedic Surgery

## 2023-01-29 ENCOUNTER — Ambulatory Visit
Admission: RE | Admit: 2023-01-29 | Discharge: 2023-01-29 | Disposition: A | Discharge status: Home / Self Care | Payer: No Typology Code available for payment source | Source: Ambulatory Visit | Attending: Orthopedic Surgery | Admitting: Orthopedic Surgery

## 2023-01-29 DIAGNOSIS — Z981 Arthrodesis status: Secondary | ICD-10-CM | POA: Insufficient documentation

## 2023-01-29 DIAGNOSIS — M5416 Radiculopathy, lumbar region: Secondary | ICD-10-CM | POA: Diagnosis present

## 2023-02-01 DIAGNOSIS — M5459 Other low back pain: Secondary | ICD-10-CM | POA: Diagnosis not present

## 2023-02-01 DIAGNOSIS — Z6841 Body Mass Index (BMI) 40.0 and over, adult: Secondary | ICD-10-CM | POA: Diagnosis not present

## 2023-02-01 DIAGNOSIS — Z87891 Personal history of nicotine dependence: Secondary | ICD-10-CM | POA: Diagnosis not present

## 2023-02-01 DIAGNOSIS — Z713 Dietary counseling and surveillance: Secondary | ICD-10-CM | POA: Diagnosis not present

## 2023-02-01 DIAGNOSIS — R29898 Other symptoms and signs involving the musculoskeletal system: Secondary | ICD-10-CM | POA: Diagnosis not present

## 2023-02-01 DIAGNOSIS — Z01818 Encounter for other preprocedural examination: Secondary | ICD-10-CM | POA: Diagnosis not present

## 2023-02-01 DIAGNOSIS — Z9884 Bariatric surgery status: Secondary | ICD-10-CM | POA: Diagnosis not present

## 2023-02-01 DIAGNOSIS — K2960 Other gastritis without bleeding: Secondary | ICD-10-CM | POA: Diagnosis not present

## 2023-02-01 DIAGNOSIS — K912 Postsurgical malabsorption, not elsewhere classified: Secondary | ICD-10-CM | POA: Diagnosis not present

## 2023-02-01 DIAGNOSIS — M6281 Muscle weakness (generalized): Secondary | ICD-10-CM | POA: Diagnosis not present

## 2023-02-03 ENCOUNTER — Ambulatory Visit (INDEPENDENT_AMBULATORY_CARE_PROVIDER_SITE_OTHER): Payer: No Typology Code available for payment source | Admitting: Psychology

## 2023-02-03 DIAGNOSIS — F4323 Adjustment disorder with mixed anxiety and depressed mood: Secondary | ICD-10-CM | POA: Diagnosis not present

## 2023-02-03 NOTE — Progress Notes (Signed)
[facility] Counselor/Therapist Progress Note  Patient ID: Margaret Cuevas, MRN: [id number],    Date: 02/03/2023  Time Spent: 45 mins start time: 0900    end time: 9045  Treatment Type: Individual Therapy  Reported Symptoms: Pt presents for session, via [facility] video, granting consent for the session.  Pt states she is in her home with no one else present; she also shares she understands the limits of virtual sessions.  I shared with pt that I am in my office with no one else here.  Mental Status Exam: Appearance:  Casual     Behavior: Appropriate  Motor: Normal  Speech/Language:  Clear and Coherent  Affect: Appropriate  Mood: normal  Thought process: normal  Thought content:   WNL  Sensory/Perceptual disturbances:   WNL  Orientation: oriented to person, place, and time/date  Attention: Good  Concentration: Good  Memory: WNL  Fund of knowledge:  Good  Insight:   Good  Judgment:  Good  Impulse Control: Good   Risk Assessment: Danger to Self:  No Self-injurious Behavior: No Danger to Others: No Duty to Warn:no Physical Aggression / Violence:No  Access to Firearms a concern: No  Gang Involvement:No   Subjective: Pt shares that she "has been doing OK I guess.  I have had a lot going on.  I am going to see a pain management clinic for a trial of a spinal cord stimulator on 10/17.  I also have to see another psychologist this Friday related to the gastric surgery; she is working to get it scheduled after Friday's evaluation and then they will submit the info to the insurance company for approval."  Pt understands why both teams are doing for her what they are doing; she is hopeful for both surgeries.  Pt shares that she has been engaging in self care activities and has been taking time for herself as well.  She has also been trying to take care of small chores in the home and has been trying to cook for the family as she is physically able to do so.  If she cannot do  these activities, she is trying to recover in her recliner or in her bed.  She is careful to try not to over do in her activities.  Pt shares that she is going to aquatic therapy at [facility] in [facility]; she is scheduled for twice per week for 3 wks to see if it will benefit her recovery.  She shares she has some anxiety related to the therapy, since she has never done aquatic therapy before.  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  Pt shares that she has not been sleeping well; she does use her CPAP machine; her back is keeping her from getting comfortable to sleep.  She shares she wakes up at least 3-4 times per night.  She does not want to get back on sleeping medication because of the other strong medication she is taking for her pain.  Pt shares she continues to take her Cymbalta 90 mg daily and feels like it is helpful.  Pt's mom and sister are here this week visiting with pt and she appreciates them being here.  Encouraged pt to remember to use her deep breathing to help her relax and to breathe through her pain episodes.  Encouraged pt to be willing to give herself grace in not being able to do the activities that she used  to be involved in.  Encouraged pt to continue with her self care activities and we will meet in 2 wks for a follow up session.  Interventions: Cognitive Behavioral Therapy  Diagnosis:Adjustment disorder with mixed anxiety and depressed mood  Plan: Treatment Plan Strengths/Abilities:  Intelligent, Intuitive, Willing to participate in therapy Treatment Preferences:  Outpatient Individual Therapy Statement of Needs:  Patient is to use CBT, mindfulness and coping skills to help manage and/or decrease symptoms associated with their diagnosis. Symptoms:  Depressed/Irritable mood, worry, social withdrawal Problems Addressed:  Depressive thoughts, Sadness, Sleep issues, etc. Long Term Goals:  Pt to reduce overall  level, frequency, and intensity of the feelings of depression/anxiety as evidenced by decreased irritability, negative self talk, and helpless feelings from 6 to 7 days/week to 0 to 1 days/week, per client report, for at least 3 consecutive months.  Progress:  Short Term Goals:  Pt to verbally express understanding of the relationship between feelings of depression/anxiety and their impact on thinking patterns and behaviors.  Pt to verbalize an understanding of the role that distorted thinking plays in creating fears, excessive worry, and ruminations.  Progress:  Target Date:  12/23/2023 Frequency:  Bi-weekly Modality:  Cognitive Behavioral Therapy Interventions by Therapist:  Therapist will use CBT, Mindfulness exercises, Coping skills and Referrals, as needed by client. Client has verbally approved this treatment plan.  Karie Kirks, [facility]

## 2023-02-04 DIAGNOSIS — M25551 Pain in right hip: Secondary | ICD-10-CM

## 2023-02-09 ENCOUNTER — Other Ambulatory Visit: Payer: Self-pay | Admitting: Orthopedic Surgery

## 2023-02-09 ENCOUNTER — Other Ambulatory Visit: Payer: Self-pay | Admitting: Neurosurgery

## 2023-02-09 DIAGNOSIS — Z981 Arthrodesis status: Secondary | ICD-10-CM

## 2023-02-09 DIAGNOSIS — M62830 Muscle spasm of back: Secondary | ICD-10-CM

## 2023-02-09 MED ORDER — OXYCODONE HCL 5 MG PO TABS: 5.0000 mg | ORAL_TABLET | Freq: Three times a day (TID) | ORAL | 0 refills | Status: DC | PRN

## 2023-02-09 NOTE — Telephone Encounter (Signed)
DOS: 11/09/22 XLIF L4-5 XLIF and posterior spinal fusion, right L5-S1 far lateral foraminotomy    Surgery: 08/07/2022 (R L4/5 far lateral discectomy)    Discussed with Dr. Myer Haff. Okay to continue on prn oxycodone for now. PMP reviewed and is appropriate.   Message sent to patient.

## 2023-02-11 ENCOUNTER — Encounter: Payer: Self-pay | Admitting: Student in an Organized Health Care Education/Training Program

## 2023-02-11 ENCOUNTER — Ambulatory Visit
Admission: RE | Admit: 2023-02-11 | Discharge: 2023-02-11 | Disposition: A | Discharge status: Home / Self Care | Payer: No Typology Code available for payment source | Source: Ambulatory Visit | Attending: Student in an Organized Health Care Education/Training Program | Admitting: Student in an Organized Health Care Education/Training Program

## 2023-02-11 ENCOUNTER — Ambulatory Visit
Payer: No Typology Code available for payment source | Attending: Student in an Organized Health Care Education/Training Program | Admitting: Student in an Organized Health Care Education/Training Program

## 2023-02-11 VITALS — BP 128/87 | HR 83 | Temp 97.3°F | Resp 17 | Ht 65.0 in | Wt 253.8 lb

## 2023-02-11 DIAGNOSIS — M533 Sacrococcygeal disorders, not elsewhere classified: Secondary | ICD-10-CM | POA: Diagnosis present

## 2023-02-11 DIAGNOSIS — M25551 Pain in right hip: Secondary | ICD-10-CM | POA: Diagnosis not present

## 2023-02-11 DIAGNOSIS — M160 Bilateral primary osteoarthritis of hip: Secondary | ICD-10-CM | POA: Diagnosis not present

## 2023-02-11 DIAGNOSIS — G8929 Other chronic pain: Secondary | ICD-10-CM | POA: Diagnosis present

## 2023-02-11 DIAGNOSIS — M25552 Pain in left hip: Secondary | ICD-10-CM | POA: Diagnosis not present

## 2023-02-11 DIAGNOSIS — G894 Chronic pain syndrome: Secondary | ICD-10-CM | POA: Diagnosis present

## 2023-02-11 DIAGNOSIS — M5416 Radiculopathy, lumbar region: Secondary | ICD-10-CM | POA: Insufficient documentation

## 2023-02-11 DIAGNOSIS — M797 Fibromyalgia: Secondary | ICD-10-CM | POA: Diagnosis present

## 2023-02-11 DIAGNOSIS — M961 Postlaminectomy syndrome, not elsewhere classified: Secondary | ICD-10-CM | POA: Diagnosis present

## 2023-02-11 DIAGNOSIS — M461 Sacroiliitis, not elsewhere classified: Secondary | ICD-10-CM

## 2023-02-11 DIAGNOSIS — M1611 Unilateral primary osteoarthritis, right hip: Secondary | ICD-10-CM | POA: Diagnosis not present

## 2023-02-11 NOTE — Progress Notes (Signed)
Patient: Margaret Cuevas  Service Category: E/M  Provider: Edward Jolly, MD  DOB: December 13, 1975  DOS: 02/11/2023  Referring Provider: Gardiner Rhyme  MRN: [id number]  Setting: Ambulatory outpatient  PCP: [name], MD  Type: New Patient  Specialty: Interventional Pain Management    Location: Office  Delivery: Face-to-face     Primary Reason(s) for Visit: Encounter for initial evaluation of one or more chronic problems (new to examiner) potentially causing chronic pain, and posing a threat to normal musculoskeletal function. (Level of risk: High) CC: Back Pain  HPI  Margaret Cuevas is a 47 y.o. year old, female patient, who comes for the first time to our practice referred by Drake Leach, PA-C for our initial evaluation of her chronic pain. She has Influenza vaccine needed; Fibromyalgia; Lipoma of back; H/O total hysterectomy; History of appendectomy; OSA on CPAP; Anxiety, generalized; Chronic radicular lumbar pain; S/P lumbar fusion; Recurrent herniation of lumbar disc; Spinal stenosis of lumbar region without neurogenic claudication; Lumbar post-laminectomy syndrome; and Chronic pain syndrome on their problem list. Today she comes in for evaluation of her Back Pain  Pain Assessment: Location: Lower Back Radiating: through hips, buttocks bilat; down back of right leg (throbbing), to right groin, to right toes, first 3 of right foot are numb Onset: More than a month ago Duration: Chronic pain Quality: Numbness, Stabbing, Sharp, Constant, Throbbing Severity: 8 /10 (subjective, self-reported pain score)  Effect on ADL: limits daily activities, only comfortable when in recliner Timing: Constant Modifying factors: ice packs, meds BP: 128/87  HR: 83  Onset and Duration: Sudden, Gradual, and Present longer than 3 months Cause of pain: Unknown Severity: NAS-11 at its worse: 10/10, NAS-11 at its best: 5/10, NAS-11 now: 7/10, and NAS-11 on the average: 7/10 Timing: Morning, Afternoon, Night, During  activity or exercise, After activity or exercise, and After a period of immobility Aggravating Factors: Bending, Bowel movements, Climbing, Intercourse (sex), Kneeling, Lifiting, Prolonged sitting, Prolonged standing, Squatting, Twisting, Walking, Walking uphill, Walking downhill, and Working Alleviating Factors: Cold packs, Lying down, Medications, Resting, TENS, Relaxation therapy, and Warm showers or baths Associated Problems: Day-time cramps, Night-time cramps, Depression, Dizziness, Inability to concentrate, Numbness, Personality changes, Sadness, Spasms, Sweating, Swelling, Temperature changes, Tingling, Weakness, Pain that wakes patient up, and Pain that does not allow patient to sleep Quality of Pain: Aching, Agonizing, Annoying, Constant, Intermittent, Deep, Disabling, Distressing, Dreadful, Exhausting, Fearful, Feeling of constriction, Feeling of weight, Heavy, Horrible, Lancinating, Nagging, Pressure-like, Sharp, Shooting, Stabbing, Superficial, Tender, Tingling, Tiring, Toothache-like, and Uncomfortable Previous Examinations or Tests: CT scan, Ct-Myelogram, EMG/PNCV, Endoscopy, MRI scan, Myelogram, Nerve block, X-rays, Nerve conduction test, Neurological evaluation, Neurosurgical evaluation, and Psychiatric evaluation Previous Treatments: Epidural steroid injections, Facet blocks, Narcotic medications, Physical Therapy, Pool exercises, Relaxation therapy, Steroid treatments by mouth, Strengthening exercises, Stretching exercises, and TENS  Margaret Cuevas is being evaluated for possible interventional pain management therapies for the treatment of her chronic pain.   Margaret Cuevas is a very pleasant 47 year old female who presents with a chief complaint of low back pain with radiation into bilateral hips and down her right leg in a dermatomal fashion.  She is status post right L4-L5 lateral discectomy on 08/07/2022 followed by L4-L5 posterior fusion and right L5-S1 lateral foraminotomy on 11/09/2022.  She  is having pain overlying her SI joint and piriformis as well.  She has been evaluated by neurosurgery.  They have recommended spinal cord stimulation evaluation.  Of note, patient has done physical therapy in the past with limited response,  this was within the last year and she has also had epidural steroid injections prior to her spine surgery without any benefit.  She is also had facet blocks that were not helpful.  She is on multimodal analgesics as below.  These are to be continued with primary care provider and respective prescribing providers.  Of note, she has completed her thoracic MRI, formal read has not been published yet.  She has scheduled for a psych eval later this month.  She also has a history of fibromyalgia.  Meds   Current Outpatient Medications:    acetaminophen (TYLENOL) 650 MG CR tablet, Take 1,300 mg by mouth every 8 (eight) hours as needed for pain., Disp: , Rfl:    busPIRone (BUSPAR) 7.5 MG tablet, TAKE 1 TABLET(7.5 MG) BY MOUTH TWICE DAILY, Disp: 180 tablet, Rfl: 0   celecoxib (CELEBREX) 100 MG capsule, Take 1 capsule by mouth 2 (two) times daily. Rheum, Disp: , Rfl:    cyclobenzaprine (FLEXERIL) 10 MG tablet, TAKE 1 TABLET(10 MG) BY MOUTH THREE TIMES DAILY AS NEEDED FOR MUSCLE SPASMS, Disp: 90 tablet, Rfl: 3   DULoxetine (CYMBALTA) 30 MG capsule, Take 1 capsule (30 mg total) by mouth daily. Total of 90 mg daily, take along with 60 mg cap, Disp: 30 capsule, Rfl: 1   DULoxetine (CYMBALTA) 60 MG capsule, Take 60 mg by mouth every morning. And then an additional if needed, Disp: , Rfl:    estradiol (VIVELLE-DOT) 0.1 MG/24HR patch, Place 1 patch (0.1 mg total) onto the skin 2 (two) times a week. Dr Feliberto Gottron, Disp: 8 patch, Rfl: 0   gabapentin (NEURONTIN) 600 MG tablet, Take 2 tablets (1,200 mg total) by mouth 3 (three) times daily., Disp: 180 tablet, Rfl: 1   omeprazole (PRILOSEC) 40 MG capsule, Take 40 mg by mouth daily., Disp: , Rfl:    oxyCODONE (ROXICODONE) 5 MG  immediate release tablet, Take 1 tablet (5 mg total) by mouth every 8 (eight) hours as needed for severe pain (pain score 7-10)., Disp: 21 tablet, Rfl: 0   senna (SENOKOT) 8.6 MG TABS tablet, Take 1 tablet (8.6 mg total) by mouth 2 (two) times daily., Disp: 120 tablet, Rfl: 0   sucralfate (CARAFATE) 1 g tablet, Take 1 g by mouth 4 (four) times daily., Disp: , Rfl:   Imaging Review   Narrative CLINICAL DATA:  Syncopal episode.  Patient unresponsive.  Fell.  EXAM: CT HEAD WITHOUT CONTRAST  CT CERVICAL SPINE WITHOUT CONTRAST  TECHNIQUE: Multidetector CT imaging of the head and cervical spine was performed following the standard protocol without intravenous contrast. Multiplanar CT image reconstructions of the cervical spine were also generated.  COMPARISON:  None.  FINDINGS: CT HEAD FINDINGS  Brain: The brain shows a normal appearance without evidence of malformation, atrophy, old or acute small or large vessel infarction, mass lesion, hemorrhage, hydrocephalus or extra-axial collection.  Vascular: No hyperdense vessel. No evidence of atherosclerotic calcification.  Skull: Normal.  No traumatic finding.  No focal bone lesion.  Sinuses/Orbits: Sinuses are clear. Orbits appear normal. Mastoids are clear.  Other: None significant  CT CERVICAL SPINE FINDINGS  Alignment: The study suffers from considerable motion degradation. There is mild cervicothoracic curvature. There is straightening of the normal cervical lordosis. I do not see any evidence of fracture or traumatic malalignment. No advanced degenerative changes.  Skull base and vertebrae: Negative  Soft tissues and spinal canal: Negative  Disc levels:  Negative  Upper chest: Negative  Other: None  IMPRESSION: Head CT: Normal.  Cervical spine CT: No acute or traumatic finding. Motion degraded study. Some spinal curvature, but no significant disease suspected.   Electronically Signed By: Paulina Fusi  M.D. On: 05/22/2017 08:35   MR LUMBAR SPINE WO CONTRAST  Narrative CLINICAL DATA:  Chronic, progressively worsening low back pain radiating into the right hip. No injury or prior surgery.  EXAM: MRI LUMBAR SPINE WITHOUT CONTRAST  TECHNIQUE: Multiplanar, multisequence MR imaging of the lumbar spine was performed. No intravenous contrast was administered.  COMPARISON:  CT lumbar spine dated March 25, 2022.  FINDINGS: Segmentation:  Standard.  Alignment:  Physiologic.  Vertebrae:  No fracture, evidence of discitis, or bone lesion.  Conus medullaris and cauda equina: Conus extends to the L1-L2 level. Conus and cauda equina appear normal.  Paraspinal and other soft tissues: Negative.  Disc levels:  T12-L1:  Negative.  L1-L2:  Negative.  L2-L3:  Minimal disc bulging.  No stenosis.  L3-L4:  Mild disc bulging.  No stenosis.  L4-L5: Mild disc bulging with unchanged superimposed large right foraminal and far lateral disc protrusion contacting and displacing the exiting right L4 nerve root. Mild right neuroforaminal stenosis. No spinal canal or left neuroforaminal stenosis.  L5-S1: Unchanged circumferential disc osteophyte complex eccentric to the right. Unchanged mild bilateral facet arthropathy. Unchanged mild-to-moderate bilateral neuroforaminal stenosis. No spinal canal stenosis.  IMPRESSION: 1. Unchanged large right foraminal and far lateral disc protrusion at L4-L5 contacting and displacing the exiting right L4 nerve root. 2. 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   Electronically Signed By: Obie Dredge M.D. On: 05/15/2022 10:24    Narrative CLINICAL DATA:  Low back pain, prior surgery, new symptoms. Lumbar radiculopathy.  EXAM: MRI LUMBAR SPINE WITHOUT AND WITH CONTRAST  TECHNIQUE: Multiplanar and multiecho pulse sequences of the lumbar spine were obtained without and with intravenous contrast.  CONTRAST:   10mL GADAVIST GADOBUTROL 1 MMOL/ML IV SOLN  COMPARISON:  MRI of the lumbar spine May 14, 2022.  FINDINGS: Segmentation:  Standard.  Alignment:  Physiologic.  Vertebrae: No fracture, evidence of discitis, or bone lesion. Endplate degenerative changes at L5-S1. Postsurgical changes from right L4-5 discectomy and foraminotomy.  Conus medullaris and cauda equina: Conus extends to the L1-2 level. Conus and cauda equina appear normal.  Paraspinal and other soft tissues: Postsurgical fluid collection in the subcutaneous to the right of midline at the L4-5 level measuring approximately 3.1 x 2.3 x 1.4 cm.  Disc levels:  T12-L1: No spinal canal or neural foraminal stenosis.  L1-2: No spinal canal or neural foraminal stenosis.  L2-3: No spinal canal or neural foraminal stenosis.  L3-4: Minimal disc bulge and mild facet degenerative changes, right greater than left. No significant spinal canal or neural foraminal stenosis.  L4-5: Postsurgical change from right discectomy and foraminotomy. Minimal disc bulge with enhancing right foraminal disc protrusion and prominent enhancement of the foraminal fat and right facet joint. Findings result in moderate narrowing of the right neural foramen. No spinal canal or left neural foraminal narrowing.  L5-S1: Loss of disc height, disc bulge with associated osteophytic component and mild facet degenerative changes. Findings result in moderate bilateral neural foraminal narrowing. No significant spinal canal stenosis. No significant change from prior MRI.  IMPRESSION: 1. Postsurgical changes from right L4-5 discectomy and foraminotomy. There is residual right foraminal disc protrusion with postsurgical enhancement of the right foraminal fat, resulting in moderate narrowing. 2. Moderate bilateral neural foraminal narrowing at L5-S1, unchanged. 3. No high-grade spinal canal stenosis at any level. 4.  Postsurgical fluid collection in the  subcutaneous soft tissues to the right of midline at the L4-5 level measuring up to 3.1 cm.   Electronically Signed By: Baldemar Lenis M.D. On: 09/28/2022 10:02    Narrative CLINICAL DATA:  Back pain.  EXAM: CT LUMBAR SPINE WITHOUT CONTRAST  TECHNIQUE: Multidetector CT imaging of the lumbar spine was performed without intravenous contrast administration. Multiplanar CT image reconstructions were also generated.  RADIATION DOSE REDUCTION: This exam was performed according to the departmental dose-optimization program which includes automated exposure control, adjustment of the mA and/or kV according to patient size and/or use of iterative reconstruction technique.  COMPARISON:  None Available.  FINDINGS: Segmentation: 5 lumbar type vertebrae.  Alignment: Normal.  Vertebrae: No acute fracture or aggressive osseous lesion.  Paraspinal and other soft tissues: Negative.  Other: Mild osteoarthritis of bilateral SI joints.  Disc levels:  Disc spaces: Disc spaces are maintained.  T12-L1: No disc protrusion, foraminal stenosis or central canal stenosis.  L1-L2: No disc protrusion, foraminal stenosis or central canal stenosis.  L2-L3: No disc protrusion, foraminal stenosis or central canal stenosis.  L3-L4: No disc protrusion, foraminal stenosis or central canal stenosis.  L4-L5: Broad right foraminal/lateral disc protrusion contacting the right exiting L4 nerve root. No left foraminal stenosis. Mild right foraminal stenosis. No spinal stenosis.  L5-S1: Broad-based disc bulge. Moderate bilateral foraminal stenosis. Mild bilateral facet arthropathy.  IMPRESSION: 1. No acute osseous injury of the lumbar spine. 2. At L4-5 there is a broad right foraminal/lateral disc protrusion contacting the right exiting L4 nerve root. Mild right foraminal stenosis. 3. At L5-S1 there is a broad-based disc bulge. Moderate bilateral foraminal  stenosis.   Electronically Signed By: Elige Ko M.D. On: 03/25/2022 13:54    CT LUMBAR SPINE W CONTRAST  Narrative CLINICAL DATA:  Patient with a history of chronic low back pain, lumbar radiculopathy and posterior fusion at L4-L5.  EXAM: LUMBAR MYELOGRAM  FLUOROSCOPY: Radiation exposure index as provided by the fluoroscopic device 61.70 mGy kerma  PROCEDURE: After thorough discussion of risks and benefits of the procedure including bleeding, infection, injury to nerves, blood vessels, adjacent structures as well as headache and CSF leak, written and oral informed consent was obtained. Consent was obtained by Alwyn Ren, NP. Time out form was completed.  Patient was positioned prone on the fluoroscopy table. Local anesthesia was provided with 1% lidocaine without epinephrine after prepped and draped in the usual sterile fashion. Puncture was performed at L3/L4 using a 3 1/2 inch 22-gauge spinal needle via MIDLINE approach. Using a single pass through the dura, the needle was placed within the thecal sac, with return of clear CSF. 20 mL of Omnipaque 180 was injected into the thecal sac, with normal opacification of the nerve roots and cauda equina consistent with free flow within the subarachnoid space.  I personally performed the lumbar puncture and administered the intrathecal contrast. I also personally supervised acquisition of the myelogram images.  TECHNIQUE: Contiguous axial images were obtained through the Lumbar spine after the intrathecal infusion of infusion. Coronal and sagittal reconstructions were obtained of the axial image sets.  COMPARISON:  MRI lumbar spine 09/19/2022  FINDINGS: LUMBAR MYELOGRAM FINDINGS:  Intrathecal contrast opacifying the thecal sac.  CT LUMBAR MYELOGRAM FINDINGS:  Segmentation:  Standard.  Alignment:  Physiologic.  Vertebrae: No acute fracture, evidence of discitis, or aggressive bone lesion.  Conus  medullaris and cauda equina: Conus extends to the L1-2 level. Conus and cauda equina appear normal.  Paraspinal and other soft tissues: No acute paraspinal abnormality. Prior gastric bypass.  Disc levels:  Disc spaces: Posterior lumbar interbody fusion at L4-5. Degenerative disease with disc height loss at L5-S1.  T12-L1: No significant disc bulge. No neural foraminal stenosis. No central canal stenosis. Mild right facet arthropathy.  L1-L2: No significant disc bulge. No neural foraminal stenosis. No central canal stenosis.  L2-L3: No significant disc bulge. No neural foraminal stenosis. No central canal stenosis.  L3-L4: No significant disc bulge. No neural foraminal stenosis. No central canal stenosis.  L4-L5: Posterior lumbar interbody fusion. Right foraminal osseous ridging resulting in moderate right foraminal stenosis. No left foraminal stenosis. No spinal stenosis.  L5-S1: Mild broad-based disc bulge. Mild bilateral facet arthropathy. Moderate bilateral foraminal stenosis, left greater than right. No spinal stenosis.  IMPRESSION: LUMBAR MYELOGRAM IMPRESSION:  1. Successful fluoroscopic guided lumbar myelogram prior to CT.  CT LUMBAR MYELOGRAM IMPRESSION:  1. At L4-5 there is posterior lumbar interbody fusion. Right foraminal osseous ridging resulting in moderate right foraminal stenosis. 2. At L5-S1 there is a mild broad-based disc bulge. Mild bilateral facet arthropathy. Moderate bilateral foraminal stenosis, left greater than right.   Electronically Signed By: Elige Ko M.D. On: 01/13/2023 12:30    DG Lumbar Spine 2-3 Views  Narrative CLINICAL DATA:  Posterior L4-5 fusion 2 months ago, lumbar radiculopathy, low back pain  EXAM: LUMBAR SPINE - 2-3 VIEW  COMPARISON:  09/19/2022 lumbar spine MRI  FINDINGS: Patient is status post posterior fusion and interbody spacer at L4-5. Disc space narrowing at L3-4 and L5-S1 also noted.  Preserved vertebral body heights. No acute osseous finding. Hardware unremarkable. Normal SI joints for age. No acute abnormality.  IMPRESSION: 1. Status post L4-5 posterior fusion without complicating feature. 2. Degenerative changes as above.   Electronically Signed By: Judie Petit.  Shick M.D. On: 12/28/2022 20:39  MR HIP RIGHT WO CONTRAST  Narrative CLINICAL DATA:  Low back pain, bilateral hip pain  EXAM: MR OF THE RIGHT HIP WITHOUT CONTRAST  MR OF THE LEFT HIP WITHOUT CONTRAST  TECHNIQUE: Multiplanar, multisequence MR imaging of the right hip was performed. No intravenous contrast was administered.  Multiplanar, multisequence MR imaging of the left hip was performed. No intravenous contrast was administered.  COMPARISON:  None Available.  FINDINGS: Bones:  No hip fracture, dislocation or avascular necrosis.  No periosteal reaction or bone destruction. No aggressive osseous lesion.  Normal sacrum and sacroiliac joints. No SI joint widening or erosive changes.  Posterior lumbar interbody fusion at L4-5. Degenerative disease with disc height loss at L5-S1.  Articular cartilage and labrum  Articular cartilage: Partial-thickness cartilage loss of the right femoral head and acetabulum. Partial-thickness cartilage loss of the left femoral head and acetabulum.  Labrum: Grossly intact, but evaluation is limited by lack of intraarticular fluid.  Joint or bursal effusion  Joint effusion:  No hip joint effusion.  No SI joint effusion.  Bursae:  No bursal fluid.  Muscles and tendons  Flexors: Normal.  Extensors: Normal.  Abductors: Normal.  Adductors: Normal.  Gluteals: Normal.  Hamstrings: Normal.  Other findings  No pelvic free fluid. No fluid collection or hematoma. No inguinal lymphadenopathy. No inguinal hernia.  IMPRESSION: 1. Mild-moderate osteoarthritis of bilateral hips. 2. No hip fracture, dislocation or avascular necrosis.   Electronically  Signed By: Elige Ko M.D. On: 01/13/2023 12:34   MR HIP LEFT WO CONTRAST  Narrative CLINICAL DATA:  Low back pain, bilateral hip pain  EXAM: MR OF THE RIGHT HIP WITHOUT  CONTRAST  MR OF THE LEFT HIP WITHOUT CONTRAST  TECHNIQUE: Multiplanar, multisequence MR imaging of the right hip was performed. No intravenous contrast was administered.  Multiplanar, multisequence MR imaging of the left hip was performed. No intravenous contrast was administered.  COMPARISON:  None Available.  FINDINGS: Bones:  No hip fracture, dislocation or avascular necrosis.  No periosteal reaction or bone destruction. No aggressive osseous lesion.  Normal sacrum and sacroiliac joints. No SI joint widening or erosive changes.  Posterior lumbar interbody fusion at L4-5. Degenerative disease with disc height loss at L5-S1.  Articular cartilage and labrum  Articular cartilage: Partial-thickness cartilage loss of the right femoral head and acetabulum. Partial-thickness cartilage loss of the left femoral head and acetabulum.  Labrum: Grossly intact, but evaluation is limited by lack of intraarticular fluid.  Joint or bursal effusion  Joint effusion:  No hip joint effusion.  No SI joint effusion.  Bursae:  No bursal fluid.  Muscles and tendons  Flexors: Normal.  Extensors: Normal.  Abductors: Normal.  Adductors: Normal.  Gluteals: Normal.  Hamstrings: Normal.  Other findings  No pelvic free fluid. No fluid collection or hematoma. No inguinal lymphadenopathy. No inguinal hernia.  IMPRESSION: 1. Mild-moderate osteoarthritis of bilateral hips. 2. No hip fracture, dislocation or avascular necrosis.   Electronically Signed By: Elige Ko M.D. On: 01/13/2023 12:34  Narrative CLINICAL DATA:  Right hip pain.  No injury.  EXAM: DG HIP (WITH OR WITHOUT PELVIS) 2-3V RIGHT  COMPARISON:  CT abdomen pelvis dated June 28, 2020.  FINDINGS: There is no evidence of hip  fracture or dislocation. There is no evidence of arthropathy or other focal bone abnormality.  IMPRESSION: Negative.   Electronically Signed By: Obie Dredge M.D. On: 03/25/2022 13:55  Hip-L DG 2-3 views: Results for orders placed during the hospital encounter of 03/25/22  DG Hip Unilat With Pelvis 2-3 Views Left  Narrative CLINICAL DATA:  Bilateral hip pain, stabbing sensation.  No injury.  EXAM: DG HIP (WITH OR WITHOUT PELVIS) 2-3V LEFT  COMPARISON:  None Available.  FINDINGS: Hip joints are symmetric.  No degenerative findings.  IMPRESSION: Negative.   Electronically Signed By: Leanna Battles M.D. On: 03/25/2022 13:54  DG Foot Complete Right  Narrative CLINICAL DATA:  Larey Seat 2 months ago hyperflexing great toe, persistent pain at first MTP joint into ball of foot  EXAM: RIGHT FOOT COMPLETE - 3+ VIEW  COMPARISON:  None  FINDINGS: Osseous mineralization probably normal for technique.  Joint spaces preserved.  No acute fracture, dislocation, or bone destruction.  Prior medial malleolar and distal fibular ORIF with non fused ossicle adjacent to tip of lateral malleolus.  IMPRESSION: No acute osseous abnormalities.  Prior bimalleolar ORIF.   Electronically Signed By: Ulyses Southward M.D. On: 10/25/2017 17:16    Complexity Note: Imaging results reviewed.                         ROS  Cardiovascular: Chest pain Pulmonary or Respiratory: Snoring  and Temporary stoppage of breathing during sleep Neurological: Abnormal skin sensations (Peripheral Neuropathy) Psychological-Psychiatric: Anxiousness, Depressed, and Difficulty sleeping and or falling asleep Gastrointestinal: Vomiting blood (Ulcers), Reflux or heatburn, and Irregular, infrequent bowel movements (Constipation) Genitourinary: No reported renal or genitourinary signs or symptoms such as difficulty voiding or producing urine, peeing blood, non-functioning kidney, kidney stones, difficulty  emptying the bladder, difficulty controlling the flow of urine, or chronic kidney disease Hematological: Weakness due to low blood hemoglobin or red  blood cell count (Anemia) and Brusing easily Endocrine: No reported endocrine signs or symptoms such as high or low blood sugar, rapid heart rate due to high thyroid levels, obesity or weight gain due to slow thyroid or thyroid disease Rheumatologic: Joint aches and or swelling due to excess weight (Osteoarthritis), Rheumatoid arthritis, Generalized muscle aches (Fibromyalgia), and Constant unexplained fatigue (Chronic Fatigue Syndrome) Musculoskeletal: Negative for myasthenia gravis, muscular dystrophy, multiple sclerosis or malignant hyperthermia Work History: Disabled  Allergies  Margaret Cuevas is allergic to ibuprofen.  Laboratory Chemistry Profile   Renal Lab Results  Component Value Date   BUN 17 10/28/2022   CREATININE 0.66 10/28/2022   BCR 27 (H) 11/14/2021   GFRAA >60 05/22/2017   GFRNONAA >60 10/28/2022   SPECGRAV 1.015 05/13/2022   PHUR 5.0 05/13/2022   PROTEINUR NEGATIVE 10/28/2022     Electrolytes Lab Results  Component Value Date   NA 138 10/28/2022   K 4.6 10/28/2022   CL 101 10/28/2022   CALCIUM 9.1 10/28/2022   PHOS 4.6 (H) 11/14/2021     Hepatic Lab Results  Component Value Date   AST 19 06/27/2020   ALT 15 06/27/2020   ALBUMIN 4.1 11/14/2021   ALKPHOS 55 06/27/2020   LIPASE 24 06/27/2020     ID Lab Results  Component Value Date   SARSCOV2NAA NEGATIVE 07/08/2022   STAPHAUREUS NEGATIVE 10/28/2022   MRSAPCR NEGATIVE 10/28/2022   PREGTESTUR NEGATIVE 05/22/2017     Bone No results found for: "VD25OH", "VD125OH2TOT", "ON6295MW4", "XL2440NU2", "25OHVITD1", "25OHVITD2", "25OHVITD3", "TESTOFREE", "TESTOSTERONE"   Endocrine Lab Results  Component Value Date   GLUCOSE 96 10/28/2022   GLUCOSEU NEGATIVE 10/28/2022   HGBA1C 6.0 (H) 09/22/2022   TSH 3.360 01/25/2023     Neuropathy Lab Results  Component  Value Date   HGBA1C 6.0 (H) 09/22/2022     CNS No results found for: "COLORCSF", "APPEARCSF", "RBCCOUNTCSF", "WBCCSF", "POLYSCSF", "LYMPHSCSF", "EOSCSF", "PROTEINCSF", "GLUCCSF", "JCVIRUS", "CSFOLI", "IGGCSF", "LABACHR", "ACETBL"   Inflammation (CRP: Acute  ESR: Chronic) Lab Results  Component Value Date   ESRSEDRATE 5 07/03/2016     Rheumatology No results found for: "RF", "ANA", "LABURIC", "URICUR", "LYMEIGGIGMAB", "LYMEABIGMQN", "HLAB27"   Coagulation Lab Results  Component Value Date   PLT 293 10/28/2022     Cardiovascular Lab Results  Component Value Date   BNP 14.0 05/22/2017   HGB 10.4 (L) 10/28/2022   HCT 33.7 (L) 10/28/2022     Screening Lab Results  Component Value Date   SARSCOV2NAA NEGATIVE 07/08/2022   STAPHAUREUS NEGATIVE 10/28/2022   MRSAPCR NEGATIVE 10/28/2022   PREGTESTUR NEGATIVE 05/22/2017     Cancer No results found for: "CEA", "CA125", "LABCA2"   Allergens No results found for: "ALMOND", "APPLE", "ASPARAGUS", "AVOCADO", "BANANA", "BARLEY", "BASIL", "BAYLEAF", "GREENBEAN", "LIMABEAN", "WHITEBEAN", "BEEFIGE", "REDBEET", "BLUEBERRY", "BROCCOLI", "CABBAGE", "MELON", "CARROT", "CASEIN", "CASHEWNUT", "CAULIFLOWER", "CELERY"     Note: Lab results reviewed.  PFSH  Drug: Margaret Cuevas  reports no history of drug use. Alcohol:  reports current alcohol use. Tobacco:  reports that she has quit smoking. Her smoking use included cigarettes. She has been exposed to tobacco smoke. She has never used smokeless tobacco. Medical:  has a past medical history of Anemia, Anginal pain (HCC), Anxiety, Arthritis, Asthma, Chest pain, Complication of anesthesia, Fibromyalgia, GERD (gastroesophageal reflux disease), Hiatal hernia, History of Roux-en-Y gastric bypass (2006), Lipoma of back, Lumbar disc herniation, Lumbar radiculopathy, Obesity, OSA on CPAP, and Pneumonia. Family: family history includes Breast cancer (age of onset: 20) in her cousin; Cancer in her  maternal  grandmother and paternal grandfather; Diabetes in her maternal grandmother and mother; Drug abuse in her sister; Hypertension in her father, maternal grandmother, and paternal grandmother.  Past Surgical History:  Procedure Laterality Date   ABDOMINAL HYSTERECTOMY     ANKLE FRACTURE SURGERY     plates and screws placed on both sides   ANTERIOR LATERAL LUMBAR FUSION WITH PERCUTANEOUS SCREW 1 LEVEL N/A 11/09/2022   Procedure: L4-5 LATERAL LUMBAR INTERBODY FUSION WITH POSTERIOR SPINAL FUSION;  Surgeon: Venetia Night, MD;  Location: [facility] ORS;  Service: Neurosurgery;  Laterality: N/A;   APPENDECTOMY     APPLICATION OF INTRAOPERATIVE CT SCAN N/A 11/09/2022   Procedure: APPLICATION OF INTRAOPERATIVE CT SCAN;  Surgeon: Venetia Night, MD;  Location: [facility] ORS;  Service: Neurosurgery;  Laterality: N/A;   BACK SURGERY     lumbar L4-5   CESAREAN SECTION     CHOLECYSTECTOMY     COLONOSCOPY N/A 05/04/2022   Procedure: COLONOSCOPY;  Surgeon: Jaynie Collins, DO;  Location: [facility] ENDOSCOPY;  Service: Gastroenterology;  Laterality: N/A;   EAR TUBE REMOVAL     ESOPHAGOGASTRODUODENOSCOPY N/A 05/04/2022   Procedure: ESOPHAGOGASTRODUODENOSCOPY (EGD);  Surgeon: Jaynie Collins, DO;  Location: [facility] END[facility]COPY;  Service: Gastroenterology;  Laterality: N/A;   GASTRIC BYPASS  2006   ORIF FINGER / THUMB FRACTURE     screws in thumb   Right RIGHT L4-5 FAR LATERAL DISCECTOMY  08/07/2022   Dr Myer Haff   Active Ambulatory Problems    Diagnosis Date Noted   Influenza vaccine needed 01/20/2017   Fibromyalgia 10/25/2017   Lipoma of back 11/15/2018   H/O total hysterectomy 07/15/2020   History of appendectomy 07/15/2020   OSA on CPAP 05/07/2021   Anxiety, generalized 05/07/2021   Chronic radicular lumbar pain 08/07/2022   S/P lumbar fusion 11/09/2022   Recurrent herniation of lumbar disc 11/09/2022   Spinal stenosis of lumbar region without neurogenic claudication 11/09/2022   Lumbar  post-laminectomy syndrome 02/11/2023   Chronic pain syndrome 02/11/2023   Resolved Ambulatory Problems    Diagnosis Date Noted   No Resolved Ambulatory Problems   Past Medical History:  Diagnosis Date   Anemia    Anginal pain (HCC)    Anxiety    Arthritis    Asthma    Chest pain    Complication of anesthesia    GERD (gastroesophageal reflux disease)    Hiatal hernia    History of Roux-en-Y gastric bypass 2006   Lumbar disc herniation    Lumbar radiculopathy    Obesity    Pneumonia    Constitutional Exam  General appearance: alert, cooperative, and morbidly obese Vitals:   02/11/23 1011  BP: 128/87  Pulse: 83  Resp: 17  Temp: (!) 97.3 F (36.3 C)  TempSrc: Temporal  SpO2: 98%  Weight: 253 lb 12.8 oz (115.1 kg)  Height: 5\' 5"  (1.651 m)   BMI Assessment: Estimated body mass index is 42.23 kg/m as calculated from the following:   Height as of this encounter: 5\' 5"  (1.651 m).   Weight as of this encounter: 253 lb 12.8 oz (115.1 kg).  BMI interpretation table: BMI level Category Range association with higher incidence of chronic pain  <18 kg/m2 Underweight   18.5-24.9 kg/m2 Ideal body weight   25-29.9 kg/m2 Overweight Increased incidence by 20%  30-34.9 kg/m2 Obese (Class I) Increased incidence by 68%  35-39.9 kg/m2 Severe obesity (Class II) Increased incidence by 136%  >40 kg/m2 Extreme obesity (Class III) Increased incidence by 254%  Patient's current BMI Ideal Body weight  Body mass index is 42.23 kg/m. Ideal body weight: 57 kg (125 lb 10.6 oz) Adjusted ideal body weight: 80.2 kg (176 lb 14.7 oz)   BMI Readings from Last 4 Encounters:  02/11/23 42.23 kg/m  01/20/23 41.32 kg/m  12/22/22 41.32 kg/m  11/25/22 42.29 kg/m   Wt Readings from Last 4 Encounters:  02/11/23 253 lb 12.8 oz (115.1 kg)  01/20/23 256 lb (116.1 kg)  12/22/22 256 lb (116.1 kg)  11/25/22 262 lb (118.8 kg)    Psych/Mental status: Alert, oriented x 3 (person, place, & time)        Eyes: PERLA Respiratory: No evidence of acute respiratory distress  Thoracic Spine Area Exam  Skin & Axial Inspection: No masses, redness, or swelling Alignment: Symmetrical Functional ROM: Unrestricted ROM Stability: No instability detected Muscle Tone/Strength: Functionally intact. No obvious neuro-muscular anomalies detected. Sensory (Neurological): Unimpaired Muscle strength & Tone: No palpable anomalies Lumbar Spine Area Exam  Skin & Axial Inspection: Well healed scar from previous spine surgery detected Alignment: Symmetrical Functional ROM: Pain restricted ROM       Stability: No instability detected Muscle Tone/Strength: Functionally intact. No obvious neuro-muscular anomalies detected. Sensory (Neurological): Dermatomal pain pattern right Palpation: Complains of area being tender to palpation       Provocative Tests: Hyperextension/rotation test: Unable to perform due to fusion restriction. Lumbar quadrant test (Kemp's test): (+) on the right for foraminal stenosis Lateral bending test: (+) ipsilateral radicular pain, on the right. Positive for right-sided foraminal stenosis. Patrick's Maneuver: (+) for bilateral S-I arthralgia              Gait & Posture Assessment  Ambulation: Patient ambulates using a walker Gait: Antalgic gait (limping) Posture: Difficulty standing up straight, due to pain  Lower Extremity Exam    Side: Right lower extremity  Side: Left lower extremity  Stability: No instability observed          Stability: No instability observed          Skin & Extremity Inspection: Skin color, temperature, and hair growth are WNL. No peripheral edema or cyanosis. No masses, redness, swelling, asymmetry, or associated skin lesions. No contractures.  Skin & Extremity Inspection: Skin color, temperature, and hair growth are WNL. No peripheral edema or cyanosis. No masses, redness, swelling, asymmetry, or associated skin lesions. No contractures.  Functional ROM: Pain  restricted ROM for hip and knee joints          Functional ROM: Unrestricted ROM                  Muscle Tone/Strength: Functionally intact. No obvious neuro-muscular anomalies detected.  Muscle Tone/Strength: Functionally intact. No obvious neuro-muscular anomalies detected.  Sensory (Neurological): Neurogenic pain pattern        Sensory (Neurological): Unimpaired        DTR: Patellar: deferred today Achilles: deferred today Plantar: deferred today  DTR: Patellar: deferred today Achilles: deferred today Plantar: deferred today  Palpation: No palpable anomalies  Palpation: No palpable anomalies    Assessment  Primary Diagnosis & Pertinent Problem List: The primary encounter diagnosis was Failed back surgical syndrome. Diagnoses of Lumbar post-laminectomy syndrome, Chronic radicular lumbar pain, SI joint arthritis (HCC), Sacroiliac joint pain, Chronic pain syndrome, and Fibromyalgia were also pertinent to this visit.  Visit Diagnosis (New problems to examiner): 1. Failed back surgical syndrome   2. Lumbar post-laminectomy syndrome   3. Chronic radicular lumbar pain   4. SI joint arthritis (  HCC)   5. Sacroiliac joint pain   6. Chronic pain syndrome   7. Fibromyalgia    Plan of Care (Initial workup plan)  I discussed  percutaneous spinal cord stimulator trial with the patient in detail.  I explained to pt how a spinal cord stimulation (SCS) is indicated for chronic, intractable pain, especially in cases of failed back surgery syndrome, radiculopathy, or pain syndromes that affect broader areas such as the back and legs.  SCS devices provide continuous electrical impulses to the spinal cord, modulating pain signals before they reach the brain.  We discussed the potential benefits and risks of spinal cord stimulation. Benefits: can provide substantial relief from chronic pain, long-term therapy with programmable settings, often reduces the need for oral medications, including opioids, some  patients experience reduced dependency on other pain interventions. Risks (including but not limited to): Surgical risks, including infection, lead migration/fracture, and hardware malfunction, long-term implantation requires ongoing maintenance and possible reoperation, variable effectiveness: some patients do not experience sufficient pain relief.    I explained to the patient that they will have an external power source and programmer which the patient will use for 7 days. There will be daily communication with the stimulator company and the patient. A possible need for a mid-trial clinic visit to give the patient the best chance of success.  I was able to evaluate her interlaminar windows under live fluoroscopy and they appear patent for percutaneous access  Patient will need to have a thorough psychosocial behavioral evaluation.  She is scheduled for 1 later this month  Some of patient's pain does seem to be mechanical in nature, with some component of neurogenic pain as well. We discussed the indications for spinal cord stimulation, specifically stating that it is typically better for neuropathic and appendicular pain, but that we have had some success in the treatment of low back and hip pain.   Patient is interested in proceeding with spinal cord stimulation trial. She understands that this may not be successful, and that spinal cord stimulation in general is not a "magic bullet."   We had a lengthy and very detailed discussion of all the risks, benefits, alternatives, and rationale of surgery as well as the option of continuing nonsurgical therapies. We specifically discussed the risks of temporary or permanent worsened neurologic injury, no symptomatic relief or pain made worse after procedure, and also the need for future surgery (due to infection, CSF leak, bleeding, adjacent segment issues, bone-healing difficulties, and other related issues). No guarantees of outcome were made or implied  and he is eager to proceed and presents for definitive treatment.   She told me that all of her questions were answered thoroughly and to her  satisfaction. Confidence and understanding of the discussed risks and consequences of  treatment was expressed and she accepted these risks and was eager to proceed with procedure.   Issues concerning treatment and diagnosis were discussed with the patient. There are no barriers to understanding the plan of treatment. Explanation was well received by patient and/or family who then verbalized understanding.   We discussed NEVRO SCS trial.   Imaging Orders         DG PAIN CLINIC C-ARM 1-60 MIN NO REPORT     Procedure Orders         [place] TRIAL     Provider-requested follow-up: Return in about 27 days (around 03/10/2023) for Nevro SCS trial, ECT.  Future Appointments  Date Time Provider Department Center  02/15/2023  9:20 AM [name], MD [facility]  02/17/2023 10:00 AM Jennelle Human Sheppard Plumber, [facility] None  03/24/2023 11:30 AM Neysa Hotter, MD [facility] None  04/27/2023  9:30 AM Venetia Night, MD [facility] None    Duration of encounter: .  Total time on encounter, as per AMA guidelines included both the face-to-face and non-face-to-face time personally spent by the physician and/or other qualified health care professional(s) on the day of the encounter (includes time in activities that require the physician or other qualified health care professional and does not include time in activities normally performed by clinical staff). Physician's time may include the following activities when performed: Preparing to see the patient (e.g., pre-charting review of records, searching for previously ordered imaging, lab work, and nerve conduction tests) Review of prior analgesic pharmacotherapies. Reviewing PMP Interpreting ordered tests (e.g., lab work, imaging, nerve conduction tests) Performing post-procedure evaluations, including  interpretation of diagnostic procedures Obtaining and/or reviewing separately obtained history Performing a medically appropriate examination and/or evaluation Counseling and educating the patient/family/caregiver Ordering medications, tests, or procedures Referring and communicating with other health care professionals (when not separately reported) Documenting clinical information in the electronic or other health record Independently interpreting results (not separately reported) and communicating results to the patient/ family/caregiver Care coordination (not separately reported)  Note by: Edward Jolly, MD (TTS technology used. I apologize for any typographical errors that were not detected and corrected.) Date: 02/11/2023; Time: 11:08 AM

## 2023-02-11 NOTE — Progress Notes (Signed)
Safety precautions to be maintained throughout the outpatient stay will include: orient to surroundings, keep bed in low position, maintain call bell within reach at all times, provide assistance with transfer out of bed and ambulation.  

## 2023-02-11 NOTE — Patient Instructions (Addendum)
Pain Management  Fax#  [phone]  Spinal Cord Stimulation Trial Information A spinal cord stimulation trial is a test to see whether a spinal cord stimulator reduces your pain. A spinal cord stimulator is a small device that is inserted (implanted) in your back. The stimulator has small wires (leads) that connect it to your spinal cord. The stimulator sends electrical pulses through the leads to the spinal cord. This can relieve pain. Settings for the stimulator can be adjusted with a remote device to find the best pain control. Your health care provider may suggest a spinal cord stimulation trial if other treatments for long-lasting (chronic) pain have not worked for you. Spinal cord stimulation may be used to manage pain that is caused by: Failed back surgery. Heart pain (angina) or a blood vessel problem (peripheral vascular disease). Pain after amputation (phantom limb sensation). Nerve-related pain. Complex regional pain syndrome. Painful inflammation of a thin membrane that covers the brain and spinal cord (arachnoiditis). Other syndromes that involve chronic pain or injuries related to the spinal cord. For the trial, the stimulator is attached to your back instead of inserted under the skin. A trial period is usually 3-5 days, but this can vary among health care providers. After your trial period, you and your health care provider will discuss whether a permanent spinal cord stimulator is an option for you. The permanent stimulator may be an option depending on: Whether the stimulator reduces your pain during the trial. Whether the stimulator fits into your lifestyle. Whether the cost of the stimulator is covered by your insurance. What are the risks? Generally, a spinal cord stimulation trial is safe. However, problems can occur, including: Bleeding or pain at the insertion site of the leads. Infection at the insertion site or around the leads. Allergic reactions to medicines,  devices, or dyes. Damage to the skin, nerves, back muscles, or spinal cord where the leads are placed. Inability to move the legs (paralysis). Numbness in the legs. Inability to control when you urinate or have a bowel movement (incontinence). Spinal fluid leakage. How is a spinal cord stimulator placed for a trial?  For a trial period, the stimulator generator and battery will be outside the body, typically on a belt that you will wear around your waist. Only the leads that connect the stimulator to the spinal cord are implanted under your skin. The exact location of the stimulator depends on where you have pain. There are two types of surgery for implanting the leads: Noninvasive surgery. In this type of surgery, a small incision is made and needles are used to place the leads under your skin. Open surgery. In this type of surgery, a larger incision is made, and the leads are implanted directly into your back. How to care for yourself during the trial period Incision care Check your incisions every day for signs of infection. Check for: Redness, swelling, or pain. Fluid or blood. Warmth. Pus or a bad smell. Activity Return to your normal activities as told by your health care provider. Ask your health care provider what activities are safe for you. You may have to avoid lifting. Ask your health care provider how much you can safely lift. General instructions Follow your health care provider's instructions about how to take care of your spinal cord stimulator and your incision. Make sure you write down the following information so that you can share this information with your health care provider: Your responses to the stimulator. Describe these as told by  your health care provider. Your pain level throughout the day. The amount and kind of pain medicine that you take. Do not take baths, swim, or use a hot tub until your health care provider approves. Ask your health care provider if you  may take showers. You may only be allowed to take sponge baths. Take over-the-counter and prescription medicines only as told by your health care provider. Tell all health care providers who provide care for you that you have a spinal cord stimulator. This is important information that could affect the medical treatment that you receive. Keep all follow-up visits. This is important. Contact a health care provider if: You have any of these signs of infection: Redness, swelling, or pain around your incision. Fluid or blood coming from your incision. Warmth coming from your incision. Pus or a bad smell coming from your incision. A fever. Get help right away if: Your pain gets worse. The stimulator leads come out. You develop numbness or weakness in your legs, or you have difficulty walking. You have problems urinating or having a bowel movement. You have symptoms that last for more than 2-3 days or your symptoms suddenly get worse. These symptoms may be an emergency. Get help right away. Call 911. Do not wait to see if the symptoms will go away. Do not drive yourself to the hospital. Summary A spinal cord stimulator is a small device that sends electrical pulses to your spinal cord. This can relieve pain caused by many different health conditions. Before a permanent stimulator is placed, you will have a trial using a temporary stimulator that is not implanted under your skin. This helps determine if a stimulator will reduce your pain. For the trial, only the leads that connect the stimulator to the spinal cord are implanted under your skin. During the trial period, make sure you write down information about your pain and your responses to the stimulator so that you can share this information with your health care provider. Keep all follow-up visits. This is important. Contact your health care provider if you have problems during the trial. This information is not intended to replace advice  given to you by your health care provider. Make sure you discuss any questions you have with your health care provider. Document Revised: 01/14/2021 Document Reviewed: 01/14/2021 Elsevier Patient Education  2024 [employer].

## 2023-02-15 ENCOUNTER — Ambulatory Visit: Payer: No Typology Code available for payment source | Admitting: Family Medicine

## 2023-02-16 ENCOUNTER — Encounter: Payer: Self-pay | Admitting: Orthopedic Surgery

## 2023-02-16 MED ORDER — ESTRADIOL 0.1 MG/24HR TD PTTW
1.0000 | MEDICATED_PATCH | TRANSDERMAL | 0 refills | Status: DC
Start: 2023-02-18 — End: 2024-02-22

## 2023-02-16 NOTE — Telephone Encounter (Signed)
Lumbar xrays dated 01/27/23:  FINDINGS: Posterior fusion and interbody disc spacer at L4-5. Stable hardware and alignment. Preserved vertebral body heights. Scattered minor degenerative changes throughout the lumbar spine with slight disc space narrowing at L3-4 and L5-S1. No acute osseous finding or compression fracture. SI joints are maintained.   IMPRESSION: Stable L4-5 posterior fusion. No acute finding by plain radiography.     Electronically Signed   By: Judie Petit.  Shick M.D.   On: 02/15/2023 21:18   I have personally reviewed the images and agree with the above interpretation.

## 2023-02-17 ENCOUNTER — Ambulatory Visit: Payer: No Typology Code available for payment source | Admitting: Psychology

## 2023-02-17 DIAGNOSIS — F4323 Adjustment disorder with mixed anxiety and depressed mood: Secondary | ICD-10-CM | POA: Diagnosis not present

## 2023-02-17 NOTE — Progress Notes (Signed)
[facility] Counselor/Therapist Progress Note  Patient ID: Laverle Farver, MRN: [id number],    Date: 02/17/2023  Time Spent: 45 mins start time: 1000    end time: 1045  Treatment Type: Individual Therapy  Reported Symptoms: Pt presents for session, via Caregility video, granting consent for the session.  Pt states she is in her home with no one else present; she also shares she understands the limits of virtual sessions.  I shared with pt that I am in my office with no one else here.  Mental Status Exam: Appearance:  Casual     Behavior: Appropriate  Motor: Normal  Speech/Language:  Clear and Coherent  Affect: Appropriate  Mood: normal  Thought process: normal  Thought content:   WNL  Sensory/Perceptual disturbances:   WNL  Orientation: oriented to person, place, and time/date  Attention: Good  Concentration: Good  Memory: WNL  Fund of knowledge:  Good  Insight:   Good  Judgment:  Good  Impulse Control: Good   Risk Assessment: Danger to Self:  No Self-injurious Behavior: No Danger to Others: No Duty to Warn:no Physical Aggression / Violence:No  Access to Firearms a concern: No  Gang Involvement:No   Subjective: Pt shares that she "has been doing OK I guess.  I am frustrated beyond belief; I have to see a psychologist in order to get the spinal cord stimulator and the first appt I could get was 10/31; then they have to wait for the report and review that and then schedule the appt for the procedure.  The whole thing is frustrating."  Talked with pt about why that is done and that it is not aimed at her; it is for her benefit.  Pt shares that her LTD has been approved; she has not yet gotten her first payment but she is hopeful that it will come soon.  She will be getting 60% of her income through her LTD.  Pt shares that she did not sleep well last night, due to the pain in her back and that is frustrating for her.  Pt shares she did have her psychologist appt for  the gastric bypass revision and it was at least an hour and a half.  Pt shares she was cleared to have the surgery.  The gastric bypass surgeon wants to wait to do the gastric bypass surgery until after she has the spinal cord stimulator implanted.  Pt shares that she has been watching TV as a self care activity; she is also working on cooking dinners for her family; she has also been working on Armed forces logistics/support/administrative officer puzzles as well.  Pt shares that she has started her aquatic therapy and it went well for her; she likes the therapist who is working with her.  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  She may have to have her right hip replaced in the next year or two.  Pt shares she continues to take her Cymbalta 90 mg daily and feels like it is helpful.  Pt had her original gastric bypass surgery on 06/11/2003.  Pt shares her mom and sister have been to visit her; her son also came to visit her last Sunday and she enjoyed that visit.  Encouraged pt to remember to use her deep breathing to help her relax and to breathe through  her pain episodes.  Encouraged pt to continue with her self care activities and we will meet in 2 wks for a follow up session.  Interventions: Cognitive Behavioral Therapy  Diagnosis:Adjustment disorder with mixed anxiety and depressed mood  Plan: Treatment Plan Strengths/Abilities:  Intelligent, Intuitive, Willing to participate in therapy Treatment Preferences:  Outpatient Individual Therapy Statement of Needs:  Patient is to use CBT, mindfulness and coping skills to help manage and/or decrease symptoms associated with their diagnosis. Symptoms:  Depressed/Irritable mood, worry, social withdrawal Problems Addressed:  Depressive thoughts, Sadness, Sleep issues, etc. Long Term Goals:  Pt to reduce overall level, frequency, and  intensity of the feelings of depression/anxiety as evidenced by decreased irritability, negative self talk, and helpless feelings from 6 to 7 days/week to 0 to 1 days/week, per client report, for at least 3 consecutive months.  Progress:  Short Term Goals:  Pt to verbally express understanding of the relationship between feelings of depression/anxiety and their impact on thinking patterns and behaviors.  Pt to verbalize an understanding of the role that distorted thinking plays in creating fears, excessive worry, and ruminations.  Progress:  Target Date:  12/23/2023 Frequency:  Bi-weekly Modality:  Cognitive Behavioral Therapy Interventions by Therapist:  Therapist will use CBT, Mindfulness exercises, Coping skills and Referrals, as needed by client. Client has verbally approved this treatment plan.  Karie Kirks, [facility]

## 2023-02-21 DIAGNOSIS — G4733 Obstructive sleep apnea (adult) (pediatric): Secondary | ICD-10-CM | POA: Diagnosis not present

## 2023-02-23 ENCOUNTER — Ambulatory Visit: Payer: No Typology Code available for payment source | Admitting: Family Medicine

## 2023-02-23 ENCOUNTER — Telehealth: Payer: Self-pay

## 2023-02-23 ENCOUNTER — Telehealth: Payer: Self-pay | Admitting: Family Medicine

## 2023-02-23 ENCOUNTER — Telehealth: Payer: Self-pay | Admitting: Psychiatry

## 2023-02-23 NOTE — Telephone Encounter (Signed)
I received a form from [employer] regarding long-term disability.  Please contact the patient, as my understanding is that she has been on leave due to her issues related to pain. Since we haven't discussed this yet and I've only seen her twice, I won't be able to provide any forms at this time.

## 2023-02-23 NOTE — Telephone Encounter (Signed)
Called pt left VM to call back. We received paper work for long term disability.Pt would need to get these forms filed out from pain management or her ortho provider.  KP

## 2023-02-23 NOTE — Telephone Encounter (Signed)
Copied from CRM #[id number]. Topic: General - Inquiry >> Feb 23, 2023  1:07 PM De Blanch wrote: Reason for CRM:Pt called to reschedule today's appointment; she stated she is going through a lot, having a lot of procedures, and is not feeling up to it. She mentioned having two tooth extractions as well yesterday.  Pt is asking for the fee for canceling same day to be waived. Please advise.

## 2023-02-23 NOTE — Telephone Encounter (Signed)
The form requires all sections to be completed, including my support for her disability claim. While I can provide a letter confirming her visits on specific dates if she wishes, I'm unable to fill out the form to support her at this time.

## 2023-02-23 NOTE — Telephone Encounter (Signed)
pt called back. she was expalined issue with the papework. she stated that when she completed the paperwork she had to list all her providers and that maybe why they sent the paperwork. she asked if you could just put the dates that she was seen

## 2023-02-23 NOTE — Telephone Encounter (Signed)
i have tried to call patient twice today and there is no answer and mailbox is full

## 2023-02-24 ENCOUNTER — Encounter: Payer: Self-pay | Admitting: Orthopedic Surgery

## 2023-02-24 NOTE — Progress Notes (Signed)
Thoracic MRI scan dated 01/29/23:  EXAM: MRI THORACIC SPINE WITHOUT CONTRAST   TECHNIQUE: Multiplanar, multisequence MR imaging of the thoracic spine was performed. No intravenous contrast was administered.   COMPARISON:  None Available.   FINDINGS: Alignment: Mild scoliotic curvature and straightening of lower thoracic kyphosis.   Vertebrae: No fracture, evidence of discitis, or bone lesion.   Cord:  Normal signal and morphology.   Paraspinal and other soft tissues: No perispinal mass or inflammation is seen.   Notable degenerative levels:   Disc desiccation and narrowing with right paracentral protrusion at T5-6 to T9-10. Mild ligamentum flavum thickening at T9-10 and T10-11 but no cord impingement. The spinal canal is diffusely patent.   IMPRESSION: 1. Disc degeneration with right paracentral herniations at T5-6 to T9-10. 2. Straightening of lower thoracic kyphosis with slight ligamentum flavum thickening. Relatively narrow posterior subarachnoid space at T9-10 and T10-11 but the spinal canal is diffusely patent.     Electronically Signed   By: Tiburcio Pea M.D.   On: 02/19/2023 07:52       I have personally reviewed the images and agree with the above interpretation.  Above imaging reviewed with Dr. Myer Haff. Okay to proceed with SCS trial. Patient sent a message.

## 2023-02-26 ENCOUNTER — Other Ambulatory Visit: Payer: Self-pay | Admitting: Orthopedic Surgery

## 2023-02-26 ENCOUNTER — Ambulatory Visit (INDEPENDENT_AMBULATORY_CARE_PROVIDER_SITE_OTHER): Payer: No Typology Code available for payment source | Admitting: Family Medicine

## 2023-02-26 ENCOUNTER — Encounter: Payer: Self-pay | Admitting: Family Medicine

## 2023-02-26 VITALS — BP 124/82 | HR 82 | Ht 66.0 in | Wt 258.0 lb

## 2023-02-26 DIAGNOSIS — J0100 Acute maxillary sinusitis, unspecified: Secondary | ICD-10-CM | POA: Diagnosis not present

## 2023-02-26 DIAGNOSIS — Z23 Encounter for immunization: Secondary | ICD-10-CM

## 2023-02-26 DIAGNOSIS — Z981 Arthrodesis status: Secondary | ICD-10-CM

## 2023-02-26 DIAGNOSIS — F419 Anxiety disorder, unspecified: Secondary | ICD-10-CM

## 2023-02-26 MED ORDER — OXYCODONE HCL 5 MG PO TABS
5.0000 mg | ORAL_TABLET | Freq: Three times a day (TID) | ORAL | 0 refills | Status: DC | PRN
Start: 2023-02-26 — End: 2023-03-12

## 2023-02-26 MED ORDER — AMOXICILLIN 500 MG PO CAPS
500.0000 mg | ORAL_CAPSULE | Freq: Three times a day (TID) | ORAL | 0 refills | Status: AC
Start: 2023-02-26 — End: 2023-03-08

## 2023-02-26 MED ORDER — TRIAMCINOLONE ACETONIDE 55 MCG/ACT NA AERO
2.0000 | INHALATION_SPRAY | Freq: Every day | NASAL | 12 refills | Status: DC
Start: 2023-02-26 — End: 2023-03-05

## 2023-02-26 MED ORDER — BUSPIRONE HCL 7.5 MG PO TABS
7.5000 mg | ORAL_TABLET | Freq: Two times a day (BID) | ORAL | 1 refills | Status: DC
Start: 2023-02-26 — End: 2023-10-01

## 2023-02-26 NOTE — Telephone Encounter (Signed)
DOS: 11/09/22 XLIF L4-5 XLIF and posterior spinal fusion, right L5-S1 far lateral foraminotomy    Surgery: 08/07/2022 (R L4/5 far lateral discectomy)   PMP reviewed and is appropriate. Refill of oxycodone sent to pharmacy. Please let her know.

## 2023-02-26 NOTE — Progress Notes (Signed)
Date:  02/26/2023   Name:  Margaret Cuevas   DOB:  07-17-1975   MRN:  [id number]   Chief Complaint: Anxiety and Sinus Problem (X3 days,Sinus pressure and burns, knot on left check, post nasal drip )  Anxiety Presents for follow-up visit. Symptoms include excessive worry, irritability, nervous/anxious behavior, restlessness and shortness of breath. Patient reports no chest pain, palpitations or suicidal ideas.    Sinus Problem This is a new problem. The current episode started in the past 7 days. The pain is moderate. Associated symptoms include congestion, headaches, a hoarse voice, shortness of breath and sinus pressure. Pertinent negatives include no coughing, ear pain, sneezing or sore throat. The treatment provided mild relief.    Lab Results  Component Value Date   NA 138 10/28/2022   K 4.6 10/28/2022   CO2 28 10/28/2022   GLUCOSE 96 10/28/2022   BUN 17 10/28/2022   CREATININE 0.66 10/28/2022   CALCIUM 9.1 10/28/2022   EGFR 101 11/14/2021   GFRNONAA >60 10/28/2022   Lab Results  Component Value Date   CHOL 191 11/14/2021   HDL 79 11/14/2021   LDLCALC 99 11/14/2021   TRIG 71 11/14/2021   Lab Results  Component Value Date   TSH 3.360 01/25/2023   Lab Results  Component Value Date   HGBA1C 6.0 (H) 09/22/2022   Lab Results  Component Value Date   WBC 5.0 10/28/2022   HGB 10.4 (L) 10/28/2022   HCT 33.7 (L) 10/28/2022   MCV 90.6 10/28/2022   PLT 293 10/28/2022   Lab Results  Component Value Date   ALT 15 06/27/2020   AST 19 06/27/2020   ALKPHOS 55 06/27/2020   BILITOT 0.5 06/27/2020   No results found for: "25OHVITD2", "25OHVITD3", "VD25OH"   Review of Systems  Constitutional:  Positive for irritability.  HENT:  Positive for congestion, hoarse voice, postnasal drip, rhinorrhea, sinus pressure and sinus pain. Negative for ear pain, nosebleeds, sneezing and sore throat.   Respiratory:  Positive for shortness of breath. Negative for cough, chest tightness and  wheezing.   Cardiovascular:  Negative for chest pain, palpitations and leg swelling.  Neurological:  Positive for headaches.  Psychiatric/Behavioral:  Negative for suicidal ideas. The patient is nervous/anxious.     Patient Active Problem List   Diagnosis Date Noted   Lumbar post-laminectomy syndrome 02/11/2023   Chronic pain syndrome 02/11/2023   S/P lumbar fusion 11/09/2022   Recurrent herniation of lumbar disc 11/09/2022   Spinal stenosis of lumbar region without neurogenic claudication 11/09/2022   Chronic radicular lumbar pain 08/07/2022   OSA on CPAP 05/07/2021   Anxiety, generalized 05/07/2021   H/O total hysterectomy 07/15/2020   History of appendectomy 07/15/2020   Lipoma of back 11/15/2018   Fibromyalgia 10/25/2017   Influenza vaccine needed 01/20/2017    Allergies  Allergen Reactions   Ibuprofen Other (See Comments)    Motrin makes her throat swell. Can take ibuprofen    Past Surgical History:  Procedure Laterality Date   ABDOMINAL HYSTERECTOMY     ANKLE FRACTURE SURGERY     plates and screws placed on both sides   ANTERIOR LATERAL LUMBAR FUSION WITH PERCUTANEOUS SCREW 1 LEVEL N/A 11/09/2022   Procedure: L4-5 LATERAL LUMBAR INTERBODY FUSION WITH POSTERIOR SPINAL FUSION;  Surgeon: Venetia Night, MD;  Location: [facility] ORS;  Service: Neurosurgery;  Laterality: N/A;   APPENDECTOMY     APPLICATION OF INTRAOPERATIVE CT SCAN N/A 11/09/2022   Procedure: APPLICATION OF INTRAOPERATIVE CT SCAN;  Surgeon: Venetia Night, MD;  Location: [facility] ORS;  Service: Neurosurgery;  Laterality: N/A;   BACK SURGERY     lumbar L4-5   CESAREAN SECTION     CHOLECYSTECTOMY     COLONOSCOPY N/A 05/04/2022   Procedure: COLONOSCOPY;  Surgeon: Jaynie Collins, DO;  Location: [facility];  Service: Gastroenterology;  Laterality: N/A;   EAR TUBE REMOVAL     ESOPHAGOGASTRODUODENOSCOPY N/A 05/04/2022   Procedure: ESOPHAGOGASTRODUODENOSCOPY (EGD);  Surgeon: Jaynie Collins, DO;   Location: [facility];  Service: Gastroenterology;  Laterality: N/A;   GASTRIC BYPASS  2006   ORIF FINGER / THUMB FRACTURE     screws in thumb   Right RIGHT L4-5 FAR LATERAL DISCECTOMY  08/07/2022   Dr Myer Haff    Social History   Tobacco Use   Smoking status: Former    Types: Cigarettes    Passive exposure: Past   Smokeless tobacco: Never   Tobacco comments:    social  Advertising account planner   Vaping status: Never Used  Substance Use Topics   Alcohol use: Yes    Comment: social   Drug use: No     Medication list has been reviewed and updated.  Current Meds  Medication Sig   acetaminophen (TYLENOL) 650 MG CR tablet Take 1,300 mg by mouth every 8 (eight) hours as needed for pain.   busPIRone (BUSPAR) 7.5 MG tablet TAKE 1 TABLET(7.5 MG) BY MOUTH TWICE DAILY   Calcium Citrate-Vitamin D 315-5 MG-MCG TABS Take by mouth.   celecoxib (CELEBREX) 100 MG capsule Take 1 capsule by mouth 2 (two) times daily. Rheum   cyclobenzaprine (FLEXERIL) 10 MG tablet TAKE 1 TABLET(10 MG) BY MOUTH THREE TIMES DAILY AS NEEDED FOR MUSCLE SPASMS   DULoxetine (CYMBALTA) 30 MG capsule Take 1 capsule (30 mg total) by mouth daily. Total of 90 mg daily, take along with 60 mg cap   DULoxetine (CYMBALTA) 60 MG capsule Take 60 mg by mouth every morning. And then an additional if needed   estradiol (VIVELLE-DOT) 0.1 MG/24HR patch Place 1 patch (0.1 mg total) onto the skin 2 (two) times a week. Dr Feliberto Gottron   gabapentin (NEURONTIN) 600 MG tablet Take 2 tablets (1,200 mg total) by mouth 3 (three) times daily.   Multiple Vitamin (MULTI-VITAMIN) tablet Take 1 tablet by mouth daily.   omeprazole (PRILOSEC) 40 MG capsule Take 40 mg by mouth daily.   oxyCODONE (ROXICODONE) 5 MG immediate release tablet Take 1 tablet (5 mg total) by mouth every 8 (eight) hours as needed for severe pain (pain score 7-10).   senna (SENOKOT) 8.6 MG TABS tablet Take 1 tablet (8.6 mg total) by mouth 2 (two) times daily.   sucralfate (CARAFATE)  1 g tablet Take 1 g by mouth 4 (four) times daily.       02/26/2023    9:45 AM 01/25/2023   11:34 AM 09/22/2022    9:04 AM 05/13/2022    8:07 AM  GAD 7 : Generalized Anxiety Score  Nervous, Anxious, on Edge 1  0 0  Control/stop worrying 1  0 0  Worry too much - different things 1  0 0  Trouble relaxing 2  0 0  Restless 1  0 0  Easily annoyed or irritable 1  0 0  Afraid - awful might happen 1  0 0  Total GAD 7 Score 8  0 0  Anxiety Difficulty Somewhat difficult  Not difficult at all Not difficult at all     Information is  confidential and restricted. Go to Review Flowsheets to unlock data.       02/26/2023    9:45 AM 01/25/2023   11:18 AM 09/22/2022    9:03 AM  Depression screen PHQ 2/9  Decreased Interest 2  0  Down, Depressed, Hopeless 2  0  PHQ - 2 Score 4  0  Altered sleeping 1  0  Tired, decreased energy 2  0  Change in appetite 0  0  Feeling bad or failure about yourself  2  0  Trouble concentrating 2  0  Moving slowly or fidgety/restless 2  0  Suicidal thoughts 0  0  PHQ-9 Score 13  0  Difficult doing work/chores Somewhat difficult  Not difficult at all     Information is confidential and restricted. Go to Review Flowsheets to unlock data.    BP Readings from Last 3 Encounters:  02/26/23 124/82  02/11/23 128/87  01/20/23 132/76    Physical Exam Vitals and nursing note reviewed. Exam conducted with a chaperone present.  Constitutional:      General: She is not in acute distress.    Appearance: She is not diaphoretic.  HENT:     Head: Normocephalic and atraumatic.     Salivary Glands: Right salivary gland is not diffusely enlarged or tender. Left salivary gland is not diffusely enlarged or tender.     Right Ear: External ear normal.     Left Ear: External ear normal.     Nose: Nose normal.     Right Turbinates: Swollen.     Left Turbinates: Swollen.     Right Sinus: No maxillary sinus tenderness or frontal sinus tenderness.     Left Sinus: No maxillary  sinus tenderness or frontal sinus tenderness.     Mouth/Throat:     Palate: No mass.     Pharynx: Oropharynx is clear.  Eyes:     General:        Right eye: No discharge.        Left eye: No discharge.     Conjunctiva/sclera: Conjunctivae normal.     Pupils: Pupils are equal, round, and reactive to light.  Neck:     Thyroid: No thyromegaly.     Vascular: No JVD.  Cardiovascular:     Rate and Rhythm: Normal rate and regular rhythm.     Heart sounds: Normal heart sounds. No murmur heard.    No friction rub. No gallop.  Pulmonary:     Effort: Pulmonary effort is normal.     Breath sounds: Normal breath sounds.  Abdominal:     General: Bowel sounds are normal.     Palpations: Abdomen is soft. There is no mass.     Tenderness: There is no abdominal tenderness. There is no guarding.  Musculoskeletal:        General: Normal range of motion.     Cervical back: Normal range of motion and neck supple.  Lymphadenopathy:     Cervical: No cervical adenopathy.  Skin:    General: Skin is warm and dry.  Neurological:     Mental Status: She is alert.     Wt Readings from Last 3 Encounters:  02/26/23 258 lb (117 kg)  02/11/23 253 lb 12.8 oz (115.1 kg)  01/20/23 256 lb (116.1 kg)    BP 124/82   Pulse 82   Ht 5\' 6"  (1.676 m)   Wt 258 lb (117 kg)   SpO2 94%   BMI 41.64 kg/m  Assessment and Plan:  1. Anxiety Chronic.  Controlled.  Stable.  PHQ is 13 GAD score is 8 patient is followed by psychiatry and has had a recent adjustment to her duloxetine.  We will continue buspirone at current dosing of 7.5 twice a day and will recheck patient in 6 months. - busPIRone (BUSPAR) 7.5 MG tablet; Take 1 tablet (7.5 mg total) by mouth 2 (two) times daily.  Dispense: 180 tablet; Refill: 1  2. Acute maxillary sinusitis, recurrence not specified New problem.  Symptomatic and swelling noted of the turbinates suggesting that patient has nasal swelling and resultant maxillary sinus discomfort.  There  is no palpable masses or tenderness below the left zygomatic process.  We will initiate triamcinolone nasal inhaler 2 sprays in nostril daily and will treat for an early sinus infection with amoxicillin 500 mg 3 times a day for 10 days. - triamcinolone (NASACORT) 55 MCG/ACT AERO nasal inhaler; Place 2 sprays into the nose daily.  Dispense: 1 each; Refill: 12 - amoxicillin (AMOXIL) 500 MG capsule; Take 1 capsule (500 mg total) by mouth 3 (three) times daily for 10 days.  Dispense: 30 capsule; Refill: 0  3. Need for influenza vaccination Discussed and administered   Elizabeth Sauer, MD

## 2023-02-26 NOTE — Telephone Encounter (Signed)
Patient aware of refill.

## 2023-02-27 ENCOUNTER — Encounter: Payer: Self-pay | Admitting: Student in an Organized Health Care Education/Training Program

## 2023-03-01 ENCOUNTER — Other Ambulatory Visit: Payer: Self-pay | Admitting: Neurosurgery

## 2023-03-01 MED ORDER — GABAPENTIN 600 MG PO TABS: 1200.0000 mg | ORAL_TABLET | Freq: Three times a day (TID) | ORAL | 1 refills | Status: DC

## 2023-03-01 NOTE — Telephone Encounter (Signed)
Patient notified

## 2023-03-01 NOTE — Telephone Encounter (Signed)
Dr. Myer Haff has prescribed her neurontin. She is taking 600mg  - taking 2 pills tid.   Refill sent to pharmacy.   Please let her know.

## 2023-03-03 ENCOUNTER — Telehealth: Payer: Self-pay | Admitting: Family Medicine

## 2023-03-03 NOTE — Telephone Encounter (Signed)
Copied from CRM [id number]. Topic: General - Other >> Mar 03, 2023  2:28 PM Santiya F wrote: Reason for CRM: [place] insurance is calling in because they sent a request for medical records on 02/23/23 and haven't heard anything regarding it. They want to know if the request was received.

## 2023-03-04 ENCOUNTER — Encounter: Payer: Self-pay | Admitting: Family Medicine

## 2023-03-04 ENCOUNTER — Ambulatory Visit: Payer: No Typology Code available for payment source | Admitting: Psychology

## 2023-03-04 DIAGNOSIS — F4323 Adjustment disorder with mixed anxiety and depressed mood: Secondary | ICD-10-CM

## 2023-03-04 NOTE — Progress Notes (Signed)
[facility] Counselor/Therapist Progress Note  Patient ID: Margaret Cuevas, MRN: [id number],    Date: 03/04/2023  Time Spent: 45 mins start time: 1000    end time: 1045  Treatment Type: Individual Therapy  Reported Symptoms: Pt presents for session, via Caregility video, granting consent for the session.  Pt states she is in her home with no one else present; she also shares she understands the limits of virtual sessions.  I shared with pt that I am in my office with no one else here.  Mental Status Exam: Appearance:  Casual     Behavior: Appropriate  Motor: Normal  Speech/Language:  Clear and Coherent  Affect: Appropriate  Mood: normal  Thought process: normal  Thought content:   WNL  Sensory/Perceptual disturbances:   WNL  Orientation: oriented to person, place, and time/date  Attention: Good  Concentration: Good  Memory: WNL  Fund of knowledge:  Good  Insight:   Good  Judgment:  Good  Impulse Control: Good   Risk Assessment: Danger to Self:  No Self-injurious Behavior: No Danger to Others: No Duty to Warn:no Physical Aggression / Violence:No  Access to Firearms a concern: No  Gang Involvement:No   Subjective: Pt shares that she "has been doing OK I guess.  I have been so frustrated lately because of the pain I experience after my aquatic therapy or PT.  I had a session on Tuesday and that night and all day yesterday I was in the bed hurting.  I am hopeful that the spinal cord stimulator will help the pain be less so I can benefit from the therapies.  I don't think the therapies have been very helpful to me so far."  Pt shares she had the psychological evaluation on 10/31 and my pain management doctor got the report yesterday.  Pt shares that she did not feel like the evaluation was helpful.  She shares "it was an Charity fundraiser who was sitting behind a desk asking me questions.  It was not a psychologist or psychiatrist so I am not sure why I even had to go through it."  Pt  also shares that the HR department from her former employer that her LTD has been approved and that the [employer] has told HR that she will not be returning to work; her last day of employment with her company will be the last day of November and that her insurance coverage will stop then as well.  "I was with that company for 7 years and they are just letting me go for nothing that I can control.  It has been an emotional week for me."  Pt will be offered COBRA from her employer but she will likely decline that plan because of the expense of it.  She has also been covered by her husband's health insurance as well so she will continue on that plan.  Pt shares that she has not been sleeping well recently due to her back pain.  She continues to take her pain medications but she is getting less relief from them.  Pt shares that she has been watching TV as a self care activity; she has been less able to cook dinners for her family due to her pain but has been trying when she feels better; she has also been continuing to work on word search puzzles as a means of self care.  Pt shares that her sister has shared with pt her walker and that has helped pt get around her  home more effectively.  Pt shares she continues to take her Cymbalta 90 mg daily and hopes it is helpful.  Encouraged pt to remember to use her deep breathing to help her relax and to breathe through her pain episodes.  Encouraged pt to continue with her self care activities and we will meet in 2 wks for a follow up session.  Interventions: Cognitive Behavioral Therapy  Diagnosis:Adjustment disorder with mixed anxiety and depressed mood  Plan: Treatment Plan Strengths/Abilities:  Intelligent, Intuitive, Willing to participate in therapy Treatment Preferences:  Outpatient Individual Therapy Statement of Needs:  Patient is to use CBT, mindfulness and coping skills to help manage and/or decrease symptoms associated with their diagnosis. Symptoms:   Depressed/Irritable mood, worry, social withdrawal Problems Addressed:  Depressive thoughts, Sadness, Sleep issues, etc. Long Term Goals:  Pt to reduce overall level, frequency, and intensity of the feelings of depression/anxiety as evidenced by decreased irritability, negative self talk, and helpless feelings from 6 to 7 days/week to 0 to 1 days/week, per client report, for at least 3 consecutive months.  Progress:  Short Term Goals:  Pt to verbally express understanding of the relationship between feelings of depression/anxiety and their impact on thinking patterns and behaviors.  Pt to verbalize an understanding of the role that distorted thinking plays in creating fears, excessive worry, and ruminations.  Progress:  Target Date:  12/23/2023 Frequency:  Bi-weekly Modality:  Cognitive Behavioral Therapy Interventions by Therapist:  Therapist will use CBT, Mindfulness exercises, Coping skills and Referrals, as needed by client. Client has verbally approved this treatment plan.  Karie Kirks, [facility]

## 2023-03-05 ENCOUNTER — Other Ambulatory Visit: Payer: Self-pay | Admitting: Family Medicine

## 2023-03-05 DIAGNOSIS — J0100 Acute maxillary sinusitis, unspecified: Secondary | ICD-10-CM

## 2023-03-05 MED ORDER — TRIAMCINOLONE ACETONIDE 55 MCG/ACT NA AERO
2.0000 | INHALATION_SPRAY | Freq: Every day | NASAL | 12 refills | Status: DC
Start: 2023-03-05 — End: 2023-10-18

## 2023-03-08 ENCOUNTER — Encounter: Payer: Self-pay | Admitting: Student in an Organized Health Care Education/Training Program

## 2023-03-08 DIAGNOSIS — G8929 Other chronic pain: Secondary | ICD-10-CM

## 2023-03-08 DIAGNOSIS — M961 Postlaminectomy syndrome, not elsewhere classified: Secondary | ICD-10-CM

## 2023-03-12 ENCOUNTER — Encounter: Payer: Self-pay | Admitting: Neurosurgery

## 2023-03-12 ENCOUNTER — Other Ambulatory Visit: Payer: Self-pay | Admitting: Orthopedic Surgery

## 2023-03-12 DIAGNOSIS — Z981 Arthrodesis status: Secondary | ICD-10-CM

## 2023-03-12 MED ORDER — OXYCODONE HCL 5 MG PO TABS: 5.0000 mg | ORAL_TABLET | Freq: Three times a day (TID) | ORAL | 0 refills | Status: DC | PRN

## 2023-03-12 NOTE — Telephone Encounter (Signed)
Please let her know that oxycodone was sent to her pharmacy.   PMP reviewed and is appropriate.   Thanks.

## 2023-03-12 NOTE — Telephone Encounter (Signed)
Patient notified

## 2023-03-17 ENCOUNTER — Ambulatory Visit: Payer: No Typology Code available for payment source | Admitting: Psychology

## 2023-03-17 ENCOUNTER — Ambulatory Visit
Admission: RE | Admit: 2023-03-17 | Discharge: 2023-03-17 | Disposition: A | Discharge status: Home / Self Care | Payer: No Typology Code available for payment source | Source: Ambulatory Visit | Attending: Student in an Organized Health Care Education/Training Program | Admitting: Student in an Organized Health Care Education/Training Program

## 2023-03-17 ENCOUNTER — Ambulatory Visit
Payer: No Typology Code available for payment source | Attending: Student in an Organized Health Care Education/Training Program | Admitting: Student in an Organized Health Care Education/Training Program

## 2023-03-17 ENCOUNTER — Encounter: Payer: Self-pay | Admitting: Student in an Organized Health Care Education/Training Program

## 2023-03-17 DIAGNOSIS — M961 Postlaminectomy syndrome, not elsewhere classified: Secondary | ICD-10-CM

## 2023-03-17 DIAGNOSIS — G894 Chronic pain syndrome: Secondary | ICD-10-CM | POA: Diagnosis present

## 2023-03-17 DIAGNOSIS — M5416 Radiculopathy, lumbar region: Secondary | ICD-10-CM

## 2023-03-17 DIAGNOSIS — M545 Low back pain, unspecified: Secondary | ICD-10-CM | POA: Insufficient documentation

## 2023-03-17 DIAGNOSIS — G8929 Other chronic pain: Secondary | ICD-10-CM

## 2023-03-17 MED ORDER — CEFAZOLIN SODIUM-DEXTROSE 2-4 GM/100ML-% IV SOLN
2.0000 g | Freq: Once | INTRAVENOUS | Status: AC
  Administered 2023-03-17: 1 g via INTRAVENOUS
  Filled 2023-03-17: qty 100

## 2023-03-17 MED ORDER — MIDAZOLAM HCL 5 MG/5ML IJ SOLN
INTRAMUSCULAR | Status: AC
  Filled 2023-03-17: qty 5

## 2023-03-17 MED ORDER — FENTANYL CITRATE (PF) 100 MCG/2ML IJ SOLN
INTRAMUSCULAR | Status: AC
  Filled 2023-03-17: qty 2

## 2023-03-17 MED ORDER — CEFAZOLIN SODIUM 1 G IJ SOLR
INTRAMUSCULAR | Status: AC
  Filled 2023-03-17: qty 20

## 2023-03-17 MED ORDER — CEPHALEXIN 500 MG PO CAPS: 500.0000 mg | ORAL_CAPSULE | Freq: Four times a day (QID) | ORAL | 0 refills | Status: AC

## 2023-03-17 MED ORDER — ROPIVACAINE HCL 2 MG/ML IJ SOLN
9.0000 mL | Freq: Once | INTRAMUSCULAR | Status: AC
Start: 2023-03-17 — End: 2023-03-17
  Administered 2023-03-17: 9 mL via PERINEURAL

## 2023-03-17 MED ORDER — ROPIVACAINE HCL 2 MG/ML IJ SOLN
INTRAMUSCULAR | Status: AC
  Filled 2023-03-17: qty 20

## 2023-03-17 MED ORDER — MIDAZOLAM HCL 5 MG/5ML IJ SOLN
0.5000 mg | Freq: Once | INTRAMUSCULAR | Status: AC
  Administered 2023-03-17: 2.5 mg via INTRAVENOUS

## 2023-03-17 MED ORDER — LIDOCAINE HCL 2 % IJ SOLN
20.0000 mL | Freq: Once | INTRAMUSCULAR | Status: AC
  Administered 2023-03-17: 400 mg

## 2023-03-17 MED ORDER — ROPIVACAINE HCL 2 MG/ML IJ SOLN
9.0000 mL | Freq: Once | INTRAMUSCULAR | Status: AC
  Administered 2023-03-17: 9 mL via PERINEURAL

## 2023-03-17 MED ORDER — LIDOCAINE HCL 2 % IJ SOLN
INTRAMUSCULAR | Status: AC
  Filled 2023-03-17: qty 20

## 2023-03-17 MED ORDER — LACTATED RINGERS IV SOLN: Freq: Once | INTRAVENOUS | Status: AC

## 2023-03-17 MED ORDER — FENTANYL CITRATE (PF) 100 MCG/2ML IJ SOLN
25.0000 ug | INTRAMUSCULAR | Status: DC | PRN
  Administered 2023-03-17: 100 ug via INTRAVENOUS

## 2023-03-17 NOTE — Progress Notes (Signed)
PROVIDER NOTE: Interpretation of information contained herein should be left to medically-trained personnel. Specific patient instructions are provided elsewhere under "Patient Instructions" section of medical record. This document was created in part using STT-dictation technology, any transcriptional errors that may result from this process are unintentional.  Patient: Margaret Cuevas Type: Established DOB: [birth year] MRN: [id number] PCP: [name], MD  Service: Procedure DOS: 03/17/2023 Setting: Ambulatory Location: Ambulatory outpatient facility Delivery: Face-to-face Provider: Edward Jolly, MD Specialty: Interventional Pain Management Specialty designation: 09 Location: Outpatient facility Ref. Prov.: Edward Jolly, MD       Interventional Therapy   Primary Reason for Admission: Surgical management of chronic pain condition.   Procedure:              Type: NEVRO Trial Spinal Cord Neurostimulator Implant (Percutaneous, interlaminar, posterior epidural placement) Laterality: Bilateral (-50)  Level: Lumbar  Imaging: Fluoroscopic guidance Anesthesia: Local anesthesia (1-2% Lidocaine) Sedation: Moderate Sedation                       DOS: 03/17/2023  Performed by: Edward Jolly, MD  Purpose: Diagnostic. To determine if a permanent implant may be effective in controlling some or all of Margaret Cuevas's chronic pain symptoms.  Rationale (medical necessity): procedure needed and proper for the diagnosis and/or treatment of Margaret Cuevas's medical symptoms and needs. 1. Failed back surgical syndrome   2. Lumbar post-laminectomy syndrome   3. Chronic radicular lumbar pain   4. Chronic pain syndrome    NAS-11 Pain score:   Pre-procedure: 8 /10   Post-procedure: 0-No pain/10     Target: Posterior epidural space over the dorsal columns of the spinal cord. Location: Posterior intraspinal canal Region: Thoracolumbar  Approach: Translaminar percutaneous  Type of procedure: Surgical    Position / Prep / Materials:  Position: Prone  Prep solution: ChloraPrep (2% chlorhexidine gluconate and 70% isopropyl alcohol) Prep Area: Entire  Posterior  Thoracolumbar  Region  Materials:  Tray: Implant tray Needle(s):  Type: Epidural  Gauge (G):  14   Length: Regular (10cm)  Qty: 2  H&P (Pre-op Assessment):  Margaret Cuevas is a 47 y.o. (year old), female patient, seen today for interventional treatment. She  has a past surgical history that includes Abdominal hysterectomy; Gastric bypass (2006); ORIF finger / thumb fracture; Back surgery; Ankle fracture surgery; Cholecystectomy; Ear tube removal; Cesarean section; Appendectomy; Colonoscopy (N/A, 05/04/2022); Esophagogastroduodenoscopy (N/A, 05/04/2022); Right RIGHT L4-5 FAR LATERAL DISCECTOMY (08/07/2022); Anterior lateral lumbar fusion with percutaneous screw 1 level (N/A, 11/09/2022); and Application of intraoperative CT scan (N/A, 11/09/2022).  Initial Vital Signs:  Pulse/EKG Rate: 62ECG Heart Rate: 75 (NSR) Temp: 97.7 F (36.5 C) Resp: 16 BP: 121/85 SpO2: 95 %  BMI: Estimated body mass index is 42.27 kg/m as calculated from the following:   Height as of this encounter: 5\' 5"  (1.651 m).   Weight as of this encounter: 254 lb (115.2 kg).  Risk Assessment: Allergies: Reviewed. She is allergic to ibuprofen.  Allergy Precautions: None required Coagulopathies: Reviewed. None identified.  Blood-thinner therapy: None at this time Active Infection(s): Reviewed. None identified. Margaret Cuevas is afebrile  Site Confirmation: Margaret Cuevas was asked to confirm the procedure and laterality before marking the site, which she did. Procedure checklist: Completed Consent: Before the procedure and under the influence of no sedative(s), amnesic(s), or anxiolytics, the patient was informed of the treatment options, risks and possible complications. To fulfill our ethical and legal obligations, as recommended by the American Medical Association's Code  of Ethics, I have informed the patient of my clinical impression; the nature and purpose of the treatment or procedure; the risks, benefits, and possible complications of the intervention; the alternatives, including doing nothing; the risk(s) and benefit(s) of the alternative treatment(s) or procedure(s); and the risk(s) and benefit(s) of doing nothing.  Margaret Cuevas was provided with information about the general risks and possible complications associated with most interventional procedures. These include, but are not limited to: failure to achieve desired goals, infection, bleeding, organ or nerve damage, allergic reactions, paralysis, and/or death.  In addition, she was informed of those risks and possible complications associated to this particular procedure, which include, but are not limited to: damage to the implant; failure to decrease pain; local, systemic, or serious CNS infections, intraspinal abscess with possible cord compression and paralysis, or life-threatening such as meningitis; intrathecal and/or epidural bleeding with formation of hematoma with possible spinal cord compression and permanent paralysis; organ damage; nerve injury or damage with subsequent sensory, motor, and/or autonomic system dysfunction, resulting in transient or permanent pain, numbness, and/or weakness of one or several areas of the body; allergic reactions, either minor or major life-threatening, such as anaphylactic or anaphylactoid reactions.  Furthermore, Margaret Cuevas was informed of those risks and complications associated with the medications. These include, but are not limited to: allergic reactions (i.e.: anaphylactic or anaphylactoid reactions); arrhythmia;  Hypotension/hypertension; cardiovascular collapse; respiratory depression and/or shortness of breath; swelling or edema; medication-induced neural toxicity; particulate matter embolism and blood vessel occlusion with resultant organ, and/or nervous system  infarction and permanent paralysis.  Finally, she was informed that Medicine is not an exact science; therefore, there is also the possibility of unforeseen or unpredictable risks and/or possible complications that may result in a catastrophic outcome. The patient indicated having understood very clearly. We have given the patient no guarantees and we have made no promises. Enough time was given to the patient to ask questions, all of which were answered to the patient's satisfaction. Margaret Cuevas has indicated that she wanted to continue with the procedure. Attestation: I, the ordering provider, attest that I have discussed with the patient the benefits, risks, side-effects, alternatives, likelihood of achieving goals, and potential problems during recovery for the procedure that I have provided informed consent. Date  Time: 03/17/2023  7:51 AM  Pre-Procedure Preparation:  Monitoring: As per clinic protocol. Respiration, ETCO2, SpO2, BP, heart rate and rhythm monitor placed and checked for adequate function Safety Precautions: Patient was assessed for positional comfort and pressure points before starting the procedure. Time-out: I initiated and conducted the "Time-out" before starting the procedure, as per protocol. The patient was asked to participate by confirming the accuracy of the "Time Out" information. Verification of the correct person, site, and procedure were performed and confirmed by me, the nursing staff, and the patient. "Time-out" conducted as per Joint Commission's Universal Protocol (UP.01.01.01). Time: 0840 Start Time: 0840 hrs.  Description/Narrative of Procedure:          Rationale (medical necessity): procedure needed and proper for the diagnosis and/or treatment of the patient's medical symptoms and needs. Procedural Technique Safety Precautions: Aspiration looking for blood return was conducted prior to all injections. At no point did we inject any substances, as a needle was  being advanced. No attempts were made at seeking any paresthesias. Safe injection practices and needle disposal techniques used. Medications properly checked for expiration dates. SDV (single dose vial) medications used. Description of the Procedure: Protocol guidelines were followed. The patient was  assisted into a comfortable position. The target area was identified and the area prepped in the usual manner. Skin & deeper tissues infiltrated with local anesthetic. Appropriate amount of time allowed to pass for local anesthetics to take effect. The procedure needles were then advanced to the target area. Proper needle placement secured. Negative aspiration confirmed. Solution injected in intermittent fashion, asking for systemic symptoms every 0.5cc of injectate. The needles were then removed and the area cleansed, making sure to leave some of the prepping solution back to take advantage of its long term bactericidal properties.  Technical description of procedure: Availability of a responsible, adult driver, and NPO status confirmed. Informed consent was obtained after having discussed risks and possible complications. An IV was started. The patient was then taken to the fluoroscopy suite, where the patient was placed in position for the procedure, over the fluoroscopy table. The patient was then monitored in the usual manner. Fluoroscopy was manipulated to obtain the best possible view of the target. Parallex error was corrected before commencing the procedure. Once a clear view of the target had been obtained, the skin and deeper tissues over the procedure site were infiltrated using lidocaine, loaded in a 10 cc luer-loc syringe with a 0.5 inch, 25-G needle. The introducer needle(s) was/were then inserted through the skin and deeper tissues. A paramidline approach was used to enter the posterior epidural space at a 30 angle, using "Loss-of-resistance Technique" with 3 ml of PF-NaCl (0.9% NSS). Correct needle  placement was confirmed in the antero-posterior and lateral fluoroscopic views. The lead was gently introduced and manipulated under real-time fluoroscopy, constantly assessing for pain, discomfort, or paresthesias, until the tip rested at the desired level. Both sides were done in identical fashion. Electrode placement was tested until appropriate coverage was attained. Once the patient confirmed that the stimulation was over the desired area, the lead(s) was/were secured in place and the introducer needles removed. This was done under real-time fluoroscopy while observing the electrode tip to avoid unintended migration. The area was covered with a non-occlusive dressing and the patient transported to recovery for further programming.  Vitals:   03/17/23 0911 03/17/23 0921 03/17/23 0931 03/17/23 0939  BP: 123/86 123/81 113/79   Pulse:      Resp: 17 16 13 17   Temp:      TempSrc:      SpO2: 99% 95% 96% 97%  Weight:      Height:        Start Time: 0840 hrs. End Time: 0911 hrs.  Neurostimulator Details:   Lead(s):  Brand:  NEVRO          Epidural Access Level:  T12-L1 T12-L1  Lead implant:  Bilateral   No. of Electrodes/Lead:  8 8  Laterality:  Left Right  Top electrode location:  T8 T9  Bottom electrode location:  T10 T11  Model No.: [id number] Same  Length: 70cm Same  Lot No.: [id number] same       Imaging Guidance (Spinal):          Type of Imaging Technique: Fluoroscopy Guidance (Spinal) Indication(s): Fluoroscopy guidance for needle placement to enhance accuracy in procedures requiring precise needle localization for targeted delivery of medication in or near specific anatomical locations not easily accessible without such real-time imaging assistance. Exposure Time: Please see nurses notes. Contrast: None used. Fluoroscopic Guidance: I was personally present during the use of fluoroscopy. "Tunnel Vision Technique" used to obtain the best possible view of the target area.  Parallax error corrected  before commencing the procedure. "Direction-depth-direction" technique used to introduce the needle under continuous pulsed fluoroscopy. Once target was reached, antero-posterior, oblique, and lateral fluoroscopic projection used confirm needle placement in all planes. Images permanently stored in EMR. Interpretation: No contrast injected. I personally interpreted the imaging intraoperatively. Adequate needle placement confirmed in multiple planes. Permanent images saved into the patient's record.  Antibiotic Prophylaxis:   Anti-infectives (From admission, onward)    Start     Dose/Rate Route Frequency Ordered Stop   03/17/23 0830  ceFAZolin (ANCEF) IVPB 2g/100 mL premix        2 g 200 mL/hr over 30 Minutes Intravenous  Once 03/17/23 0817 03/17/23 0853   03/17/23 0000  cephALEXin (KEFLEX) 500 MG capsule        500 mg Oral 4 times daily 03/17/23 0816 03/24/23 2359      Indication(s): None identified  Post-operative Assessment:  Post-procedure Vital Signs:  Pulse/HCG Rate: 6272 Temp: 97.7 F (36.5 C) Resp: 17 BP: 113/79 SpO2: 97 %  Complications: No immediate post-treatment complications observed by team, or reported by patient.  Note: The patient tolerated the entire procedure well. A repeat set of vitals were taken after the procedure and the patient was kept under observation following institutional policy, for this type of procedure. Post-procedural neurological assessment was performed, showing return to baseline, prior to discharge. The patient was provided with post-procedure discharge instructions, including a section on how to identify potential problems. Should any problems arise concerning this procedure, the patient was given instructions to immediately contact us, at any time, without hesitation. In any case, we plan to contact the patient by telephone for a follow-up status report regarding this interventional procedure.  Comments:  No additional  relevant information.  Plan of Care  Orders:  Orders Placed This Encounter  Procedures   DG PAIN CLINIC C-ARM 1-60 MIN NO REPORT    Intraoperative interpretation by procedural physician at [facility].    Standing Status:   Standing    Number of Occurrences:   1    Order Specific Question:   Reason for exam:    Answer:   Assistance in needle guidance and placement for procedures requiring needle placement in or near specific anatomical locations not easily accessible without such assistance.   Requested Prescriptions   Signed Prescriptions Disp Refills   cephALEXin (KEFLEX) 500 MG capsule 28 capsule 0    Sig: Take 1 capsule (500 mg total) by mouth 4 (four) times daily for 7 days.     Medications administered: We administered lidocaine, lactated ringers, midazolam, fentaNYL, ropivacaine (PF) 2 mg/mL (0.2%), ropivacaine (PF) 2 mg/mL (0.2%), and ceFAZolin.  See the medical record for exact dosing, route, and time of administration.  Follow-up plan:   Return in about 1 week (around 03/24/2023) for SCS lead pull in afternoon.       Recent Visits Date Type Provider Dept  02/11/23 Office Visit Edward Jolly, MD [facility]  Showing recent visits within past 90 days and meeting all other requirements Today's Visits Date Type Provider Dept  03/17/23 Procedure visit Edward Jolly, MD [facility]  Showing today's visits and meeting all other requirements Future Appointments No visits were found meeting these conditions. Showing future appointments within next 90 days and meeting all other requirements  Disposition: Discharge home  Discharge (Date  Time): 03/17/2023;   hrs.   Primary Care Physician: [name], MD Location: [facility] Note by: Edward Jolly, MD (TTS  technology used. I apologize for any typographical errors that were not detected and corrected.) Date: 03/17/2023; Time: 9:55 AM

## 2023-03-17 NOTE — Patient Instructions (Signed)
.  sToday we did the following -We have done a Spinal Cord Stimulator Trial with NEVRO  -As long as the leads are in place, do not bathe or shower. You may sponge bathe.  -While the lead is in place, please limit the bending, lifting, or twisting because the lead can move.  -The things we want to see is if your pain improves (and by what percentage), if you can do more activity (don't overdo it), and if you can use less of your "as needed" medicine. Do not stop long acting medicines like methadone, oxycontin, MS Contin, etc without checking with [place].  -It is VERY important that you pick up the antibiotics we prescribed, Keflex, on your way home from the trial and take them as prescribed(4 times a day), starting today, for as long as the lead is in place.  -The Spina Cord Stimulator Representative will be in contact with you while the lead is in place to make sure the trial goes as well as possible.  -Please contact us with any questions or concerns at any time during the trial.   -If you start running a fever over 100 degrees, have severe back pain, or new pain running down the legs, or drainage coming from the lead site, contact us immediately and/or go to the emergency room.  -Please do not restart any sort of medication that can thin your blood such as Aspirin, ibuprofen, motrin, aleve, plavix, coumadin, etc. If you aren't sure, call and ask.  -We will have you return next Wednesday to have the lead removed. If this is successful, at that point we can go over the details about the permanent implant.

## 2023-03-17 NOTE — Progress Notes (Signed)
Safety precautions to be maintained throughout the outpatient stay will include: orient to surroundings, keep bed in low position, maintain call bell within reach at all times, provide assistance with transfer out of bed and ambulation.  

## 2023-03-17 NOTE — Progress Notes (Signed)
1000 States headache is better. Encouraged to rest, drink plenty of caffeinated fluids today. States she is ready to go home.

## 2023-03-18 ENCOUNTER — Telehealth: Payer: Self-pay

## 2023-03-18 ENCOUNTER — Ambulatory Visit: Payer: No Typology Code available for payment source | Admitting: Psychology

## 2023-03-18 DIAGNOSIS — F4323 Adjustment disorder with mixed anxiety and depressed mood: Secondary | ICD-10-CM | POA: Diagnosis not present

## 2023-03-18 NOTE — Telephone Encounter (Signed)
Post procedure follow up.  Patient states she is doing good.  

## 2023-03-18 NOTE — Progress Notes (Signed)
[facility] Counselor/Therapist Progress Note  Patient ID: Margaret Cuevas, MRN: [id number],    Date: 03/18/2023  Time Spent: 45 mins start time: 1100    end time: 1145  Treatment Type: Individual Therapy  Reported Symptoms: Pt presents for session, via Caregility video, granting consent for the session.  Pt states she is in her home with no one else present; she also shares she understands the limits of virtual sessions.  I shared with pt that I am in my office with no one else here.  Mental Status Exam: Appearance:  Casual     Behavior: Appropriate  Motor: Normal  Speech/Language:  Clear and Coherent  Affect: Appropriate  Mood: normal  Thought process: normal  Thought content:   WNL  Sensory/Perceptual disturbances:   WNL  Orientation: oriented to person, place, and time/date  Attention: Good  Concentration: Good  Memory: WNL  Fund of knowledge:  Good  Insight:   Good  Judgment:  Good  Impulse Control: Good   Risk Assessment: Danger to Self:  No Self-injurious Behavior: No Danger to Others: No Duty to Warn:no Physical Aggression / Violence:No  Access to Firearms a concern: No  Gang Involvement:No   Subjective: Pt shares that she "has been doing OK I guess.  Yesterday they inserted the temporary spinal cord stimulator and my body is not reacting to it well.  I did not sleep well last night at all."  Pt shares that she is used to bouncing back from procedures but is frustrated that she has not been able to do that with her back issues.  She notes that it has almost been a year "since I woke up one morning (03/25/2022) and could not walk."  Pt shares she went to her evaluation for aquatic therapy since our last session and got dismissed from that because she was not making as much progress as they wanted her to make; pt shares they told her to check back with them after the spinal cord stimulator is implanted and she recovers from that procedure.  Their hope is  that her pain would be better controlled and the aquatic therapy would be more beneficial for her then.  Pt shares that she is taking 10 steps forward and 8 steps back.  She continues to take her pain medications but she is getting less relief from them.  Pt shares that she has been watching TV as a self care activity; she has been less able to cook dinners for her family due to her pain but has been trying when she feels better.  She shares that she did go shopping with her sister after an appt next week and she feels good about that.  Pt shares she continues to take her Cymbalta 90 mg daily and hopes it is helpful.  Encouraged pt to remember to use her deep breathing to help her relax and to breathe through her pain episodes.  Pt shares that they are planning to go to her parents' home for Thanksgiving and she is hopeful they can do that; it is a 3 hr drive to their home and pt is not sure she can make that trip.  Encouraged pt to continue with her self care activities and we will meet in 2 wks for a follow up session.  Interventions: Cognitive Behavioral Therapy  Diagnosis:Adjustment disorder with mixed anxiety and depressed mood  Plan: Treatment Plan Strengths/Abilities:  Intelligent, Intuitive, Willing to participate in therapy Treatment Preferences:  Outpatient Individual Therapy Statement of  Needs:  Patient is to use CBT, mindfulness and coping skills to help manage and/or decrease symptoms associated with their diagnosis. Symptoms:  Depressed/Irritable mood, worry, social withdrawal Problems Addressed:  Depressive thoughts, Sadness, Sleep issues, etc. Long Term Goals:  Pt to reduce overall level, frequency, and intensity of the feelings of depression/anxiety as evidenced by decreased irritability, negative self talk, and helpless feelings from 6 to 7 days/week to 0 to 1 days/week, per client report, for at least 3 consecutive months.  Progress:  Short Term Goals:  Pt to verbally express  understanding of the relationship between feelings of depression/anxiety and their impact on thinking patterns and behaviors.  Pt to verbalize an understanding of the role that distorted thinking plays in creating fears, excessive worry, and ruminations.  Progress:  Target Date:  12/23/2023 Frequency:  Bi-weekly Modality:  Cognitive Behavioral Therapy Interventions by Therapist:  Therapist will use CBT, Mindfulness exercises, Coping skills and Referrals, as needed by client. Client has verbally approved this treatment plan.  Karie Kirks, [facility]
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
## 2023-03-22 ENCOUNTER — Encounter: Payer: Self-pay | Admitting: Neurosurgery

## 2023-03-24 ENCOUNTER — Encounter: Payer: Self-pay | Admitting: Psychiatry

## 2023-03-24 ENCOUNTER — Encounter: Payer: Self-pay | Admitting: Student in an Organized Health Care Education/Training Program

## 2023-03-24 ENCOUNTER — Ambulatory Visit
Admission: RE | Admit: 2023-03-24 | Discharge: 2023-03-24 | Disposition: A | Discharge status: Home / Self Care | Payer: No Typology Code available for payment source | Source: Ambulatory Visit | Attending: Student in an Organized Health Care Education/Training Program | Admitting: Student in an Organized Health Care Education/Training Program

## 2023-03-24 ENCOUNTER — Ambulatory Visit
Payer: No Typology Code available for payment source | Attending: Student in an Organized Health Care Education/Training Program | Admitting: Student in an Organized Health Care Education/Training Program

## 2023-03-24 ENCOUNTER — Telehealth: Payer: No Typology Code available for payment source | Admitting: Psychiatry

## 2023-03-24 VITALS — BP 121/84 | HR 83 | Temp 98.0°F | Resp 16 | Ht 65.0 in | Wt 254.0 lb

## 2023-03-24 DIAGNOSIS — F419 Anxiety disorder, unspecified: Secondary | ICD-10-CM | POA: Diagnosis not present

## 2023-03-24 DIAGNOSIS — G4733 Obstructive sleep apnea (adult) (pediatric): Secondary | ICD-10-CM | POA: Diagnosis not present

## 2023-03-24 DIAGNOSIS — F320 Major depressive disorder, single episode, mild: Secondary | ICD-10-CM

## 2023-03-24 DIAGNOSIS — G894 Chronic pain syndrome: Secondary | ICD-10-CM

## 2023-03-24 DIAGNOSIS — M961 Postlaminectomy syndrome, not elsewhere classified: Secondary | ICD-10-CM

## 2023-03-24 MED ORDER — DULOXETINE HCL 30 MG PO CPEP: 30.0000 mg | ORAL_CAPSULE | Freq: Every day | ORAL | 0 refills | Status: DC

## 2023-03-24 NOTE — Patient Instructions (Signed)
Continue duloxetine 90 mg daily Continue BuSpar 7.5 mg twice a day Next appointment: 2/19 at 11 am

## 2023-03-24 NOTE — Progress Notes (Signed)
Patient: Margaret Cuevas  Service Category: Procedure  Provider: Edward Jolly, MD  DOB: 1975/12/20  DOS: 03/24/2023  Referring Provider: [name], MD  MRN: [id number]  Setting: Ambulatory outpatient  PCP: Margaret Limerick, MD  Type: Established Patient  Specialty: Interventional Pain Management    Location: Office  Delivery: Face-to-face  SCS TRIAL POST-OP EVALUATION   Primary Reason(s) for Visit: Encounter for removal of temporary spinal cord stimulator lead(s) and evaluation of trial implant. CC: Back Pain and Leg Pain (bilat)  HPI  Margaret Cuevas is a 47 y.o. year old, female patient, who comes today for a post-procedure evaluation. She has Influenza vaccine needed; Fibromyalgia; Lipoma of back; H/O total hysterectomy; History of appendectomy; OSA on CPAP; Anxiety, generalized; Chronic radicular lumbar pain; S/P lumbar fusion; Recurrent herniation of lumbar disc; Spinal stenosis of lumbar region without neurogenic claudication; Failed back surgical syndrome; and Chronic pain syndrome on their problem list. Her primarily concern today is the Back Pain and Leg Pain (bilat)  [name] comes in today, after a SCS (Spinal Cord Stimulator) Trial Implant on 03/18/2023, to have her percutaneous, temporary neurostimulator lead(s) removed and to evaluate the trial experience to determine if a permanent implant may be effective in controlling some or all of her chronic pain symptoms.  Overall, patient endorses a positive spinal cord stimulator trial.  She endorses approximately 75% pain relief for the duration of her trial and also improvement in her functional status.  Unfortunately, her mother passed away and she was at the hospital quite a bit during her 7-day trial.  She states that she would not have been able to walk and move around as much as she was without her trial leads in place.  SCS trial leads removed under live fluoroscopy, tips intact.  Laboratory Chemistry Profile   Renal Lab Results   Component Value Date   BUN 17 10/28/2022   CREATININE 0.66 10/28/2022   BCR 27 (H) 11/14/2021   GFRAA >60 05/22/2017   GFRNONAA >60 10/28/2022   SPECGRAV 1.015 05/13/2022   PHUR 5.0 05/13/2022   PROTEINUR NEGATIVE 10/28/2022     Electrolytes Lab Results  Component Value Date   NA 138 10/28/2022   K 4.6 10/28/2022   CL 101 10/28/2022   CALCIUM 9.1 10/28/2022   PHOS 4.6 (H) 11/14/2021     Hepatic Lab Results  Component Value Date   AST 19 06/27/2020   ALT 15 06/27/2020   ALBUMIN 4.1 11/14/2021   ALKPHOS 55 06/27/2020   LIPASE 24 06/27/2020     ID Lab Results  Component Value Date   SARSCOV2NAA NEGATIVE 07/08/2022   STAPHAUREUS NEGATIVE 10/28/2022   MRSAPCR NEGATIVE 10/28/2022   PREGTESTUR NEGATIVE 05/22/2017     Bone No results found for: "VD25OH", "VD125OH2TOT", "GM0102VO5", "DG6440HK7", "25OHVITD1", "25OHVITD2", "25OHVITD3", "TESTOFREE", "TESTOSTERONE"   Endocrine Lab Results  Component Value Date   GLUCOSE 96 10/28/2022   GLUCOSEU NEGATIVE 10/28/2022   HGBA1C 6.0 (H) 09/22/2022   TSH 3.360 01/25/2023     Neuropathy Lab Results  Component Value Date   HGBA1C 6.0 (H) 09/22/2022     CNS No results found for: "COLORCSF", "APPEARCSF", "RBCCOUNTCSF", "WBCCSF", "POLYSCSF", "LYMPHSCSF", "EOSCSF", "PROTEINCSF", "GLUCCSF", "JCVIRUS", "CSFOLI", "IGGCSF", "LABACHR", "ACETBL"   Inflammation (CRP: Acute  ESR: Chronic) Lab Results  Component Value Date   ESRSEDRATE 5 07/03/2016     Rheumatology No results found for: "RF", "ANA", "LABURIC", "URICUR", "LYMEIGGIGMAB", "LYMEABIGMQN", "HLAB27"   Coagulation Lab Results  Component Value Date   PLT 293  10/28/2022     Cardiovascular Lab Results  Component Value Date   BNP 14.0 05/22/2017   HGB 10.4 (L) 10/28/2022   HCT 33.7 (L) 10/28/2022     Screening Lab Results  Component Value Date   SARSCOV2NAA NEGATIVE 07/08/2022   STAPHAUREUS NEGATIVE 10/28/2022   MRSAPCR NEGATIVE 10/28/2022   PREGTESTUR  NEGATIVE 05/22/2017     Cancer No results found for: "CEA", "CA125", "LABCA2"   Allergens No results found for: "ALMOND", "APPLE", "ASPARAGUS", "AVOCADO", "BANANA", "BARLEY", "BASIL", "BAYLEAF", "GREENBEAN", "LIMABEAN", "WHITEBEAN", "BEEFIGE", "REDBEET", "BLUEBERRY", "BROCCOLI", "CABBAGE", "MELON", "CARROT", "CASEIN", "CASHEWNUT", "CAULIFLOWER", "CELERY"     Note: Lab results reviewed.  Recent Imaging Results  Results for orders placed during the hospital encounter of 11/09/22  DG C-Arm 1-60 Min-No Report  Narrative Fluoroscopy was utilized by the requesting physician.  No radiographic interpretation.  Interpretation Report: Fluoroscopy was used during the procedure to assist with needle guidance. The images were interpreted intraoperatively by the requesting physician.        Meds   Current Outpatient Medications:    acetaminophen (TYLENOL) 650 MG CR tablet, Take 1,300 mg by mouth every 8 (eight) hours as needed for pain., Disp: , Rfl:    busPIRone (BUSPAR) 7.5 MG tablet, Take 1 tablet (7.5 mg total) by mouth 2 (two) times daily., Disp: 180 tablet, Rfl: 1   Calcium Citrate-Vitamin D 315-5 MG-MCG TABS, Take by mouth., Disp: , Rfl:    celecoxib (CELEBREX) 100 MG capsule, Take 1 capsule by mouth 2 (two) times daily. Rheum, Disp: , Rfl:    cephALEXin (KEFLEX) 500 MG capsule, Take 1 capsule (500 mg total) by mouth 4 (four) times daily for 7 days., Disp: 28 capsule, Rfl: 0   cyclobenzaprine (FLEXERIL) 10 MG tablet, TAKE 1 TABLET(10 MG) BY MOUTH THREE TIMES DAILY AS NEEDED FOR MUSCLE SPASMS, Disp: 90 tablet, Rfl: 3   [START ON 04/02/2023] DULoxetine (CYMBALTA) 30 MG capsule, Take 1 capsule (30 mg total) by mouth daily. Total of 90 mg daily, take along with 60 mg cap, Disp: 90 capsule, Rfl: 0   DULoxetine (CYMBALTA) 60 MG capsule, Take 60 mg by mouth every morning. And then an additional if needed, Disp: , Rfl:    estradiol (VIVELLE-DOT) 0.1 MG/24HR patch, Place 1 patch (0.1 mg total) onto  the skin 2 (two) times a week. Dr Feliberto Gottron, Disp: 8 patch, Rfl: 0   gabapentin (NEURONTIN) 600 MG tablet, Take 2 tablets (1,200 mg total) by mouth 3 (three) times daily., Disp: 180 tablet, Rfl: 1   Multiple Vitamin (MULTI-VITAMIN) tablet, Take 1 tablet by mouth daily., Disp: , Rfl:    omeprazole (PRILOSEC) 40 MG capsule, Take 40 mg by mouth daily., Disp: , Rfl:    oxyCODONE (ROXICODONE) 5 MG immediate release tablet, Take 1 tablet (5 mg total) by mouth every 8 (eight) hours as needed for severe pain (pain score 7-10)., Disp: 21 tablet, Rfl: 0   senna (SENOKOT) 8.6 MG TABS tablet, Take 1 tablet (8.6 mg total) by mouth 2 (two) times daily., Disp: 120 tablet, Rfl: 0   sucralfate (CARAFATE) 1 g tablet, Take 1 g by mouth 4 (four) times daily., Disp: , Rfl:    triamcinolone (NASACORT) 55 MCG/ACT AERO nasal inhaler, Place 2 sprays into the nose daily., Disp: 1 each, Rfl: 12  ROS  Constitutional: Denies any fever or chills Gastrointestinal: No reported hemesis, hematochezia, vomiting, or acute GI distress Musculoskeletal: Denies any acute onset joint swelling, redness, loss of ROM, or weakness Neurological: No reported episodes  of acute onset apraxia, aphasia, dysarthria, agnosia, amnesia, paralysis, loss of coordination, or loss of consciousness  Allergies  Ms. Pado is allergic to ibuprofen.  PFSH  Drug: Ms. Blankley  reports no history of drug use. Alcohol:  reports current alcohol use. Tobacco:  reports that she has quit smoking. Her smoking use included cigarettes. She has been exposed to tobacco smoke. She has never used smokeless tobacco. Medical:  has a past medical history of Anemia, Anginal pain (HCC), Anxiety, Arthritis, Asthma, Chest pain, Complication of anesthesia, Fibromyalgia, GERD (gastroesophageal reflux disease), Hiatal hernia, History of Roux-en-Y gastric bypass (2006), Lipoma of back, Lumbar disc herniation, Lumbar radiculopathy, Obesity, OSA on CPAP, and Pneumonia. Surgical: Ms.  Conkling  has a past surgical history that includes Abdominal hysterectomy; Gastric bypass (2006); ORIF finger / thumb fracture; Back surgery; Ankle fracture surgery; Cholecystectomy; Ear tube removal; Cesarean section; Appendectomy; Colonoscopy (N/A, 05/04/2022); Esophagogastroduodenoscopy (N/A, 05/04/2022); Right RIGHT L4-5 FAR LATERAL DISCECTOMY (08/07/2022); Anterior lateral lumbar fusion with percutaneous screw 1 level (N/A, 11/09/2022); and Application of intraoperative CT scan (N/A, 11/09/2022). Family: family history includes Breast cancer (age of onset: 29) in her cousin; Cancer in her maternal grandmother and paternal grandfather; Diabetes in her maternal grandmother and mother; Drug abuse in her sister; Hypertension in her father, maternal grandmother, and paternal grandmother.  Postop Exam  General appearance: Afebrile. Well nourished, well developed, and well hydrated. In no apparent acute distress. Vitals:   03/24/23 1400  BP: 121/84  Pulse: 83  Resp: 16  Temp: 98 F (36.7 C)  TempSrc: Temporal  SpO2: 98%  Weight: 254 lb (115.2 kg)  Height: 5\' 5"  (1.651 m)   BMI Assessment: Estimated body mass index is 42.27 kg/m as calculated from the following:   Height as of this encounter: 5\' 5"  (1.651 m).   Weight as of this encounter: 254 lb (115.2 kg).  Surgical site: Wound is healing well. No redness, tenderness, discharge, abnormal odors, or any other evidence of infection or complications.  Assessment  Primary Diagnosis & Pertinent Problem List: The primary encounter diagnosis was Failed back surgical syndrome. Diagnoses of Lumbar post-laminectomy syndrome and Chronic pain syndrome were also pertinent to this visit.  Diagnosis Status  1. Failed back surgical syndrome   2. Lumbar post-laminectomy syndrome   3. Chronic pain syndrome    Responding Responding Controlled   Plan of Care  Successful Nevro SCS trial, referral to Dr Marcell Barlow for permament implant  Orders:  Orders  Placed This Encounter  Procedures   DG PAIN CLINIC C-ARM 1-60 MIN NO REPORT    Intraoperative interpretation by procedural physician at [facility].    Standing Status:   Standing    Number of Occurrences:   1    Order Specific Question:   Reason for exam:    Answer:   Assistance in needle guidance and placement for procedures requiring needle placement in or near specific anatomical locations not easily accessible without such assistance.   Ambulatory referral to Neurosurgery    Referral Priority:   Routine    Referral Type:   Surgical    Referral Reason:   Specialty Services Required    Referred to Provider:   Venetia Night, MD    Requested Specialty:   Neurosurgery    Number of Visits Requested:   1    Medications administered: Nashly Legendre had no medications administered during this visit.  See the medical record for exact dosing, route, and time of administration.  Follow-up plan:  Return if symptoms worsen or fail to improve.       Recent Visits Date Type Provider Dept  03/17/23 Procedure visit Edward Jolly, MD [facility]  02/11/23 Office Visit Edward Jolly, MD [facility]  Showing recent visits within past 90 days and meeting all other requirements Today's Visits Date Type Provider Dept  03/24/23 Procedure visit Edward Jolly, MD [facility]  Showing today's visits and meeting all other requirements Future Appointments No visits were found meeting these conditions. Showing future appointments within next 90 days and meeting all other requirements  Disposition: Discharge home  Discharge (Date  Time): 03/24/2023; 1456 hrs.   Primary Care Physician: Margaret Limerick, MD Location: [facility] Note by: Edward Jolly, MD (TTS technology used. I apologize for any typographical errors that were not detected and corrected.) Date: 03/24/2023; Time: 3:50 PM

## 2023-03-24 NOTE — Progress Notes (Signed)
Safety precautions to be maintained throughout the outpatient stay will include: orient to surroundings, keep bed in low position, maintain call bell within reach at all times, provide assistance with transfer out of bed and ambulation.  

## 2023-03-29 ENCOUNTER — Other Ambulatory Visit: Payer: Self-pay | Admitting: Family Medicine

## 2023-03-29 ENCOUNTER — Other Ambulatory Visit: Payer: Self-pay | Admitting: Physician Assistant

## 2023-03-29 DIAGNOSIS — Z981 Arthrodesis status: Secondary | ICD-10-CM

## 2023-03-29 MED ORDER — OXYCODONE HCL 5 MG PO TABS: 5.0000 mg | ORAL_TABLET | Freq: Three times a day (TID) | ORAL | 0 refills | Status: DC | PRN

## 2023-03-30 ENCOUNTER — Encounter: Payer: Self-pay | Admitting: Neurosurgery

## 2023-03-30 NOTE — Telephone Encounter (Signed)
FYI- she has an appointment with you on Thursday and wanted you to know this.

## 2023-03-31 ENCOUNTER — Ambulatory Visit: Payer: Federal, State, Local not specified - PPO | Admitting: Psychology

## 2023-04-01 ENCOUNTER — Encounter: Payer: Self-pay | Admitting: Family Medicine

## 2023-04-01 ENCOUNTER — Ambulatory Visit: Payer: No Typology Code available for payment source | Admitting: Neurosurgery

## 2023-04-01 ENCOUNTER — Encounter: Payer: Self-pay | Admitting: Neurosurgery

## 2023-04-01 ENCOUNTER — Other Ambulatory Visit: Payer: Self-pay

## 2023-04-01 VITALS — BP 122/62 | Ht 65.0 in | Wt 254.0 lb

## 2023-04-01 DIAGNOSIS — Z01818 Encounter for other preprocedural examination: Secondary | ICD-10-CM

## 2023-04-01 DIAGNOSIS — G894 Chronic pain syndrome: Secondary | ICD-10-CM | POA: Diagnosis not present

## 2023-04-01 DIAGNOSIS — M961 Postlaminectomy syndrome, not elsewhere classified: Secondary | ICD-10-CM

## 2023-04-01 NOTE — H&P (View-Only) (Signed)
   REFERRING PHYSICIAN:  Edward Jolly, Md [address] [place],  [place] [address]  DOS: 11/09/22 XLIF L4-5 XLIF and posterior spinal fusion, right L5-S1 far lateral foraminotomy   Surgery: 08/07/2022 (R L4/5 far lateral discectomy)    HISTORY OF PRESENT ILLNESS: 04/01/2023 Margaret Cuevas presents today to discuss spinal cord stimulator evaluation results.  She had an improvement of 60 to 75% on her trial.  The difference in percentages is based on what portion of her pain was helped, but she had at least 60% improvement.   8/27/2024[name] is status post L4-5 XLIF and posterior spinal fusion, right L5-S1 far lateral foraminotomy.  Unfortunately, she is not doing very well.  She has continued pain down her right leg.  She is slightly better than preop, but has not made much progress.  She is having some swelling around the right sided incision has some tenderness in this area.  She has significant discomfort with sitting for any length of time.  She has been doing extensive physical and Occupational Therapy.  PHYSICAL EXAMINATION:  General: Patient is well developed, well nourished, calm, collected, and in no apparent distress.   NEUROLOGICAL:  General: In no acute distress.   Awake, alert, oriented to person, place, and time.  Pupils equal round and reactive to light.  Facial tone is symmetric.     Strength:            Side Iliopsoas Quads Hamstring PF DF EHL  R 4+ 5 5 5 5 5   L 5 5 5 5 5 5    Incisions c/d/I     ROS (Neurologic):  Negative except as noted above  IMAGING: No complications noted  ASSESSMENT/PLAN:  Margaret Cuevas is doing fair s/p above surgery.  She has had a successful evaluation for spinal cord stimulation.  She has failed back surgical syndrome and chronic pain syndrome.  She has passed a psychology evaluation and a trial.  I think she is an excellent candidate for spinal cord stimulator.    I discussed the planned procedure at length with the  patient, including the risks, benefits, alternatives, and indications. The risks discussed include but are not limited to bleeding, infection, need for reoperation, spinal fluid leak, stroke, vision loss, anesthetic complication, coma, paralysis, and even death. I also described in detail that improvement was not guaranteed.  The patient expressed understanding of these risks, and asked that we proceed with surgery. I described the surgery in layman's terms, and gave ample opportunity for questions, which were answered to the best of my ability.  Venetia Night Department of neurosurgery

## 2023-04-01 NOTE — Patient Instructions (Addendum)
  Please see below for information in regards to your upcoming surgery:   Planned surgery: thoracic laminectomy for spinal cord stimulator placement Torrie Mayers)   Surgery date: 04/16/23 at [facility] (Medical Mall: [address], [place], [place] [address]) - you will find out your arrival time the business day before your surgery.   Pre-op appointment at [facility] Pre-admit Testing: we will call you with a date/time for this. If you are scheduled for an in person appointment, Pre-admit Testing is located on the first floor of the [facility] building, [address] [facility], [address]. Please bring all prescriptions in the original prescription bottles to your appointment. During this appointment, they will advise you which medications you can take the morning of surgery, and which medications you will need to hold for surgery. Labs (such as blood work, EKG) may be done at your pre-op appointment. You are not required to fast for these labs. Should you need to change your pre-op appointment, please call Pre-admit testing at [phone].      Surgical clearance: we will send a clearance form to Dr Yetta Barre. They may wish to see you in their office prior to signing the clearance form. If so, they may call you to schedule an appointment.     Common restrictions after surgery: No bending, lifting, or twisting ("BLT"). Avoid lifting objects heavier than 10 pounds for the first 6 weeks after surgery. Where possible, avoid household activities that involve lifting, bending, reaching, pushing, or pulling such as laundry, vacuuming, grocery shopping, and childcare. Try to arrange for help from friends and family for these activities while you heal. Do not drive while taking prescription pain medication. Weeks 6 through 12 after surgery: avoid lifting more than 25 pounds.     How to contact us:  If you have any questions/concerns before or after surgery, you can reach [place] at  [phone], or you can send a mychart message. We can be reached by phone or mychart 8am-4pm, Monday-Friday.  *Please note: Calls after 4pm are forwarded to a third party answering service. Mychart messages are not routinely monitored during evenings, weekends, and holidays. Please call our office to contact the answering service for urgent concerns during non-business hours.    If you have FMLA/disability paperwork, please drop it off or fax it to [phone], attention Patty.   Appointments/FMLA & disability paperwork: Joycelyn Rua, & Flonnie Hailstone Registered Nurse/Surgery scheduler: Royston Cowper Medical Assistants: Nash Mantis Physician Assistants: Joan Flores, PA-C, Manning Charity, PA-C & Drake Leach, PA-C Surgeons: Venetia Night, MD & Ernestine Mcmurray, MD

## 2023-04-01 NOTE — Progress Notes (Signed)
   REFERRING PHYSICIAN:  Edward Jolly, Md [address] [place],  [place] [address]  DOS: 11/09/22 XLIF L4-5 XLIF and posterior spinal fusion, right L5-S1 far lateral foraminotomy   Surgery: 08/07/2022 (R L4/5 far lateral discectomy)    HISTORY OF PRESENT ILLNESS: 04/01/2023 Margaret Cuevas presents today to discuss spinal cord stimulator evaluation results.  She had an improvement of 60 to 75% on her trial.  The difference in percentages is based on what portion of her pain was helped, but she had at least 60% improvement.   8/27/2024[name] is status post L4-5 XLIF and posterior spinal fusion, right L5-S1 far lateral foraminotomy.  Unfortunately, she is not doing very well.  She has continued pain down her right leg.  She is slightly better than preop, but has not made much progress.  She is having some swelling around the right sided incision has some tenderness in this area.  She has significant discomfort with sitting for any length of time.  She has been doing extensive physical and Occupational Therapy.  PHYSICAL EXAMINATION:  General: Patient is well developed, well nourished, calm, collected, and in no apparent distress.   NEUROLOGICAL:  General: In no acute distress.   Awake, alert, oriented to person, place, and time.  Pupils equal round and reactive to light.  Facial tone is symmetric.     Strength:            Side Iliopsoas Quads Hamstring PF DF EHL  R 4+ 5 5 5 5 5   L 5 5 5 5 5 5    Incisions c/d/I     ROS (Neurologic):  Negative except as noted above  IMAGING: No complications noted  ASSESSMENT/PLAN:  Margaret Cuevas is doing fair s/p above surgery.  She has had a successful evaluation for spinal cord stimulation.  She has failed back surgical syndrome and chronic pain syndrome.  She has passed a psychology evaluation and a trial.  I think she is an excellent candidate for spinal cord stimulator.    I discussed the planned procedure at length with the  patient, including the risks, benefits, alternatives, and indications. The risks discussed include but are not limited to bleeding, infection, need for reoperation, spinal fluid leak, stroke, vision loss, anesthetic complication, coma, paralysis, and even death. I also described in detail that improvement was not guaranteed.  The patient expressed understanding of these risks, and asked that we proceed with surgery. I described the surgery in layman's terms, and gave ample opportunity for questions, which were answered to the best of my ability.  Venetia Night Department of neurosurgery

## 2023-04-01 NOTE — Addendum Note (Signed)
Addended by: Sharlot Gowda on: 04/01/2023 03:20 PM   Modules accepted: Orders

## 2023-04-02 ENCOUNTER — Ambulatory Visit: Payer: Federal, State, Local not specified - PPO | Admitting: Psychology

## 2023-04-02 DIAGNOSIS — F4323 Adjustment disorder with mixed anxiety and depressed mood: Secondary | ICD-10-CM

## 2023-04-02 NOTE — Progress Notes (Signed)
[facility] Counselor/Therapist Progress Note  Patient ID: Margaret Cuevas, MRN: [id number],    Date: 04/02/2023  Time Spent: 45 mins start time: 1100    end time: 1145  Treatment Type: Individual Therapy  Reported Symptoms: Pt presents for session, via Caregility video, granting consent for the session.  Pt states she is in her home with no one else present; she also shares she understands the limits of virtual sessions.  I shared with pt that I am in my office with no one else here.  Mental Status Exam: Appearance:  Casual     Behavior: Appropriate  Motor: Normal  Speech/Language:  Clear and Coherent  Affect: Appropriate  Mood: normal  Thought process: normal  Thought content:   WNL  Sensory/Perceptual disturbances:   WNL  Orientation: oriented to person, place, and time/date  Attention: Good  Concentration: Good  Memory: WNL  Fund of knowledge:  Good  Insight:   Good  Judgment:  Good  Impulse Control: Good   Risk Assessment: Danger to Self:  No Self-injurious Behavior: No Danger to Others: No Duty to Warn:no Physical Aggression / Violence:No  Access to Firearms a concern: No  Gang Involvement:No   Subjective: Pt shares that she "has been doing OK I guess.  My mother-in-law passed away last week and the funeral was Wednesday.  She had been in the hospital for about 6 wks and was very sick.  She was put into a medically induced coma twice during her hospitalization and was able to come out of those.  She then took a turn for the worse and had two cardiac arrests that finally took her away.  The funeral was very nice for her and for the family."  Pt shares that her "back held up when it needed to hold up and the spinal cord stimulator helped a lot."  She has a surgery date on 04/16/23 to have the spinal cord stimulator implanted full time.  She really likes her surgeon and loves the way her talks with her and explains things to her.  She did not get all of the good  news that she wanted yesterday from the surgeon, but she is thankful that the pain will be lessened.  They are planning to have her husband's father and siblings to come to their home for Christmas.  She will spend about 4 wks recovering from her back surgery and then will have her gastric bypass surgery after that.  Pt ordered an artificial Christmas tree as a means of self care and it came and she is really happy with it.  She wants to decorate outside with the help of her sons this weekend.  Pt shares that Reuel Boom, her husband's son, has been acting out more lately and that is hard for pt given everything else that is going on with their family.  Pt shares that she has seen Dr. Vanetta Shawl recently as well and Dr. Vanetta Shawl encouraged pt to increased her Bupropion to twice daily and pt believes that will be beneficial for her.  Pt shares that her husband drove her to her parents' home for Thanksgiving; her twin brother used to have a drug problem but he is clean now and has reconciled with his family and they were all there for the holiday.  Pt's son also has a history of addiction and is also in recovery as well.  Pt shares that she has been watching TV as a self care activity.  Pt shares she continues to  take her Cymbalta 90 mg daily and believes it is helpful.  Encouraged pt to remember to use her deep breathing to help her relax and to breathe through her pain episodes.  Encouraged pt to continue with her self care activities and we will meet in 3 wks for a follow up session, due to her surgery on 12/20.  Interventions: Cognitive Behavioral Therapy  Diagnosis:Adjustment disorder with mixed anxiety and depressed mood  Plan: Treatment Plan Strengths/Abilities:  Intelligent, Intuitive, Willing to participate in therapy Treatment Preferences:  Outpatient Individual Therapy Statement of Needs:  Patient is to use CBT, mindfulness and coping skills to help manage and/or decrease symptoms associated with their  diagnosis. Symptoms:  Depressed/Irritable mood, worry, social withdrawal Problems Addressed:  Depressive thoughts, Sadness, Sleep issues, etc. Long Term Goals:  Pt to reduce overall level, frequency, and intensity of the feelings of depression/anxiety as evidenced by decreased irritability, negative self talk, and helpless feelings from 6 to 7 days/week to 0 to 1 days/week, per client report, for at least 3 consecutive months.  Progress: 30% Short Term Goals:  Pt to verbally express understanding of the relationship between feelings of depression/anxiety and their impact on thinking patterns and behaviors.  Pt to verbalize an understanding of the role that distorted thinking plays in creating fears, excessive worry, and ruminations.  Progress: 30% Target Date:  12/23/2023 Frequency:  Bi-weekly Modality:  Cognitive Behavioral Therapy Interventions by Therapist:  Therapist will use CBT, Mindfulness exercises, Coping skills and Referrals, as needed by client. Client has verbally approved this treatment plan.  Karie Kirks, [facility]

## 2023-04-06 ENCOUNTER — Encounter: Payer: Self-pay | Admitting: Neurosurgery

## 2023-04-06 ENCOUNTER — Inpatient Hospital Stay
Admission: RE | Admit: 2023-04-06 | Discharge: 2023-04-06 | Disposition: A | Discharge status: Home / Self Care | Payer: Federal, State, Local not specified - PPO | Source: Ambulatory Visit | Attending: Neurosurgery | Admitting: Neurosurgery

## 2023-04-06 VITALS — BP 105/75 | HR 74 | Temp 97.7°F | Resp 20 | Ht 65.0 in | Wt 252.5 lb

## 2023-04-06 DIAGNOSIS — Z01812 Encounter for preprocedural laboratory examination: Secondary | ICD-10-CM

## 2023-04-06 DIAGNOSIS — G894 Chronic pain syndrome: Secondary | ICD-10-CM | POA: Diagnosis not present

## 2023-04-06 DIAGNOSIS — Z01810 Encounter for preprocedural cardiovascular examination: Secondary | ICD-10-CM | POA: Diagnosis not present

## 2023-04-06 DIAGNOSIS — M961 Postlaminectomy syndrome, not elsewhere classified: Secondary | ICD-10-CM | POA: Insufficient documentation

## 2023-04-06 DIAGNOSIS — G4733 Obstructive sleep apnea (adult) (pediatric): Secondary | ICD-10-CM

## 2023-04-06 DIAGNOSIS — Z01818 Encounter for other preprocedural examination: Secondary | ICD-10-CM | POA: Insufficient documentation

## 2023-04-06 LAB — CBC
HCT: 35.6 % — ABNORMAL LOW (ref 36.0–46.0)
Hemoglobin: 11.2 g/dL — ABNORMAL LOW (ref 12.0–15.0)
MCH: 27.6 pg (ref 26.0–34.0)
MCHC: 31.5 g/dL (ref 30.0–36.0)
MCV: 87.7 fL (ref 80.0–100.0)
Platelets: 256 10*3/uL (ref 150–400)
RBC: 4.06 MIL/uL (ref 3.87–5.11)
RDW: 15 % (ref 11.5–15.5)
WBC: 5.6 10*3/uL (ref 4.0–10.5)
nRBC: 0 % (ref 0.0–0.2)

## 2023-04-06 LAB — URINALYSIS, COMPLETE (UACMP) WITH MICROSCOPIC
Bacteria, UA: NONE SEEN
Bilirubin Urine: NEGATIVE
Glucose, UA: NEGATIVE mg/dL
Hgb urine dipstick: NEGATIVE
Ketones, ur: NEGATIVE mg/dL
Leukocytes,Ua: NEGATIVE
Nitrite: NEGATIVE
Protein, ur: NEGATIVE mg/dL
Specific Gravity, Urine: 1.012 (ref 1.005–1.030)
pH: 6 (ref 5.0–8.0)

## 2023-04-06 LAB — BASIC METABOLIC PANEL
Anion gap: 8 (ref 5–15)
BUN: 18 mg/dL (ref 6–20)
CO2: 27 mmol/L (ref 22–32)
Calcium: 9.1 mg/dL (ref 8.9–10.3)
Chloride: 103 mmol/L (ref 98–111)
Creatinine, Ser: 0.75 mg/dL (ref 0.44–1.00)
GFR, Estimated: >60 mL/min (ref >60)
Glucose, Bld: 103 mg/dL — ABNORMAL HIGH (ref 70–99)
Potassium: 4.5 mmol/L (ref 3.5–5.1)
Sodium: 138 mmol/L (ref 135–145)

## 2023-04-06 LAB — TYPE AND SCREEN
ABO/RH(D): A POS
Antibody Screen: NEGATIVE

## 2023-04-06 LAB — SURGICAL PCR SCREEN
MRSA, PCR: NEGATIVE
Staphylococcus aureus: NEGATIVE

## 2023-04-06 NOTE — Patient Instructions (Addendum)
Your procedure is scheduled on: Friday, Dec 20 Report to the Registration Desk on the 1st floor of the [facility] To find out your arrival time, please call [phone] between 1PM - 3PM on: Thursday, Dec 19 If your arrival time is 6:00 am, do not arrive before that time as the [facility] entrance doors do not open until 6:00 am.  REMEMBER: Instructions that are not followed completely may result in serious medical risk, up to and including death; or upon the discretion of your surgeon and anesthesiologist your surgery may need to be rescheduled.  Do not eat food after midnight the night before surgery.  No gum chewing or hard candies.  You may however, drink CLEAR liquids up to 2 hours before you are scheduled to arrive for your surgery. Do not drink anything within 2 hours of your scheduled arrival time.  Clear liquids include: - water  - apple juice without pulp - gatorade (not RED colors) - black coffee or tea (Do NOT add milk or creamers to the coffee or tea) Do NOT drink anything that is not on this list.   One week prior to surgery:  Stop ANY OVER THE COUNTER supplements until after surgery.  You may however, continue to take Tylenol if needed for pain up until the day of surgery.   Continue taking all of your other prescription medications up until the day of surgery.  ON THE DAY OF SURGERY ONLY TAKE THESE MEDICATIONS WITH SIPS OF WATER:  busPIRone (BUSPAR) celecoxib (CELEBREX)  DULoxetine (CYMBALTA)  gabapentin (NEURONTIN) Oxycodone- 8 hours away from your arrival time    No Alcohol for 24 hours before or after surgery.  No Smoking including e-cigarettes for 24 hours before surgery.  No chewable tobacco products for at least 6 hours before surgery.  No nicotine patches on the day of surgery.  Do not use any "recreational" drugs for at least a week (preferably 2 weeks) before your surgery.  Please be advised that the combination of cocaine and anesthesia  may have negative outcomes, up to and including death. If you test positive for cocaine, your surgery will be cancelled.  On the morning of surgery brush your teeth with toothpaste and water, you may rinse your mouth with mouthwash if you wish. Do not swallow any toothpaste or mouthwash.  Use CHG Soap or wipes as directed on instruction sheet-provided for you   Do not wear jewelry, make-up, hairpins, clips or nail polish.  For welded (permanent) jewelry: bracelets, anklets, waist bands, etc.  Please have this removed prior to surgery.  If it is not removed, there is a chance that hospital personnel will need to cut it off on the day of surgery.  Do not wear lotions, powders, or perfumes.   Do not shave body hair from the neck down 48 hours before surgery.  Contact lenses, hearing aids and dentures may not be worn into surgery.  Do not bring valuables to the hospital. [facility] is not responsible for any missing/lost belongings or valuables.    Notify your doctor if there is any change in your medical condition (cold, fever, infection).  Wear comfortable clothing (specific to your surgery type) to the hospital.  After surgery, you can help prevent lung complications by doing breathing exercises.  Take deep breaths and cough every 1-2 hours. Your doctor may order a device called an Incentive Spirometer to help you take deep breaths. If you are being admitted to the hospital overnight, leave your  suitcase in the car. After surgery it may be brought to your room.  In case of increased patient census, it may be necessary for you, the patient, to continue your postoperative care in the Same Day Surgery department.  If you are being discharged the day of surgery, you will not be allowed to drive home. You will need a responsible individual to drive you home and stay with you for 24 hours after surgery.   If you are taking public transportation, you will need to have a responsible  individual with you.  Please call the Pre-admissions Testing Dept. at [phone] if you have any questions about these instructions.  Surgery Visitation Policy:  Patients having surgery or a procedure may have two visitors.  Children under the age of 78 must have an adult with them who is not the patient.    Pre-operative 5 CHG Bath Instructions   You can play a key role in reducing the risk of infection after surgery. Your skin needs to be as free of germs as possible. You can reduce the number of germs on your skin by washing with CHG (chlorhexidine gluconate) soap before surgery. CHG is an antiseptic soap that kills germs and continues to kill germs even after washing.   DO NOT use if you have an allergy to chlorhexidine/CHG or antibacterial soaps. If your skin becomes reddened or irritated, stop using the CHG and notify one of our RNs at [phone].   Please shower with the CHG soap starting 4 days before surgery using the following schedule:     Please keep in mind the following:  DO NOT shave, including legs and underarms, starting the day of your first shower.   You may shave your face at any point before/day of surgery.  Place clean sheets on your bed the day you start using CHG soap. Use a clean washcloth (not used since being washed) for each shower. DO NOT sleep with pets once you start using the CHG.   CHG Shower Instructions:  If you choose to wash your hair and private area, wash first with your normal shampoo/soap.  After you use shampoo/soap, rinse your hair and body thoroughly to remove shampoo/soap residue.  Turn the water OFF and apply about 3 tablespoons (45 ml) of CHG soap to a CLEAN washcloth.  Apply CHG soap ONLY FROM YOUR NECK DOWN TO YOUR TOES (washing for 3-5 minutes)  DO NOT use CHG soap on face, private areas, open wounds, or sores.  Pay special attention to the area where your surgery is being performed.  If you are having back surgery, having  someone wash your back for you may be helpful. Wait 2 minutes after CHG soap is applied, then you may rinse off the CHG soap.  Pat dry with a clean towel  Put on clean clothes/pajamas   If you choose to wear lotion, please use ONLY the CHG-compatible lotions on the back of this paper.     Additional instructions for the day of surgery: DO NOT APPLY any lotions, deodorants, cologne, or perfumes.   Put on clean/comfortable clothes.  Brush your teeth.  Ask your nurse before applying any prescription medications to the skin.      CHG Compatible Lotions   Aveeno Moisturizing lotion  Cetaphil Moisturizing Cream  Cetaphil Moisturizing Lotion  Clairol Herbal Essence Moisturizing Lotion, Dry Skin  Clairol Herbal Essence Moisturizing Lotion, Extra Dry Skin  Clairol Herbal Essence Moisturizing Lotion, Normal Skin  Curel Age Defying Therapeutic  Moisturizing Lotion with Alpha Hydroxy  Curel Extreme Care Body Lotion  Curel Soothing Hands Moisturizing Hand Lotion  Curel Therapeutic Moisturizing Cream, Fragrance-Free  Curel Therapeutic Moisturizing Lotion, Fragrance-Free  Curel Therapeutic Moisturizing Lotion, Original Formula  Eucerin Daily Replenishing Lotion  Eucerin Dry Skin Therapy Plus Alpha Hydroxy Crme  Eucerin Dry Skin Therapy Plus Alpha Hydroxy Lotion  Eucerin Original Crme  Eucerin Original Lotion  Eucerin Plus Crme Eucerin Plus Lotion  Eucerin TriLipid Replenishing Lotion  Keri Anti-Bacterial Hand Lotion  Keri Deep Conditioning Original Lotion Dry Skin Formula Softly Scented  Keri Deep Conditioning Original Lotion, Fragrance Free Sensitive Skin Formula  Keri Lotion Fast Absorbing Fragrance Free Sensitive Skin Formula  Keri Lotion Fast Absorbing Softly Scented Dry Skin Formula  Keri Original Lotion  Keri Skin Renewal Lotion Keri Silky Smooth Lotion  Keri Silky Smooth Sensitive Skin Lotion  Nivea Body Creamy Conditioning Oil  Nivea Body Extra Enriched Gaffer Moisturizing Lotion Nivea Crme  Nivea Skin Firming Lotion  NutraDerm 30 Skin Lotion  NutraDerm Skin Lotion  NutraDerm Therapeutic Skin Cream  NutraDerm Therapeutic Skin Lotion  ProShield Protective Hand Cream  Provon moisturizing lotion

## 2023-04-15 MED ORDER — CHLORHEXIDINE GLUCONATE 0.12 % MT SOLN
15.0000 mL | Freq: Once | OROMUCOSAL | Status: AC
  Administered 2023-04-16: 15 mL via OROMUCOSAL

## 2023-04-15 MED ORDER — CEFAZOLIN IN SODIUM CHLORIDE 2-0.9 GM/100ML-% IV SOLN
2.0000 g | Freq: Once | INTRAVENOUS | Status: DC
  Filled 2023-04-15: qty 100

## 2023-04-15 MED ORDER — ORAL CARE MOUTH RINSE: 15.0000 mL | Freq: Once | OROMUCOSAL | Status: AC

## 2023-04-15 MED ORDER — LACTATED RINGERS IV SOLN: INTRAVENOUS | Status: DC

## 2023-04-16 ENCOUNTER — Encounter
Admission: RE | Disposition: A | Discharge status: Home / Self Care | Payer: Self-pay | Source: Ambulatory Visit | Attending: Neurosurgery

## 2023-04-16 ENCOUNTER — Other Ambulatory Visit: Payer: Self-pay

## 2023-04-16 ENCOUNTER — Observation Stay
Admission: RE | Admit: 2023-04-16 | Discharge: 2023-04-18 | Disposition: A | Discharge status: Home / Self Care | Payer: Federal, State, Local not specified - PPO | Source: Ambulatory Visit | Attending: Neurosurgery | Admitting: Neurosurgery

## 2023-04-16 ENCOUNTER — Ambulatory Visit: Payer: Federal, State, Local not specified - PPO | Admitting: Urgent Care

## 2023-04-16 ENCOUNTER — Ambulatory Visit: Payer: Federal, State, Local not specified - PPO | Admitting: Anesthesiology

## 2023-04-16 ENCOUNTER — Ambulatory Visit: Payer: Federal, State, Local not specified - PPO

## 2023-04-16 ENCOUNTER — Encounter: Payer: Self-pay | Admitting: Neurosurgery

## 2023-04-16 DIAGNOSIS — Z9682 Presence of neurostimulator: Secondary | ICD-10-CM | POA: Diagnosis not present

## 2023-04-16 DIAGNOSIS — G894 Chronic pain syndrome: Secondary | ICD-10-CM | POA: Diagnosis not present

## 2023-04-16 DIAGNOSIS — M961 Postlaminectomy syndrome, not elsewhere classified: Secondary | ICD-10-CM | POA: Diagnosis not present

## 2023-04-16 DIAGNOSIS — Z01818 Encounter for other preprocedural examination: Secondary | ICD-10-CM

## 2023-04-16 DIAGNOSIS — J45909 Unspecified asthma, uncomplicated: Secondary | ICD-10-CM | POA: Diagnosis not present

## 2023-04-16 DIAGNOSIS — G8929 Other chronic pain: Principal | ICD-10-CM | POA: Diagnosis present

## 2023-04-16 DIAGNOSIS — Z87891 Personal history of nicotine dependence: Secondary | ICD-10-CM | POA: Diagnosis not present

## 2023-04-16 HISTORY — PX: THORACIC LAMINECTOMY FOR SPINAL CORD STIMULATOR: SHX6887

## 2023-04-16 HISTORY — DX: Chronic pain syndrome: G89.4

## 2023-04-16 HISTORY — DX: Postlaminectomy syndrome, not elsewhere classified: M96.1

## 2023-04-16 SURGERY — THORACIC LAMINECTOMY FOR SPINAL CORD STIMULATOR
Anesthesia: General

## 2023-04-16 MED ORDER — OXYCODONE HCL 5 MG/5ML PO SOLN: 5.0000 mg | Freq: Once | ORAL | Status: AC | PRN

## 2023-04-16 MED ORDER — BUPIVACAINE LIPOSOME 1.3 % IJ SUSP
INTRAMUSCULAR | Status: AC
  Filled 2023-04-16: qty 20

## 2023-04-16 MED ORDER — IRRISEPT - 450ML BOTTLE WITH 0.05% CHG IN STERILE WATER, USP 99.95% OPTIME
TOPICAL | Status: DC | PRN
  Administered 2023-04-16: 450 mL

## 2023-04-16 MED ORDER — TRIAMCINOLONE ACETONIDE 55 MCG/ACT NA AERO
2.0000 | INHALATION_SPRAY | Freq: Every day | NASAL | Status: DC
  Administered 2023-04-17 – 2023-04-18 (×2): 2 via NASAL
  Filled 2023-04-16: qty 21.6

## 2023-04-16 MED ORDER — 0.9 % SODIUM CHLORIDE (POUR BTL) OPTIME
TOPICAL | Status: DC | PRN
  Administered 2023-04-16: 250 mL

## 2023-04-16 MED ORDER — DULOXETINE HCL 30 MG PO CPEP
90.0000 mg | ORAL_CAPSULE | Freq: Every day | ORAL | Status: DC
  Administered 2023-04-17 – 2023-04-18 (×2): 90 mg via ORAL
  Filled 2023-04-16 (×2): qty 3

## 2023-04-16 MED ORDER — ACETAMINOPHEN 500 MG PO TABS
1000.0000 mg | ORAL_TABLET | Freq: Four times a day (QID) | ORAL | Status: DC
Start: 2023-04-16 — End: 2023-05-10
  Administered 2023-04-16 – 2023-04-18 (×5): 1000 mg via ORAL
  Filled 2023-04-16 (×7): qty 2

## 2023-04-16 MED ORDER — SURGIFLO WITH THROMBIN (HEMOSTATIC MATRIX KIT) OPTIME
TOPICAL | Status: DC | PRN
  Administered 2023-04-16: 1 via TOPICAL

## 2023-04-16 MED ORDER — VANCOMYCIN HCL IN DEXTROSE 1-5 GM/200ML-% IV SOLN
1000.0000 mg | Freq: Once | INTRAVENOUS | Status: AC
  Administered 2023-04-16: 1000 mg via INTRAVENOUS

## 2023-04-16 MED ORDER — ONDANSETRON HCL 4 MG/2ML IJ SOLN
INTRAMUSCULAR | Status: AC
  Filled 2023-04-16: qty 2

## 2023-04-16 MED ORDER — PHENOL 1.4 % MT LIQD
1.0000 | OROMUCOSAL | Status: DC | PRN
Start: 2023-04-16 — End: 2023-04-18

## 2023-04-16 MED ORDER — ACETAMINOPHEN 325 MG PO TABS: 650.0000 mg | ORAL_TABLET | ORAL | Status: DC | PRN

## 2023-04-16 MED ORDER — MIDAZOLAM HCL 2 MG/2ML IJ SOLN
INTRAMUSCULAR | Status: AC
Start: 2023-04-16 — End: ?
  Filled 2023-04-16: qty 2

## 2023-04-16 MED ORDER — DULOXETINE HCL 30 MG PO CPEP: 60.0000 mg | ORAL_CAPSULE | ORAL | Status: DC

## 2023-04-16 MED ORDER — REMIFENTANIL HCL 1 MG IV SOLR
INTRAVENOUS | Status: AC
  Filled 2023-04-16: qty 1000

## 2023-04-16 MED ORDER — MIDAZOLAM HCL 2 MG/2ML IJ SOLN
INTRAMUSCULAR | Status: DC | PRN
  Administered 2023-04-16: 2 mg via INTRAVENOUS

## 2023-04-16 MED ORDER — HYDROMORPHONE HCL 1 MG/ML IJ SOLN
0.2500 mg | INTRAMUSCULAR | Status: DC | PRN
Start: 2023-04-16 — End: 2023-04-16
  Administered 2023-04-16 (×2): 0.5 mg via INTRAVENOUS
  Administered 2023-04-16 (×2): 0.25 mg via INTRAVENOUS

## 2023-04-16 MED ORDER — SUCCINYLCHOLINE CHLORIDE 200 MG/10ML IV SOSY
PREFILLED_SYRINGE | INTRAVENOUS | Status: DC | PRN
  Administered 2023-04-16: 120 mg via INTRAVENOUS

## 2023-04-16 MED ORDER — OXYCODONE HCL 5 MG PO TABS
10.0000 mg | ORAL_TABLET | ORAL | Status: DC | PRN
Start: 2023-04-16 — End: 2023-04-18
  Administered 2023-04-16 – 2023-04-18 (×6): 10 mg via ORAL
  Filled 2023-04-16 (×9): qty 2

## 2023-04-16 MED ORDER — GABAPENTIN 300 MG PO CAPS
ORAL_CAPSULE | ORAL | Status: AC
  Filled 2023-04-16: qty 1

## 2023-04-16 MED ORDER — CHLORHEXIDINE GLUCONATE 0.12 % MT SOLN
OROMUCOSAL | Status: AC
  Filled 2023-04-16: qty 15

## 2023-04-16 MED ORDER — BUPIVACAINE HCL (PF) 0.5 % IJ SOLN
INTRAMUSCULAR | Status: AC
  Filled 2023-04-16: qty 30

## 2023-04-16 MED ORDER — SORBITOL 70 % SOLN: 30.0000 mL | Freq: Every day | Status: DC | PRN

## 2023-04-16 MED ORDER — PROPOFOL 1000 MG/100ML IV EMUL
INTRAVENOUS | Status: AC
  Filled 2023-04-16: qty 100

## 2023-04-16 MED ORDER — ACETAMINOPHEN 650 MG RE SUPP: 650.0000 mg | RECTAL | Status: DC | PRN

## 2023-04-16 MED ORDER — ACETAMINOPHEN 10 MG/ML IV SOLN: 1000.0000 mg | Freq: Once | INTRAVENOUS | Status: DC | PRN

## 2023-04-16 MED ORDER — ONDANSETRON HCL 4 MG/2ML IJ SOLN: 4.0000 mg | Freq: Four times a day (QID) | INTRAMUSCULAR | Status: DC | PRN

## 2023-04-16 MED ORDER — KETAMINE HCL 50 MG/5ML IJ SOSY
PREFILLED_SYRINGE | INTRAMUSCULAR | Status: DC | PRN
  Administered 2023-04-16: 30 mg via INTRAVENOUS
  Administered 2023-04-16: 20 mg via INTRAVENOUS

## 2023-04-16 MED ORDER — REMIFENTANIL HCL 1 MG IV SOLR
INTRAVENOUS | Status: DC | PRN
  Administered 2023-04-16: .05 ug/kg/min via INTRAVENOUS

## 2023-04-16 MED ORDER — HYDROMORPHONE HCL 1 MG/ML IJ SOLN
INTRAMUSCULAR | Status: AC
  Filled 2023-04-16: qty 1

## 2023-04-16 MED ORDER — GABAPENTIN 300 MG PO CAPS: 300.0000 mg | ORAL_CAPSULE | Freq: Once | ORAL | Status: DC

## 2023-04-16 MED ORDER — CELECOXIB 100 MG PO CAPS
100.0000 mg | ORAL_CAPSULE | Freq: Two times a day (BID) | ORAL | Status: DC
  Administered 2023-04-16 – 2023-04-18 (×4): 100 mg via ORAL
  Filled 2023-04-16 (×4): qty 1

## 2023-04-16 MED ORDER — SODIUM CHLORIDE (PF) 0.9 % IJ SOLN
INTRAMUSCULAR | Status: DC | PRN
  Administered 2023-04-16: 60 mL

## 2023-04-16 MED ORDER — SODIUM CHLORIDE 0.9% FLUSH
3.0000 mL | Freq: Two times a day (BID) | INTRAVENOUS | Status: DC
  Administered 2023-04-16 – 2023-04-18 (×4): 3 mL via INTRAVENOUS

## 2023-04-16 MED ORDER — PROPOFOL 10 MG/ML IV BOLUS
INTRAVENOUS | Status: DC | PRN
  Administered 2023-04-16: 100 ug/kg/min via INTRAVENOUS
  Administered 2023-04-16: 150 mg via INTRAVENOUS

## 2023-04-16 MED ORDER — ACETAMINOPHEN 500 MG PO TABS
ORAL_TABLET | ORAL | Status: AC
  Filled 2023-04-16: qty 2

## 2023-04-16 MED ORDER — MORPHINE SULFATE (PF) 2 MG/ML IV SOLN
1.0000 mg | INTRAVENOUS | Status: AC | PRN
  Administered 2023-04-16 – 2023-04-17 (×2): 1 mg via INTRAVENOUS
  Filled 2023-04-16 (×3): qty 1

## 2023-04-16 MED ORDER — FENTANYL CITRATE (PF) 100 MCG/2ML IJ SOLN
INTRAMUSCULAR | Status: DC | PRN
  Administered 2023-04-16 (×2): 50 ug via INTRAVENOUS

## 2023-04-16 MED ORDER — SUCCINYLCHOLINE CHLORIDE 200 MG/10ML IV SOSY
PREFILLED_SYRINGE | INTRAVENOUS | Status: AC
  Filled 2023-04-16: qty 10

## 2023-04-16 MED ORDER — LIDOCAINE HCL (CARDIAC) PF 100 MG/5ML IV SOSY
PREFILLED_SYRINGE | INTRAVENOUS | Status: DC | PRN
  Administered 2023-04-16: 100 mg via INTRAVENOUS

## 2023-04-16 MED ORDER — VANCOMYCIN HCL IN DEXTROSE 1-5 GM/200ML-% IV SOLN
INTRAVENOUS | Status: AC
  Filled 2023-04-16: qty 200

## 2023-04-16 MED ORDER — PANTOPRAZOLE SODIUM 40 MG PO TBEC
80.0000 mg | DELAYED_RELEASE_TABLET | Freq: Every day | ORAL | Status: DC
  Administered 2023-04-17 – 2023-04-18 (×2): 80 mg via ORAL
  Filled 2023-04-16 (×2): qty 2

## 2023-04-16 MED ORDER — BUSPIRONE HCL 15 MG PO TABS
7.5000 mg | ORAL_TABLET | Freq: Two times a day (BID) | ORAL | Status: DC
  Administered 2023-04-16 – 2023-04-18 (×4): 7.5 mg via ORAL
  Filled 2023-04-16 (×4): qty 1

## 2023-04-16 MED ORDER — MENTHOL 3 MG MT LOZG: 1.0000 | LOZENGE | OROMUCOSAL | Status: DC | PRN

## 2023-04-16 MED ORDER — POLYETHYLENE GLYCOL 3350 17 G PO PACK: 17.0000 g | PACK | Freq: Every day | ORAL | Status: DC | PRN

## 2023-04-16 MED ORDER — KETAMINE HCL 50 MG/5ML IJ SOSY
PREFILLED_SYRINGE | INTRAMUSCULAR | Status: AC
  Filled 2023-04-16: qty 5

## 2023-04-16 MED ORDER — ENOXAPARIN SODIUM 40 MG/0.4ML IJ SOSY
40.0000 mg | PREFILLED_SYRINGE | INTRAMUSCULAR | Status: DC
  Administered 2023-04-17 – 2023-04-18 (×2): 40 mg via SUBCUTANEOUS
  Filled 2023-04-16 (×2): qty 0.4

## 2023-04-16 MED ORDER — MAGNESIUM CITRATE PO SOLN: 1.0000 | Freq: Once | ORAL | Status: DC | PRN

## 2023-04-16 MED ORDER — SUCRALFATE 1 G PO TABS
1.0000 g | ORAL_TABLET | Freq: Three times a day (TID) | ORAL | Status: DC
  Administered 2023-04-16 – 2023-04-18 (×5): 1 g via ORAL
  Filled 2023-04-16 (×6): qty 1

## 2023-04-16 MED ORDER — FENTANYL CITRATE (PF) 100 MCG/2ML IJ SOLN
INTRAMUSCULAR | Status: AC
  Filled 2023-04-16: qty 2

## 2023-04-16 MED ORDER — SODIUM CHLORIDE 0.9% FLUSH: 3.0000 mL | INTRAVENOUS | Status: DC | PRN

## 2023-04-16 MED ORDER — SENNA 8.6 MG PO TABS
1.0000 | ORAL_TABLET | Freq: Two times a day (BID) | ORAL | Status: DC
  Administered 2023-04-16 – 2023-04-18 (×4): 8.6 mg via ORAL
  Filled 2023-04-16 (×4): qty 1

## 2023-04-16 MED ORDER — DROPERIDOL 2.5 MG/ML IJ SOLN: 0.6250 mg | Freq: Once | INTRAMUSCULAR | Status: DC | PRN

## 2023-04-16 MED ORDER — PROPOFOL 10 MG/ML IV BOLUS
INTRAVENOUS | Status: AC
  Filled 2023-04-16: qty 20

## 2023-04-16 MED ORDER — GABAPENTIN 300 MG PO CAPS
ORAL_CAPSULE | ORAL | Status: AC
  Filled 2023-04-16: qty 4

## 2023-04-16 MED ORDER — SENNA 8.6 MG PO TABS: 1.0000 | ORAL_TABLET | ORAL | Status: DC

## 2023-04-16 MED ORDER — ACETAMINOPHEN 500 MG PO TABS
1000.0000 mg | ORAL_TABLET | Freq: Once | ORAL | Status: AC
Start: 2023-04-16 — End: 2023-04-16
  Administered 2023-04-16: 1000 mg via ORAL

## 2023-04-16 MED ORDER — ONDANSETRON HCL 4 MG/2ML IJ SOLN
INTRAMUSCULAR | Status: DC | PRN
  Administered 2023-04-16: 4 mg via INTRAVENOUS

## 2023-04-16 MED ORDER — OXYCODONE HCL 5 MG PO TABS
ORAL_TABLET | ORAL | Status: AC
  Filled 2023-04-16: qty 1

## 2023-04-16 MED ORDER — GABAPENTIN 400 MG PO CAPS
1200.0000 mg | ORAL_CAPSULE | Freq: Three times a day (TID) | ORAL | Status: DC
  Administered 2023-04-16 – 2023-04-18 (×6): 1200 mg via ORAL
  Filled 2023-04-16 (×6): qty 3

## 2023-04-16 MED ORDER — BUPIVACAINE-EPINEPHRINE (PF) 0.5% -1:200000 IJ SOLN
INTRAMUSCULAR | Status: DC | PRN
  Administered 2023-04-16: 10 mL

## 2023-04-16 MED ORDER — DULOXETINE HCL 30 MG PO CPEP: 30.0000 mg | ORAL_CAPSULE | Freq: Every day | ORAL | Status: DC

## 2023-04-16 MED ORDER — CEFAZOLIN SODIUM-DEXTROSE 2-4 GM/100ML-% IV SOLN
INTRAVENOUS | Status: AC
  Filled 2023-04-16: qty 100

## 2023-04-16 MED ORDER — DOCUSATE SODIUM 100 MG PO CAPS
100.0000 mg | ORAL_CAPSULE | Freq: Two times a day (BID) | ORAL | Status: DC
  Administered 2023-04-16 – 2023-04-18 (×4): 100 mg via ORAL
  Filled 2023-04-16 (×4): qty 1

## 2023-04-16 MED ORDER — CEFAZOLIN SODIUM-DEXTROSE 2-4 GM/100ML-% IV SOLN
2.0000 g | INTRAVENOUS | Status: AC
  Administered 2023-04-16: 2 g via INTRAVENOUS

## 2023-04-16 MED ORDER — ONDANSETRON HCL 4 MG PO TABS: 4.0000 mg | ORAL_TABLET | Freq: Four times a day (QID) | ORAL | Status: DC | PRN

## 2023-04-16 MED ORDER — OXYCODONE HCL 5 MG PO TABS
5.0000 mg | ORAL_TABLET | Freq: Once | ORAL | Status: AC | PRN
  Administered 2023-04-16: 5 mg via ORAL

## 2023-04-16 MED ORDER — OXYCODONE HCL 5 MG PO TABS
5.0000 mg | ORAL_TABLET | ORAL | Status: DC | PRN
  Administered 2023-04-16 – 2023-04-17 (×2): 5 mg via ORAL
  Filled 2023-04-16: qty 1

## 2023-04-16 MED ORDER — LIDOCAINE HCL (PF) 2 % IJ SOLN
INTRAMUSCULAR | Status: AC
  Filled 2023-04-16: qty 5

## 2023-04-16 MED ORDER — CYCLOBENZAPRINE HCL 10 MG PO TABS
10.0000 mg | ORAL_TABLET | Freq: Three times a day (TID) | ORAL | Status: DC | PRN
  Administered 2023-04-16 – 2023-04-17 (×4): 10 mg via ORAL
  Filled 2023-04-16 (×5): qty 1

## 2023-04-16 MED ORDER — DEXAMETHASONE SODIUM PHOSPHATE 10 MG/ML IJ SOLN
INTRAMUSCULAR | Status: DC | PRN
  Administered 2023-04-16: 10 mg via INTRAVENOUS

## 2023-04-16 MED ORDER — DEXAMETHASONE SODIUM PHOSPHATE 10 MG/ML IJ SOLN
INTRAMUSCULAR | Status: AC
  Filled 2023-04-16: qty 1

## 2023-04-16 SURGICAL SUPPLY — 39 items
BUR NEURO DRILL SOFT 3.0X3.8M (BURR) ×1 IMPLANT
DERMABOND ADVANCED .7 DNX12 (GAUZE/BANDAGES/DRESSINGS) ×2 IMPLANT
DRAPE C ARM PK CFD 31 SPINE (DRAPES) ×1 IMPLANT
DRAPE LAPAROTOMY 100X77 ABD (DRAPES) ×1 IMPLANT
DRSG OPSITE POSTOP 4X6 (GAUZE/BANDAGES/DRESSINGS) IMPLANT
ELECT REM PT RETURN 9FT ADLT (ELECTROSURGICAL) ×1
ELECTRODE REM PT RTRN 9FT ADLT (ELECTROSURGICAL) ×1 IMPLANT
FEE INTRAOP CADWELL SUPPLY NCS (MISCELLANEOUS) IMPLANT
FEE INTRAOP MONITOR IMPULS NCS (MISCELLANEOUS) IMPLANT
GLOVE BIOGEL PI IND STRL 6.5 (GLOVE) ×1 IMPLANT
GLOVE SURG SYN 6.5 ES PF (GLOVE) ×1 IMPLANT
GLOVE SURG SYN 6.5 PF PI (GLOVE) ×1 IMPLANT
GLOVE SURG SYN 8.5 E (GLOVE) ×3 IMPLANT
GLOVE SURG SYN 8.5 PF PI (GLOVE) ×3 IMPLANT
GOWN SRG LRG LVL 4 IMPRV REINF (GOWNS) ×1 IMPLANT
GOWN SRG XL LVL 3 NONREINFORCE (GOWNS) ×1 IMPLANT
INTRAOP CADWELL SUPPLY FEE NCS (MISCELLANEOUS) ×1
INTRAOP MONITOR FEE IMPULS NCS (MISCELLANEOUS) ×1
JET LAVAGE IRRISEPT WOUND (IRRIGATION / IRRIGATOR) ×1
KIT IPG NEUROSTIM OMNIA 3000 (Stimulator) IMPLANT
KIT LEAD NEUROSTIM 50 (Lead) IMPLANT
KIT PASSING ELEVATOR NEUROSTIM (MISCELLANEOUS) IMPLANT
KIT SPINAL PRONEVIEW (KITS) ×1 IMPLANT
KIT TUNNELER SPINAL CORD 35 (KITS) IMPLANT
LAVAGE JET IRRISEPT WOUND (IRRIGATION / IRRIGATOR) ×1 IMPLANT
MANIFOLD NEPTUNE II (INSTRUMENTS) ×1 IMPLANT
MARKER SKIN DUAL TIP RULER LAB (MISCELLANEOUS) ×1 IMPLANT
NDL SAFETY ECLIPSE 18X1.5 (NEEDLE) ×1 IMPLANT
NS IRRIG 500ML POUR BTL (IV SOLUTION) ×1 IMPLANT
PACK LAMINECTOMY ARMC (PACKS) ×1 IMPLANT
STAPLER SKIN PROX 35W (STAPLE) ×1 IMPLANT
SURGIFLO W/THROMBIN 8M KIT (HEMOSTASIS) ×1 IMPLANT
SUT SILK 2 0SH CR/8 30 (SUTURE) ×1 IMPLANT
SUT STRATA 3-0 15 PS-2 (SUTURE) ×1 IMPLANT
SUT VIC AB 0 CT1 18XCR BRD 8 (SUTURE) ×1 IMPLANT
SUT VIC AB 2-0 CT1 18 (SUTURE) ×1 IMPLANT
SYR 10ML LL (SYRINGE) IMPLANT
SYR 30ML LL (SYRINGE) ×2 IMPLANT
TRAP FLUID SMOKE EVACUATOR (MISCELLANEOUS) ×1 IMPLANT

## 2023-04-16 NOTE — Plan of Care (Signed)
  Problem: Education: Goal: Knowledge of General Education information will improve Description: Including pain rating scale, medication(s)/side effects and non-pharmacologic comfort measures Outcome: Progressing   Problem: Pain Management: Goal: General experience of comfort will improve Outcome: Progressing   Problem: Safety: Goal: Ability to remain free from injury will improve Outcome: Progressing   Problem: Skin Integrity: Goal: Risk for impaired skin integrity will decrease Outcome: Progressing

## 2023-04-16 NOTE — Interval H&P Note (Signed)
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  The various methods of treatment have been discussed with the patient and family. After consideration of risks, benefits and other options for treatment, the patient has consented to  Procedure(s): THORACIC LAMINECTOMY FOR SPINAL CORD STIMULATOR PLACEMENT (NEVRO) (N/A) as a surgical intervention.  The patient's history has been reviewed, patient examined, no change in status, stable for surgery.  I have reviewed the patient's chart and labs.  Questions were answered to the patient's satisfaction.    Heart sounds normal no MRG. Chest Clear to Auscultation Bilaterally.   Margaret Cuevas

## 2023-04-16 NOTE — Discharge Instructions (Addendum)
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 Increase protein intake to promote wound healing.  Recommended activity: no lifting, driving, or strenuous exercise for 4 weeks . You should walk multiple times per day  Special Instructions  No straining, no heavy lifting > 10lbs x 4 weeks.  Keep incision area clean and dry. May shower in 2 days. No baths or pools for 6 weeks.  Please remove dressing tomorrow, no need to apply a bandage afterwards  You have no sutures to remove, the skin is closed with adhesive  Please take pain medications as directed. Take a stool softener if on pain medications   Please Report any of the following: Nausea or Vomiting, Temperature is greater than 101.68F (38.1C) degrees, Dizziness, Abdominal Pain, Difficulty Breathing or Shortness of Breath, Inability to Eat, drink Fluids, or Take medications, Bleeding, swelling, or drainage from surgical incision sites, New numbness or weakness, and Bowel or bladder dysfunction to the neurosurgeon on call. How to contact us:  If you have any questions/concerns before or after surgery, you can reach [place] at [phone], or you can send a mychart message. We can be reached by phone or mychart 8am-4pm, Monday-Friday.  *Please note: Calls after 4pm are forwarded to a third party answering service. Mychart messages are not routinely monitored during evenings, weekends, and holidays. Please call our office to contact the answering service for urgent concerns during non-business hours.   Additional Follow up appointments Please follow up with Drake Leach PA-C as scheduled in 2-3 weeks   Please see below for scheduled appointments:  Future Appointments  Date Time Provider Department Center  04/22/2023 11:00 AM Karie Kirks, [facility] None  04/30/2023  9:30 AM Drake Leach, PA-C [facility] None  05/27/2023 10:45 AM Venetia Night, MD [facility] None  06/16/2023 11:00 AM Neysa Hotter, MD [facility] None

## 2023-04-16 NOTE — Op Note (Signed)
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 The patient had a successful trial for spinal cord stimulation, so was consented for placement of a permanent device   Findings: successful placement of a Nevro spinal cord stimulator.   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     Preoperative Note:    Risks of surgery discussed in clinic.   Operative Note:      The patient was then brought from the preoperative center with intravenous access established.  The patient underwent general anesthesia and endotracheal tube intubation, then was rotated on the [facility] table where all pressure points were appropriately padded.  An incision was marked with flouroscopy at T10/11, and on the left flank. The skin was then thoroughly cleansed.  Perioperative antibiotic prophylaxis was administered.  Sterile prep and drapes were then applied and a timeout was then observed.     Once this was complete an incision was opened with the use of a #10 blade knife in the midline at the thoracic incision.  The paraspinus muscled were subperiosteally dissected until the laminae of T10 and T11 were visualized. Flouroscopy was used to confirm the level. A self-retaining retractor was placed.   The rongeur was used to remove the spinous process of T10.  The drill was used to thin the bone until the ligamentum flavum was visualized.  The ligamentum was then removed and the dura visualized. This was widened until placement of the paddle lead was possible.     The lead was then advanced to the T8/9 disc space at the top of the lead.  The lead was secured with a 2-0 silk suture.     The incision on the flank  was then opened and a pocket formed until it was large enough for the pulse generator.  The tunneler was used to connect between the pocket and the incision.  The lead was inserted into the tunneler and tunneled to the flank.  The leads were attached to the IPG and impedances checked.  The leads were then tightened.  The IPG was then inserted into the pouch.   Both sites were irrigated.  The wounds were closed in layers with 0 and 2-0 vicryl.  The skin was approximated with monocryl. A sterile dressing was applied.   Monitoring was stable throughout.   Patient was then rotated back to the preoperative bed awakened from anesthesia and taken to recovery. All counts are correct in this case.   I performed the entire procedure with the assistance of Manning Charity PA as an Designer, television/film set. An assistant was required for this procedure due to the complexity.  The assistant provided assistance in tissue manipulation and suction, and was required for the successful and safe performance of the procedure. I performed the critical portions of the procedure.      Venetia Night MD

## 2023-04-16 NOTE — Transfer of Care (Signed)
Immediate Anesthesia Transfer of Care Note  Patient: Margaret Cuevas  Procedure(s) Performed: THORACIC LAMINECTOMY FOR SPINAL CORD STIMULATOR PLACEMENT [facility])  Patient Location: PACU  Anesthesia Type:General  Level of Consciousness: awake, alert , and oriented  Airway & Oxygen Therapy: Patient Spontanous Breathing and Patient connected to face mask oxygen  Post-op Assessment: Report given to RN and Post -op Vital signs reviewed and stable  Post vital signs: Reviewed and stable  Last Vitals:  Vitals Value Taken Time  BP 139/95 04/16/23 1615  Temp 35.3 C 04/16/23 1615  Pulse 88 04/16/23 1621  Resp 16 04/16/23 1615  SpO2 100 % 04/16/23 1621  Vitals shown include unfiled device data.  Last Pain:  Vitals:   04/16/23 1615  TempSrc:   PainSc: Asleep         Complications: No notable events documented.

## 2023-04-16 NOTE — Plan of Care (Signed)
  Problem: Education: Goal: Knowledge of General Education information will improve Description: Including pain rating scale, medication(s)/side effects and non-pharmacologic comfort measures Outcome: Progressing   Problem: Clinical Measurements: Goal: Ability to maintain clinical measurements within normal limits will improve Outcome: Progressing   Problem: Activity: Goal: Risk for activity intolerance will decrease Outcome: Progressing   Problem: Nutrition: Goal: Adequate nutrition will be maintained Outcome: Progressing   Problem: Elimination: Goal: Will not experience complications related to bowel motility Outcome: Progressing   

## 2023-04-16 NOTE — Anesthesia Procedure Notes (Signed)
Procedure Name: Intubation Date/Time: 04/16/2023 2:02 PM  Performed by: Elisabeth Pigeon, CRNAPre-anesthesia Checklist: Patient identified, Patient being monitored, Timeout performed, Emergency Drugs available and Suction available Patient Re-evaluated:Patient Re-evaluated prior to induction Oxygen Delivery Method: Circle system utilized Preoxygenation: Pre-oxygenation with 100% oxygen Induction Type: IV induction Ventilation: Mask ventilation without difficulty Laryngoscope Size: Mac, 3 and McGrath Grade View: Grade I Tube type: Oral Tube size: 7.0 mm Number of attempts: 1 Airway Equipment and Method: Stylet Placement Confirmation: ETT inserted through vocal cords under direct vision, positive ETCO2 and breath sounds checked- equal and bilateral Secured at: 21 cm Tube secured with: Tape Dental Injury: Teeth and Oropharynx as per pre-operative assessment

## 2023-04-16 NOTE — Anesthesia Preprocedure Evaluation (Addendum)
Anesthesia Evaluation  Patient identified by MRN, date of birth, ID band Patient awake    Reviewed: Allergy & Precautions, NPO status , Patient's Chart, lab work & pertinent test results  History of Anesthesia Complications (+) history of anesthetic complications (awareness with leg surgery 2017)  Airway Mallampati: II  TM Distance: >3 FB Neck ROM: full    Dental no notable dental hx.    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 syndrome  Neuromuscular disease    GI/Hepatic Neg liver ROS, hiatal hernia,GERD  Controlled,,  Endo/Other    Class 3 obesity  Renal/GU      Musculoskeletal  (+) Arthritis , Osteoarthritis,  Fibromyalgia -, narcotic dependent  Abdominal  (+) + obese  Peds  Hematology  (+) Blood dyscrasia, anemia   Anesthesia Other Findings Past Medical History: No date: Anemia No date: Anginal pain (HCC)     Comment:  work up was benign No date: Anxiety No date: Arthritis     Comment:  right hip No date: Asthma     Comment:  seasonal No date: Chest pain No date: Complication of anesthesia     Comment:  woke up during surgery x 2 No date: Fibromyalgia No date: GERD (gastroesophageal reflux disease) No date: Hiatal hernia 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 date: Lipoma of back No date: Lumbar disc herniation No date: Lumbar radiculopathy No date: Obesity No date: OSA on CPAP No date: Pneumonia  Past Surgical History: No date: ABDOMINAL HYSTERECTOMY No date: ANKLE FRACTURE SURGERY     Comment:  plates and screws placed on both sides No date: APPENDECTOMY No date: BACK SURGERY      Comment:  lumbar L4-5 No date: CESAREAN SECTION No date: CHOLECYSTECTOMY 05/04/2022: COLONOSCOPY; N/A     Comment:  Procedure: COLONOSCOPY;  Surgeon: Jaynie Collins,              DO;  Location: [facility] ENDOSCOPY;  Service:               Gastroenterology;  Laterality: N/A; No date: EAR TUBE REMOVAL 05/04/2022: ESOPHAGOGASTRODUODENOSCOPY; N/A     Comment:  Procedure: ESOPHAGOGASTRODUODENOSCOPY (EGD);  Surgeon:               Jaynie Collins, DO;  Location: [facility] ENDOSCOPY;                Service: Gastroenterology;  Laterality: N/A; 2006: GASTRIC BYPASS No date: ORIF FINGER / THUMB FRACTURE     Comment:  screws in thumb 08/07/2022: Right RIGHT L4-5 FAR LATERAL DISCECTOMY     Comment:  Dr Myer Haff  BMI    Body Mass Index: 41.32 kg/m      Reproductive/Obstetrics negative OB ROS                             Anesthesia Physical Anesthesia Plan  ASA: 3  Anesthesia Plan: General ETT   Post-op Pain Management: Toradol IV (intra-op)*, Ofirmev IV (intra-op)* and Ketamine IV*   Induction: Intravenous  PONV Risk Score and Plan: 3 and Ondansetron, Dexamethasone, Midazolam and Treatment may vary due to age or medical condition  Airway  Management Planned: Oral ETT  Additional Equipment:   Intra-op Plan:   Post-operative Plan: Extubation in OR  Informed Consent: I have reviewed the patients History and Physical, chart, labs and discussed the procedure including the risks, benefits and alternatives for the proposed anesthesia with the patient or authorized representative who has indicated his/her understanding and acceptance.     Dental Advisory Given  Plan Discussed with: Anesthesiologist, CRNA and Surgeon  Anesthesia Plan Comments: (Patient consented for risks of anesthesia including but not limited to:  - adverse reactions to medications - damage to eyes, teeth, lips or other oral mucosa - nerve damage due to positioning  - sore throat or  hoarseness - Damage to heart, brain, nerves, lungs, other parts of body or loss of life  Patient voiced understanding.)        Anesthesia Quick Evaluation

## 2023-04-17 DIAGNOSIS — M961 Postlaminectomy syndrome, not elsewhere classified: Secondary | ICD-10-CM | POA: Diagnosis not present

## 2023-04-17 DIAGNOSIS — G894 Chronic pain syndrome: Secondary | ICD-10-CM | POA: Diagnosis not present

## 2023-04-17 LAB — GLUCOSE, CAPILLARY: Glucose-Capillary: 131 mg/dL — ABNORMAL HIGH (ref 70–99)

## 2023-04-17 MED ORDER — SODIUM CHLORIDE 0.9 % IV BOLUS
500.0000 mL | Freq: Once | INTRAVENOUS | Status: AC
  Administered 2023-04-17: 500 mL via INTRAVENOUS

## 2023-04-17 MED ORDER — OXYCODONE HCL 5 MG PO TABS
5.0000 mg | ORAL_TABLET | Freq: Once | ORAL | Status: AC
  Administered 2023-04-17: 5 mg via ORAL
  Filled 2023-04-17: qty 1

## 2023-04-17 NOTE — Progress Notes (Signed)
Attending Progress Note  History: Margaret Cuevas is here for spinal cord stimulator placement.  She has a history of chronic pain.  Was admitted for thoracic spinal cord stimulator placement with IPG placement as well.  Postoperative day 1: Hypotensive and tachycardic this morning, limited intake.  Significant pain in her back and in the IPG site.  She is not having any weakness but does have significant pain.  Not currently controlled on her medication plan.  Physical Exam: Vitals:   04/17/23 0858 04/17/23 0900  BP: (!) 94/56 119/82  Pulse: (!) 119 (!) 115  Resp: 18   Temp: 99.1 F (37.3 C)   SpO2: 96%     AA Ox3 CNI  Strength: Her exam is somewhat limited by pain Side Iliopsoas Quads Hamstring PF DF EHL  R 4+ 5 5 5 5 5   L 5 5 5 5 5 5    Dressings are in place, not saturated. Data:  No results for input(s): "NA", "K", "CL", "CO2", "BUN", "CREATININE", "LABGLOM", "GLUCOSE", "CALCIUM" in the last 168 hours. No results for input(s): "AST", "ALT", "ALKPHOS" in the last 168 hours.  Invalid input(s): "TBILI"   No results for input(s): "WBC", "HGB", "HCT", "PLT" in the last 168 hours. No results for input(s): "APTT", "INR" in the last 168 hours.       Other tests/results:  DG Thoracic Spine 2 View Result Date: 04/16/2023 CLINICAL DATA:  [id number] Surgery, elective [id number] EXAM: THORACIC SPINE 2 VIEWS COMPARISON:  None Available. FINDINGS: Two frontal fluoroscopic patch that 2 fluoroscopic spot views of the thoracic spine obtained in the operating room. Initial image demonstrates instruments localizing over the lower aspect of T8 and at the T10 level. Subsequent placement of spinal stimulator with tips at the T8-T9 level. Fluoroscopy time 7 seconds. Dose 2.4495 mGy. IMPRESSION: Intraoperative fluoroscopy during spinal stimulator placement. Electronically Signed   By: Narda Rutherford M.D.   On: 04/16/2023 17:29   DG C-Arm 1-60 Min-No Report Result Date: 04/16/2023 Fluoroscopy was  utilized by the requesting physician.  No radiographic interpretation.   DG C-Arm 1-60 Min-No Report Result Date: 04/16/2023 Fluoroscopy was utilized by the requesting physician.  No radiographic interpretation.     Assessment/Plan:  Margaret Cuevas is a 47 year old woman with a history of chronic pain.  She had a thoracic spinal cord stimulator placed successfully yesterday with IPG placement as well.  The procedure itself was uncomplicated.  She was admitted to the floor for 24-hour observation.  This morning she continues to have significant pain at the incision sites, not currently controlled on her pain medication.  Has had some breakthrough orders.  She has not yet been up with physical therapy or upper out of her bed yet this morning.  She does have some hypotension this morning with tachycardia, likely in need of fluid resuscitation so we will give her a 500 cc bolus.  Will also plan on starting an incentive spirometer given some postoperative wheezing.  - mobilize - pain control - DVT prophylaxis - PTOT  Lovenia Kim, MD/MSCR Department of Neurosurgery

## 2023-04-17 NOTE — Progress Notes (Signed)
Orthopedic Tech Progress Note Patient Details:  Margaret Cuevas [birth year] [id number]  Order called into [facility]'s answering service for a TLSO. I did call it in as a stat order as this pt is admitted for a 24 hr observation and a routine order would not arrive until Monday when [facility] office opens.  Patient ID: Margaret Cuevas, female   DOB: 04/02/1976, 47 y.o.   MRN: [id number]  Docia Furl 04/17/2023, 11:17 AM

## 2023-04-17 NOTE — Evaluation (Signed)
Occupational Therapy Evaluation Patient Details Name: Margaret Cuevas MRN: [id number] DOB: 1975-08-09 Today's Date: 04/17/2023   History of Present Illness 47yo female s/p thoracic spinal cord stimulator placement on 04/16/23. PMHx includes chronic pain syndrome, and failed back surgical syndrome, hx 11/09/22 XLIF L4-5 XLIF and posterior spinal fusion, right L5-S1 far lateral foraminotomy, 08/07/2022 R L4/5 far lateral discectomy, 12/22/22 L4-5 XLIF and posterior spinal fusion, right L5-S1 far lateral foraminotomy.   Clinical Impression   Pt was seen for OT evaluation this date and cotx with PT to optimize safety with ADL mobility 2/2 recent uncontrolled pain. Prior to hospital admission, pt was very pain limited 2/2 back pain leading to need for moderate assist for ADL tasks, assist for all IADL, and pt had to stop working and driving back in April, 4098 2/2 pain and impaired RLE strength and sensation when pain would radiate. Pt lives with her spouse who can provide PRN assist. Pt required CGA-MIN A for ADL transfers, CGA for ADL mobility using RW, and MIN-MOD A for LB ADL tasks. Min A for brace mgt. Pt/spouse educated in back precautions and how to maintain, self care skills, and compression stocking mgt with handout to support recall and carryover.  Pt presents to acute OT demonstrating impaired ADL performance and functional mobility 2/2 decreased strength, back pain, activity tolerance, and balance (See OT problem list for additional functional deficits). Pt would benefit from skilled OT services to address noted impairments and functional limitations (see below for any additional details) in order to maximize safety and independence while minimizing falls risk and caregiver burden.     If plan is discharge home, recommend the following: A little help with walking and/or transfers;A lot of help with bathing/dressing/bathroom;Assistance with cooking/housework;Assist for transportation;Help with stairs or  ramp for entrance    Functional Status Assessment  Patient has had a recent decline in their functional status and demonstrates the ability to make significant improvements in function in a reasonable and predictable amount of time.  Equipment Recommendations  None recommended by OT    Recommendations for Other Services       Precautions / Restrictions Precautions Precautions: Fall;Back Precaution Booklet Issued: Yes (comment) Required Braces or Orthoses: Spinal Brace Spinal Brace: Thoracolumbosacral orthotic;Applied in sitting position;Other (comment) Spinal Brace Comments: TLSO for comfort when ambulating, 1-2 weeks      Mobility Bed Mobility Overal bed mobility: Needs Assistance Bed Mobility: Sit to Sidelying, Rolling Rolling: Contact guard assist       Sit to sidelying: Min assist General bed mobility comments: MIN A for BLE mgt from PT    Transfers Overall transfer level: Needs assistance Equipment used: 1 person hand held assist, Rolling walker (2 wheels) Transfers: Sit to/from Stand Sit to Stand: Contact guard assist, Min assist           General transfer comment: Pt stood promptly wiht spouse assist      Balance Overall balance assessment: Needs assistance Sitting-balance support: No upper extremity supported, Feet supported Sitting balance-Leahy Scale: Good     Standing balance support: Bilateral upper extremity supported Standing balance-Leahy Scale: Fair                             ADL either performed or assessed with clinical judgement   ADL Overall ADL's : Needs assistance/impaired  General ADL Comments: Pt currently requires MIN-MOD A for LB ADL tasks and CGA for ADL mobility with RW. PRN MIN A for brace mgt. Spouse able to provide needed level of assist.     Vision         Perception         Praxis         Pertinent Vitals/Pain Pain Assessment Pain Assessment:  0-10 Pain Score: 8  Pain Location: incision site 5/10 at rest, increasing to 8/10 Pain Descriptors / Indicators: Throbbing, Burning Pain Intervention(s): Monitored during session, Premedicated before session, Repositioned     Extremity/Trunk Assessment Upper Extremity Assessment Upper Extremity Assessment: Overall WFL for tasks assessed   Lower Extremity Assessment Lower Extremity Assessment: Defer to PT evaluation   Cervical / Trunk Assessment Cervical / Trunk Assessment: Back Surgery   Communication Communication Communication: No apparent difficulties   Cognition Arousal: Alert Behavior During Therapy: WFL for tasks assessed/performed Overall Cognitive Status: Within Functional Limits for tasks assessed                                       General Comments       Exercises Other Exercises Other Exercises: Pt/spouse educated in back precautions and how to maintain, provided with handout to support recall and carryover   Shoulder Instructions      Home Living Family/patient expects to be discharged to:: Private residence Living Arrangements: Spouse/significant other Available Help at Discharge: Family;Available 24 hours/day Type of Home: House Home Access: Stairs to enter [employer] of Steps: 5 with rails side door *main she uses, 2 without rails Entrance Stairs-Rails: Right;Left Home Layout: Able to live on main level with bedroom/bathroom;Two level     Bathroom Shower/Tub: Producer, television/film/video: Handicapped height     Home Equipment: Teacher, English as a foreign language (2 wheels);Rollator (4 wheels);Adaptive equipment Adaptive Equipment: Reacher Additional Comments: adjustable bed      Prior Functioning/Environment Prior Level of Function : Needs assist             Mobility Comments: uses 2WW for getting to bathroom, rollator in the rest of the house, 2WW for appts, 3 falls in 67mo 2/2 R side goes numb and gives out ADLs  Comments: Stopped working Nov 30th, moderate assist for ADL 2/2 pain, has modified home to minimize bending, ; spouse does grocery shopping, haven't driven since April, hasn't worked since April        OT Problem List: Pain;Decreased activity tolerance;Impaired balance (sitting and/or standing);Decreased knowledge of use of DME or AE;Decreased strength      OT Treatment/Interventions: Self-care/ADL training;Therapeutic exercise;Therapeutic activities;DME and/or AE instruction;Patient/family education;Balance training;Energy conservation    OT Goals(Current goals can be found in the care plan section) Acute Rehab OT Goals Patient Stated Goal: have less pain and be indepednent, get back to driving, working OT Goal Formulation: With patient/family Time For Goal Achievement: 05/01/23 Potential to Achieve Goals: Good ADL Goals Pt Will Perform Lower Body Dressing: with modified independence;sit to/from stand (AE PRN, pain </=5/10) Pt Will Transfer to Toilet: with modified independence;ambulating (LRAD, pain </=5/10) Pt Will Perform Toileting - Clothing Manipulation and hygiene: with modified independence Additional ADL Goal #1: Pt will demonstrate independence with brace mgt.  OT Frequency: Min 1X/week    Co-evaluation PT/OT/SLP Co-Evaluation/Treatment: Yes Reason for Co-Treatment: To address functional/ADL transfers;For patient/therapist safety PT goals addressed during session: Mobility/safety with mobility;Proper use of  DME;Balance OT goals addressed during session: ADL's and self-care;Proper use of Adaptive equipment and DME      AM-PAC OT "6 Clicks" Daily Activity     Outcome Measure Help from another person eating meals?: None Help from another person taking care of personal grooming?: None Help from another person toileting, which includes using toliet, bedpan, or urinal?: A Little Help from another person bathing (including washing, rinsing, drying)?: A Little Help from another  person to put on and taking off regular upper body clothing?: None Help from another person to put on and taking off regular lower body clothing?: A Little 6 Click Score: 21   End of Session Equipment Utilized During Treatment: Gait belt;Rolling walker (2 wheels);Back brace Nurse Communication: Mobility status  Activity Tolerance: Patient limited by pain Patient left: in bed;with call bell/phone within reach;with bed alarm set;with nursing/sitter in room;with family/visitor present;with SCD's reapplied  OT Visit Diagnosis: Other abnormalities of gait and mobility (R26.89);Pain Pain - Right/Left:  (back)                Time: 1451-1520 OT Time Calculation (min): 29 min Charges:  OT General Charges $OT Visit: 1 Visit OT Evaluation $OT Eval Moderate Complexity: 1 Mod OT Treatments $Self Care/Home Management : 8-22 mins  Arman Filter., MPH, MS, OTR/L ascom [phone] 04/17/23, 4:06 PM

## 2023-04-17 NOTE — Progress Notes (Signed)
Pt resting without distress at this time.  Eyes closed, respirations even and unlabored. Chest rising and falling. Spouse at bedside.

## 2023-04-17 NOTE — Progress Notes (Signed)
PT Cancellation Note  Patient Details Name: Margaret Cuevas MRN: [id number] DOB: 06/14/1975   Cancelled Treatment:     PT evaluation and treatment not complete because pt is having 8/ 10 back pain with radiculopathy in RLE. A per Nurse, she is having uncontrolled pain at this point. PT Requested Brace and a TLSO was orders to facilitate mobility with reduced pain and improved stability. PT awaiting brace. PT will follow up tomorrow.    Janet Berlin PT DPT 2:27 PM,04/17/23

## 2023-04-17 NOTE — Evaluation (Signed)
Physical Therapy Evaluation Patient Details Name: Margaret Cuevas MRN: [id number] DOB: 1976-02-07 Today's Date: 04/17/2023  History of Present Illness  Pt is a 47 yo female s/p thoracic spinal cord stimulator placement on 04/16/23. PMHx includes chronic pain syndrome, and failed back surgical syndrome, hx 11/09/22 XLIF L4-5 XLIF and posterior spinal fusion, right L5-S1 far lateral foraminotomy, 08/07/2022 R L4/5 far lateral discectomy, 12/22/22 L4-5 XLIF and posterior spinal fusion, right L5-S1 far lateral foraminotomy.   Clinical Impression  Pt was pleasant and motivated to participate during the session and put forth good effort throughout. Pt required min A with sit to sidelying during log roll training and extra time and effort to come to standing but no physical assist.  Pt education provided on step-to sequencing during gait training to address history of RLE buckling/falls.  Pt demonstrated fair carryover of proper sequencing with only occasional reinforcement needed while ambulating 100 feet.  Pt presented with no instances of LOB or buckling during the session with no adverse symptoms reported other than back pain.  Pt will benefit from continued PT services upon discharge to safely address deficits listed in patient problem list for decreased caregiver assistance and eventual return to PLOF.          If plan is discharge home, recommend the following: A little help with walking and/or transfers;A little help with bathing/dressing/bathroom;Assistance with cooking/housework;Assist for transportation;Help with stairs or ramp for entrance   Can travel by private vehicle        Equipment Recommendations None recommended by PT  Recommendations for Other Services       Functional Status Assessment Patient has had a recent decline in their functional status and demonstrates the ability to make significant improvements in function in a reasonable and predictable amount of time.      Precautions / Restrictions Precautions Precautions: Fall;Back Precaution Booklet Issued: Yes (comment) Required Braces or Orthoses: Spinal Brace Spinal Brace: Thoracolumbosacral orthotic;Applied in sitting position Spinal Brace Comments: TLSO for comfort when ambulating for two weeks Restrictions Weight Bearing Restrictions Per Provider Order: No      Mobility  Bed Mobility Overal bed mobility: Needs Assistance Bed Mobility: Sit to Sidelying, Rolling Rolling: Supervision       Sit to sidelying: Min assist General bed mobility comments: Min A for BLE mgt during sit to sidelying    Transfers Overall transfer level: Needs assistance Equipment used: 1 person hand held assist, Rolling walker (2 wheels) Transfers: Sit to/from Stand Sit to Stand: Contact guard assist, Min assist           General transfer comment: Pt stood promptly with spouse assist at onset of session without an AD with extra effort to come to standing, walker promptly provided    Ambulation/Gait Ambulation/Gait assistance: Contact guard assist Gait Distance (Feet): 100 Feet Assistive device: Rolling walker (2 wheels) Gait Pattern/deviations: Step-to pattern, Decreased step length - left, Decreased stance time - right Gait velocity: decreased     General Gait Details: Step-to pattern education provided secondary to history of falls due to RLE buckling, pt steady throughout with fair carryover of proper sequencing  Stairs            Wheelchair Mobility     Tilt Bed    Modified Rankin (Stroke Patients Only)       Balance Overall balance assessment: Needs assistance Sitting-balance support: No upper extremity supported, Feet supported Sitting balance-Leahy Scale: Good     Standing balance support: Bilateral upper extremity supported, During functional  activity Standing balance-Leahy Scale: Fair                               Pertinent Vitals/Pain Pain Assessment Pain  Assessment: 0-10 Pain Score: 5  Pain Location: incision site Pain Descriptors / Indicators: Throbbing, Burning Pain Intervention(s): Monitored during session, Premedicated before session, Repositioned, Limited activity within patient's tolerance    Home Living Family/patient expects to be discharged to:: Private residence Living Arrangements: Spouse/significant other Available Help at Discharge: Family;Available 24 hours/day Type of Home: House Home Access: Stairs to enter Entrance Stairs-Rails: Right;Left (too wide for both) Entrance Stairs-Number of Steps: 5 with bilateral wide rails or 2 without rails   Home Layout: Able to live on main level with bedroom/bathroom;Two level Home Equipment: BSC/3in1;Rolling Walker (2 wheels);Rollator (4 wheels);Adaptive equipment Additional Comments: adjustable bed    Prior Function Prior Level of Function : Needs assist             Mobility Comments: uses 2WW for getting to bathroom, rollator in the rest of the house, 2WW for appts, 3 falls in 39mo 2/2 R side goes numb and gives out ADLs Comments: Stopped working Nov 30th, moderate assist for ADL 2/2 pain, has modified home to minimize bending, ; spouse does grocery shopping, haven't driven since April, hasn't worked since April     Extremity/Trunk Assessment   Upper Extremity Assessment Upper Extremity Assessment: Overall WFL for tasks assessed    Lower Extremity Assessment Lower Extremity Assessment: Generalized weakness;RLE deficits/detail RLE Deficits / Details: History of RLE weakness/buckling    Cervical / Trunk Assessment Cervical / Trunk Assessment: Back Surgery  Communication   Communication Communication: No apparent difficulties Cueing Techniques: Verbal cues;Visual cues  Cognition Arousal: Alert Behavior During Therapy: WFL for tasks assessed/performed Overall Cognitive Status: Within Functional Limits for tasks assessed                                           General Comments      Exercises Other Exercises Other Exercises: Log roll training review provided with OT providing general back precaution education and handout   Assessment/Plan    PT Assessment Patient needs continued PT services  PT Problem List Decreased strength;Decreased activity tolerance;Decreased balance;Decreased mobility;Decreased knowledge of use of DME       PT Treatment Interventions DME instruction;Gait training;Stair training;Functional mobility training;Therapeutic exercise;Therapeutic activities;Balance training;Patient/family education    PT Goals (Current goals can be found in the Care Plan section)  Acute Rehab PT Goals Patient Stated Goal: Decreased pain PT Goal Formulation: With patient Time For Goal Achievement: 04/30/23 Potential to Achieve Goals: Good    Frequency Min 1X/week     Co-evaluation PT/OT/SLP Co-Evaluation/Treatment: Yes Reason for Co-Treatment: To address functional/ADL transfers;For patient/therapist safety PT goals addressed during session: Mobility/safety with mobility;Proper use of DME;Balance OT goals addressed during session: ADL's and self-care;Proper use of Adaptive equipment and DME       AM-PAC PT "6 Clicks" Mobility  Outcome Measure Help needed turning from your back to your side while in a flat bed without using bedrails?: A Little Help needed moving from lying on your back to sitting on the side of a flat bed without using bedrails?: A Little Help needed moving to and from a bed to a chair (including a wheelchair)?: A Little Help needed standing up from  a chair using your arms (e.g., wheelchair or bedside chair)?: A Little Help needed to walk in hospital room?: A Little Help needed climbing 3-5 steps with a railing? : A Lot 6 Click Score: 17    End of Session Equipment Utilized During Treatment: Gait belt;Back brace Activity Tolerance: Patient tolerated treatment well Patient left: in bed;with call  bell/phone within reach;with bed alarm set;with SCD's reapplied;with nursing/sitter in room;with family/visitor present Nurse Communication: Mobility status PT Visit Diagnosis: History of falling (Z91.81);Difficulty in walking, not elsewhere classified (R26.2);Muscle weakness (generalized) (M62.81);Pain Pain - part of body:  (back)    Time: 1610-9604 PT Time Calculation (min) (ACUTE ONLY): 28 min   Charges:   PT Evaluation $PT Eval Moderate Complexity: 1 Mod PT Treatments $Gait Training: 8-22 mins PT General Charges $$ ACUTE PT VISIT: 1 Visit       D. Elly Modena PT, DPT 04/17/23, 4:25 PM

## 2023-04-18 DIAGNOSIS — G894 Chronic pain syndrome: Secondary | ICD-10-CM | POA: Diagnosis not present

## 2023-04-18 DIAGNOSIS — M961 Postlaminectomy syndrome, not elsewhere classified: Secondary | ICD-10-CM | POA: Diagnosis not present

## 2023-04-18 MED ORDER — OXYCODONE HCL 10 MG PO TABS: 10.0000 mg | ORAL_TABLET | ORAL | 0 refills | Status: DC | PRN

## 2023-04-18 NOTE — Discharge Summary (Signed)
Physician Discharge Summary   Patient: Margaret Cuevas MRN: [id number] DOB: 1976-03-10  Admit date:     04/16/2023  Discharge date: 04/18/23  Discharge Physician: Lovenia Kim   PCP: [name], MD   Recommendations at discharge:   Your follow-up appointments are set up.  You can take off your dressings tomorrow.  Will work with your stimulator company to titrate your settings.  Discharge Diagnoses: Principal Problem:   Chronic pain  Resolved Problems:   * No resolved hospital problems. [facility] Course: Patient was taken to the operating room on 04/16/2023, she was seen yesterday and evaluated for discharge.  At that point she was having significant pain and was having difficulty getting up and out of bed due to the pain.  She also said some intermittent blood pressure issues, however this resolved when placing the cuff on her forearm.  She was given some IV fluids and continued to observe.  On the morning of 04/18/2023 she was feeling better, still having expected pain but able to get up and use the restroom.  Her blood pressure is better with forearm blood pressure testing.  At this point she felt comfortable being discharged home for recovery.    Assessment and Plan: Plan for discharge today, postop day 2.  Overall doing well, expected postoperative pain.  Her dressings are clean.  They will be removed tomorrow.  Medications have been sent to her pharmacy.        Pain control - [employer] Controlled Substance Reporting System database was reviewed. and patient was instructed, not to drive, operate heavy machinery, perform activities at heights, swimming or participation in water activities or provide baby-sitting services while on Pain, Sleep and Anxiety Medications; until their outpatient Physician has advised to do so again. Also recommended to not to take more than prescribed Pain, Sleep and Anxiety Medications.   Consultants: None Procedures performed:  Thoracic spinal cord stimulator placement Disposition: Home Diet recommendation:  Regular diet  DISCHARGE MEDICATION: Allergies as of 04/18/2023       Reactions   Ibuprofen Other (See Comments)   Motrin makes her throat swell. Can take ibuprofen        Medication List     TAKE these medications    acetaminophen 650 MG CR tablet Commonly known as: TYLENOL Take 1,300 mg by mouth every 8 (eight) hours as needed for pain.   busPIRone 7.5 MG tablet Commonly known as: BUSPAR Take 1 tablet (7.5 mg total) by mouth 2 (two) times daily.   celecoxib 100 MG capsule Commonly known as: CELEBREX Take 100 mg by mouth 2 (two) times daily. Rheum   CITRACAL +D3 PO Take 1,200 mg by mouth 2 (two) times daily.   cyclobenzaprine 10 MG tablet Commonly known as: FLEXERIL TAKE 1 TABLET(10 MG) BY MOUTH THREE TIMES DAILY AS NEEDED FOR MUSCLE SPASMS   DULoxetine 60 MG capsule Commonly known as: CYMBALTA Take 60 mg by mouth See admin instructions. Take with 30 mg for a total of 90 mg daily   DULoxetine 30 MG capsule Commonly known as: CYMBALTA Take 1 capsule (30 mg total) by mouth daily. Total of 90 mg daily, take along with 60 mg cap   estradiol 0.1 MG/24HR patch Commonly known as: VIVELLE-DOT Place 1 patch (0.1 mg total) onto the skin 2 (two) times a week. Dr Schermerhorn   gabapentin 600 MG tablet Commonly known as: Neurontin Take 2 tablets (1,200 mg total) by mouth 3 (three) times daily.  Multi-Vitamin tablet Take 1 tablet by mouth daily. Bariatric Chewable   omeprazole 40 MG capsule Commonly known as: PRILOSEC Take 40 mg by mouth daily.   Oxycodone HCl 10 MG Tabs Take 1 tablet (10 mg total) by mouth every 4 (four) hours as needed for severe pain (pain score 7-10). What changed:  medication strength how much to take when to take this   senna 8.6 MG Tabs tablet Commonly known as: SENOKOT Take 1 tablet (8.6 mg total) by mouth 2 (two) times daily. What changed: when to  take this   sucralfate 1 g tablet Commonly known as: CARAFATE Take 1 g by mouth 4 (four) times daily -  with meals and at bedtime.   triamcinolone 55 MCG/ACT Aero nasal inhaler Commonly known as: NASACORT Place 2 sprays into the nose daily.        Follow-up Information     Drake Leach, PA-C Follow up on 04/30/2023.   Specialty: Neurosurgery Contact information: [address] [place] [address] [phone]                 Discharge Exam: [name] Weights   04/16/23 1133  Weight: 115 kg   AA Ox3 CNI   Strength: Her exam is somewhat limited by pain Side Iliopsoas Quads Hamstring PF DF EHL  R 4+ 5 5 5 5 5   L 5 5 5 5 5 5    Dressings are in place, not saturated.  Condition at discharge: fair  The results of significant diagnostics from this hospitalization (including imaging, microbiology, ancillary and laboratory) are listed below for reference.   Imaging Studies: DG Thoracic Spine 2 View Result Date: 04/16/2023 CLINICAL DATA:  [id number] Surgery, elective [id number] EXAM: THORACIC SPINE 2 VIEWS COMPARISON:  None Available. FINDINGS: Two frontal fluoroscopic patch that 2 fluoroscopic spot views of the thoracic spine obtained in the operating room. Initial image demonstrates instruments localizing over the lower aspect of T8 and at the T10 level. Subsequent placement of spinal stimulator with tips at the T8-T9 level. Fluoroscopy time 7 seconds. Dose 2.4495 mGy. IMPRESSION: Intraoperative fluoroscopy during spinal stimulator placement. Electronically Signed   By: Narda Rutherford M.D.   On: 04/16/2023 17:29   DG C-Arm 1-60 Min-No Report Result Date: 04/16/2023 Fluoroscopy was utilized by the requesting physician.  No radiographic interpretation.   DG C-Arm 1-60 Min-No Report Result Date: 04/16/2023 Fluoroscopy was utilized by the requesting physician.  No radiographic interpretation.   DG PAIN CLINIC C-ARM 1-60 MIN NO REPORT Result Date:  03/24/2023 Fluoro was used, but no Radiologist interpretation will be provided. Please refer to "NOTES" tab for provider progress note.   Microbiology: Results for orders placed or performed during the hospital encounter of 04/06/23  Surgical pcr screen     Status: None   Collection Time: 04/06/23  2:54 PM   Specimen: Nasal Mucosa; Nasal Swab  Result Value Ref Range Status   MRSA, PCR NEGATIVE NEGATIVE Final   Staphylococcus aureus NEGATIVE NEGATIVE Final    Comment: (NOTE) The Xpert SA Assay (FDA approved for NASAL specimens in patients 45 years of age and older), is one component of a comprehensive surveillance program. It is not intended to diagnose infection nor to guide or monitor treatment. Performed at [facility], [address] [place], [place] [address]     Labs: CBC: No results for input(s): "WBC", "NEUTROABS", "HGB", "HCT", "MCV", "PLT" in the last 168 hours. Basic Metabolic Panel: No results for input(s): "NA", "K", "CL", "CO2", "  GLUCOSE", "BUN", "CREATININE", "CALCIUM", "MG", "PHOS" in the last 168 hours. Liver Function Tests: No results for input(s): "AST", "ALT", "ALKPHOS", "BILITOT", "PROT", "ALBUMIN" in the last 168 hours. CBG: Recent Labs  Lab 04/17/23 1158  GLUCAP 131*    Discharge time spent: greater than 30 minutes.  Signed: Lovenia Kim, MD [facility] 04/18/2023

## 2023-04-18 NOTE — Plan of Care (Signed)

## 2023-04-18 NOTE — TOC Progression Note (Signed)
Transition of Care [facility]) - Progression Note    Patient Details  Name: Margaret Cuevas MRN: [id number] Date of Birth: 1975-09-14  Transition [facility]) CM/SW Contact  Hetty Ely, RN Phone Number: 04/18/2023, 10:19 AM  Clinical Narrative:   Spoke with patient about HHPT/OT recommendation, patient consents prefer [facility], who serviced her in 7/24. CM sent text to [facility], waiting on reply.         Expected Discharge Plan and Services                                               Social Determinants of Health (SDOH) Interventions SDOH Screenings   Food Insecurity: No Food Insecurity (04/16/2023)  Housing: Low Risk  (04/16/2023)  Transportation Needs: No Transportation Needs (04/16/2023)  Utilities: Not At Risk (04/16/2023)  Alcohol Screen: Low Risk  (09/22/2022)  Depression (PHQ2-9): Low Risk  (03/24/2023)  Recent Concern: Depression (PHQ2-9) - High Risk (02/26/2023)  Financial Resource Strain: Low Risk  (02/25/2023)  Physical Activity: Unknown (02/25/2023)  Social Connections: Unknown (02/25/2023)  Stress: Stress Concern Present (02/25/2023)  Tobacco Use: Medium Risk (04/16/2023)    Readmission Risk Interventions     No data to display

## 2023-04-19 ENCOUNTER — Encounter: Payer: Self-pay | Admitting: Neurosurgery

## 2023-04-19 NOTE — Anesthesia Postprocedure Evaluation (Signed)
Anesthesia Post Note  Patient: Margaret Cuevas  Procedure(s) Performed: THORACIC LAMINECTOMY FOR SPINAL CORD STIMULATOR PLACEMENT [facility])  Patient location during evaluation: PACU Anesthesia Type: General Level of consciousness: awake and alert Pain management: pain level controlled Vital Signs Assessment: post-procedure vital signs reviewed and stable Respiratory status: spontaneous breathing, nonlabored ventilation and respiratory function stable Cardiovascular status: blood pressure returned to baseline and stable Postop Assessment: no apparent nausea or vomiting Anesthetic complications: no   No notable events documented.   Last Vitals:  Vitals:   04/18/23 0834 04/18/23 1000  BP: (!) 93/58 100/62  Pulse: 96 100  Resp: 18   Temp: 36.9 C   SpO2: 97%     Last Pain:  Vitals:   04/18/23 1141  TempSrc:   PainSc: 4                  Foye Deer

## 2023-04-22 ENCOUNTER — Ambulatory Visit: Payer: Federal, State, Local not specified - PPO | Admitting: Psychology

## 2023-04-25 DIAGNOSIS — G4733 Obstructive sleep apnea (adult) (pediatric): Secondary | ICD-10-CM | POA: Diagnosis not present

## 2023-04-27 ENCOUNTER — Ambulatory Visit: Payer: No Typology Code available for payment source | Admitting: Neurosurgery

## 2023-04-29 NOTE — Progress Notes (Signed)
   REFERRING PHYSICIAN:  Joshua Cathryne BROCKS, Md [address] [place],  [place] [address]  DOS: 04/16/23  placement of Nevro SCS  HISTORY OF PRESENT ILLNESS: Margaret Cuevas is approximately 2 weeks status post above surgery. Was given oxycodone  10mg  on discharge from the hospital.   She has pain around thoracis incision with some burning in left lower back/buttock. No significant change in her preop pain- she continues with constant LBP with bilateral leg pain.  She is taking oxycodone  10mg , flexeril , and neurontin . Needs refill of oxycodone  10mg .   PHYSICAL EXAMINATION:  General: Patient is well developed, well nourished, calm, collected, and in no apparent distress.   NEUROLOGICAL:  General: In no acute distress.   Awake, alert, oriented to person, place, and time.  Pupils equal round and reactive to light.  Facial tone is symmetric.     Strength:          Side Iliopsoas Quads Hamstring PF DF EHL  R 5 5 5 5 5 5   L 5 5 5 5 5 5    Incisions c/d/I  Sensation intact to light touch in bilateral lower extremities, but is diminished in right leg from knee down.    ROS (Neurologic):  Negative except as noted above  IMAGING: Nothing new to review.   ASSESSMENT/PLAN:  Margaret Cuevas is doing reasonable s/p above surgery. Treatment options reviewed with patient and following plan made:   - I have advised the patient to lift up to 10 pounds until 6 weeks after surgery (follow up with Dr. Clois).  - Reviewed wound care.  - No bending, twisting, or lifting.  - [employer] S[employer] rep (Scott) was in to do some reprogramming with her.  - Continue oxycodone  10. Will refill 10mg  and change to q 6 hours prn severe pain. Will plan to go down to 5mg  at next refill. PMP reviewed and is appropriate.    - Follow up as scheduled in 4 weeks and prn.   Advised to contact the office if any questions or concerns arise.  Glade Boys PA-C Department of neurosurgery

## 2023-04-30 ENCOUNTER — Encounter: Payer: Self-pay | Admitting: Orthopedic Surgery

## 2023-04-30 ENCOUNTER — Ambulatory Visit (INDEPENDENT_AMBULATORY_CARE_PROVIDER_SITE_OTHER): Payer: Federal, State, Local not specified - PPO | Admitting: Orthopedic Surgery

## 2023-04-30 VITALS — BP 126/82 | Temp 98.0°F | Ht 65.0 in | Wt 253.0 lb

## 2023-04-30 DIAGNOSIS — G894 Chronic pain syndrome: Secondary | ICD-10-CM

## 2023-04-30 DIAGNOSIS — M961 Postlaminectomy syndrome, not elsewhere classified: Secondary | ICD-10-CM

## 2023-04-30 DIAGNOSIS — Z9689 Presence of other specified functional implants: Secondary | ICD-10-CM

## 2023-04-30 MED ORDER — OXYCODONE HCL 10 MG PO TABS: 10.0000 mg | ORAL_TABLET | Freq: Four times a day (QID) | ORAL | 0 refills | Status: DC | PRN

## 2023-05-06 DIAGNOSIS — Z9884 Bariatric surgery status: Secondary | ICD-10-CM | POA: Diagnosis not present

## 2023-05-06 DIAGNOSIS — G4730 Sleep apnea, unspecified: Secondary | ICD-10-CM | POA: Diagnosis not present

## 2023-05-06 DIAGNOSIS — G4733 Obstructive sleep apnea (adult) (pediatric): Secondary | ICD-10-CM | POA: Diagnosis not present

## 2023-05-06 DIAGNOSIS — E7870 Disorder of bile acid and cholesterol metabolism, unspecified: Secondary | ICD-10-CM | POA: Diagnosis not present

## 2023-05-06 DIAGNOSIS — D649 Anemia, unspecified: Secondary | ICD-10-CM | POA: Diagnosis not present

## 2023-05-06 DIAGNOSIS — Z87891 Personal history of nicotine dependence: Secondary | ICD-10-CM | POA: Diagnosis not present

## 2023-05-06 DIAGNOSIS — E669 Obesity, unspecified: Secondary | ICD-10-CM | POA: Diagnosis not present

## 2023-05-06 DIAGNOSIS — Z01818 Encounter for other preprocedural examination: Secondary | ICD-10-CM | POA: Diagnosis not present

## 2023-05-06 DIAGNOSIS — F411 Generalized anxiety disorder: Secondary | ICD-10-CM | POA: Diagnosis not present

## 2023-05-06 DIAGNOSIS — K219 Gastro-esophageal reflux disease without esophagitis: Secondary | ICD-10-CM | POA: Diagnosis not present

## 2023-05-06 DIAGNOSIS — G8929 Other chronic pain: Secondary | ICD-10-CM | POA: Diagnosis not present

## 2023-05-06 DIAGNOSIS — M48061 Spinal stenosis, lumbar region without neurogenic claudication: Secondary | ICD-10-CM | POA: Diagnosis not present

## 2023-05-06 DIAGNOSIS — Z6841 Body Mass Index (BMI) 40.0 and over, adult: Secondary | ICD-10-CM | POA: Diagnosis not present

## 2023-05-06 DIAGNOSIS — M797 Fibromyalgia: Secondary | ICD-10-CM | POA: Diagnosis not present

## 2023-05-07 ENCOUNTER — Ambulatory Visit (INDEPENDENT_AMBULATORY_CARE_PROVIDER_SITE_OTHER): Payer: Federal, State, Local not specified - PPO | Admitting: Psychology

## 2023-05-07 DIAGNOSIS — F4323 Adjustment disorder with mixed anxiety and depressed mood: Secondary | ICD-10-CM

## 2023-05-07 NOTE — Progress Notes (Signed)
 [facility] Counselor/Therapist Progress Note  Patient ID: Margaret Cuevas, MRN: [id number],    Date: 05/07/2023  Time Spent: 50 mins start time: 1000    end time: 1050  Treatment Type: Individual Therapy  Reported Symptoms: Pt presents for session, via Caregility video, granting consent for the session.  Pt states she is in her home with no one else present; she also shares she understands the limits of virtual sessions.  I shared with pt that I am in my office with no one else here.  Mental Status Exam: Appearance:  Casual     Behavior: Appropriate  Motor: Normal  Speech/Language:  Clear and Coherent  Affect: Appropriate  Mood: normal  Thought process: normal  Thought content:   WNL  Sensory/Perceptual disturbances:   WNL  Orientation: oriented to person, place, and time/date  Attention: Good  Concentration: Good  Memory: WNL  Fund of knowledge:  Good  Insight:   Good  Judgment:  Good  Impulse Control: Good   Risk Assessment: Danger to Self:  No Self-injurious Behavior: No Danger to Others: No Duty to Warn:no Physical Aggression / Violence:No  Access to Firearms a concern: No  Gang Involvement:No   Subjective: Pt shares that she has been doing OK I guess.  I had my last surgery on 04/16/23 and they put the spinal cord stimulator in and I have been recovering from that.  I had my follow up appt for that surgery last week and they turned the intensity up a bit on the stimulator.  This past Monday, I was hurting a lot and it was making me crazy.  I was in bed all day Monday because of the pain.  The rep called back on Tuesday and I told her something was not right, I was having too much pain.  It turns out the whole machine was off the whole weekend and she turned on it for me.  Within 3 hours of her turning it on and setting it off, I felt like a whole new person.  I was still having some pain, but I could function again.  I also had my pre-op appt yesterday  (almost 7 hours) for my gastric surgery again (still pending scheduling, because of her spinal cord stimulator procedure; it will likely be in Feb).  I have also had the flu during the past week but I am finally starting to feel better from that now.  Pt shares her husband managed through the holidays OK with the recent loss of his mom shortly before the holidays.  Pt has a back surgery follow up appt on 1/30.  Pt continues to take her Bupropion and her Cymbalta  daily.  Pt shares that she has been watching TV as a self care activity.  Encouraged pt to remember to use her deep breathing to help her relax and to breathe through her pain episodes.  Encouraged pt to continue with her self care activities and we will meet in 3 wks for a follow up session.  Interventions: Cognitive Behavioral Therapy  Diagnosis:Adjustment disorder with mixed anxiety and depressed mood  Plan: Treatment Plan Strengths/Abilities:  Intelligent, Intuitive, Willing to participate in therapy Treatment Preferences:  Outpatient Individual Therapy Statement of Needs:  Patient is to use CBT, mindfulness and coping skills to help manage and/or decrease symptoms associated with their diagnosis. Symptoms:  Depressed/Irritable mood, worry, social withdrawal Problems Addressed:  Depressive thoughts, Sadness, Sleep issues, etc. Long Term Goals:  Pt to reduce overall level, frequency, and  intensity of the feelings of depression/anxiety as evidenced by decreased irritability, negative self talk, and helpless feelings from 6 to 7 days/week to 0 to 1 days/week, per client report, for at least 3 consecutive months.  Progress: 30% Short Term Goals:  Pt to verbally express understanding of the relationship between feelings of depression/anxiety and their impact on thinking patterns and behaviors.  Pt to verbalize an understanding of the role that distorted thinking plays in creating fears, excessive worry, and ruminations.  Progress: 30% Target  Date:  12/23/2023 Frequency:  Bi-weekly Modality:  Cognitive Behavioral Therapy Interventions by Therapist:  Therapist will use CBT, Mindfulness exercises, Coping skills and Referrals, as needed by client. Client has verbally approved this treatment plan.  Francis KATHEE Macintosh, [facility]

## 2023-05-10 ENCOUNTER — Encounter: Payer: Self-pay | Admitting: Neurosurgery

## 2023-05-10 NOTE — Telephone Encounter (Signed)
 Reviewed with Dr. Myer Haff. He agrees this should improve with time.

## 2023-05-12 ENCOUNTER — Other Ambulatory Visit: Payer: Self-pay | Admitting: Orthopedic Surgery

## 2023-05-12 DIAGNOSIS — Z9689 Presence of other specified functional implants: Secondary | ICD-10-CM

## 2023-05-12 DIAGNOSIS — G894 Chronic pain syndrome: Secondary | ICD-10-CM

## 2023-05-12 MED ORDER — OXYCODONE HCL 10 MG PO TABS: 10.0000 mg | ORAL_TABLET | Freq: Four times a day (QID) | ORAL | 0 refills | Status: DC | PRN

## 2023-05-12 NOTE — Telephone Encounter (Signed)
 DOS: 04/16/23  placement of Nevro SCS   Okay for refill of oxycodone . PMP reviewed and is appropriate.

## 2023-05-21 ENCOUNTER — Other Ambulatory Visit: Payer: Self-pay | Admitting: Psychiatry

## 2023-05-26 DIAGNOSIS — G4733 Obstructive sleep apnea (adult) (pediatric): Secondary | ICD-10-CM | POA: Diagnosis not present

## 2023-05-27 ENCOUNTER — Telehealth: Payer: Self-pay

## 2023-05-27 ENCOUNTER — Ambulatory Visit: Payer: Federal, State, Local not specified - PPO | Admitting: Neurosurgery

## 2023-05-27 VITALS — BP 134/82 | Ht 65.0 in | Wt 253.0 lb

## 2023-05-27 DIAGNOSIS — Z09 Encounter for follow-up examination after completed treatment for conditions other than malignant neoplasm: Secondary | ICD-10-CM

## 2023-05-27 DIAGNOSIS — G894 Chronic pain syndrome: Secondary | ICD-10-CM

## 2023-05-27 NOTE — Telephone Encounter (Signed)
Patient scheduled letter mailed

## 2023-05-27 NOTE — Progress Notes (Signed)
   REFERRING PHYSICIAN:  [name], Md [address] [place],  [place] [address]  DOS: 04/16/23  placement of Nevro SCS  HISTORY OF PRESENT ILLNESS: Mariadejesus Cade is status post above surgery.   She has noticed some improvement in her pain.  She has a secondary issue related to her gastric bypass surgery.  She is having surgery next week.   PHYSICAL EXAMINATION:  General: Patient is well developed, well nourished, calm, collected, and in no apparent distress.   NEUROLOGICAL:  General: In no acute distress.   Awake, alert, oriented to person, place, and time.  Pupils equal round and reactive to light.  Facial tone is symmetric.     Strength:          Side Iliopsoas Quads Hamstring PF DF EHL  R 4 5 5 5 5 5   L 5 5 5 5 5 5    Incisions c/d/I  Sensation intact to light touch in bilateral lower extremities, but is diminished in right leg from knee down.    ROS (Neurologic):  Negative except as noted above  IMAGING: Nothing new to review.   ASSESSMENT/PLAN:  Andre Gallego is doing reasonable s/p above surgery.  She will continue working on programming with her rep.  Will see her back in clinic in approximately 6 weeks.    After she has gotten over her abdominal surgery, she may wish to see Dr. Lourdes Sledge again to discuss SI joint injections.  I will be happy to rerefer her when she is ready.  She is cleared to wear her brace as needed.  Venetia Night MD Department of neurosurgery

## 2023-05-27 NOTE — Telephone Encounter (Signed)
-----   Message from [facility] sent at 05/27/2023  2:43 PM EST ----- Regarding: RE: follow up [facility] to see with xrays about 1 year out from that surgery ----- Message ----- From: Sharlot Gowda, RN Sent: 05/27/2023  11:42 AM EST To: Venetia Night, MD; # Subject: follow up                                      Her fusion was 11/09/22. When does she need to come back w/ xrays to follow her fusion? I'm assuming her next visit can be w/ a PA

## 2023-05-27 NOTE — Telephone Encounter (Signed)
Please schedule her a return appt in July 2025 with xrays. Thank you

## 2023-05-28 ENCOUNTER — Other Ambulatory Visit: Payer: Self-pay

## 2023-05-28 ENCOUNTER — Ambulatory Visit (INDEPENDENT_AMBULATORY_CARE_PROVIDER_SITE_OTHER): Payer: Federal, State, Local not specified - PPO | Admitting: Psychology

## 2023-05-28 ENCOUNTER — Telehealth: Payer: Self-pay

## 2023-05-28 ENCOUNTER — Other Ambulatory Visit: Payer: Self-pay | Admitting: Orthopedic Surgery

## 2023-05-28 DIAGNOSIS — F4323 Adjustment disorder with mixed anxiety and depressed mood: Secondary | ICD-10-CM

## 2023-05-28 DIAGNOSIS — G894 Chronic pain syndrome: Secondary | ICD-10-CM

## 2023-05-28 DIAGNOSIS — Z9689 Presence of other specified functional implants: Secondary | ICD-10-CM

## 2023-05-28 MED ORDER — OXYCODONE HCL 5 MG PO TABS: 5.0000 mg | ORAL_TABLET | Freq: Four times a day (QID) | ORAL | 0 refills | Status: DC | PRN

## 2023-05-28 MED ORDER — GABAPENTIN 600 MG PO TABS: 1200.0000 mg | ORAL_TABLET | Freq: Three times a day (TID) | ORAL | 1 refills | Status: DC

## 2023-05-28 NOTE — Telephone Encounter (Signed)
DOS: 04/16/23 placement of Nevro SCS   Oxycodone 10mg  script sent back denied. This has been stopped.   Will go back to oxycodone 5mg  q 6 hours prn. PMP reviewed and is appropriate.   Will plan to go to q 8 hours at next refill.   Message sent to patient.

## 2023-05-28 NOTE — Telephone Encounter (Signed)
I called patient to notify her that we were able to refill her medication. She was thankful that we were able to take care of this so she had medications this weekend.

## 2023-05-28 NOTE — Progress Notes (Signed)
[facility] Counselor/Therapist Progress Note  Patient ID: Margaret Cuevas, MRN: [id number],    Date: 05/28/2023  Time Spent: 50 mins start time: 1500    end time: 1550  Treatment Type: Individual Therapy  Reported Symptoms: Pt presents for session, via Caregility video, granting consent for the session.  Pt states she is in her home with no one else present; she also shares she understands the limits of virtual sessions.  I shared with pt that I am in my office with no one else here.  Mental Status Exam: Appearance:  Casual     Behavior: Appropriate  Motor: Normal  Speech/Language:  Clear and Coherent  Affect: Appropriate  Mood: normal  Thought process: normal  Thought content:   WNL  Sensory/Perceptual disturbances:   WNL  Orientation: oriented to person, place, and time/date  Attention: Good  Concentration: Good  Memory: WNL  Fund of knowledge:  Good  Insight:   Good  Judgment:  Good  Impulse Control: Good   Risk Assessment: Danger to Self:  No Self-injurious Behavior: No Danger to Others: No Duty to Warn:no Physical Aggression / Violence:No  Access to Firearms a concern: No  Gang Involvement:No   Subjective: Pt shares that she "has been doing OK I guess.  My gastric surgery is next Thursday (2/6).  I am looking forward to having that behind me; I am anxious about it too.  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  I had my follow up visit with my spinal cord stimulator implant surgeon and the rep from the company was there and he changed the settings even though I asked him not to mess with it.  I have been in pain since then."  Pt shares that she appreciates the SCS when it is working well; she still has some pain but it takes the sharpness away.  The SCS reps are touching base with her each week, either on Tuesday or Wednesday.  Pt shares that she was able to get her hair done yesterday and she appreciated being able to do that.  Pt shares  that she had her first gastric bypass surgery on 06/11/2003; "almost 20 yrs ago."  Pt shares she lost a great deal of weight and had good results from it; she recovered well from it too.  She is hopeful that she will do well again.  Pt shares she has started her "liver cleansing diet" which is a pre-surgical process for her gastric surgery.  She is also supposed to be eating 85 gm of protein daily and that is hard for her to accomplish.  She will find out next Wednesday what time her surgery will be on Thursday.  Pt continues to take her Bupropion and her Cymbalta daily.  Pt shares that she has been watching TV as a self care activity.  Encouraged pt to remember to use her deep breathing to help her relax and to breathe through her pain episodes.  Encouraged pt to continue with her self care activities and we will meet in 2 wks for a follow up session.  Interventions: Cognitive Behavioral Therapy  Diagnosis:Adjustment disorder with mixed anxiety and depressed mood  Plan: Treatment Plan Strengths/Abilities:  Intelligent, Intuitive, Willing to participate in therapy Treatment Preferences:  Outpatient Individual Therapy Statement of Needs:  Patient is to use CBT, mindfulness and coping skills to help manage and/or decrease symptoms associated with their diagnosis. Symptoms:  Depressed/Irritable mood, worry, social withdrawal Problems Addressed:  Depressive  thoughts, Sadness, Sleep issues, etc. Long Term Goals:  Pt to reduce overall level, frequency, and intensity of the feelings of depression/anxiety as evidenced by decreased irritability, negative self talk, and helpless feelings from 6 to 7 days/week to 0 to 1 days/week, per client report, for at least 3 consecutive months.  Progress: 30% Short Term Goals:  Pt to verbally express understanding of the relationship between feelings of depression/anxiety and their impact on thinking patterns and behaviors.  Pt to verbalize an understanding of the role that  distorted thinking plays in creating fears, excessive worry, and ruminations.  Progress: 30% Target Date:  12/23/2023 Frequency:  Bi-weekly Modality:  Cognitive Behavioral Therapy Interventions by Therapist:  Therapist will use CBT, Mindfulness exercises, Coping skills and Referrals, as needed by client. Client has verbally approved this treatment plan.  Karie Kirks, [facility]

## 2023-06-03 DIAGNOSIS — Z9884 Bariatric surgery status: Secondary | ICD-10-CM | POA: Diagnosis not present

## 2023-06-03 DIAGNOSIS — Z79899 Other long term (current) drug therapy: Secondary | ICD-10-CM | POA: Diagnosis not present

## 2023-06-03 DIAGNOSIS — Z7901 Long term (current) use of anticoagulants: Secondary | ICD-10-CM | POA: Diagnosis not present

## 2023-06-03 DIAGNOSIS — Z87891 Personal history of nicotine dependence: Secondary | ICD-10-CM | POA: Diagnosis not present

## 2023-06-03 DIAGNOSIS — G4733 Obstructive sleep apnea (adult) (pediatric): Secondary | ICD-10-CM | POA: Diagnosis not present

## 2023-06-03 DIAGNOSIS — Z6841 Body Mass Index (BMI) 40.0 and over, adult: Secondary | ICD-10-CM | POA: Diagnosis not present

## 2023-06-03 DIAGNOSIS — Z886 Allergy status to analgesic agent status: Secondary | ICD-10-CM | POA: Diagnosis not present

## 2023-06-03 DIAGNOSIS — E7870 Disorder of bile acid and cholesterol metabolism, unspecified: Secondary | ICD-10-CM | POA: Diagnosis not present

## 2023-06-03 DIAGNOSIS — K219 Gastro-esophageal reflux disease without esophagitis: Secondary | ICD-10-CM | POA: Diagnosis not present

## 2023-06-07 ENCOUNTER — Telehealth: Payer: Self-pay

## 2023-06-07 DIAGNOSIS — G894 Chronic pain syndrome: Secondary | ICD-10-CM

## 2023-06-07 DIAGNOSIS — M5126 Other intervertebral disc displacement, lumbar region: Secondary | ICD-10-CM

## 2023-06-07 DIAGNOSIS — M797 Fibromyalgia: Secondary | ICD-10-CM

## 2023-06-07 DIAGNOSIS — G8929 Other chronic pain: Secondary | ICD-10-CM

## 2023-06-07 DIAGNOSIS — M961 Postlaminectomy syndrome, not elsewhere classified: Secondary | ICD-10-CM

## 2023-06-07 NOTE — Addendum Note (Signed)
 Addended by: Grafton Lawrence on: 06/07/2023 02:31 PM   Modules accepted: Orders

## 2023-06-07 NOTE — Patient Instructions (Signed)
 Visit Information  Thank you for taking time to visit with me today. Please don't hesitate to contact me if I can be of assistance to you before our next scheduled telephone appointment.  Our next appointment is by telephone on 06/15/23 at 1:00pm  Following is a copy of your care plan:   Goals Addressed             This Visit's Progress    TOC Care Plan       Current Barriers:  Provider appointments surgical and Bariatric follow-up Equipment/DME walker, recliner, mobility assistance  Functional/Safety risk falls SDOH (other) social isolation , lonely, needs support at home due to physical limitations   RNCM Clinical Goal(s):  Patient will work with the Care Management team over the next 30 days to address Transition of Care Barriers: Medication Management Support at home Provider appointments take all medications exactly as prescribed and will call provider for medication related questions as evidenced by no missed medication doses  attend all scheduled medical appointments: with surgeon and Bariatric follow-up as evidenced by no missed follow-up work with Child psychotherapist to address  related to the management of Limited social support, Transportation, and ADL IADL limitations management of household tasks and social activity  related to the recent surgery and physical restrictions  as evidenced by review of EMR and patient or Child psychotherapist report through collaboration with Medical illustrator, provider, and care team.   Interventions: Evaluation of current treatment plan related to  self management and patient's adherence to plan as established by provider  Transitions of Care:  New goal. Community Resource Referral Made to address Social Connections Physical Activity Doctor Visits  - discussed the importance of doctor visits Post discharge activity limitations prescribed by provider reviewed Post-op wound/incision care reviewed with patient/caregiver Reviewed Signs and symptoms of  infection  Patient Goals/Self-Care Activities: Participate in Transition of Care Program/Attend TOC scheduled calls Take all medications as prescribed Attend all scheduled provider appointments Call pharmacy for medication refills 3-7 days in advance of running out of medications Attend church or other social activities Perform all self care activities independently  Perform IADL's (shopping, preparing meals, housekeeping, managing finances) independently Call provider office for new concerns or questions   Follow Up Plan:  Telephone follow up appointment with care management team member scheduled for:  06/15/23 @ 1:00pm The patient has been provided with contact information for the care management team and has been advised to call with any health related questions or concerns.          Medication review  Reviewed current home medications -- provided education as needed. Patient is aware of potential side effects and was encouraged to notify PCP for any adverse side effects or unwanted symptoms not relieved with interventions  Patient will call 911 for Medical Emergencies or Life -Threatening Symptoms.  Reviewed goals for care Patient/ Caregiver verbalizes understanding of instructions with the plan of care . The  Patient / Caregiver was encouraged to make informed decisions about care, actively participate in managing health conditions, and implement lifestyle changes as needed to promote independence and self-management of healthcare. SDOH screenings have been completed and addressed if indicted.  There are no reported barriers to care.    Follow-up Plan [facility] Case Management Nurse will provide follow-up and on-going assessment ,evaluation and education of disease processes, recommended interventions for both chronic and acute medical conditions ,  along with ongoing review of symptoms ,medication reviews / reconciliation during each weekly call .  Any updates , inconsistencies,  discrepancies or acute care concerns will be addressed and routed to the correct Practitioner if indicated   [facility]  Please call the care guide team at [phone]  if you need to cancel or reschedule your appointment . For scheduled calls -Three attempts will be made to reach you -if the scheduled call is missed or  we are unable to reach the you after 3 attempts no additional outreach attempts will be made and the TOC follow-up will be closed .   If you need to speak to a Nurse you may  call me directly at the number below or if I am unavailable,and  your need is urgent  please call the main [facility] number at [phone] and ask to speak with one of the [place] ( Transition of Care )  Nurses  .  Patient was encouraged to Contact PCP with any changes in baseline or  medication regimen,  changes in health status  /  well-being, safety concerns, including falls any questions or concerns regarding ongoing medical care, any difficulty obtaining or picking up prescriptions, any changes or worsening in condition- including  symptoms not relieved  with interventions                                                                            Additionally, If you experience worsening of your symptoms, develop shortness of breath, If you are experiencing a medical emergency,  develop suicidal or homicidal thoughts you must seek medical attention immediately by calling 911 or report to your local emergency department or urgent care.   If you have a non-emergency medical problem during routine business hours, please contact your provider's office and ask to speak with a nurse.       Please take the time to read instructions/literature along with the possible adverse reactions/side effects for all the Medicines that have been prescribed to you. Only take newly prescribed  Medications after you have completely understood and accept all the possible adverse reactions/side effects.   Do not take more  than prescribed Medications for  Pain, Sleep and Anxiety. Do not drive when taking Pain medications or sleep aid/ insomnia  medications It is not advisable to combine anxiety, sleep and pain medications without talking with your primary care practitioner    If you are experiencing a Mental Health or Behavioral Health Crisis or need someone to talk to Please call the Suicide and Crisis Lifeline: 38 You may also call the [place]  National Suicide Prevention Lifeline: 346-488-7970 or TTY: 863-731-1211 TTY 5816333338) to talk to a trained counselor.  You may call the Behavioral Health Crisis Line at 406-783-9752, at any time, 24 hours a day, 7 days a week- however If you are in danger or need immediate medical attention, call 911.   If you would like help to quit smoking, call 1-800-QUIT-NOW ( 725-155-3213) OR Espaol: 1-855-Djelo-Ya (3-016-010-9323) o para ms informacin haga clic aqu or Text READY to 557-322 to register via text.   James Mcardle , BSN, RN [place]   [facility] RN Care Manager Direct Dial [phone]  Website: [email or web address]

## 2023-06-07 NOTE — Transitions of Care (Post Inpatient/ED Visit) (Signed)
 06/07/2023  Name: Margaret Cuevas MRN: [id number] DOB: Jul 21, 1975  Today's TOC FU Call Status: Today's TOC FU Call Status:: Successful TOC FU Call Completed TOC FU Call Complete Date: 06/07/23 Patient's Name and Date of Birth confirmed.  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, unspecified How have you been since you were released from the hospital?: Better Any questions or concerns?: No  Items Reviewed: Did you receive and understand the discharge instructions provided?: Yes Medications obtained,verified, and reconciled?: Yes (Medications Reviewed) Any new allergies since your discharge?: No Dietary orders reviewed?: Yes Type of Diet Ordered:: modified liquid diet phase Do you have support at home?: Yes People in Home: spouse, child(ren), adult Name of Support/Comfort Primary Source: Spouse Dwayne, Mother and 2 sons  Medications Reviewed Today: Medications Reviewed Today     Reviewed by Grafton Lawrence, RN (Registered Nurse) on 06/07/23 at 1403  Med List Status: <None>   Medication Order Taking? Sig Documenting Provider Last Dose Status Informant  acetaminophen  (TYLENOL ) 650 MG CR tablet [id number] Yes Take 1,300 mg by mouth every 8 (eight) hours as needed for pain. [provider] Taking Active Self  busPIRone  (BUSPAR ) 7.5 MG tablet [id number] Yes Take 1 tablet (7.5 mg total) by mouth 2 (two) times daily. Clarise Crooks, MD Taking Active Self  celecoxib  (CELEBREX ) 100 MG capsule [id number] No Take 100 mg by mouth 2 (two) times daily. Rheum  Patient not taking: Reported on 06/07/2023   [provider] Not Taking Active Self  cyclobenzaprine  (FLEXERIL ) 10 MG tablet [id number] Yes TAKE 1 TABLET(10  MG) BY MOUTH THREE TIMES DAILY AS NEEDED FOR MUSCLE SPASMS Noble Bateman, PA Taking Active Self  DULoxetine  (CYMBALTA ) 30 MG capsule [id number] Yes Take 1 capsule (30 mg total) by mouth daily. Total of 90 mg daily, take along with 60 mg cap Hisada, Reina, MD Taking Active Self  DULoxetine  (CYMBALTA ) 60 MG capsule [id number] Yes Take 60 mg by mouth See admin instructions. Take with 30 mg for a total of 90 mg daily [provider] Taking Active Self  enoxaparin  (LOVENOX ) 40 MG/0.4ML injection [id number] Yes Inject into the skin. [provider] Taking Active   estradiol  (VIVELLE -DOT) 0.1 MG/24HR patch [id number] Yes Place 1 patch (0.1 mg total) onto the skin 2 (two) times a week. Dr Madelene Schanz Schermerhorn, Joselyn Nicely, MD Taking Active Self  gabapentin  (NEURONTIN ) 600 MG tablet [id number] Yes Take 2 tablets (1,200 mg total) by mouth 3 (three) times daily. Jodeen Munch, MD Taking Active   Multiple Vitamin (MULTI-VITAMIN) tablet [id number] Yes Take 1 tablet by mouth daily. Bariatric Chewable [provider] Taking Active Self  Multiple Vitamins-Minerals (CITRACAL +D3 PO) [id number] Yes Take 1,200 mg by mouth 2 (two) times daily. [provider] Taking Active Self  omeprazole (PRILOSEC) 40 MG capsule [id number] Yes Take 40 mg by mouth daily. [provider] Taking Active Self  oxyCODONE  (ROXICODONE ) 5 MG immediate release tablet [id number] Yes Take 1 tablet (5 mg total) by mouth every 6 (six) hours as needed for severe pain (pain score 7-10). Lucetta Russel, PA-C Taking Active   polyethylene glycol (MIRALAX  / GLYCOLAX ) 17 g packet [id number] Yes Take by mouth. [provider] Taking Active   senna (SENOKOT) 8.6 MG TABS tablet [id number] No Take 1 tablet (8.6 mg total) by mouth 2 (two)  times daily.  Patient not taking: Reported on 06/07/2023   Noble Bateman, [place] Not Taking Active Self  sucralfate  (CARAFATE ) 1 g tablet [id number] No Take 1 g by mouth 4  (four) times daily -  with meals and at bedtime.  Patient not taking: Reported on 06/07/2023   [provider] Not Taking Active Self  triamcinolone  (NASACORT ) 55 MCG/ACT AERO nasal inhaler [id number] No Place 2 sprays into the nose daily.  Patient not taking: Reported on 06/07/2023   Clarise Crooks, MD Not Taking Active Self           Med Note [place]   Fri Apr 02, 2023 12:32 PM) Have not started          Medication reconciliation / review completed based on most recent discharge summary and EHR medication list. Confirmed patient is taking all newly prescribed medications as instructed (any discrepancies are noted in review section)   Patient / Caregiver is aware of any changes to and / or  any dosage adjustments to medication regimen. Patient/ Caregiver denies questions at this time and reports no barriers to medication adherence.   Home Care and Equipment/Supplies: Were Home Health Services Ordered?: No Any new equipment or medical supplies ordered?: No  Functional Questionnaire: Do you need assistance with bathing/showering or dressing?: Yes (SBA) Do you need assistance with meal preparation?: No Do you need assistance with eating?: No Do you have difficulty maintaining continence: No Do you need assistance with getting out of bed/getting out of a chair/moving?: Yes (1 person) Do you have difficulty managing or taking your medications?: No  Follow up appointments reviewed: PCP Follow-up appointment confirmed?: No (She declined stated she will follow-up with her surgeon) MD Provider Line [phone] Given: No Specialist Hospital Follow-up appointment confirmed?: Yes Date of Specialist follow-up appointment?: 06/22/23 Follow-Up Specialty Provider:: [facility] Do you need transportation to your follow-up appointment?: No (spouse and sons drive)  SDOH Interventions Today    Flowsheet Row Most Recent Value   SDOH Interventions   Food Insecurity Interventions Intervention Not Indicated  Housing Interventions Intervention Not Indicated  Transportation Interventions Intervention Not Indicated, Patient Resources (Friends/Family)  Utilities Interventions Intervention Not Indicated  Social Connections Interventions Other (Comment)       Goals Addressed             This Visit's Progress    TOC Care Plan       Current Barriers:  Provider appointments surgical and Bariatric follow-up Equipment/DME walker, recliner, mobility assistance  Functional/Safety risk falls SDOH (other) social isolation , lonely, needs support at home due to physical limitations   RNCM Clinical Goal(s):  Patient will work with the Care Management team over the next 30 days to address Transition of Care Barriers: Medication Management Support at home Provider appointments take all medications exactly as prescribed and will call provider for medication related questions as evidenced by no missed medication doses  attend all scheduled medical appointments: with surgeon and Bariatric follow-up as evidenced by no missed follow-up work with Child psychotherapist to address  related to the management of Limited social support, Transportation, and ADL IADL limitations management of household tasks and social activity  related to the recent surgery and physical restrictions  as evidenced by review of EMR and patient or Child psychotherapist report through collaboration with Medical illustrator, provider, and care team.   Interventions: Evaluation of current treatment plan related to  self management and patient's adherence  to plan as established by provider  Transitions of Care:  New goal. Community Resource Referral Made to address Social Connections Physical Activity Doctor Visits  - discussed the importance of doctor visits Post discharge activity limitations prescribed by provider reviewed Post-op wound/incision care reviewed with  patient/caregiver Reviewed Signs and symptoms of infection  Patient Goals/Self-Care Activities: Participate in Transition of Care Program/Attend TOC scheduled calls Take all medications as prescribed Attend all scheduled provider appointments Call pharmacy for medication refills 3-7 days in advance of running out of medications Attend church or other social activities Perform all self care activities independently  Perform IADL's (shopping, preparing meals, housekeeping, managing finances) independently Call provider office for new concerns or questions   Follow Up Plan:  Telephone follow up appointment with care management team member scheduled for:  06/16/23 @ 1:00pm The patient has been provided with contact information for the care management team and has been advised to call with any health related questions or concerns.           Patient is at high risk for readmission and/or has history of  high utilization  Discussed VBCI  TOC program and weekly calls to patient to assess condition/status, medication management  and provide support/education as indicated . Patient/ Caregiver voiced understanding and is  agreeable to 30 day program    Routine follow-up and on-going assessment evaluation and education of disease processes, recommended interventions for both chronic and acute medical conditions , will occur during each weekly visit along with ongoing review of symptoms ,medication reviews and reconciliation. Any updates , inconsistencies, discrepancies or acute care concerns will be addressed and routed to the correct Practitioner if indicated   Based on current information and Insurance plan -Reviewed benefits available to patient, including details about eligibility options for care if any area of needs were identified.  Reviewed patients ability to access and / or navigating the benefits system..Amb Referral made if indicted , refer to orders section of note for details   Please  refer to Care Plan for goals and interventions -Effectiveness of interventions, symptom management and outcomes will be evaluated  weekly during [facility] 30-day Program Outreach calls  . Any necessary  changes and updates to Care Plan will be completed episodically    Reviewed goals for care Patient verbalizes understanding of instructions and care plan provided. Patient was encouraged to make informed decisions about their care, actively participate in managing their health condition, and implement lifestyle changes as needed to promote independence and self-management of health care   Patient was encouraged to Contact PCP with any changes in baseline or  medication regimen,  changes in health status  /  well-being, safety concerns, including falls any questions or concerns regarding ongoing medical care, any difficulty obtaining or picking up prescriptions, any changes or worsening in condition- including  symptoms not relieved  with interventions    The patient has been provided with contact information for the care management team and has been advised to call with any health-related questions or concerns. Follow up as indicated with Care Team , or sooner should any new problems arise.     James Mcardle , BSN, RN [facility]   VBCI-Population Health RN Care Manager Direct Dial [phone]  Fax: [phone] Website: [email or web address]

## 2023-06-08 ENCOUNTER — Telehealth: Payer: Self-pay | Admitting: *Deleted

## 2023-06-08 NOTE — Progress Notes (Signed)
Complex Care Management Note  Care Guide Note 06/08/2023 Name: Margaret Cuevas MRN: [id number] DOB: 03/09/1976  Margaret Cuevas is a 48 y.o. year old female who sees [name], MD for primary care. I reached out to [name] by phone today to offer complex care management services.  [name] was given information about Complex Care Management services today including:   The Complex Care Management services include support from the care team which includes your Nurse Care Manager, Clinical Social Worker, or Pharmacist.  The Complex Care Management team is here to help remove barriers to the health concerns and goals most important to you. Complex Care Management services are voluntary, and the patient may decline or stop services at any time by request to their care team member.   Complex Care Management Consent Status: Patient agreed to services and verbal consent obtained.   Follow up plan:  Telephone appointment with complex care management team member scheduled for:  06/14/2023  Encounter Outcome:  Patient Scheduled  Burman Nieves, CMA, Care Guide [facility]  [facility], [facility] Guide Direct Dial: [phone]  Fax: [phone] Website: [email or web address]
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
## 2023-06-11 ENCOUNTER — Ambulatory Visit: Payer: Federal, State, Local not specified - PPO | Admitting: Psychology

## 2023-06-14 ENCOUNTER — Ambulatory Visit: Payer: Self-pay | Admitting: *Deleted
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
## 2023-06-14 NOTE — Patient Instructions (Signed)
Visit Information  Thank you for taking time to visit with me today. Please don't hesitate to contact me if I can be of assistance to you.   Following are the goals we discussed today:   Goals Addressed             This Visit's Progress    Community resource needs       Activities and task to complete in order to accomplish goals.   EMOTIONAL / MENTAL HEALTH SUPPORT Call your current therapist and psychiatrist to schedule your follow up counseling appointment. Keep all upcoming appointment discussed today Continue with compliance of taking medication prescribed by Doctor Please review list of resources for housekeeping/private duty care once received         Our next appointment is by telephone on 06/28/23 at 11am  Please call the care guide team at [phone] if you need to cancel or reschedule your appointment.   If you are experiencing a Mental Health or Behavioral Health Crisis or need someone to talk to, please call 911   Patient verbalizes understanding of instructions and care plan provided today and agrees to view in MyChart. Active MyChart status and patient understanding of how to access instructions and care plan via MyChart confirmed with patient.     Telephone follow up appointment with care management team member scheduled for: 06/28/23  Verna Czech, LCSW [place]  [facility] Licensed Clinical Social Worker Care Coordinator  Direct Dial: [phone]

## 2023-06-15 ENCOUNTER — Other Ambulatory Visit: Payer: Self-pay

## 2023-06-15 NOTE — Patient Outreach (Signed)
Care Management  Transitions of Care Program Transitions of Care Post-discharge week 2   06/15/2023 Name: Margaret Cuevas MRN: [id number] DOB: 08-Nov-1975  Subjective: Margaret Cuevas is a 48 y.o. year old female who is a primary care patient of [name], MD. The Care Management team Engaged with patient Engaged with patient by telephone to assess and address transitions of care needs.   Consent to Services:  Patient was given information about care management services, agreed to services, and gave verbal consent to participate.   Assessment:   Patient/ Caregiver  voices no new complaints or concerns  and has not developed/ reported any new medical issues / Dx or acute changes. - since last follow-up call for most recent Hospital stay   2/6-2/8 / 2025 She reports constipation this am She is using Miralax and had a difficult time having a BM. She is not using Senna ( per her) at the direction of the surgeon. Encouraged  her to call surgeon and update them on her concerns with constipation. She is still reporting back pain and physical limitations. She is upset over her inability to perform IADL's to her preference She is followed by a MH therapist with visit scheduled 2/20 She had a call with CI SW and was hoping to get a list of resources for help at home Her spouse is a Cytogeneticist but she  states he will not allow her to request assistance with them. She is using her spinal stimulator  with some effect . She is still advancing her diet She is drinking protein shakes but not solid foods yet  Medication reconciliation/ review completed based on most recent medication list in EHR; and in review of recent Provider follow-up appointments- confirmed patient obtained / is taking all newly prescribed medications as instructed and is aware of any changes to previous medications regimen including dosage adjustments. Patient self-Caregiver manages medications; denies questions/ concerns or barriers to medication  adherence at this time  Patient / Caregiver educated on red flag s/s to watch for based on current discharge diagnosis and was encouraged to report, any changes in baseline or  medication regimen,   or any new unmanaged side effects or symptoms not relieved with interventions  to PCP and / or the  [facility] Case Management team .          SDOH Interventions    Flowsheet Row Patient Outreach from 06/15/2023 in [place] [facility] DEPARTMENT Care Coordination from 06/14/2023 in [facility] Community Care Coordination Telephone from 06/07/2023 in [facility]PART[facility] Telephone from 11/16/2022 in [facility] Community Care Coordination Care Coordination from 01/02/2022 in [facility] Care Coordination Office Visit from 05/15/2021 in [facility] Primary [facility] & [facility] at [facility]  SDOH Interventions        Food Insecurity Interventions -- Intervention Not Indicated Intervention Not Indicated Intervention Not Indicated Intervention Not Indicated --  Housing Interventions -- Intervention Not Indicated Intervention Not Indicated Intervention Not Indicated Intervention Not Indicated --  Transportation Interventions -- Intervention Not Indicated, Patient Resources (Friends/Family) Intervention Not Indicated, Patient Resources (Friends/Family) Intervention Not Indicated, Patient Resources (Friends/Family) Intervention Not Indicated --  Utilities Interventions Intervention Not Indicated -- Intervention Not Indicated -- Intervention Not Indicated --  Depression Interventions/Treatment  -- -- -- -- -- Currently on Treatment  Social Connections Interventions -- -- Other (Comment) -- -- --        Goals Addressed  This Visit's Progress    TOC Care Plan       Current Barriers:  Provider appointments surgical and Bariatric follow-up Equipment/DME walker, recliner, mobility assistance  Functional/Safety risk  falls SDOH (other) social isolation , lonely, needs support at home due to physical limitations   RNCM Clinical Goal(s):  Patient will work with the Care Management team over the next 30 days to address Transition of Care Barriers: Medication Management Support at home Provider appointments take all medications exactly as prescribed and will call provider for medication related questions as evidenced by no missed medication doses  attend all scheduled medical appointments: with surgeon and Bariatric follow-up as evidenced by no missed follow-up work with Child psychotherapist to address  related to the management of Limited social support, Transportation, and ADL IADL limitations management of household tasks and social activity  related to the recent surgery and physical restrictions  as evidenced by review of EMR and patient or Child psychotherapist report through collaboration with Medical illustrator, provider, and care team.   Interventions: Evaluation of current treatment plan related to  self management and patient's adherence to plan as established by provider  Transitions of Care:  Goal on track:  Yes. Community Resource Referral Made to address Social Connections Physical Activity Doctor Visits  - discussed the importance of doctor visits Post discharge activity limitations prescribed by provider reviewed Post-op wound/incision care reviewed with patient/caregiver Reviewed Signs and symptoms of infection  Patient Goals/Self-Care Activities: Participate in Transition of Care Program/Attend TOC scheduled calls Take all medications as prescribed Attend all scheduled provider appointments Call pharmacy for medication refills 3-7 days in advance of running out of medications Attend church or other social activities Perform all self care activities independently  Perform IADL's (shopping, preparing meals, housekeeping, managing finances) independently Call provider office for new concerns or questions    Follow Up Plan:  Telephone follow up appointment with care management team member scheduled for:  06/22/23 @ 1:00pm The patient has been provided with contact information for the care management team and has been advised to call with any health related questions or concerns.          Plan:  Routine follow-up and on-going assessment evaluation and education of disease processes, recommended interventions for both chronic and acute medical conditions , will occur during each weekly visit along with ongoing review of symptoms ,medication reviews and reconciliation. Any updates , inconsistencies, discrepancies or acute care concerns will be addressed and routed to the correct Practitioner if indicated   Based on current information and Insurance plan -Reviewed benefits available to patient, including details about eligibility options for care if any area of needs were identified.  Reviewed patients ability to access and / or navigating the benefits system..Amb Referral made if indicted , refer to orders section of note for details   Please refer to Care Plan for goals and interventions -Effectiveness of interventions, symptom management and outcomes will be evaluated  weekly during [facility] 30-day Program Outreach calls  . Any necessary  changes and updates to Care Plan will be completed episodically    Reviewed goals for care Patient verbalizes understanding of instructions and care plan provided. Patient was encouraged to make informed decisions about their care, actively participate in managing their health condition, and implement lifestyle changes as needed to promote independence and self-management of health care   Patient was encouraged to Contact PCP with any changes in baseline or  medication regimen,  changes in health status  /  well-being, safety concerns, including falls any questions or concerns regarding ongoing medical care, any difficulty obtaining or picking up prescriptions, any  changes or worsening in condition- including  symptoms not relieved  with interventions    The patient has been provided with contact information for the care management team and has been advised to call with any health-related questions or concerns. Follow up as indicated with Care Team , or sooner should any new problems arise.   The patient has been provided with contact information for the care management team and has been advised to call with any health related questions or concerns.   Susa Loffler , BSN, RN [facility]   VBCI-Population Health RN Care Manager Direct Dial [phone]  Fax: [phone] Website: [email or web address]

## 2023-06-16 ENCOUNTER — Encounter: Payer: Self-pay | Admitting: Psychiatry

## 2023-06-16 ENCOUNTER — Telehealth (INDEPENDENT_AMBULATORY_CARE_PROVIDER_SITE_OTHER): Payer: Federal, State, Local not specified - PPO | Admitting: Psychiatry

## 2023-06-16 ENCOUNTER — Other Ambulatory Visit: Payer: Federal, State, Local not specified - PPO

## 2023-06-16 DIAGNOSIS — F320 Major depressive disorder, single episode, mild: Secondary | ICD-10-CM

## 2023-06-16 DIAGNOSIS — F419 Anxiety disorder, unspecified: Secondary | ICD-10-CM

## 2023-06-16 MED ORDER — DULOXETINE HCL 30 MG PO CPEP: 30.0000 mg | ORAL_CAPSULE | Freq: Every day | ORAL | 0 refills | Status: DC

## 2023-06-16 NOTE — Patient Instructions (Signed)
Continue duloxetine 90 mg daily Continue BuSpar 7.5 mg twice a day Referral to therapy Next appointment: 4/9 at 9 am

## 2023-06-17 ENCOUNTER — Ambulatory Visit: Payer: Federal, State, Local not specified - PPO | Admitting: Psychology

## 2023-06-17 DIAGNOSIS — F4323 Adjustment disorder with mixed anxiety and depressed mood: Secondary | ICD-10-CM

## 2023-06-17 NOTE — Progress Notes (Signed)
[facility] Counselor/Therapist Progress Note  Patient ID: Margaret Cuevas, MRN: [id number],    Date: 06/17/2023  Time Spent: 30 mins start time: 0800    end time: 0830  Treatment Type: Individual Therapy  Reported Symptoms: Pt presents for session, via Caregility video, granting consent for the session.  Pt states she is in her home with no one else present; she also shares she understands the limits of virtual sessions.  I shared with pt that I am in my office with no one else here.  Mental Status Exam: Appearance:  Casual     Behavior: Appropriate  Motor: Normal  Speech/Language:  Clear and Coherent  Affect: Appropriate  Mood: normal  Thought process: normal  Thought content:   WNL  Sensory/Perceptual disturbances:   WNL  Orientation: oriented to person, place, and time/date  Attention: Good  Concentration: Good  Memory: WNL  Fund of knowledge:  Good  Insight:   Good  Judgment:  Good  Impulse Control: Good   Risk Assessment: Danger to Self:  No Self-injurious Behavior: No Danger to Others: No Duty to Warn:no Physical Aggression / Violence:No  Access to Firearms a concern: No  Gang Involvement:No   Subjective: Pt shares that she "had my gastric surgery on 2/6 and I have been home recovering.  It turned out to be more than I or the surgeon thought it would be.  The surgeon had to do a lot more work than she thought she would have to do.  As a result, my recovery has been harder.  Also as a result, my back and my hips have been giving me more trouble than before the procedure.  It has been a lot."  Pt shares that her back really stiffened up while going through this.  "My spinal cord stimulator has been a blessing but I have also had pain in my lower back and my hips."  Pt shares she has a follow up visit with her gastric surgeon on Tuesday (2/25) "and if she says that it is OK, my neurosurgeon will order injections for my lower back and hips."  She is hopeful that  the back injections will be beneficial for her pain.  Pt is thinking that joining a local gym (with her husband) with an indoor pool would be beneficial for her for exercise, after she is medically cleared.  Pt shares that she met with Dr. Vanetta Shawl and there were no changes in pt's medication or dosages.  Pt continues to take her Bupropion and her Cymbalta daily.  Pt shares that she has been watching TV as a self care activity.  Encouraged pt to remember to use her deep breathing to help her relax and to breathe through her pain episodes.  Encouraged pt to continue with her self care activities and she will call the office for a follow up session, after she meets with her gastric surgeon next week.  Interventions: Cognitive Behavioral Therapy  Diagnosis:Adjustment disorder with mixed anxiety and depressed mood  Plan: Treatment Plan Strengths/Abilities:  Intelligent, Intuitive, Willing to participate in therapy Treatment Preferences:  Outpatient Individual Therapy Statement of Needs:  Patient is to use CBT, mindfulness and coping skills to help manage and/or decrease symptoms associated with their diagnosis. Symptoms:  Depressed/Irritable mood, worry, social withdrawal Problems Addressed:  Depressive thoughts, Sadness, Sleep issues, etc. Long Term Goals:  Pt to reduce overall level, frequency, and intensity of the feelings of depression/anxiety as evidenced by decreased irritability, negative self talk, and helpless feelings  from 6 to 7 days/week to 0 to 1 days/week, per client report, for at least 3 consecutive months.  Progress: 30% Short Term Goals:  Pt to verbally express understanding of the relationship between feelings of depression/anxiety and their impact on thinking patterns and behaviors.  Pt to verbalize an understanding of the role that distorted thinking plays in creating fears, excessive worry, and ruminations.  Progress: 30% Target Date:  12/23/2023 Frequency:  Bi-weekly Modality:   Cognitive Behavioral Therapy Interventions by Therapist:  Therapist will use CBT, Mindfulness exercises, Coping skills and Referrals, as needed by client. Client has verbally approved this treatment plan.  Karie Kirks, [facility]

## 2023-06-19 ENCOUNTER — Encounter: Payer: Self-pay | Admitting: Neurosurgery

## 2023-06-21 ENCOUNTER — Telehealth: Payer: Self-pay | Admitting: Orthopedic Surgery

## 2023-06-21 ENCOUNTER — Other Ambulatory Visit: Payer: Self-pay

## 2023-06-21 DIAGNOSIS — G894 Chronic pain syndrome: Secondary | ICD-10-CM

## 2023-06-21 DIAGNOSIS — M961 Postlaminectomy syndrome, not elsewhere classified: Secondary | ICD-10-CM

## 2023-06-21 DIAGNOSIS — Z981 Arthrodesis status: Secondary | ICD-10-CM

## 2023-06-21 DIAGNOSIS — Z9689 Presence of other specified functional implants: Secondary | ICD-10-CM

## 2023-06-21 MED ORDER — OXYCODONE HCL 5 MG PO TABS: 5.0000 mg | ORAL_TABLET | Freq: Four times a day (QID) | ORAL | 0 refills | Status: DC | PRN

## 2023-06-21 NOTE — Telephone Encounter (Signed)
 Patient notified

## 2023-06-21 NOTE — Telephone Encounter (Signed)
 She was given a few oxycodone after her hospital discharge. She would be out. PMP reviewed and is appropriate.   Refill of oxycodone sent to pharmacy. Please let her know.

## 2023-06-22 ENCOUNTER — Encounter: Payer: Self-pay | Admitting: Orthopedic Surgery

## 2023-06-22 DIAGNOSIS — Z981 Arthrodesis status: Secondary | ICD-10-CM

## 2023-06-22 DIAGNOSIS — G894 Chronic pain syndrome: Secondary | ICD-10-CM

## 2023-06-22 DIAGNOSIS — Z8639 Personal history of other endocrine, nutritional and metabolic disease: Secondary | ICD-10-CM | POA: Diagnosis not present

## 2023-06-22 DIAGNOSIS — Z48815 Encounter for surgical aftercare following surgery on the digestive system: Secondary | ICD-10-CM | POA: Diagnosis not present

## 2023-06-22 DIAGNOSIS — Z9689 Presence of other specified functional implants: Secondary | ICD-10-CM

## 2023-06-22 DIAGNOSIS — F331 Major depressive disorder, recurrent, moderate: Secondary | ICD-10-CM | POA: Diagnosis not present

## 2023-06-22 DIAGNOSIS — Z9889 Other specified postprocedural states: Secondary | ICD-10-CM | POA: Diagnosis not present

## 2023-06-22 NOTE — Telephone Encounter (Signed)
 Referral was faxed to [facility] in [place].

## 2023-06-22 NOTE — Telephone Encounter (Signed)
 Please send pain clinic referral that I did today (medical  management of pain) to [facility] in [place].   Thanks!

## 2023-06-22 NOTE — Addendum Note (Signed)
 Addended byDrake Leach on: 06/22/2023 12:45 PM   Modules accepted: Orders

## 2023-06-22 NOTE — Telephone Encounter (Signed)
 Reviewed patient with Dr. Myer Haff regarding her oxycodone that we have been prescribing. She is using appropriately.   Recommend that she get in with pain management for continued treatment of chronic pain. We will prescribe oxycodone as needed until she is able to see them.   Message sent to patient.

## 2023-06-23 ENCOUNTER — Other Ambulatory Visit: Payer: Self-pay

## 2023-06-23 ENCOUNTER — Other Ambulatory Visit: Payer: Federal, State, Local not specified - PPO | Admitting: *Deleted

## 2023-06-23 NOTE — Patient Outreach (Signed)
 Care Management  Transitions of Care Program Transitions of Care Post-discharge week 3   06/23/2023 Name: Margaret Cuevas MRN: [id number] DOB: Jan 16, 1976  Subjective: Margaret Cuevas is a 48 y.o. year old female who is a primary care patient of [name], MD. The Care Management team Engaged with patient Engaged with patient by telephone to assess and address transitions of care needs.   Consent to Services:  Patient was given information about care management services, agreed to services, and gave verbal consent to participate.   Assessment:   Patient/ Caregiver  voices no new complaints or concerns  and has not developed/ reported any new medical issues / Dx or acute changes. - since last follow-up call for most recent  Hospital stay   2/6-2/8 / 2025  No reported or reviewed Hospital Readmissions / No Urgent Care Visits / Transfers for Acute Change in condition .   MD/Specialist or Consultant visits reported since last follow-up call. She saw surgeon yesterday ( Bariatric) Nutritionist and MH therapist during visit @ [facility]. She is able to advance her diet to soft foods, She stopped and ate at a restaurant yesterday and tolerated meal well. Patient was Alert & O x 4 ,responsive, able to voice needs, cooperative with visit   There were no noted or reported cognitive/ mental status changes during this assessment Patient denies changes in ADL function or IADL;s  at this time.  No reports of new onset or increased / uncontrolled pain, has chronic pain  somewhat managed with medicinal and non-medicinal interventions. She reports she  has a referral for [facility] and a referral for SI injections She will follow-up with both tomorrow if she does not hear today. She continues to use her spinal stimulator with minimal effect   was recommended she no longer take Cymbalta She has sent a message to both her Rheumatologist and Neurologist for an alternative She does not feel the buspar BID is  effective for anxiety.  She has follow-up with [facility] SW 3/3.  No changes in skin, no rashes, wounds or open areas - her surgical incision is healed.  Diet reviewed and  education provided . She is aware of need to eat 60gm protein and fluid intake of 64 oz / day. She hadreported constipation last week. She discussed this with her physician yesterday New orders for Colace   Therapy Services NA   Medication reconciliation/ review completed based on most recent medication list in EHR; and in review of recent Provider follow-up appointments- confirmed patient obtained / is taking all newly prescribed medications as instructed and is aware of any changes to previous medications regimen including dosage adjustments. Reports new changes in medication/treatment  regimen  during this assessment period.  Changes noted-Add docusate (COLACE) 100 MG capsule  Take 1 capsule (100 mg total) by mouth 2 (two) times daily for 30 days   Medications and current treatments are semi effective for use intended. No adverse Drug reactions  reported    Patient self manages  medications Patient is able to take medications without difficulty;  denies questions/ concerns and reports no barriers to medication adherence at this time  Patient  educated on red flag s/s to watch for based on current discharge diagnosis and was encouraged to report, any changes in baseline or  medication regimen,   or any new unmanaged side effects or symptoms not relieved with interventions  to PCP and / or the  [facility] Case Management team .  SDOH Interventions    Flowsheet Row Patient Outreach from 06/15/2023 in [place] POPULATION HEALTH DEPARTMENT Care Coordination from 06/14/2023 in [facility] Community Care Coordination Telephone from 06/07/2023 in [facility] POPUL[facility] HEALTH DEPARTMENT Telephone from 11/16/2022 in [facility] Community Care Coordination Care Coordination from 01/02/2022 in [facility] Community Care Coordination Office Visit from 05/15/2021 in [facility] Primary Care & Sports Medicine at [facility]  SDOH Interventions        Food Insecurity Interventions -- Intervention Not Indicated Intervention Not Indicated Intervention Not Indicated Intervention Not Indicated --  Housing Interventions -- Intervention Not Indicated Intervention Not Indicated Intervention Not Indicated Intervention Not Indicated --  Transportation Interventions -- Intervention Not Indicated, Patient Resources (Friends/Family) Intervention Not Indicated, Patient Resources (Friends/Family) Intervention Not Indicated, Patient Resources Dietitian) Intervention Not Indicated --  Utilities Interventions Intervention Not Indicated -- Intervention Not Indicated -- Intervention Not Indicated --  Depression Interventions/Treatment  -- -- -- -- -- Currently on Treatment  Social Connections Interventions -- -- Other (Comment) -- -- --        Goals Addressed             This Visit's Progress    TOC Care Plan       Current Barriers:  Provider appointments surgical and Bariatric follow-up Equipment/DME walker, recliner, mobility assistance  Functional/Safety risk falls SDOH (other) social isolation , lonely, needs support at home due to physical limitations   RNCM Clinical Goal(s):  Patient will work with the Care Management team over the next 30 days to address Transition of Care Barriers: Medication Management Support at home Provider appointments take all medications exactly as prescribed and will call provider for medication related questions as evidenced by no missed medication doses  attend all scheduled medical appointments: with surgeon and Bariatric follow-up as evidenced by no missed follow-up work with Child psychotherapist to address  related to the management of Limited social support, Transportation, and ADL IADL limitations management of household tasks and social activity  related to  the recent surgery and physical restrictions  as evidenced by review of EMR and patient or Child psychotherapist report through collaboration with Medical illustrator, provider, and care team.   Interventions: Evaluation of current treatment plan related to  self management and patient's adherence to plan as established by provider  Transitions of Care:  Goal on track:  Yes. Community Resource Referral Made to address Social Connections Physical Activity Doctor Visits  - discussed the importance of doctor visits Post discharge activity limitations prescribed by provider reviewed Post-op wound/incision care reviewed with patient/caregiver Reviewed Signs and symptoms of infection  Patient Goals/Self-Care Activities: Participate in Transition of Care Program/Attend TOC scheduled calls Take all medications as prescribed Attend all scheduled provider appointments Call pharmacy for medication refills 3-7 days in advance of running out of medications Attend church or other social activities Perform all self care activities independently  Perform IADL's (shopping, preparing meals, housekeeping, managing finances) independently Call provider office for new concerns or questions   Follow Up Plan:  Telephone follow up appointment with care management team member scheduled for:  07/01/23 @ 11:00am The patient has been provided with contact information for the care management team and has been advised to call with any health related questions or concerns.          Plan:  She will follow-up with Pain Center and Specialist regarding SI injections. SW call 3/3  She will advance diet to soft foods and  begin Colace to help with constipation   Routine follow-up and on-going assessment evaluation and education of disease processes, recommended interventions for both chronic and acute medical conditions , will occur during each weekly visit along with ongoing review of symptoms ,medication reviews and  reconciliation. Any updates , inconsistencies, discrepancies or acute care concerns will be addressed and routed to the correct Practitioner if indicated   Based on current information and Insurance plan -Reviewed benefits available to patient, including details about eligibility options for care if any area of needs were identified.  Reviewed patients ability to access and / or navigating the benefits system..Amb Referral made if indicted , refer to orders section of note for details   Please refer to Care Plan for goals and interventions -Effectiveness of interventions, symptom management and outcomes will be evaluated  weekly during [facility] 30-day Program Outreach calls  . Any necessary  changes and updates to Care Plan will be completed episodically    Reviewed goals for care Patient verbalizes understanding of instructions and care plan provided. Patient was encouraged to make informed decisions about their care, actively participate in managing their health condition, and implement lifestyle changes as needed to promote independence and self-management of health care   Patient was encouraged to Contact PCP with any changes in baseline or  medication regimen,  changes in health status  /  well-being, safety concerns, including falls any questions or concerns regarding ongoing medical care, any difficulty obtaining or picking up prescriptions, any changes or worsening in condition- including  symptoms not relieved  with interventions    The patient has been provided with contact information for the care management team and has been advised to call with any health-related questions or concerns. Follow up as indicated with Care Team , or sooner should any new problems arise. The patient has been provided with contact information for the care management team and has been advised to call with any health related questions or concerns.   Susa Loffler , BSN, RN [facility]   VBCI-Population Health RN Care  Manager Direct Dial [phone]  Fax: [phone] Website: [email or web address]

## 2023-06-25 ENCOUNTER — Encounter: Payer: Self-pay | Admitting: Neurosurgery

## 2023-06-25 DIAGNOSIS — G4733 Obstructive sleep apnea (adult) (pediatric): Secondary | ICD-10-CM | POA: Diagnosis not present

## 2023-06-28 ENCOUNTER — Ambulatory Visit: Payer: Self-pay | Admitting: *Deleted

## 2023-06-28 NOTE — Patient Instructions (Signed)
 Visit Information  Thank you for taking time to visit with me today. Please don't hesitate to contact me if I can be of assistance to you.   Following are the goals we discussed today:   Goals Addressed             This Visit's Progress    Community resource needs       Activities and task to complete in order to accomplish goals.   EMOTIONAL / MENTAL HEALTH SUPPORT Continue to follow up with your current therapist and psychiatrist to schedule your follow up counseling appointment. Keep all upcoming appointment discussed today Continue with compliance of taking medication prescribed by Doctor Please review list of resources for housekeeping/private duty care          Our next appointment is by telephone on 07/06/23 at 10:30am  Please call the care guide team at [phone] if you need to cancel or reschedule your appointment.   If you are experiencing a Mental Health or Behavioral Health Crisis or need someone to talk to, please call 911   Patient verbalizes understanding of instructions and care plan provided today and agrees to view in MyChart. Active MyChart status and patient understanding of how to access instructions and care plan via MyChart confirmed with patient.     Telephone follow up appointment with care management team member scheduled for: 07/06/23  Verna Czech, LCSW [place]  [facility], [facility] Licensed Clinical Social Worker Care Coordinator  Direct Dial: [phone]     \

## 2023-06-28 NOTE — Patient Outreach (Signed)
 Care Coordination   Follow Up Visit Note   06/28/2023 Name: Margaret Cuevas MRN: [id number] DOB: 04/03/1976  Margaret Cuevas is a 48 y.o. year old female who sees [name], MD for primary care. I spoke with  Margaret Cuevas by phone today.  What matters to the patients health and wellness today?  Patient verbalized concern regarding follow up care to manage her pain    Goals Addressed             This Visit's Progress    Community resource needs       Activities and task to complete in order to accomplish goals.   EMOTIONAL / MENTAL HEALTH SUPPORT Continue to follow up with your current therapist and psychiatrist to schedule your follow up counseling appointment. Keep all upcoming appointment discussed today Continue with compliance of taking medication prescribed by Doctor Please review list of resources for housekeeping/private duty care          SDOH assessments and interventions completed:  No     Care Coordination Interventions:  Yes, provided  Interventions Today    Flowsheet Row Most Recent Value  Chronic Disease   Chronic disease during today's visit Other  [chronic pain]  General Interventions   General Interventions Discussed/Reviewed General Interventions Reviewed, Community Resources  [Increased pain different that the norm-crippling pain-continues  to follow up with rheumatologist, neurologist and pain management-concerned that pain is not under control and no cause-difficulty transtioning from bed to chair, poor sleep]  Doctor Visits Discussed/Reviewed Doctor Visits Discussed  [referral to pain management-appt this month]  Level of Care Personal Care Services  [received list for housekeeping n the mail, her children help as welll]  Mental Health Interventions   Mental Health Discussed/Reviewed Mental Health Reviewed, Depression, Other  [Patient continues to be active with a therapist and psychiatrist, continued frustration with pain that is not managed,  reports effort to "breath through it" emotional support provided, additional coping strategies explored]       Follow up plan: Follow up call scheduled for 07/07/23    Encounter Outcome:  Patient Visit Completed

## 2023-06-28 NOTE — Telephone Encounter (Signed)
 Please see Mychart message. Any further testing/imaging that you recommend?

## 2023-07-01 ENCOUNTER — Other Ambulatory Visit: Payer: Self-pay

## 2023-07-01 NOTE — Patient Instructions (Signed)
 Visit Information  Thank you for taking time to visit with me today. Please don't hesitate to contact me if I can be of assistance to you before our next scheduled telephone appointment.  Our next appointment is by telephone on 3/13 at 11:30am    MD Provider Line [phone] Following is a copy of your care plan:   Goals Addressed             This Visit's Progress    TOC Care Plan       Current Barriers:  Provider appointments surgical and Bariatric follow-up Equipment/DME walker, recliner, mobility assistance  Functional/Safety risk falls SDOH (other) social isolation , lonely, needs support at home due to physical limitations   RNCM Clinical Goal(s):  Patient will work with the Care Management team over the next 30 days to address Transition of Care Barriers: Medication Management Support at home Provider appointments take all medications exactly as prescribed and will call provider for medication related questions as evidenced by no missed medication doses  attend all scheduled medical appointments: with surgeon and Bariatric follow-up as evidenced by no missed follow-up work with Child psychotherapist to address  related to the management of Limited social support, Transportation, and ADL IADL limitations management of household tasks and social activity  related to the recent surgery and physical restrictions  as evidenced by review of EMR and patient or Child psychotherapist report through collaboration with Medical illustrator, provider, and care team.   Interventions: Evaluation of current treatment plan related to  self management and patient's adherence to plan as established by provider  Transitions of Care:  Goal on track:  Yes. Community Resource Referral Made to address Social Connections Physical Activity Doctor Visits  - discussed the importance of doctor visits Post discharge activity limitations prescribed by provider reviewed Post-op wound/incision care reviewed with  patient/caregiver Reviewed Signs and symptoms of infection  Patient Goals/Self-Care Activities: Participate in Transition of Care Program/Attend TOC scheduled calls Take all medications as prescribed Attend all scheduled provider appointments Call pharmacy for medication refills 3-7 days in advance of running out of medications Attend church or other social activities Perform all self care activities independently  Perform IADL's (shopping, preparing meals, housekeeping, managing finances) independently Call provider office for new concerns or questions   Follow Up Plan:  Telephone follow up appointment with care management team member scheduled for:  07/08/23 @ 11:30am The patient has been provided with contact information for the care management team and has been advised to call with any health related questions or concerns.         Medication review  Reviewed current home medications -- provided education as needed. Patient is aware of potential side effects and was encouraged to notify PCP for any adverse side effects or unwanted symptoms not relieved with interventions  Patient will call 911 for Medical Emergencies or Life -Threatening Symptoms.  Reviewed goals for care Patient/ Caregiver verbalizes understanding of instructions with the plan of care . The  Patient / Caregiver was encouraged to make informed decisions about care, actively participate in managing health conditions, and implement lifestyle changes as needed to promote independence and self-management of healthcare. SDOH screenings have been completed and addressed if indicted.  There are no reported barriers to care.    Follow-up Plan [facility]I Case Management Nurse will provide follow-up and on-going assessment ,evaluation and education of disease processes, recommended interventions for both chronic and acute medical conditions ,  along with ongoing review of symptoms ,medication  reviews / reconciliation during each  weekly call . Any updates , inconsistencies, discrepancies or acute care concerns will be addressed and routed to the correct Practitioner if indicated   The patient has been provided with contact information for the care management team and has been advised to call with any health-related questions or concerns. Follow up call  with Care Team  as scheduled,or sooner should any new problems arise.  [facility]  Please call the care guide team at [phone]  if you need to cancel or reschedule your appointment . For scheduled calls -Three attempts will be made to reach you -if the scheduled call is missed or  we are unable to reach the you after 3 attempts no additional outreach attempts will be made and the TOC follow-up will be closed .   If you need to speak to a Nurse you may  call me directly at the number below or if I am unavailable,and  your need is urgent  please call the main VBCI number at [phone] and ask to speak with one of the [facility] ( Transition of Care )  Nurses  .  Patient was encouraged to Contact PCP with any changes in baseline or  medication regimen,  changes in health status  /  well-being, safety concerns, including falls any questions or concerns regarding ongoing medical care, any difficulty obtaining or picking up prescriptions, any changes or worsening in condition- including  symptoms not relieved  with interventions                                                                            Additionally, If you experience worsening of your symptoms, develop shortness of breath, If you are experiencing a medical emergency,  develop suicidal or homicidal thoughts you must seek medical attention immediately by calling 911 or report to your local emergency department or urgent care.   If you have a non-emergency medical problem during routine business hours, please contact your provider's office and ask to speak with a nurse.       Please take the time to read  instructions/literature along with the possible adverse reactions/side effects for all the Medicines that have been prescribed to you. Only take newly prescribed  Medications after you have completely understood and accept all the possible adverse reactions/side effects.   Do not take more than prescribed Medications for  Pain, Sleep and Anxiety. Do not drive when taking Pain medications or sleep aid/ insomnia  medications It is not advisable to combine anxiety, sleep and pain medications without talking with your primary care practitioner    If you are experiencing a Mental Health or Behavioral Health Crisis or need someone to talk to Please call the Suicide and Crisis Lifeline: 988 You may also call the [place] National Suicide Prevention Lifeline: 559-144-3084 or TTY: 630-709-7816 TTY 831-184-5706) to talk to a trained counselor.  You may call the Behavioral Health Crisis Line at 705-064-1632, at any time, 24 hours a day, 7 days a week- however If you are in danger or need immediate medical attention, call 911.   If you would like help to quit smoking, call 1-800-QUIT-NOW ( 409-584-4755) OR Espaol: 1-855-Djelo-Ya (5-188-416-6063) o  para ms informacin haga clic aqu or Text READY to 782-956 to register via text.   Susa Loffler , BSN, RN [place]   VBCI-Population Health RN Care Manager Direct Dial [phone]  Website: [email or web address]

## 2023-07-01 NOTE — Patient Outreach (Signed)
 Care Management  Transitions of Care Program Transitions of Care Post-discharge week 4   07/01/2023 Name: Margaret Cuevas MRN: [id number] DOB: 1975-08-11  Subjective: Margaret Cuevas is a 48 y.o. year old female who is a primary care patient of [name], MD. The Care Management team Engaged with patient Engaged with patient by telephone to assess and address transitions of care needs.   Consent to Services:  Patient was given information about care management services, agreed to services, and gave verbal consent to participate.   Assessment:   Patient/ Caregiver  voices no new complaints or concerns  and has not developed/ reported any new medical issues / Dx or acute changes. - since last follow-up call for most recent   Hospital stay    2/6-2/8 / 2025. She is currently staying at her parents house for some additional R&R  No reported or reviewed Hospital Readmissions / No Urgent Care Visits / Transfers for Acute Change in condition .   No MD/Specialist or Consultant visits reported since last follow-up call- she will need a new PCP Dr Yetta Barre is retiring soon. She was encouraged to chose a provider ASAP She does want a new Provider  She has visits scheduled with  Pain Clinic 3/12  Fibromyalgia Clinic 3/11 fr possible SI Injection  She was enc to schedule a visit with orthopedic eval for possible hip replacement in the future  She has gotten a new Handicap tag  Patient was Alert & O x 4 ,responsive, able to voice needs, cooperative with visit   There were no noted or reported cognitive/ mental status changes during this assessment General status reviewed  patient presents as expected for age and current situation with  finding as noted in previous sections of assessment  Patient denies changes in ADL function or IADLs  at this time. She has started using a walking pad with some effect . Recc she consider therapeutic massages   Patient mood, expressions, emotional tone, insight, judgement  and thought content as expected with no notable or reported change. She continues to see a Psychiatrist and  visits with Talk therapy She [place] followed by VBCI SW as well lsst visit 3/3  No reports of new onset or increased / uncontrolled pain, has chronic pain , she feels it is not well managed with medicinal or non-medicinal interventions. She reports she does not feel she has the right pain management pan  No changes in skin, no rashes, wounds or open areas  Diet reviewed and  education provided as needed to promote  healthy diet  Therapy Services NA    Medication reconciliation/ review completed based on most recent medication list in EHR; and in review of recent Provider follow-up appointments- confirmed patient obtained / is taking all newly prescribed medications as instructed and is aware of any changes to previous medications regimen including dosage adjustments.  Reports  no new changes in medication/treatment  regimen  during this assessment period.   Medications and current treatments are effective for use intended. No adverse Drug reactions  reported No changes or modifications indicated at this time.   Patient self- manages  medications Patient is able to take medications without difficulty;  denies questions/ concerns and reports no barriers to medication adherence at this time   Patient  educated on red flag s/s to watch for based on current discharge diagnosis and was encouraged to report, any changes in baseline or  medication regimen,   or any new unmanaged side effects or  symptoms not relieved with interventions  to PCP and / or the  VBCI Case Management team .          SDOH Interventions    Flowsheet Row Patient Outreach from 06/15/2023 in [place] POPULATION HEALTH DEPARTMENT Care Coordination from 06/14/2023 in [facility] Community Care Coordination Telephone from 06/07/2023 in [facility] [facility] POPULATION HEALTH DEPARTMENT Telephone from 11/16/2022 in [facility] Community Care Coordination Care Coordination from 01/02/2022 in [facility] Care Coordination Office Visit from 05/15/2021 in [facility] at [facility]  SDOH Interventions        Food Insecurity Interventions -- Intervention Not Indicated Intervention Not Indicated Intervention Not Indicated Intervention Not Indicated --  Housing Interventions -- Intervention Not Indicated Intervention Not Indicated Intervention Not Indicated Intervention Not Indicated --  Transportation Interventions -- Intervention Not Indicated, Patient Resources (Friends/Family) Intervention Not Indicated, Patient Resources (Friends/Family) Intervention Not Indicated, Patient Resources Dietitian) Intervention Not Indicated --  Utilities Interventions Intervention Not Indicated -- Intervention Not Indicated -- Intervention Not Indicated --  Depression Interventions/Treatment  -- -- -- -- -- Currently on Treatment  Social Connections Interventions -- -- Other (Comment) -- -- --        Goals Addressed             This Visit's Progress    TOC Care Plan       Current Barriers:  Provider appointments surgical and Bariatric follow-up Equipment/DME walker, recliner, mobility assistance  Functional/Safety risk falls SDOH (other) social isolation , lonely, needs support at home due to physical limitations   RNCM Clinical Goal(s):  Patient will work with the Care Management team over the next 30 days to address Transition of Care Barriers: Medication Management Support at home Provider appointments take all medications exactly as prescribed and will call provider for medication related questions as evidenced by no missed medication doses  attend all scheduled medical appointments: with surgeon and Bariatric follow-up as evidenced by no missed follow-up work with Child psychotherapist to address  related to the management of Limited social support,  Transportation, and ADL IADL limitations management of household tasks and social activity  related to the recent surgery and physical restrictions  as evidenced by review of EMR and patient or Child psychotherapist report through collaboration with Medical illustrator, provider, and care team.   Interventions: Evaluation of current treatment plan related to  self management and patient's adherence to plan as established by provider  Transitions of Care:  Goal on track:  Yes. Community Resource Referral Made to address Social Connections Physical Activity Doctor Visits  - discussed the importance of doctor visits Post discharge activity limitations prescribed by provider reviewed Post-op wound/incision care reviewed with patient/caregiver Reviewed Signs and symptoms of infection  Patient Goals/Self-Care Activities: Participate in Transition of Care Program/Attend TOC scheduled calls Take all medications as prescribed Attend all scheduled provider appointments Call pharmacy for medication refills 3-7 days in advance of running out of medications Attend church or other social activities Perform all self care activities independently  Perform IADL's (shopping, preparing meals, housekeeping, managing finances) independently Call provider office for new concerns or questions   Follow Up Plan:  Telephone follow up appointment with care management team member scheduled for:  07/08/23 @ 11:30am The patient has been provided with contact information for the care management team and has been advised to call with any health related questions or concerns.  Plan:  She will follow p next with with Fibromyalgia and pain clinic She will follow-up with stimulator rep after she has the other issues under control  She will work on getting a new PCP provided MD Provider line via my chart  The patient has been provided with contact information for the care management team and has been advised to call with  any health related questions or concerns.   Routine follow-up and on-going assessment evaluation and education of disease processes, recommended interventions for both chronic and acute medical conditions , will occur during each weekly visit along with ongoing review of symptoms ,medication reviews and reconciliation. Any updates , inconsistencies, discrepancies or acute care concerns will be addressed and routed to the correct Practitioner if indicated   Based on current information and Insurance plan -Reviewed benefits available to patient, including details about eligibility options for care if any area of needs were identified.  Reviewed patients ability to access and / or navigating the benefits system..Amb Referral made if indicted , refer to orders section of note for details   Please refer to Care Plan for goals and interventions -Effectiveness of interventions, symptom management and outcomes will be evaluated  weekly during [facility] 30-day Program Outreach calls  . Any necessary  changes and updates to Care Plan will be completed episodically    Reviewed goals for care Patient verbalizes understanding of instructions and care plan provided. Patient was encouraged to make informed decisions about their care, actively participate in managing their health condition, and implement lifestyle changes as needed to promote independence and self-management of health care   Patient was encouraged to Contact PCP with any changes in baseline or  medication regimen,  changes in health status  /  well-being, safety concerns, including falls any questions or concerns regarding ongoing medical care, any difficulty obtaining or picking up prescriptions, any changes or worsening in condition- including  symptoms not relieved  with interventions    The patient has been provided with contact information for the care management team and has been advised to call with any health-related questions or concerns. Follow up  call  with Care Team  as scheduled,or sooner should any new problems arise.   Susa Loffler , BSN, RN [facility]   VBCI-Population Health RN Care Manager Direct Dial [phone]  Fax: [phone] Website: [email or web address]

## 2023-07-05 ENCOUNTER — Telehealth: Payer: Self-pay | Admitting: Orthopedic Surgery

## 2023-07-05 DIAGNOSIS — F411 Generalized anxiety disorder: Secondary | ICD-10-CM | POA: Diagnosis not present

## 2023-07-05 DIAGNOSIS — Z888 Allergy status to other drugs, medicaments and biological substances status: Secondary | ICD-10-CM | POA: Diagnosis not present

## 2023-07-05 DIAGNOSIS — Z9689 Presence of other specified functional implants: Secondary | ICD-10-CM

## 2023-07-05 DIAGNOSIS — F32A Depression, unspecified: Secondary | ICD-10-CM | POA: Diagnosis not present

## 2023-07-05 DIAGNOSIS — Z8711 Personal history of peptic ulcer disease: Secondary | ICD-10-CM | POA: Diagnosis not present

## 2023-07-05 DIAGNOSIS — G4733 Obstructive sleep apnea (adult) (pediatric): Secondary | ICD-10-CM | POA: Diagnosis not present

## 2023-07-05 DIAGNOSIS — J45909 Unspecified asthma, uncomplicated: Secondary | ICD-10-CM | POA: Diagnosis not present

## 2023-07-05 DIAGNOSIS — Z7901 Long term (current) use of anticoagulants: Secondary | ICD-10-CM | POA: Diagnosis not present

## 2023-07-05 DIAGNOSIS — R1031 Right lower quadrant pain: Secondary | ICD-10-CM | POA: Diagnosis not present

## 2023-07-05 DIAGNOSIS — Z90710 Acquired absence of both cervix and uterus: Secondary | ICD-10-CM | POA: Diagnosis not present

## 2023-07-05 DIAGNOSIS — K561 Intussusception: Secondary | ICD-10-CM | POA: Diagnosis not present

## 2023-07-05 DIAGNOSIS — E860 Dehydration: Secondary | ICD-10-CM | POA: Diagnosis not present

## 2023-07-05 DIAGNOSIS — Z87891 Personal history of nicotine dependence: Secondary | ICD-10-CM | POA: Diagnosis not present

## 2023-07-05 DIAGNOSIS — K625 Hemorrhage of anus and rectum: Secondary | ICD-10-CM | POA: Diagnosis not present

## 2023-07-05 DIAGNOSIS — M797 Fibromyalgia: Secondary | ICD-10-CM | POA: Diagnosis not present

## 2023-07-05 DIAGNOSIS — K219 Gastro-esophageal reflux disease without esophagitis: Secondary | ICD-10-CM | POA: Diagnosis not present

## 2023-07-05 DIAGNOSIS — Z9884 Bariatric surgery status: Secondary | ICD-10-CM | POA: Diagnosis not present

## 2023-07-05 DIAGNOSIS — M961 Postlaminectomy syndrome, not elsewhere classified: Secondary | ICD-10-CM

## 2023-07-05 DIAGNOSIS — Z8616 Personal history of COVID-19: Secondary | ICD-10-CM | POA: Diagnosis not present

## 2023-07-05 DIAGNOSIS — Z79899 Other long term (current) drug therapy: Secondary | ICD-10-CM | POA: Diagnosis not present

## 2023-07-05 DIAGNOSIS — G894 Chronic pain syndrome: Secondary | ICD-10-CM

## 2023-07-05 DIAGNOSIS — D649 Anemia, unspecified: Secondary | ICD-10-CM | POA: Diagnosis not present

## 2023-07-05 DIAGNOSIS — Z981 Arthrodesis status: Secondary | ICD-10-CM

## 2023-07-05 DIAGNOSIS — R1110 Vomiting, unspecified: Secondary | ICD-10-CM | POA: Diagnosis not present

## 2023-07-05 MED ORDER — OXYCODONE HCL 5 MG PO TABS: 5.0000 mg | ORAL_TABLET | Freq: Three times a day (TID) | ORAL | 0 refills | Status: DC | PRN

## 2023-07-05 NOTE — Telephone Encounter (Signed)
 Okay for refill of oxycodone. PMP reviewed and is appropriate. Directions changed to q 8 hours.  Please let her know I sent oxycodone to pharmacy.   Did she hear anything back from [facility]? I did a referral on 2/25.   Please let me know.

## 2023-07-06 ENCOUNTER — Ambulatory Visit: Admitting: Student in an Organized Health Care Education/Training Program

## 2023-07-06 ENCOUNTER — Telehealth: Payer: Self-pay

## 2023-07-06 ENCOUNTER — Encounter: Payer: Self-pay | Admitting: *Deleted

## 2023-07-06 DIAGNOSIS — R1031 Right lower quadrant pain: Secondary | ICD-10-CM | POA: Diagnosis not present

## 2023-07-06 DIAGNOSIS — Z9889 Other specified postprocedural states: Secondary | ICD-10-CM | POA: Diagnosis not present

## 2023-07-06 DIAGNOSIS — Z9884 Bariatric surgery status: Secondary | ICD-10-CM | POA: Diagnosis not present

## 2023-07-06 NOTE — Telephone Encounter (Signed)
 She had to cancel her appt today because she is in the hospital in [place]. She did not tell me why but she wanted you to know, that is why she cancelled.

## 2023-07-08 ENCOUNTER — Other Ambulatory Visit: Payer: Self-pay

## 2023-07-08 NOTE — Patient Outreach (Signed)
 Care Management  Transitions of Care Program Transitions of Care Post-discharge Week 4  07/08/2023 Name: [name] MRN: [id number] DOB: [birth year]  Subjective: Margaret Cuevas is a 48 y.o. year old female who is a primary care patient of [name], MD. The Care Management team was unable to reach the patient by phone to assess and address transitions of care needs.    Plan: Per chart review the patient has readmitted to the hospital  No further outreach attempts will be made at this time.    Current Admission  Hospital :[facility]  Admission Date 07/05/23   The VBCI CM team can be reached by calling [phone] and  is available to follow up with the patient for care management engagement after discharge from the hospital.        Susa Loffler , BSN, RN [facility]   VBCI-Population Health RN Care Manager Direct Dial [phone]  Fax: [phone] Website: [email or web address]

## 2023-07-13 DIAGNOSIS — M160 Bilateral primary osteoarthritis of hip: Secondary | ICD-10-CM | POA: Diagnosis not present

## 2023-07-13 DIAGNOSIS — M1611 Unilateral primary osteoarthritis, right hip: Secondary | ICD-10-CM | POA: Diagnosis not present

## 2023-07-19 ENCOUNTER — Other Ambulatory Visit: Payer: Self-pay | Admitting: Orthopedic Surgery

## 2023-07-19 DIAGNOSIS — Z9689 Presence of other specified functional implants: Secondary | ICD-10-CM

## 2023-07-19 DIAGNOSIS — M961 Postlaminectomy syndrome, not elsewhere classified: Secondary | ICD-10-CM

## 2023-07-19 DIAGNOSIS — G894 Chronic pain syndrome: Secondary | ICD-10-CM

## 2023-07-19 DIAGNOSIS — Z981 Arthrodesis status: Secondary | ICD-10-CM

## 2023-07-19 MED ORDER — OXYCODONE HCL 5 MG PO TABS: 5.0000 mg | ORAL_TABLET | Freq: Three times a day (TID) | ORAL | 0 refills | Status: DC | PRN

## 2023-07-19 NOTE — Telephone Encounter (Signed)
 Refill for oxycodone okay. PMP reviewed and is appropriate.     Dr. Cherylann Ratel generally does not do medication management of pain. I had sent a referral to [facility] for medication  management. Has she heard anything from them?   Please let her know about refill and ask about referral.

## 2023-07-20 ENCOUNTER — Encounter: Payer: Self-pay | Admitting: *Deleted

## 2023-07-20 DIAGNOSIS — Z9884 Bariatric surgery status: Secondary | ICD-10-CM | POA: Diagnosis not present

## 2023-07-20 DIAGNOSIS — R1084 Generalized abdominal pain: Secondary | ICD-10-CM | POA: Diagnosis not present

## 2023-07-21 ENCOUNTER — Encounter: Payer: Self-pay | Admitting: Neurosurgery

## 2023-07-26 DIAGNOSIS — G4733 Obstructive sleep apnea (adult) (pediatric): Secondary | ICD-10-CM | POA: Diagnosis not present

## 2023-07-27 ENCOUNTER — Ambulatory Visit (INDEPENDENT_AMBULATORY_CARE_PROVIDER_SITE_OTHER): Payer: Federal, State, Local not specified - PPO | Admitting: Licensed Clinical Social Worker

## 2023-07-27 ENCOUNTER — Encounter: Payer: Self-pay | Admitting: Student in an Organized Health Care Education/Training Program

## 2023-07-27 ENCOUNTER — Telehealth: Payer: Self-pay | Admitting: Psychiatry

## 2023-07-27 ENCOUNTER — Ambulatory Visit
Attending: Student in an Organized Health Care Education/Training Program | Admitting: Student in an Organized Health Care Education/Training Program

## 2023-07-27 ENCOUNTER — Other Ambulatory Visit: Payer: Self-pay | Admitting: Psychiatry

## 2023-07-27 VITALS — BP 115/85 | HR 80 | Temp 97.3°F | Resp 16 | Ht 65.0 in | Wt 223.0 lb

## 2023-07-27 DIAGNOSIS — M533 Sacrococcygeal disorders, not elsewhere classified: Secondary | ICD-10-CM | POA: Insufficient documentation

## 2023-07-27 DIAGNOSIS — G5703 Lesion of sciatic nerve, bilateral lower limbs: Secondary | ICD-10-CM | POA: Diagnosis not present

## 2023-07-27 DIAGNOSIS — F331 Major depressive disorder, recurrent, moderate: Secondary | ICD-10-CM

## 2023-07-27 DIAGNOSIS — M461 Sacroiliitis, not elsewhere classified: Secondary | ICD-10-CM | POA: Diagnosis not present

## 2023-07-27 DIAGNOSIS — F419 Anxiety disorder, unspecified: Secondary | ICD-10-CM | POA: Diagnosis not present

## 2023-07-27 MED ORDER — DULOXETINE HCL 60 MG PO CPEP
60.0000 mg | ORAL_CAPSULE | ORAL | 0 refills | Status: DC
Start: 2023-07-27 — End: 2023-11-11

## 2023-07-27 NOTE — Telephone Encounter (Signed)
 called pharmacy he stated that it was a rx on hold for the 30mg  and that he would go ahead and get that one ready also

## 2023-07-27 NOTE — Telephone Encounter (Signed)
 called to notify pt that the duloxetine 60mg  was sent into the pharmacy. pt states that she needed the 30mg . pt was told that rx was sent in feb for 90 day supply and that should be enough until may. she stated she did not have any. told her that i would call the pharmacy and then call her back

## 2023-07-27 NOTE — Patient Instructions (Addendum)
 Schedule for 4/9 @ 3pm   ______________________________________________________________________    Procedure instructions  Stop blood-thinners  Do not eat or drink fluids (other than water) for 6 hours before your procedure  No water for 2 hours before your procedure  Take your blood pressure medicine with a sip of water  Arrive 30 minutes before your appointment  If sedation is planned, bring suitable driver. Pennie Banter, Benedetto Goad, & public transportation are NOT APPROVED)  Carefully read the "Preparing for your procedure" detailed instructions  If you have questions call us at [phone]  Procedure appointments are for procedures only. NO medication refills or new problem evaluations.   ______________________________________________________________________

## 2023-07-27 NOTE — Telephone Encounter (Signed)
 pt notified that pharmacy had a refill left on the 30mg  and that the pharmaist states he will get that rx ready

## 2023-07-27 NOTE — Telephone Encounter (Signed)
 Duloxetine 60 mg is sent to the pharmacy.
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
## 2023-07-27 NOTE — Progress Notes (Signed)
 PROVIDER NOTE: Information contained herein reflects review and annotations entered in association with encounter. Interpretation of such information and data should be left to medically-trained personnel. Information provided to patient can be located elsewhere in the medical record under "Patient Instructions". Document created using STT-dictation technology, any transcriptional errors that may result from process are unintentional.    Patient: Margaret Cuevas  Service Category: E/M  Provider: Edward Jolly, MD  DOB: 11-04-1975  DOS: 07/27/2023  Referring Provider: [name], MD  MRN: [id number]  Specialty: Interventional Pain Management  PCP: [name], MD  Type: Established Patient  Setting: Ambulatory outpatient    Location: Office  Delivery: Face-to-face     HPI  Margaret Cuevas, a 48 y.o. 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 Margaret Cuevas primary complain today is Back Pain (Lower back )  Pain Assessment: Severity of Chronic pain is reported as a 7 /10. Location: Back Lower, Medial/Radaites from lower back into hips, buttocks, groin and thighs bilateral. Onset: More than a month ago. Quality: Constant, Stabbing, Cramping, Aching, Sharp, Shooting, Spasm. Timing: Constant. Modifying factor(s): Hip injections. Vitals:  height is 5\' 5"  (1.651 m) and weight is 223 lb (101.2 kg). Her temporal temperature is 97.3 F (36.3 C) (abnormal). Her blood pressure is 115/85 and her pulse is 80. Her respiration is 16 and oxygen saturation is 97%.  BMI: Estimated body mass index is 37.11 kg/m as calculated from the following:   Height as of this encounter: 5\' 5"  (1.651 m).   Weight as of this encounter: 223 lb (101.2 kg). Last encounter: 02/11/2023. Last procedure: 03/24/2023.  Reason for encounter:   History of Present Illness   Margaret Cuevas is a 48 year old female with SI joint dysfunction and hip arthritis who presents with persistent pain despite  spinal cord stimulation.  She has been experiencing persistent pain despite the use of a spinal cord stimulator, which has been effective for lower extremity nerve pain but not for the pain associated with her SI joint. The SI joint pain is distinct and possibly related to arthritis, identified in March 2025 during a visit to an orthopedic specialist. This was the first evaluation of her SI joints since November 2023.  The pain associated with her SI joint is described as sharp and explosive, likened to 'an ax or an ice pick', and can feel like a 'complete explosion' at times. She inquires if this sensation could be related to the piriformis muscle, which is often involved in SI joint dysfunction.  She has a history of arthritis in both hips and has received two injections in her right hip for this condition. However, she has not yet received any injections for her SI joints. Recent x-rays were performed during her last visit with Dr. Floyce Stakes which suggested SI joint dysfunction, but the orthopedic specialist does not perform SI joint injections.        ROS  Constitutional: Denies any fever or chills Gastrointestinal: No reported hemesis, hematochezia, vomiting, or acute GI distress Musculoskeletal:  SI joint, buttock pain, piriformis pain Neurological: No reported episodes of acute onset apraxia, aphasia, dysarthria, agnosia, amnesia, paralysis, loss of coordination, or loss of consciousness  Medication Review  DULoxetine, Multi-Vitamin, Multiple Vitamins-Minerals, acetaminophen, busPIRone, cyclobenzaprine, docusate sodium, enoxaparin, estradiol, gabapentin, omeprazole, oxyCODONE, polyethylene glycol, senna, sucralfate, and triamcinolone  History Review  Allergy: Margaret Cuevas is allergic to ibuprofen. Drug: Margaret Cuevas  reports no history of drug use. Alcohol:  reports  current alcohol use. Tobacco:  reports that she has quit smoking. Her smoking use included cigarettes. She has been exposed to  tobacco smoke. She has never used smokeless tobacco. Social: Margaret Cuevas  reports that she has quit smoking. Her smoking use included cigarettes. She has been exposed to tobacco smoke. She has never used smokeless tobacco. She reports current alcohol use. She reports that she does not use drugs. Medical:  has a past medical history of Anemia, Anginal pain (HCC), Anxiety, Arthritis, Asthma, Chest pain, Chronic pain syndrome, Complication of anesthesia, Failed back surgical syndrome, Fibromyalgia, GERD (gastroesophageal reflux disease), Hiatal hernia, History of Roux-en-Y gastric bypass (2005), Lipoma of back, Lumbar disc herniation, Lumbar radiculopathy, Obesity, OSA on CPAP, and Pneumonia. Surgical: Margaret Cuevas  has a past surgical history that includes Abdominal hysterectomy; Gastric bypass (2006); ORIF finger / thumb fracture; Back surgery; Ankle fracture surgery; Cholecystectomy; Ear tube removal; Cesarean section; Appendectomy; Colonoscopy (N/A, 05/04/2022); Esophagogastroduodenoscopy (N/A, 05/04/2022); Right RIGHT L4-5 FAR LATERAL DISCECTOMY (08/07/2022); Anterior lateral lumbar fusion with percutaneous screw 1 level (N/A, 11/09/2022); Application of intraoperative CT scan (N/A, 11/09/2022); and Thoracic laminectomy for spinal cord stimulator (N/A, 04/16/2023). Family: family history includes Breast cancer (age of onset: 28) in her cousin; Cancer in her maternal grandmother and paternal grandfather; Diabetes in her maternal grandmother and mother; Drug abuse in her sister; Hypertension in her father, maternal grandmother, and paternal grandmother.  Laboratory Chemistry Profile   Renal Lab Results  Component Value Date   BUN 18 04/06/2023   CREATININE 0.75 04/06/2023   BCR 27 (H) 11/14/2021   GFRAA >60 05/22/2017   GFRNONAA >60 04/06/2023    Hepatic Lab Results  Component Value Date   AST 19 06/27/2020   ALT 15 06/27/2020   ALBUMIN 4.1 11/14/2021   ALKPHOS 55 06/27/2020   LIPASE 24 06/27/2020     Electrolytes Lab Results  Component Value Date   NA 138 04/06/2023   K 4.5 04/06/2023   CL 103 04/06/2023   CALCIUM 9.1 04/06/2023   PHOS 4.6 (H) 11/14/2021    Bone No results found for: "VD25OH", "VD125OH2TOT", "JX9147WG9", "FA2130QM5", "25OHVITD1", "25OHVITD2", "25OHVITD3", "TESTOFREE", "TESTOSTERONE"  Inflammation (CRP: Acute Phase) (ESR: Chronic Phase) Lab Results  Component Value Date   ESRSEDRATE 5 07/03/2016         Note: Above Lab results reviewed.  Recent Imaging Review  DG Thoracic Spine 2 View CLINICAL DATA:  [id number] Surgery, elective [id number]  EXAM: THORACIC SPINE 2 VIEWS  COMPARISON:  None Available.  FINDINGS: Two frontal fluoroscopic patch that 2 fluoroscopic spot views of the thoracic spine obtained in the operating room. Initial image demonstrates instruments localizing over the lower aspect of T8 and at the T10 level. Subsequent placement of spinal stimulator with tips at the T8-T9 level. Fluoroscopy time 7 seconds. Dose 2.4495 mGy.  IMPRESSION: Intraoperative fluoroscopy during spinal stimulator placement.  Electronically Signed   By: Narda Rutherford M.D.   On: 04/16/2023 17:29 DG C-Arm 1-60 Min-No Report Fluoroscopy was utilized by the requesting physician.  No radiographic  interpretation.  DG C-Arm 1-60 Min-No Report Fluoroscopy was utilized by the requesting physician.  No radiographic  interpretation.  Note: Reviewed        Physical Exam  General appearance: Well nourished, well developed, and well hydrated. In no apparent acute distress Mental status: Alert, oriented x 3 (person, place, & time)       Respiratory: No evidence of acute respiratory distress Eyes: PERLA Vitals: BP 115/85 (Patient Position: Sitting, Cuff Size:  Normal)   Pulse 80   Temp (!) 97.3 F (36.3 C) (Temporal)   Resp 16   Ht 5\' 5"  (1.651 m)   Wt 223 lb (101.2 kg)   SpO2 97%   BMI 37.11 kg/m  BMI: Estimated body mass index is 37.11 kg/m as calculated from the  following:   Height as of this encounter: 5\' 5"  (1.651 m).   Weight as of this encounter: 223 lb (101.2 kg). Ideal: Ideal body weight: 57 kg (125 lb 10.6 oz) Adjusted ideal body weight: 74.7 kg (164 lb 9.6 oz)  Lumbar Spine Area Exam  Skin & Axial Inspection: Well healed scar from previous spine surgery detected Alignment: Symmetrical Functional ROM: Pain restricted ROM       Stability: No instability detected Muscle Tone/Strength: Functionally intact. No obvious neuro-muscular anomalies detected. Sensory (Neurological): Unimpaired Palpation: No palpable anomalies       Provocative Tests:  Patrick's Maneuver: (+) for bilateral S-I arthralgia             FABER* test:(+) for bilateral S-I arthralgia             S-I anterior distraction/compression test: (+) for bilateral S-I arthralgia             S-I lateral compression test: (+) for bilateral S-I arthralgia             S-I Thigh-thrust test:(+) for bilateral S-I arthralgia             S-I Gaenslen's test: (+) for bilateral S-I arthralgia             *(Flexion, ABduction and External Rotation) Gait & Posture Assessment  Ambulation: Unassisted Gait: Relatively normal for age and body habitus Posture: WNL  Lower Extremity Exam    Side: Right lower extremity  Side: Left lower extremity  Stability: No instability observed          Stability: No instability observed          Skin & Extremity Inspection: Skin color, temperature, and hair growth are WNL. No peripheral edema or cyanosis. No masses, redness, swelling, asymmetry, or associated skin lesions. No contractures.  Skin & Extremity Inspection: Skin color, temperature, and hair growth are WNL. No peripheral edema or cyanosis. No masses, redness, swelling, asymmetry, or associated skin lesions. No contractures.  Functional ROM: Unrestricted ROM                  Functional ROM: Unrestricted ROM                  Muscle Tone/Strength: Functionally intact. No obvious neuro-muscular  anomalies detected.  Muscle Tone/Strength: Functionally intact. No obvious neuro-muscular anomalies detected.  Sensory (Neurological): Unimpaired        Sensory (Neurological): Unimpaired        DTR: Patellar: deferred today Achilles: deferred today Plantar: deferred today  DTR: Patellar: deferred today Achilles: deferred today Plantar: deferred today  Palpation: No palpable anomalies  Palpation: No palpable anomalies    Assessment   Diagnosis Status  1. SI joint arthritis (HCC)   2. Sacroiliac joint pain   3. Piriformis syndrome of both sides    Controlled Controlled Controlled   Updated Problems: Problem  Si Joint Arthritis (Hcc)  Sacroiliac Joint Pain  Piriformis Syndrome of Both Sides    Plan of Care  Problem-specific:  Assessment and Plan    Sacroiliac (SI) joint dysfunction   She experiences sharp, explosive pain in the SI joint, likely due to  arthritis and possibly involving the piriformis muscle. The spinal cord stimulator has been ineffective for this pain.Schedule SI joint and piriformis muscle injections, possibly for next Wednesday. Administer sedation with oral Valium if desired. Ensure she has a driver and fasts for 6-8 hours before the procedure.  Conservative Management: Activity modification, avoid prolonged sitting or standing Has done physical therapy focused on pelvic stabilization and core strengthening APAP  inflammation and pain control  Interventional Plan: SI Joint Injection: Procedure: Image-guided SI joint steroid injection (typically fluoroscopic) Indication: Diagnostic and therapeutic relief for SI joint inflammation/dysfunction Piriformis Injection: Procedure: Fluoro-guided piriformis muscle injection Indication: Persistent piriformis syndrome symptoms after conservative treatment   Hip arthritis   She has arthritis in both hips and has received two injections in the right hip. The orthopedic team is managing her hip arthritis.        Margaret Cuevas has a current medication list which includes the following long-term medication(s): duloxetine, duloxetine, estradiol, gabapentin, omeprazole, sucralfate, and triamcinolone.  Pharmacotherapy (Medications Ordered): No orders of the defined types were placed in this encounter.  Orders:  Orders Placed This Encounter  Procedures   SACROILIAC JOINT INJECTION    Standing Status:   Future    Expected Date:   08/04/2023    Expiration Date:   10/26/2023    Scheduling Instructions:     Side: Bilateral     Sedation: PO Valim     Timeframe: ASAP    Where will this procedure be performed?:   [facility]   TRIGGER POINT INJECTION    Area: Buttocks region (gluteal area) Indications: Piriformis muscle pain; Bilateral (G57.03) piriformis-syndrome; piriformis muscle spasms (M62.838). CPT code: 19147    Scheduling Instructions:     Piriformis TPI    Where will this procedure be performed?:   [facility]   Follow-up plan:   Return in about 8 days (around 08/04/2023) for B/L SIJ + Piriformis , in clinic (PO Valium 5mg ).     Recent Visits No visits were found meeting these conditions. Showing recent visits within past 90 days and meeting all other requirements Today's Visits Date Type Provider Dept  07/27/23 Office Visit Edward Jolly, MD [facility]  Showing today's visits and meeting all other requirements Future Appointments Date Type Provider Dept  08/04/23 Appointment Edward Jolly, MD [facility]  Showing future appointments within next 90 days and meeting all other requirements  I discussed the assessment and treatment plan with the patient. The patient was provided an opportunity to ask questions and all were answered. The patient agreed with the plan and demonstrated an understanding of the instructions.  Patient advised to call back or seek an in-person evaluation if the symptoms or condition worsens.  Duration of encounter:  .  Total time on encounter, as per AMA guidelines included both the face-to-face and non-face-to-face time personally spent by the physician and/or other qualified health care professional(s) on the day of the encounter (includes time in activities that require the physician or other qualified health care professional and does not include time in activities normally performed by clinical staff). Physician's time may include the following activities when performed: Preparing to see the patient (e.g., pre-charting review of records, searching for previously ordered imaging, lab work, and nerve conduction tests) Review of prior analgesic pharmacotherapies. Reviewing PMP Interpreting ordered tests (e.g., lab work, imaging, nerve conduction tests) Performing post-procedure evaluations, including interpretation of diagnostic procedures Obtaining and/or reviewing separately obtained history Performing a medically appropriate examination  and/or evaluation Counseling and educating the patient/family/caregiver Ordering medications, tests, or procedures Referring and communicating with other health care professionals (when not separately reported) Documenting clinical information in the electronic or other health record Independently interpreting results (not separately reported) and communicating results to the patient/ family/caregiver Care coordination (not separately reported)  Note by: Edward Jolly, MD Date: 07/27/2023; Time: 10:12 AM

## 2023-07-27 NOTE — Progress Notes (Signed)
 Safety precautions to be maintained throughout the outpatient stay will include: orient to surroundings, keep bed in low position, maintain call bell within reach at all times, provide assistance with transfer out of bed and ambulation.

## 2023-07-29 ENCOUNTER — Ambulatory Visit
Payer: Federal, State, Local not specified - PPO | Admitting: Student in an Organized Health Care Education/Training Program

## 2023-07-29 ENCOUNTER — Ambulatory Visit: Admitting: Student in an Organized Health Care Education/Training Program
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
## 2023-08-02 ENCOUNTER — Telehealth: Payer: Self-pay | Admitting: Orthopedic Surgery

## 2023-08-02 DIAGNOSIS — M961 Postlaminectomy syndrome, not elsewhere classified: Secondary | ICD-10-CM

## 2023-08-02 DIAGNOSIS — Z981 Arthrodesis status: Secondary | ICD-10-CM

## 2023-08-02 DIAGNOSIS — Z9689 Presence of other specified functional implants: Secondary | ICD-10-CM

## 2023-08-02 DIAGNOSIS — G894 Chronic pain syndrome: Secondary | ICD-10-CM

## 2023-08-02 MED ORDER — OXYCODONE HCL 5 MG PO TABS: 5.0000 mg | ORAL_TABLET | Freq: Three times a day (TID) | ORAL | 0 refills | Status: DC | PRN

## 2023-08-02 NOTE — Telephone Encounter (Signed)
 Okay for refill of oxycodone. PMP reviewed and is appropriate.   Please let her know I sent oxycodone to pharmacy.    Also, did she hear anything back from [facility]? I did a referral on 2/25.   She would continue to see [facility] for injections and would see them for long term medication management of pain.

## 2023-08-04 ENCOUNTER — Encounter: Payer: Self-pay | Admitting: Psychiatry

## 2023-08-04 ENCOUNTER — Telehealth: Payer: Federal, State, Local not specified - PPO | Admitting: Psychiatry

## 2023-08-04 ENCOUNTER — Ambulatory Visit
Admission: RE | Admit: 2023-08-04 | Discharge: 2023-08-04 | Disposition: A | Discharge status: Home / Self Care | Source: Ambulatory Visit | Attending: Student in an Organized Health Care Education/Training Program | Admitting: Student in an Organized Health Care Education/Training Program

## 2023-08-04 ENCOUNTER — Encounter: Payer: Self-pay | Admitting: Student in an Organized Health Care Education/Training Program

## 2023-08-04 ENCOUNTER — Ambulatory Visit
Attending: Student in an Organized Health Care Education/Training Program | Admitting: Student in an Organized Health Care Education/Training Program

## 2023-08-04 VITALS — BP 125/86 | HR 79 | Temp 97.8°F | Resp 25 | Ht 65.0 in | Wt 223.0 lb

## 2023-08-04 DIAGNOSIS — M25551 Pain in right hip: Secondary | ICD-10-CM | POA: Insufficient documentation

## 2023-08-04 DIAGNOSIS — F330 Major depressive disorder, recurrent, mild: Secondary | ICD-10-CM

## 2023-08-04 DIAGNOSIS — G5700 Lesion of sciatic nerve, unspecified lower limb: Secondary | ICD-10-CM | POA: Diagnosis not present

## 2023-08-04 DIAGNOSIS — M533 Sacrococcygeal disorders, not elsewhere classified: Secondary | ICD-10-CM | POA: Diagnosis not present

## 2023-08-04 DIAGNOSIS — G5703 Lesion of sciatic nerve, bilateral lower limbs: Secondary | ICD-10-CM | POA: Diagnosis not present

## 2023-08-04 DIAGNOSIS — Z981 Arthrodesis status: Secondary | ICD-10-CM | POA: Insufficient documentation

## 2023-08-04 DIAGNOSIS — M791 Myalgia, unspecified site: Secondary | ICD-10-CM | POA: Diagnosis not present

## 2023-08-04 DIAGNOSIS — F419 Anxiety disorder, unspecified: Secondary | ICD-10-CM

## 2023-08-04 DIAGNOSIS — M545 Low back pain, unspecified: Secondary | ICD-10-CM | POA: Insufficient documentation

## 2023-08-04 DIAGNOSIS — M461 Sacroiliitis, not elsewhere classified: Secondary | ICD-10-CM | POA: Diagnosis not present

## 2023-08-04 DIAGNOSIS — Z9884 Bariatric surgery status: Secondary | ICD-10-CM | POA: Diagnosis not present

## 2023-08-04 MED ORDER — DEXAMETHASONE SODIUM PHOSPHATE 10 MG/ML IJ SOLN
10.0000 mg | Freq: Once | INTRAMUSCULAR | Status: AC
  Administered 2023-08-04: 10 mg
  Filled 2023-08-04: qty 1

## 2023-08-04 MED ORDER — DIAZEPAM 5 MG PO TABS
ORAL_TABLET | ORAL | Status: AC
  Filled 2023-08-04: qty 1

## 2023-08-04 MED ORDER — METHYLPREDNISOLONE ACETATE 80 MG/ML IJ SUSP
80.0000 mg | Freq: Once | INTRAMUSCULAR | Status: AC
  Administered 2023-08-04: 80 mg via INTRA_ARTICULAR
  Filled 2023-08-04: qty 1

## 2023-08-04 MED ORDER — ROPIVACAINE HCL 2 MG/ML IJ SOLN
9.0000 mL | Freq: Once | INTRAMUSCULAR | Status: AC
  Administered 2023-08-04: 9 mL via PERINEURAL
  Filled 2023-08-04: qty 20

## 2023-08-04 MED ORDER — IOHEXOL 180 MG/ML  SOLN
10.0000 mL | Freq: Once | INTRAMUSCULAR | Status: AC
  Administered 2023-08-04: 10 mL via INTRA_ARTICULAR
  Filled 2023-08-04: qty 20

## 2023-08-04 MED ORDER — DIAZEPAM 5 MG PO TABS
5.0000 mg | ORAL_TABLET | ORAL | Status: AC
  Administered 2023-08-04: 5 mg via ORAL

## 2023-08-04 MED ORDER — ROPIVACAINE HCL 2 MG/ML IJ SOLN
9.0000 mL | Freq: Once | INTRAMUSCULAR | Status: AC
  Administered 2023-08-04: 9 mL via INTRA_ARTICULAR
  Filled 2023-08-04: qty 20

## 2023-08-04 MED ORDER — LIDOCAINE HCL 2 % IJ SOLN
20.0000 mL | Freq: Once | INTRAMUSCULAR | Status: AC
  Administered 2023-08-04: 200 mg
  Filled 2023-08-04: qty 20

## 2023-08-04 NOTE — Progress Notes (Signed)
 Safety precautions to be maintained throughout the outpatient stay will include: orient to surroundings, keep bed in low position, maintain call bell within reach at all times, provide assistance with transfer out of bed and ambulation.

## 2023-08-04 NOTE — Patient Instructions (Signed)

## 2023-08-04 NOTE — Progress Notes (Signed)
 PROVIDER NOTE: Interpretation of information contained herein should be left to medically-trained personnel. Specific patient instructions are provided elsewhere under "Patient Instructions" section of medical record. This document was created in part using STT-dictation technology, any transcriptional errors that may result from this process are unintentional.  Patient: Margaret Cuevas Type: Established DOB: Aug 28, 1975 MRN: [id number] PCP: [name], MD  Service: Procedure DOS: 08/04/2023 Setting: Ambulatory Location: Ambulatory outpatient facility Delivery: Face-to-face Provider: Edward Jolly, MD Specialty: Interventional Pain Management Specialty designation: 09 Location: Outpatient facility Ref. Prov.: [name], MD       Interventional Therapy   Procedure: Sacroiliac Joint Steroid Injection #1 and bilateral piriformis injection  Laterality: Bilateral     Level: PSIS (Posterior inferior  iliac Spine)  Imaging: Fluoroscopic guidance Anesthesia: Local anesthesia (1-2% Lidocaine) Sedation: Minimal Sedation                       DOS: 08/04/2023  Performed by: Edward Jolly, MD  Purpose: Diagnostic/Therapeutic Indications: Sacroiliac joint pain in the lower back and hip area severe enough to impact quality of life or function. Rationale (medical necessity): procedure needed and proper for the diagnosis and/or treatment of Margaret Cuevas's medical symptoms and needs. 1. SI joint arthritis (HCC)   2. Sacroiliac joint pain   3. Piriformis syndrome of both sides    NAS-11 Pain score:   Pre-procedure: 6 /10   Post-procedure: 9  (c/o hip pain on the right, having trouble discerning back from hip pain.)/10     Target: Interarticular sacroiliac joint. Location: Medial to the postero-medial edge of iliac spine. Region: Lumbosacral-sacrococcygeal. Approach: Inferior postero-medial percutaneous approach. Type of procedure: Percutaneous joint injection.  Position / Prep / Materials:   Position: Prone  Prep solution: ChloraPrep (2% chlorhexidine gluconate and 70% isopropyl alcohol) Prep Area: Entire posterior lumbosacral area  Materials:  Tray: Block Needle(s):  Type: Spinal  Gauge (G): 22  Length: 3.5-in Qty: 2  H&P (Pre-op Assessment):  Margaret Cuevas is a 48 y.o. (year old), female patient, seen today for interventional treatment. She  has a past surgical history that includes Abdominal hysterectomy; Gastric bypass (2006); ORIF finger / thumb fracture; Back surgery; Ankle fracture surgery; Cholecystectomy; Ear tube removal; Cesarean section; Appendectomy; Colonoscopy (N/A, 05/04/2022); Esophagogastroduodenoscopy (N/A, 05/04/2022); Right RIGHT L4-5 FAR LATERAL DISCECTOMY (08/07/2022); Anterior lateral lumbar fusion with percutaneous screw 1 level (N/A, 11/09/2022); Application of intraoperative CT scan (N/A, 11/09/2022); and Thoracic laminectomy for spinal cord stimulator (N/A, 04/16/2023). Margaret Cuevas has a current medication list which includes the following prescription(s): acetaminophen, buspirone, cyclobenzaprine, docusate sodium, duloxetine, duloxetine, estradiol, gabapentin, multi-vitamin, multiple vitamins-minerals, omeprazole, oxycodone, polyethylene glycol, scopolamine, sucralfate, enoxaparin, senna, and triamcinolone. Her primarily concern today is the Back Pain (Lower back right buttock)  Initial Vital Signs:  Pulse/HCG Rate: 79ECG Heart Rate: 72 Temp: 97.8 F (36.6 C) Resp: 17 BP: 118/80 SpO2: 95 %  BMI: Estimated body mass index is 37.11 kg/m as calculated from the following:   Height as of this encounter: 5\' 5"  (1.651 m).   Weight as of this encounter: 223 lb (101.2 kg).  Risk Assessment: Allergies: Reviewed. She is allergic to ibuprofen.  Allergy Precautions: None required Coagulopathies: Reviewed. None identified.  Blood-thinner therapy: None at this time Active Infection(s): Reviewed. None identified. Margaret Cuevas is afebrile  Site Confirmation: Ms.  Cuevas was asked to confirm the procedure and laterality before marking the site Procedure checklist: Completed Consent: Before the procedure and under the influence of no sedative(s), amnesic(s), or  anxiolytics, the patient was informed of the treatment options, risks and possible complications. To fulfill our ethical and legal obligations, as recommended by the American Medical Association's Code of Ethics, I have informed the patient of my clinical impression; the nature and purpose of the treatment or procedure; the risks, benefits, and possible complications of the intervention; the alternatives, including doing nothing; the risk(s) and benefit(s) of the alternative treatment(s) or procedure(s); and the risk(s) and benefit(s) of doing nothing. The patient was provided information about the general risks and possible complications associated with the procedure. These may include, but are not limited to: failure to achieve desired goals, infection, bleeding, organ or nerve damage, allergic reactions, paralysis, and death. In addition, the patient was informed of those risks and complications associated to the procedure, such as failure to decrease pain; infection; bleeding; organ or nerve damage with subsequent damage to sensory, motor, and/or autonomic systems, resulting in permanent pain, numbness, and/or weakness of one or several areas of the body; allergic reactions; (i.e.: anaphylactic reaction); and/or death. Furthermore, the patient was informed of those risks and complications associated with the medications. These include, but are not limited to: allergic reactions (i.e.: anaphylactic or anaphylactoid reaction(s)); adrenal axis suppression; blood sugar elevation that in diabetics may result in ketoacidosis or comma; water retention that in patients with history of congestive heart failure may result in shortness of breath, pulmonary edema, and decompensation with resultant heart failure; weight  gain; swelling or edema; medication-induced neural toxicity; particulate matter embolism and blood vessel occlusion with resultant organ, and/or nervous system infarction; and/or aseptic necrosis of one or more joints. Finally, the patient was informed that Medicine is not an exact science; therefore, there is also the possibility of unforeseen or unpredictable risks and/or possible complications that may result in a catastrophic outcome. The patient indicated having understood very clearly. We have given the patient no guarantees and we have made no promises. Enough time was given to the patient to ask questions, all of which were answered to the patient's satisfaction. Ms. Lajara has indicated that she wanted to continue with the procedure. Attestation: I, the ordering provider, attest that I have discussed with the patient the benefits, risks, side-effects, alternatives, likelihood of achieving goals, and potential problems during recovery for the procedure that I have provided informed consent. Date  Time: 08/04/2023  2:24 PM  Pre-Procedure Preparation:  Monitoring: As per clinic protocol. Respiration, ETCO2, SpO2, BP, heart rate and rhythm monitor placed and checked for adequate function Safety Precautions: Patient was assessed for positional comfort and pressure points before starting the procedure. Time-out: I initiated and conducted the "Time-out" before starting the procedure, as per protocol. The patient was asked to participate by confirming the accuracy of the "Time Out" information. Verification of the correct person, site, and procedure were performed and confirmed by me, the nursing staff, and the patient. "Time-out" conducted as per Joint Commission's Universal Protocol (UP.01.01.01). Time: 1539 Start Time: 1539 hrs.  Description/Narrative of Procedure:          Start Time: 1539 hrs.  Rationale (medical necessity): procedure needed and proper for the diagnosis and/or treatment of the  patient's medical symptoms and needs. Procedural Technique Safety Precautions: Aspiration looking for blood return was conducted prior to all injections. At no point did we inject any substances, as a needle was being advanced. No attempts were made at seeking any paresthesias. Safe injection practices and needle disposal techniques used. Medications properly checked for expiration dates. SDV (single dose vial)  medications used. Description of the Procedure: Protocol guidelines were followed. The patient was assisted into a comfortable position. The target area was identified and the area prepped in the usual manner. Skin & deeper tissues infiltrated with local anesthetic. Appropriate amount of time allowed to pass for local anesthetics to take effect. The procedure needles were then advanced to the target area. Proper needle placement secured. Negative aspiration confirmed. Solution injected in intermittent fashion, asking for systemic symptoms every 0.5cc of injectate. The needles were then removed and the area cleansed, making sure to leave some of the prepping solution back to take advantage of its long term bactericidal properties.  Technical description of procedure:  Fluoroscopy using a posterior anterior 45 degree angle from the midline aiming at the anterolateral aspect of the patient was used to find a direct path into the sacroiliac joint, the superior medial to posterior superior iliac spine.  The skin was marked where the desired target and the skin infiltrated with local anesthetics.  The procedure needle was then advanced until the joint was entered.  Once inside of the joint, we then proceeded to inject the desired solution.  10 cc solution made of 9 cc of 0.2% ropivacaine, 1 cc of methylprednisolone, 80 mg/cc.  5 cc injected into the left SI joint, 5 cc injected into the right SI joint.  A left and right piriformis injection was also performed 1 cm inferior, 1 cm lateral and 1 cm deep to  the inferior sacroiliac fissure with contrast highlighting piriformis muscle striations.  10 cc solution made of 9 cc of 0.2% ropivacaine, 1 cc of Decadron 10 mg/cc , 5 cc was injected into the right and left piriformis muscle under fluoroscopy.    Vitals:   08/04/23 1434 08/04/23 1537 08/04/23 1542 08/04/23 1545  BP: 118/80 118/79 119/85 125/86  Pulse: 79     Resp:  17 13 (!) 25  Temp: 97.8 F (36.6 C)     SpO2: 95% 100% 98% 98%  Weight: 223 lb (101.2 kg)     Height: 5\' 5"  (1.651 m)        End Time: 1545 hrs.  Imaging Guidance (Non-Spinal):          Type of Imaging Technique: Fluoroscopy Guidance (Non-Spinal) Indication(s): Fluoroscopy guidance for needle placement to enhance accuracy in procedures requiring precise needle localization for targeted delivery of medication in or near specific anatomical locations not easily accessible without such real-time imaging assistance. Exposure Time: Please see nurses notes. Contrast: Before injecting any contrast, we confirmed that the patient did not have an allergy to iodine, shellfish, or radiological contrast. Once satisfactory needle placement was completed at the desired level, radiological contrast was injected. Contrast injected under live fluoroscopy. No contrast complications. See chart for type and volume of contrast used. Fluoroscopic Guidance: I was personally present during the use of fluoroscopy. "Tunnel Vision Technique" used to obtain the best possible view of the target area. Parallax error corrected before commencing the procedure. "Direction-depth-direction" technique used to introduce the needle under continuous pulsed fluoroscopy. Once target was reached, antero-posterior, oblique, and lateral fluoroscopic projection used confirm needle placement in all planes. Images permanently stored in EMR. Interpretation: I personally interpreted the imaging intraoperatively. Adequate needle placement confirmed in multiple planes. Appropriate  spread of contrast into desired area was observed. No evidence of afferent or efferent intravascular uptake. Permanent images saved into the patient's record.  Post-operative Assessment:  Post-procedure Vital Signs:  Pulse/HCG Rate: 7971 Temp: 97.8 F (36.6 C) Resp: Marland Kitchen)  25 BP: 125/86 SpO2: 98 %  EBL: None  Complications: No immediate post-treatment complications observed by team, or reported by patient.  Note: The patient tolerated the entire procedure well. A repeat set of vitals were taken after the procedure and the patient was kept under observation following institutional policy, for this type of procedure. Post-procedural neurological assessment was performed, showing return to baseline, prior to discharge. The patient was provided with post-procedure discharge instructions, including a section on how to identify potential problems. Should any problems arise concerning this procedure, the patient was given instructions to immediately contact us, at any time, without hesitation. In any case, we plan to contact the patient by telephone for a follow-up status report regarding this interventional procedure.  Comments:  No additional relevant information.  Plan of Care (POC)  Orders:  Orders Placed This Encounter  Procedures   DG PAIN CLINIC C-ARM 1-60 MIN NO REPORT    Intraoperative interpretation by procedural physician at [facility].    Standing Status:   Standing    Number of Occurrences:   1    Reason for exam::   Assistance in needle guidance and placement for procedures requiring needle placement in or near specific anatomical locations not easily accessible without such assistance.     Medications ordered for procedure: Meds ordered this encounter  Medications   iohexol (OMNIPAQUE) 180 MG/ML injection 10 mL    Must be Myelogram-compatible. If not available, you may substitute with a water-soluble, non-ionic, hypoallergenic, myelogram-compatible radiological  contrast medium.   lidocaine (XYLOCAINE) 2 % (with pres) injection 400 mg   diazepam (VALIUM) tablet 5 mg    Make sure Flumazenil is available in the pyxis when using this medication. If oversedation occurs, administer 0.2 mg IV over 15 sec. If after 45 sec no response, administer 0.2 mg again over 1 min; may repeat at 1 min intervals; not to exceed 4 doses (1 mg)   methylPREDNISolone acetate (DEPO-MEDROL) injection 80 mg   ropivacaine (PF) 2 mg/mL (0.2%) (NAROPIN) injection 9 mL   dexamethasone (DECADRON) injection 10 mg   ropivacaine (PF) 2 mg/mL (0.2%) (NAROPIN) injection 9 mL   Medications administered: We administered iohexol, lidocaine, diazepam, methylPREDNISolone acetate, ropivacaine (PF) 2 mg/mL (0.2%), dexamethasone, and ropivacaine (PF) 2 mg/mL (0.2%).  See the medical record for exact dosing, route, and time of administration.  Follow-up plan:   Return in about 6 weeks (around 09/15/2023) for PPE, F2F.       Recent Visits Date Type Provider Dept  07/27/23 Office Visit Edward Jolly, MD [facility]  Showing recent visits within past 90 days and meeting all other requirements Today's Visits Date Type Provider Dept  08/04/23 Procedure visit Edward Jolly, MD [facility]  Showing today's visits and meeting all other requirements Future Appointments Date Type Provider Dept  09/15/23 Appointment Edward Jolly, MD [facility]  Showing future appointments within next 90 days and meeting all other requirements  Disposition: Discharge home  Discharge (Date  Time): 08/04/2023; 1552 hrs.   Primary Care Physician: [name], MD Location: [facility] Note by: Edward Jolly, MD (TTS technology used. I apologize for any typographical errors that were not detected and corrected.) Date: 08/04/2023; Time: 4:12 PM  Disclaimer:  Medicine is not an Visual merchandiser. The only guarantee in medicine is that nothing is guaranteed. It  is important to note that the decision to proceed with this intervention was based on the information collected from the  patient. The Data and conclusions were drawn from the patient's questionnaire, the interview, and the physical examination. Because the information was provided in large part by the patient, it cannot be guaranteed that it has not been purposely or unconsciously manipulated. Every effort has been made to obtain as much relevant data as possible for this evaluation. It is important to note that the conclusions that lead to this procedure are derived in large part from the available data. Always take into account that the treatment will also be dependent on availability of resources and existing treatment guidelines, considered by other Pain Management Practitioners as being common knowledge and practice, at the time of the intervention. For Medico-Legal purposes, it is also important to point out that variation in procedural techniques and pharmacological choices are the acceptable norm. The indications, contraindications, technique, and results of the above procedure should only be interpreted and judged by a Board-Certified Interventional Pain Specialist with extensive familiarity and expertise in the same exact procedure and technique.

## 2023-08-05 ENCOUNTER — Telehealth: Payer: Self-pay

## 2023-08-05 NOTE — Telephone Encounter (Signed)
 Post procedure follow up.  Patient states she is doing ok.

## 2023-08-12 ENCOUNTER — Telehealth: Payer: Self-pay | Admitting: Orthopedic Surgery

## 2023-08-12 DIAGNOSIS — Z9689 Presence of other specified functional implants: Secondary | ICD-10-CM

## 2023-08-12 DIAGNOSIS — G894 Chronic pain syndrome: Secondary | ICD-10-CM

## 2023-08-12 DIAGNOSIS — Z981 Arthrodesis status: Secondary | ICD-10-CM

## 2023-08-12 DIAGNOSIS — M961 Postlaminectomy syndrome, not elsewhere classified: Secondary | ICD-10-CM

## 2023-08-12 MED ORDER — OXYCODONE HCL 5 MG PO TABS: 5.0000 mg | ORAL_TABLET | Freq: Three times a day (TID) | ORAL | 0 refills | Status: DC | PRN

## 2023-08-12 NOTE — Telephone Encounter (Signed)
 She is to discuss Dr. Rhesa Celeste taking over her oxycodone at her follow up on 09/15/23.   Please call and ask if she ever was contacted from [facility].   Refill of oxycodone okay and sent to pharmacy. PMP reviewed and is appropriate.

## 2023-08-16 DIAGNOSIS — M5117 Intervertebral disc disorders with radiculopathy, lumbosacral region: Secondary | ICD-10-CM | POA: Diagnosis not present

## 2023-08-16 DIAGNOSIS — M797 Fibromyalgia: Secondary | ICD-10-CM | POA: Diagnosis not present

## 2023-08-16 DIAGNOSIS — G8921 Chronic pain due to trauma: Secondary | ICD-10-CM | POA: Diagnosis not present

## 2023-08-16 DIAGNOSIS — G894 Chronic pain syndrome: Secondary | ICD-10-CM | POA: Diagnosis not present

## 2023-08-22 ENCOUNTER — Other Ambulatory Visit: Payer: Self-pay | Admitting: Neurosurgery

## 2023-08-23 DIAGNOSIS — G894 Chronic pain syndrome: Secondary | ICD-10-CM

## 2023-08-23 DIAGNOSIS — M961 Postlaminectomy syndrome, not elsewhere classified: Secondary | ICD-10-CM

## 2023-08-23 NOTE — Telephone Encounter (Signed)
 Maximum dose of gabapentin  is 3600mg  per day- which is what she is taking.   Message to verify how much/how often she is taking neurontin . Last refill was 05/28/23 - she should have been out a month ago.

## 2023-08-24 MED ORDER — GABAPENTIN 600 MG PO TABS: 1200.0000 mg | ORAL_TABLET | Freq: Three times a day (TID) | ORAL | 1 refills | Status: AC

## 2023-08-24 NOTE — Telephone Encounter (Signed)
 Discussed neurontin  refill with Dr. Mont Antis. Okay to fill neurontin  1200mg  tid, but she will need to get further refills from pain management. Please let her know.

## 2023-08-25 DIAGNOSIS — G4733 Obstructive sleep apnea (adult) (pediatric): Secondary | ICD-10-CM | POA: Diagnosis not present

## 2023-09-06 DIAGNOSIS — Z87891 Personal history of nicotine dependence: Secondary | ICD-10-CM | POA: Diagnosis not present

## 2023-09-06 DIAGNOSIS — Z9884 Bariatric surgery status: Secondary | ICD-10-CM | POA: Diagnosis not present

## 2023-09-06 DIAGNOSIS — R197 Diarrhea, unspecified: Secondary | ICD-10-CM | POA: Diagnosis not present

## 2023-09-06 DIAGNOSIS — Z48815 Encounter for surgical aftercare following surgery on the digestive system: Secondary | ICD-10-CM | POA: Diagnosis not present

## 2023-09-06 DIAGNOSIS — Z6836 Body mass index (BMI) 36.0-36.9, adult: Secondary | ICD-10-CM | POA: Diagnosis not present

## 2023-09-06 DIAGNOSIS — Z713 Dietary counseling and surveillance: Secondary | ICD-10-CM | POA: Diagnosis not present

## 2023-09-06 DIAGNOSIS — E669 Obesity, unspecified: Secondary | ICD-10-CM | POA: Diagnosis not present

## 2023-09-06 DIAGNOSIS — K912 Postsurgical malabsorption, not elsewhere classified: Secondary | ICD-10-CM | POA: Diagnosis not present
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
## 2023-09-15 ENCOUNTER — Ambulatory Visit: Admitting: Student in an Organized Health Care Education/Training Program

## 2023-09-15 ENCOUNTER — Encounter: Payer: Self-pay | Admitting: Psychiatry

## 2023-09-15 ENCOUNTER — Telehealth: Admitting: Psychiatry

## 2023-09-15 DIAGNOSIS — F330 Major depressive disorder, recurrent, mild: Secondary | ICD-10-CM | POA: Diagnosis not present

## 2023-09-15 DIAGNOSIS — F419 Anxiety disorder, unspecified: Secondary | ICD-10-CM

## 2023-09-15 NOTE — Patient Instructions (Signed)
 Increase duloxetine  120 mg daily Continue BuSpar  7.5 mg twice a day Next appointment: 6/24 at 11 am

## 2023-09-15 NOTE — Progress Notes (Signed)
 PROVIDER NOTE: Interpretation of information contained herein should be left to medically-trained personnel. Specific patient instructions are provided elsewhere under "Patient Instructions" section of medical record. This document was created in part using AI and STT-dictation technology, any transcriptional errors that may result from this process are unintentional.  Patient: Margaret Cuevas  Service: E/M   PCP: Margaret Crooks, MD  DOB: [birth year]  DOS: 09/16/2023  Provider: Cherylin Corrigan, NP  MRN: [id number]  Delivery: Face-to-face  Specialty: Interventional Pain Management  Type: Established Patient  Setting: Ambulatory outpatient facility  Specialty designation: 09  Referring Prov.: Margaret Crooks, MD  Location: Outpatient office facility       HPI  [name], a 48 y.o. year old female, is here today because of her Sacroiliac joint pain [M53.3]. [name] primary complain today is Back Pain (Lower back and right SI joint and bilateral hips)  Pertinent problems: [name] does not have any pertinent problems on file. Pain Assessment: Severity of Chronic pain is reported as a 8 /10. Location: Back Lower/radiates to bilateral hips and right outer thigh. Onset: More than a month ago. Quality: Other (Comment) (feels like i got hit with an ax). Timing: Constant. Modifying factor(s): meds, ice. Vitals:  height is 5\' 5"  (1.651 m) and weight is 220 lb (99.8 kg). Her temperature is 97.1 F (36.2 C) (abnormal). Her blood pressure is 107/75 and her pulse is 78. Her oxygen saturation is 96%.  BMI: Estimated body mass index is 36.61 kg/m as calculated from the following:   Height as of this encounter: 5\' 5"  (1.651 m).   Weight as of this encounter: 220 lb (99.8 kg). Last encounter: 07/27/2023 Last procedure: 08/04/2023  Reason for encounter: post-procedure evaluation and assessment.   [name] underwent a sacroiliac joint steroid injection and bilateral piriformis injection on August 04, 2023.   She reported approximately 40% improvement within the first hour postprocedure.  After the local anesthetic wore off, she continued to experience about 50% improvement in her symptoms, which lasted until yesterday.  However, as of yesterday, she began experiencing a return of symptoms, specifically noting shooting and stabbing pain in her back and bilateral hips.   The patient also reports that she visited another pain clinic for medication management, where she was prescribed pain medication by Dr. Meredeth Cuevas.    Procedure: Sacroiliac Joint Steroid Injection #1 and bilateral piriformis injection   Laterality: Bilateral     Level: PSIS (Posterior inferior  iliac Spine)  Imaging: Fluoroscopic guidance Anesthesia: Local anesthesia (1-2% Lidocaine ) Sedation: Minimal Sedation                       DOS: 08/04/2023  Performed by: Margaret Collin, MD   Purpose: Diagnostic/Therapeutic Indications: Sacroiliac joint pain in the lower back and hip area severe enough to impact quality of life or function. Rationale (medical necessity): procedure needed and proper for the diagnosis and/or treatment of [name] medical symptoms and needs. 1. SI joint arthritis (HCC)   2. Sacroiliac joint pain   3. Piriformis syndrome of both sides     NAS-11 Pain score:        Pre-procedure: 6 /10        Post-procedure: 9  (c/o hip pain on the right, having trouble discerning back from hip pain.)/10    Post-procedure evaluation : Effectiveness:  Initial hour after procedure: 40 %  Subsequent 4-6 hours post-procedure: 50 %  Analgesia past initial 6 hours: 0 %  Ongoing improvement:  Analgesic: [name] underwent a sacroiliac joint steroid injection and bilateral piriformis injection on August 04, 2023.  She reported approximately 40% improvement within the first hour postprocedure.  After the local anesthetic wore off, she continued to experience about 50% improvement in her symptoms, which lasted until yesterday.  However,  as of yesterday, she began experiencing a return of symptoms, specifically noting shooting pain in her back.  Function: Back to baseline ROM: Back to baseline  Pharmacotherapy Assessment  Analgesic: Monitoring: [place] PMP: PDMP reviewed during this encounter.       Pharmacotherapy: No side-effects or adverse reactions reported. Compliance: No problems identified. Effectiveness: Clinically acceptable.  Margaret Staple, RN  09/16/2023 10:12 AM  Sign when Signing Visit Safety precautions to be maintained throughout the outpatient stay will include: orient to surroundings, keep bed in low position, maintain call bell within reach at all times, provide assistance with transfer out of bed and ambulation.   Pt left before RN could reivew discharge instructions; no Rx written; pt will follow up if needed     No results found for: "CBDTHCR" No results found for: "D8THCCBX" No results found for: "D9THCCBX"  UDS:  No results found for: "SUMMARY"    ROS  Constitutional: Denies any fever or chills Gastrointestinal: No reported hemesis, hematochezia, vomiting, or acute GI distress Musculoskeletal: Bilateral hips and low back pain and right SI joint pain  Neurological: No reported episodes of acute onset apraxia, aphasia, dysarthria, agnosia, amnesia, paralysis, loss of coordination, or loss of consciousness  Medication Review  Buprenorphine HCl, DULoxetine , Multi-Vitamin, Multiple Vitamins-Minerals, acetaminophen , busPIRone , cyclobenzaprine , docusate sodium , enoxaparin , estradiol , gabapentin , omeprazole, oxyCODONE , polyethylene glycol, scopolamine, senna, sucralfate , and triamcinolone   History Review  Allergy: [name] is allergic to ibuprofen. Drug: [name]  reports no history of drug use. Alcohol:  reports current alcohol use. Tobacco:  reports that she has quit smoking. Her smoking use included cigarettes. She has been exposed to tobacco smoke. She has never used smokeless tobacco. Social:  [name]  reports that she has quit smoking. Her smoking use included cigarettes. She has been exposed to tobacco smoke. She has never used smokeless tobacco. She reports current alcohol use. She reports that she does not use drugs. Medical:  has a past medical history of Anemia, Anginal pain (HCC), Anxiety, Arthritis, Asthma, Chest pain, Chronic pain syndrome, Complication of anesthesia, Failed back surgical syndrome, Fibromyalgia, GERD (gastroesophageal reflux disease), Hiatal hernia, History of Roux-en-Y gastric bypass (2005), Lipoma of back, Lumbar disc herniation, Lumbar radiculopathy, Obesity, OSA on CPAP, and Pneumonia. Surgical: Ms. Cerveny  has a past surgical history that includes Abdominal hysterectomy; Gastric bypass (2006); ORIF finger / thumb fracture; Back surgery; Ankle fracture surgery; Cholecystectomy; Ear tube removal; Cesarean section; Appendectomy; Colonoscopy (N/A, 05/04/2022); Esophagogastroduodenoscopy (N/A, 05/04/2022); Right RIGHT L4-5 FAR LATERAL DISCECTOMY (08/07/2022); Anterior lateral lumbar fusion with percutaneous screw 1 level (N/A, 11/09/2022); Application of intraoperative CT scan (N/A, 11/09/2022); and Thoracic laminectomy for spinal cord stimulator (N/A, 04/16/2023). Family: family history includes Breast cancer (age of onset: 37) in her cousin; Cancer in her maternal grandmother and paternal grandfather; Diabetes in her maternal grandmother and mother; Drug abuse in her sister; Hypertension in her father, maternal grandmother, and paternal grandmother.  Laboratory Chemistry Profile   Renal Lab Results  Component Value Date   BUN 18 04/06/2023   CREATININE 0.75 04/06/2023   BCR 27 (H) 11/14/2021   GFRAA >60 05/22/2017   GFRNONAA >60 04/06/2023    Hepatic Lab Results  Component Value Date   AST 19 06/27/2020   ALT 15 06/27/2020   ALBUMIN 4.1 11/14/2021   ALKPHOS 55 06/27/2020   LIPASE 24 06/27/2020    Electrolytes Lab Results  Component Value Date   NA 138  04/06/2023   K 4.5 04/06/2023   CL 103 04/06/2023   CALCIUM 9.1 04/06/2023   PHOS 4.6 (H) 11/14/2021    Bone No results found for: "VD25OH", "VD125OH2TOT", "GL8756EP3", "IR5188CZ6", "25OHVITD1", "25OHVITD2", "25OHVITD3", "TESTOFREE", "TESTOSTERONE "  Inflammation (CRP: Acute Phase) (ESR: Chronic Phase) Lab Results  Component Value Date   ESRSEDRATE 5 07/03/2016         Note: Above Lab results reviewed.  Recent Imaging Review  DG PAIN CLINIC C-ARM 1-60 MIN NO REPORT Fluoro was used, but no Radiologist interpretation will be provided.  Please refer to "NOTES" tab for provider progress note. Note: Reviewed         Physical Exam  General appearance: Well nourished, well developed, and well hydrated. In no apparent acute distress Mental status: Alert, oriented x 3 (person, place, & time)       Respiratory: No evidence of acute respiratory distress Eyes: PERLA Vitals: BP 107/75   Pulse 78   Temp (!) 97.1 F (36.2 C)   Ht 5\' 5"  (1.651 m)   Wt 220 lb (99.8 kg)   SpO2 96%   BMI 36.61 kg/m  BMI: Estimated body mass index is 36.61 kg/m as calculated from the following:   Height as of this encounter: 5\' 5"  (1.651 m).   Weight as of this encounter: 220 lb (99.8 kg). Ideal: Ideal body weight: 57 kg (125 lb 10.6 oz) Adjusted ideal body weight: 74.1 kg (163 lb 6.4 oz)  Assessment   Diagnosis Status  1. Sacroiliac joint pain   2. Piriformis syndrome of both sides   3. SI joint arthritis (HCC)   4. Failed back surgical syndrome   5. Lumbar post-laminectomy syndrome   6. Chronic pain syndrome   7. Chronic radicular lumbar pain   8. Fibromyalgia    Reoccurring Reoccurring Controlled   Updated Problems: No problems updated.  Plan of Care  Problem-specific:  Assessment and Plan Today's visit is a postprocedure evaluation.  The patient reports no relief from the previous procedure.  Advised to schedule a follow-up appointment with Dr. Lateef for reevaluation and discussion of  possible alternative interventional therapies.   Ms. Genna Casimir has a current medication list which includes the following long-term medication(s): duloxetine , estradiol , gabapentin , omeprazole, duloxetine , sucralfate , and triamcinolone .  Pharmacotherapy (Medications Ordered): No orders of the defined types were placed in this encounter.  Orders:  No orders of the defined types were placed in this encounter.  Follow-up plan:   No follow-ups on file.        Recent Visits Date Type Provider Dept  08/04/23 Procedure visit Margaret Collin, MD [facility]  07/27/23 Office Visit Margaret Collin, MD [facility]  Showing recent visits within past 90 days and meeting all other requirements Today's Visits Date Type Provider Dept  09/16/23 Office Visit Chudney Scheffler K, NP [facility]  Showing today's visits and meeting all other requirements Future Appointments No visits were found meeting these conditions. Showing future appointments within next 90 days and meeting all other requirements   I discussed the assessment and treatment plan with the patient. The patient was provided an opportunity to ask questions and all were answered. The patient agreed with the plan and demonstrated an understanding of the  instructions.  Patient advised to call back or seek an in-person evaluation if the symptoms or condition worsens.  Duration of encounter: 25 minutes.  Total time on encounter, as per AMA guidelines included both the face-to-face and non-face-to-face time personally spent by the physician and/or other qualified health care professional(s) on the day of the encounter (includes time in activities that require the physician or other qualified health care professional and does not include time in activities normally performed by clinical staff). Physician's time may include the following activities when performed: Preparing to see the patient (e.g., pre-charting review  of records, searching for previously ordered imaging, lab work, and nerve conduction tests) Review of prior analgesic pharmacotherapies. Reviewing PMP Interpreting ordered tests (e.g., lab work, imaging, nerve conduction tests) Performing post-procedure evaluations, including interpretation of diagnostic procedures Obtaining and/or reviewing separately obtained history Performing a medically appropriate examination and/or evaluation Counseling and educating the patient/family/caregiver Ordering medications, tests, or procedures Referring and communicating with other health care professionals (when not separately reported) Documenting clinical information in the electronic or other health record Independently interpreting results (not separately reported) and communicating results to the patient/ family/caregiver Care coordination (not separately reported)  Note by: Ahmadou Bolz K Jania Steinke, NP (TTS and AI technology used. I apologize for any typographical errors that were not detected and corrected.) Date: 09/16/2023; Time: 11:58 AM

## 2023-09-16 ENCOUNTER — Ambulatory Visit: Attending: Student in an Organized Health Care Education/Training Program | Admitting: Nurse Practitioner

## 2023-09-16 ENCOUNTER — Encounter: Payer: Self-pay | Admitting: Nurse Practitioner

## 2023-09-16 VITALS — BP 107/75 | HR 78 | Temp 97.1°F | Ht 65.0 in | Wt 220.0 lb

## 2023-09-16 DIAGNOSIS — G894 Chronic pain syndrome: Secondary | ICD-10-CM | POA: Diagnosis not present

## 2023-09-16 DIAGNOSIS — G8929 Other chronic pain: Secondary | ICD-10-CM | POA: Diagnosis not present

## 2023-09-16 DIAGNOSIS — M961 Postlaminectomy syndrome, not elsewhere classified: Secondary | ICD-10-CM

## 2023-09-16 DIAGNOSIS — M533 Sacrococcygeal disorders, not elsewhere classified: Secondary | ICD-10-CM

## 2023-09-16 DIAGNOSIS — M5416 Radiculopathy, lumbar region: Secondary | ICD-10-CM | POA: Diagnosis not present

## 2023-09-16 DIAGNOSIS — M461 Sacroiliitis, not elsewhere classified: Secondary | ICD-10-CM

## 2023-09-16 DIAGNOSIS — G5703 Lesion of sciatic nerve, bilateral lower limbs: Secondary | ICD-10-CM

## 2023-09-16 DIAGNOSIS — M797 Fibromyalgia: Secondary | ICD-10-CM

## 2023-09-16 NOTE — Progress Notes (Signed)
 Safety precautions to be maintained throughout the outpatient stay will include: orient to surroundings, keep bed in low position, maintain call bell within reach at all times, provide assistance with transfer out of bed and ambulation.   Pt left before RN could reivew discharge instructions; no Rx written; pt will follow up if needed

## 2023-09-25 DIAGNOSIS — G4733 Obstructive sleep apnea (adult) (pediatric): Secondary | ICD-10-CM | POA: Diagnosis not present

## 2023-09-28 ENCOUNTER — Ambulatory Visit: Admitting: Licensed Clinical Social Worker

## 2023-09-28 DIAGNOSIS — F330 Major depressive disorder, recurrent, mild: Secondary | ICD-10-CM | POA: Diagnosis not present

## 2023-09-28 DIAGNOSIS — M1612 Unilateral primary osteoarthritis, left hip: Secondary | ICD-10-CM | POA: Diagnosis not present

## 2023-09-28 DIAGNOSIS — F419 Anxiety disorder, unspecified: Secondary | ICD-10-CM | POA: Diagnosis not present

## 2023-09-28 NOTE — Progress Notes (Signed)
 THERAPIST PROGRESS NOTE  Virtual Visit via Video Note  I connected with Margaret Cuevas on 09/28/23 at 10:00 AM EDT by a video enabled telemedicine application and verified that I am speaking with the correct person using two identifiers.  Location: Patient: Address on file Provider: [facility]   I discussed the limitations of evaluation and management by telemedicine and the availability of in person appointments. The patient expressed understanding and agreed to proceed.    I discussed the assessment and treatment plan with the patient. The patient was provided an opportunity to ask questions and all were answered. The patient agreed with the plan and demonstrated an understanding of the instructions.   The patient was advised to call back or seek an in-person evaluation if the symptoms worsen or if the condition fails to improve as anticipated.  I provided 58 minutes of non-face-to-face time during this encounter.   Marvin Slot, LCSW   Session Time: [id number]  Participation Level: Active  Behavioral Response: CasualAlertEuthymic  Type of Therapy: Individual Therapy  Treatment Goals addressed: LTG: Reduce frequency, intensity, and duration of depression symptoms so that daily functioning is improved (OP Depression)  LTG: Increase coping skills to manage depression and improve ability to perform daily activities (OP Depression)  STG: Kiyana will identify cognitive patterns and beliefs that support depression (OP Depression)  STG: "I have to figure out a way to not put so much pressure on myself to avoid feeling this sense of worthlessness." (OP Depression)  LTG: Elimination of maladaptive behaviors and thinking patterns which interfere with resolution of trauma as evidenced by self report (BH CCP Acute or Chronic Trauma Reaction)  LTG: Develop and implement effective coping skills to carry out normal responsibilities and participate constructively in relationships as  evidenced by self report (BH CCP Acute or Chronic Trauma Reaction)  LTG: Recall traumatic events without becoming overwhelmed with negative emotions (BH CCP Acute or Chronic Trauma Reaction)  STG: Paulena will identify internal and external stimuli that trigger PTSD symptoms (BH CCP Acute or Chronic Trauma Reaction)  ProgressTowards Goals: Initial  Interventions: CBT, Supportive, and Reframing  Summary: Margaret Cuevas is a 48 y.o. female who presents with sxs of anxiety ad depression. Identifies sxs to include but not limited to Change in energy/activity; Difficulty Concentrating; Fatigue; Hopelessness; Increase/decrease in appetite; Irritability; Sleep (too much); Weight gain/loss; Worthlessness; Difficulty concentrating; Fatigue; Irritability; Restlessness; Sleep; Tension; Worrying. Pt was oriented times 5. Pt was cooperative and engaged. Pt denies SI/HI/AVH.     Patient utilized the therapeutic space to share her physical health is still not resolved and her mental health has worsened as she is often alone and aware of her physical conditions. Reflected on ways family has been able to feel comfortable with supporting her through these challenges. Reflected on complications with a new pain management medication as she is experiencing drowsiness and difficulty in functioning as a result of side effects. Processed fears of the impact of familial substance abuse on her ability to cope with her current situation.   Identified she feels stuck physically, mentally, spiritually and she is not experiencing relief.   Patient shared recent triggering encounter where she felt distanced from her loved one as a result of flashbacks. Connected ways she used to cope with disagreements within her marriage when she was able to work versus being in the home more often. Reflected on stressors within her marriage and effort she has made to communicate her concerns related to unresolved grief for both parties.  Clinician and patient worked together to establish controllable factors.  Patient was able to demonstrate progress since she was able to reframe negative perspectives.  Cln and patient reviewed her treatment plan. She consenting to treatment and provided permission for the Cln to sign on her behalf due to her session being virtual.   Suicidal/Homicidal: Nowithout intent/plan  Therapist Response: Clinician utilized active and supportive reflection to create a safe space for patient to process recent life events.  Test for safety, stressors, symptoms since last session.  Clinician worked with the patient on processing the emotional toll of her physical conditions have resulted and as well as applications to her marriage.  Clinician worked with patient on identifying ways in which she can reframe negative perspectives and identify controllable factors.  Plan: Return again in 2 weeks.  Diagnosis: MDD (major depressive disorder), recurrent episode, mild (HCC)  Anxiety disorder, unspecified type   Collaboration of Care: AEB psychiatrist can access notes and cln. Will review psychiatrists' notes. Check in with the patient and will see LCSW per availability. Patient agreed with treatment recommendations.   Patient/Guardian was advised Release of Information must be obtained prior to any record release in order to collaborate their care with an outside provider. Patient/Guardian was advised if they have not already done so to contact the registration department to sign all necessary forms in order for us  to release information regarding their care.   Consent: Patient/Guardian gives verbal consent for treatment and assignment of benefits for services provided during this visit. Patient/Guardian expressed understanding and agreed to proceed.   Marvin Slot, LCSW 09/28/2023

## 2023-10-01 ENCOUNTER — Other Ambulatory Visit: Payer: Self-pay | Admitting: Psychiatry

## 2023-10-01 DIAGNOSIS — F419 Anxiety disorder, unspecified: Secondary | ICD-10-CM

## 2023-10-01 MED ORDER — BUSPIRONE HCL 7.5 MG PO TABS: 7.5000 mg | ORAL_TABLET | Freq: Two times a day (BID) | ORAL | 0 refills | Status: DC

## 2023-10-11 ENCOUNTER — Ambulatory Visit (INDEPENDENT_AMBULATORY_CARE_PROVIDER_SITE_OTHER): Admitting: Licensed Clinical Social Worker

## 2023-10-11 DIAGNOSIS — Z91199 Patient's noncompliance with other medical treatment and regimen due to unspecified reason: Secondary | ICD-10-CM

## 2023-10-11 NOTE — Progress Notes (Signed)
  Clinician attempted session via telehealth, but Margaret Cuevas did not join. Cln. Sent patient link to join via text. Per [employer], s/he will be charged a no-show fee.
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
## 2023-10-18 ENCOUNTER — Ambulatory Visit: Payer: Self-pay

## 2023-10-18 ENCOUNTER — Ambulatory Visit: Admitting: Student

## 2023-10-18 ENCOUNTER — Encounter: Payer: Self-pay | Admitting: Student

## 2023-10-18 VITALS — BP 114/78 | HR 86 | Ht 65.0 in | Wt 219.4 lb

## 2023-10-18 DIAGNOSIS — R600 Localized edema: Secondary | ICD-10-CM | POA: Insufficient documentation

## 2023-10-18 DIAGNOSIS — M5117 Intervertebral disc disorders with radiculopathy, lumbosacral region: Secondary | ICD-10-CM | POA: Diagnosis not present

## 2023-10-18 DIAGNOSIS — G8921 Chronic pain due to trauma: Secondary | ICD-10-CM | POA: Diagnosis not present

## 2023-10-18 DIAGNOSIS — M797 Fibromyalgia: Secondary | ICD-10-CM | POA: Diagnosis not present

## 2023-10-18 DIAGNOSIS — G894 Chronic pain syndrome: Secondary | ICD-10-CM | POA: Diagnosis not present

## 2023-10-18 NOTE — Telephone Encounter (Signed)
 FYI Only or Action Required?: FYI only for provider.  Patient was last seen in primary care on 02/26/2023 by Joshua Cathryne BROCKS, MD. Called Nurse Triage reporting Leg Swelling. Symptoms began several weeks ago. Interventions attempted: Nothing. Symptoms are: gradually worsening.  Triage Disposition: See Physician Within 24 Hours  Patient/caregiver understands and will follow disposition?: Yes  Copied from CRM [id number]. Topic: Clinical - Red Word Triage >> Oct 18, 2023 10:57 AM Larissa RAMAN wrote: [facility] that prompted transfer to Nurse Triage: swelling in legs and feet Reason for Disposition  [1] MODERATE leg swelling (e.g., swelling extends up to knees) AND [2] new-onset or worsening  Answer Assessment - Initial Assessment Questions Pt states was at pain management office today and that the MD stated she needs to see her PCP right away tday for labs and further F/U with labs abd eval. regarding her persistent bilateral legs and feet swelling that has 1 inch pitting ededma. Pt calling to make an appmt, and made with MD Lemon because past MD PCP retired.   1. ONSET: When did the swelling start? (e.g., minutes, hours, days)     Last few weeks on and off 2. LOCATION: What part of the leg is swollen?  Are both legs swollen or just one leg?     Bilateral legs & feet 3. SEVERITY: How bad is the swelling? (e.g., localized; mild, moderate, severe)   - Localized: Small area of swelling localized to one leg.   - MILD pedal edema: Swelling limited to foot and ankle, pitting edema < 1/4 inch (6 mm) deep, rest and elevation eliminate most or all swelling.   - MODERATE edema: Swelling of lower leg to knee, pitting edema > 1/4 inch (6 mm) deep, rest and elevation only partially reduce swelling.   - SEVERE edema: Swelling extends above knee, facial or hand swelling present.      Mild with 1 inch pitting edema  5. PAIN: Is the swelling painful to touch? If Yes, ask: How painful is it?   (Scale 1-10;  mild, moderate or severe)     mild 7. CAUSE: What do you think is causing the leg swelling?     unknown 8. MEDICAL HISTORY: Do you have a history of blood clots (e.g., DVT), cancer, heart failure, kidney disease, or liver failure?     no 9. RECURRENT SYMPTOM: Have you had leg swelling before? If Yes, ask: When was the last time? What happened that time?     Past few weeks 10. OTHER SYMPTOMS: Do you have any other symptoms? (e.g., chest pain, difficulty breathing)       Pt states has had a lot of recent hip/back injections  Protocols used: Leg Swelling and Edema-A-AH

## 2023-10-18 NOTE — Telephone Encounter (Signed)
 Noted. Pt has an apt.   Margaret Cuevas.

## 2023-10-18 NOTE — Assessment & Plan Note (Addendum)
 2+ LE edema on exam today. Weight appears to be down 1 pound since last visit. Does not have significant DOE or orthopnea, no JVD on exam or crackles on auscultation to suggest acute heart failure. Wells score for DVT is low. She does take gabapentin  but this is a chronic medication otherwise does not appear to be taking medications that may cause LE edema. Suspect venous insufficiency. Will check ddimer and CMP today. Discussed elevation, compression, and physical activity.

## 2023-10-18 NOTE — Progress Notes (Signed)
 Established Patient Office Visit  Subjective   Patient ID: Margaret Cuevas, female    DOB: [birth year]  Age: 48 y.o. MRN: [id number]  Chief Complaint  Patient presents with   Leg Swelling    Bil leg and feet swelling x 2 weeks. Patient seen pain specialist this morning and was told she needed to be seen today or go to ER because something is wrong. Patient has hx of back surgery x 3 and just has distal bypass this year.     Swelling in the legs for the past 2 weeks. Reports swelling comes and goes initially but in the last several days has been persistent. Feels legs are tight and uncomfortable. Duloxetine  dose was increased and she has been out of estrogen patch for about 1 week but otherwise no significant changes in her medications. Denies changes in her diet or activity. She uses a walker to ambulate and gets up and down frequently. Denies recent travel. She had distal bypass for obesity in February. She denies fevers, chills, chest pain, DOE, orthopnea, dizziness, lightheadedness, calf tenderness, or recent immobility.   Patient Active Problem List   Diagnosis Date Noted   Bilateral leg edema 10/18/2023   SI joint arthritis (HCC) 07/27/2023   Sacroiliac joint pain 07/27/2023   Piriformis syndrome of both sides 07/27/2023   Chronic pain 04/16/2023   Failed back surgical syndrome 02/11/2023   Chronic pain syndrome 02/11/2023   S/P lumbar fusion 11/09/2022   Recurrent herniation of lumbar disc 11/09/2022   Spinal stenosis of lumbar region without neurogenic claudication 11/09/2022   Chronic radicular lumbar pain 08/07/2022   OSA on CPAP 05/07/2021   Anxiety, generalized 05/07/2021   H/O total hysterectomy 07/15/2020   History of appendectomy 07/15/2020   Lipoma of back 11/15/2018   Fibromyalgia 10/25/2017   Influenza vaccine needed 01/20/2017      ROS Refer to HPI    Objective:     BP 114/78   Pulse 86   Ht 5' 5 (1.651 m)   Wt 219 lb 6.4 oz (99.5 kg)   SpO2 96%    BMI 36.51 kg/m  BP Readings from Last 3 Encounters:  10/18/23 114/78  09/16/23 107/75  08/04/23 125/86    Physical Exam Constitutional:      Appearance: Normal appearance.  HENT:     Head: Normocephalic and atraumatic.   Cardiovascular:     Rate and Rhythm: Normal rate and regular rhythm.     Comments: No JVD, normal DP and PT pulses bilaterally Pulmonary:     Effort: Pulmonary effort is normal.     Breath sounds: No rhonchi or rales.  Abdominal:     General: Abdomen is flat. Bowel sounds are normal. There is no distension.     Palpations: Abdomen is soft.     Tenderness: There is no abdominal tenderness. There is no guarding.   Musculoskeletal:        General: Normal range of motion.     Right lower leg: Edema present.     Left lower leg: Edema present.   Skin:    General: Skin is warm and dry.     Capillary Refill: Capillary refill takes less than 2 seconds.     Coloration: Skin is not jaundiced.   Neurological:     General: No focal deficit present.     Mental Status: She is alert and oriented to person, place, and time.   Psychiatric:        Mood and Affect:  Mood normal.        Behavior: Behavior normal.        10/18/2023    1:24 PM 07/27/2023    1:13 PM 07/27/2023    9:29 AM  Depression screen PHQ 2/9  Decreased Interest 2  0  Down, Depressed, Hopeless 1  1  PHQ - 2 Score 3  1  Altered sleeping 3    Tired, decreased energy 0    Change in appetite 2    Feeling bad or failure about yourself  3    Trouble concentrating 3    Moving slowly or fidgety/restless 2    Suicidal thoughts 1    PHQ-9 Score 17    Difficult doing work/chores Somewhat difficult       Information is confidential and restricted. Go to Review Flowsheets to unlock data.       10/18/2023    1:25 PM 02/26/2023    9:45 AM 01/25/2023   11:34 AM 09/22/2022    9:04 AM  GAD 7 : Generalized Anxiety Score  Nervous, Anxious, on Edge 1 1  0  Control/stop worrying 2 1  0  Worry too much -  different things 2 1  0  Trouble relaxing 2 2  0  Restless 2 1  0  Easily annoyed or irritable 3 1  0  Afraid - awful might happen 3 1  0  Total GAD 7 Score 15 8  0  Anxiety Difficulty Somewhat difficult Somewhat difficult  Not difficult at all     Information is confidential and restricted. Go to Review Flowsheets to unlock data.    No results found for any visits on 10/18/23.  Last CBC Lab Results  Component Value Date   WBC 5.6 04/06/2023   HGB 11.2 (L) 04/06/2023   HCT 35.6 (L) 04/06/2023   MCV 87.7 04/06/2023   MCH 27.6 04/06/2023   RDW 15.0 04/06/2023   PLT 256 04/06/2023      The 10-year ASCVD risk score (Arnett DK, et al., 2019) is: 0.5%    Assessment & Plan:  Bilateral leg edema Assessment & Plan: 2+ LE edema on exam today. Weight appears to be down 1 pound since last visit. Does not have significant DOE or orthopnea, no JVD on exam or crackles on auscultation to suggest acute heart failure. Wells score for DVT is low. She does take gabapentin  but this is a chronic medication otherwise does not appear to be taking medications that may cause LE edema. Suspect venous insufficiency. Will check ddimer and CMP today. Discussed elevation, compression, and physical activity.   Orders: -     D-dimer, quantitative -     Comprehensive metabolic panel with GFR -     CBC with Differential/Platelet     Return in about 4 weeks (around 11/15/2023) for Transfer of care.    Harlene Saddler, MD

## 2023-10-19 ENCOUNTER — Telehealth (INDEPENDENT_AMBULATORY_CARE_PROVIDER_SITE_OTHER): Payer: Self-pay | Admitting: Psychiatry

## 2023-10-19 DIAGNOSIS — Z91199 Patient's noncompliance with other medical treatment and regimen due to unspecified reason: Secondary | ICD-10-CM

## 2023-10-19 LAB — CBC WITH DIFFERENTIAL/PLATELET
Basophils Absolute: 0 10*3/uL (ref 0.0–0.2)
Basos: 1 %
EOS (ABSOLUTE): 0.2 10*3/uL (ref 0.0–0.4)
Eos: 4 %
Hematocrit: 36.9 % (ref 34.0–46.6)
Hemoglobin: 11.5 g/dL (ref 11.1–15.9)
Immature Grans (Abs): 0 10*3/uL (ref 0.0–0.1)
Immature Granulocytes: 0 %
Lymphocytes Absolute: 2.1 10*3/uL (ref 0.7–3.1)
Lymphs: 38 %
MCH: 31.9 pg (ref 26.6–33.0)
MCHC: 31.2 g/dL — ABNORMAL LOW (ref 31.5–35.7)
MCV: 102 fL — ABNORMAL HIGH (ref 79–97)
Monocytes Absolute: 0.4 10*3/uL (ref 0.1–0.9)
Monocytes: 7 %
Neutrophils Absolute: 2.8 10*3/uL (ref 1.4–7.0)
Neutrophils: 50 %
Platelets: 252 10*3/uL (ref 150–450)
RBC: 3.61 x10E6/uL — ABNORMAL LOW (ref 3.77–5.28)
RDW: 12.2 % (ref 11.7–15.4)
WBC: 5.5 10*3/uL (ref 3.4–10.8)

## 2023-10-19 LAB — COMPREHENSIVE METABOLIC PANEL WITH GFR
ALT: 20 IU/L (ref 0–32)
AST: 17 IU/L (ref 0–40)
Albumin: 3.5 g/dL — ABNORMAL LOW (ref 3.9–4.9)
Alkaline Phosphatase: 83 IU/L (ref 44–121)
BUN/Creatinine Ratio: 32 — ABNORMAL HIGH (ref 9–23)
BUN: 21 mg/dL (ref 6–24)
Bilirubin Total: 0.4 mg/dL (ref 0.0–1.2)
CO2: 21 mmol/L (ref 20–29)
Calcium: 9.1 mg/dL (ref 8.7–10.2)
Chloride: 103 mmol/L (ref 96–106)
Creatinine, Ser: 0.65 mg/dL (ref 0.57–1.00)
Globulin, Total: 1.6 g/dL (ref 1.5–4.5)
Glucose: 90 mg/dL (ref 70–99)
Potassium: 4.7 mmol/L (ref 3.5–5.2)
Sodium: 140 mmol/L (ref 134–144)
Total Protein: 5.1 g/dL — ABNORMAL LOW (ref 6.0–8.5)
eGFR: 111 mL/min/{1.73_m2} (ref >59)

## 2023-10-19 LAB — D-DIMER, QUANTITATIVE: D-DIMER: 0.3 mg{FEU}/L (ref 0.00–0.49)

## 2023-10-20 ENCOUNTER — Ambulatory Visit: Payer: Self-pay | Admitting: Student

## 2023-10-20 DIAGNOSIS — R600 Localized edema: Secondary | ICD-10-CM

## 2023-10-20 NOTE — Progress Notes (Signed)
 Spoke with patient and she would like to know what she should do to resolve this. She has to go out of country tomorrow and she really needs this resolved. Please put lab order in to Urine protein test. She will go by lab shortly.   JM

## 2023-10-21 LAB — PROTEIN / CREATININE RATIO, URINE
Creatinine, Urine: 9.6 mg/dL
Protein, Ur: <4 mg/dL

## 2023-10-22 NOTE — Progress Notes (Signed)
 LMOM for patient to call back.  JM

## 2023-10-22 NOTE — Progress Notes (Signed)
 Dr. Lemon spoke with patient addressed concerns.  JM

## 2023-10-25 ENCOUNTER — Ambulatory Visit: Admitting: Licensed Clinical Social Worker

## 2023-11-01 ENCOUNTER — Encounter: Payer: Self-pay | Admitting: Student

## 2023-11-02 ENCOUNTER — Other Ambulatory Visit: Payer: Self-pay | Admitting: Psychiatry

## 2023-11-02 NOTE — Telephone Encounter (Signed)
 Could you contact her to make a follow up? Virtual is fine.

## 2023-11-03 NOTE — Telephone Encounter (Signed)
 Patient scheduled for 11-11-23 in person.

## 2023-11-05 NOTE — Progress Notes (Unsigned)
   REFERRING PHYSICIAN:  Joshua Cathryne BROCKS, Md No address on file  DOS: 04/16/23  placement of Nevro SCS  DOS: 11/09/22 XLIF L4-5 XLIF and posterior spinal fusion, right L5-S1 far lateral foraminotomy    DOS: 08/07/2022 (R L4/5 far lateral discectomy)  HISTORY OF PRESENT ILLNESS:  She has been seeing [facility]. She has seen Dr. Marcelino for her SI joint pain. She has also seen Dr. Sharrie for her left hip pain- she has known bilateral hip OA.   She has left hip injection by Dr. Sharrie on 09/28/23.   She had bilateral SI joint injections with Dr. Marcelino on 08/04/23.   She is seeing [facility]- she is on buprenorphine, cymbalta , and neurontin .   She is here for follow up.   She has good days and bad days. Overall she feels better!  She has noticed increased numbness and tingling in her right leg and foot. Her severe back pain is improved. Pain is worse with prolonged positions. She still has pain in her buttocks, SI joints, and into her upper posterior thigh. She has some burning at her SCS incision and at her battery site.    PHYSICAL EXAMINATION:  General: Patient is well developed, well nourished, calm, collected, and in no apparent distress.   NEUROLOGICAL:  General: In no acute distress.   Awake, alert, oriented to person, place, and time.  Pupils equal round and reactive to light.  Facial tone is symmetric.     Strength:          Side Iliopsoas Quads Hamstring PF DF EHL  R 4 5 5 5 5 5   L 5 5 5 5 5 5    Incisions well healed  Sensation intact to light touch in bilateral lower extremities, but is diminished in right leg.  She ambulates with a walker.    ROS (Neurologic):  Negative except as noted above  IMAGING: Lumbar xrays dated 11/11/23:  No complications noted.  Report for above xrays not yet available. Above xrays reviewed with Dr. Clois.   ASSESSMENT/PLAN:  Margaret Cuevas is overall doing better since her last visit. She  has good days and bad days.   Severe LBP is improved. She still has pain in her hips and SI joints. She is doing well on new medications from pain management.   Treatment options reviewed with patient and following plan made:   - Continue to follow with pain management and ortho.  - Activity as tolerated from our standpoint.  - She will follow up prn.   I spent a total of 15 minutes in face-to-face and non-face-to-face activities related to this patient's care today including review of outside records, review of imaging, review of symptoms, physical exam, discussion of differential diagnosis, discussion of treatment options, and documentation.   Glade Boys PA-C Department of neurosurgery
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
## 2023-11-09 DIAGNOSIS — E43 Unspecified severe protein-calorie malnutrition: Secondary | ICD-10-CM | POA: Diagnosis not present

## 2023-11-09 DIAGNOSIS — Z6836 Body mass index (BMI) 36.0-36.9, adult: Secondary | ICD-10-CM | POA: Diagnosis not present

## 2023-11-09 DIAGNOSIS — R600 Localized edema: Secondary | ICD-10-CM | POA: Diagnosis not present

## 2023-11-09 DIAGNOSIS — Z9884 Bariatric surgery status: Secondary | ICD-10-CM | POA: Diagnosis not present

## 2023-11-09 DIAGNOSIS — K912 Postsurgical malabsorption, not elsewhere classified: Secondary | ICD-10-CM | POA: Diagnosis not present

## 2023-11-11 ENCOUNTER — Ambulatory Visit: Admitting: Psychiatry

## 2023-11-11 ENCOUNTER — Ambulatory Visit
Admission: RE | Admit: 2023-11-11 | Discharge: 2023-11-11 | Disposition: A | Discharge status: Home / Self Care | Source: Ambulatory Visit | Attending: Orthopedic Surgery | Admitting: Orthopedic Surgery

## 2023-11-11 ENCOUNTER — Encounter: Payer: Self-pay | Admitting: Psychiatry

## 2023-11-11 ENCOUNTER — Encounter: Payer: Self-pay | Admitting: Orthopedic Surgery

## 2023-11-11 ENCOUNTER — Ambulatory Visit: Payer: Federal, State, Local not specified - PPO | Admitting: Orthopedic Surgery

## 2023-11-11 ENCOUNTER — Other Ambulatory Visit: Payer: Self-pay

## 2023-11-11 ENCOUNTER — Ambulatory Visit
Admission: RE | Admit: 2023-11-11 | Discharge: 2023-11-11 | Disposition: A | Discharge status: Home / Self Care | Attending: Orthopedic Surgery | Admitting: Orthopedic Surgery

## 2023-11-11 VITALS — BP 130/64 | Ht 65.0 in | Wt 206.1 lb

## 2023-11-11 VITALS — BP 103/72 | HR 85 | Temp 97.8°F | Ht 65.0 in | Wt 208.6 lb

## 2023-11-11 DIAGNOSIS — Z981 Arthrodesis status: Secondary | ICD-10-CM | POA: Diagnosis not present

## 2023-11-11 DIAGNOSIS — M5416 Radiculopathy, lumbar region: Secondary | ICD-10-CM | POA: Diagnosis not present

## 2023-11-11 DIAGNOSIS — Z4789 Encounter for other orthopedic aftercare: Secondary | ICD-10-CM | POA: Diagnosis not present

## 2023-11-11 DIAGNOSIS — F330 Major depressive disorder, recurrent, mild: Secondary | ICD-10-CM

## 2023-11-11 DIAGNOSIS — Z9689 Presence of other specified functional implants: Secondary | ICD-10-CM

## 2023-11-11 DIAGNOSIS — F419 Anxiety disorder, unspecified: Secondary | ICD-10-CM

## 2023-11-11 DIAGNOSIS — G894 Chronic pain syndrome: Secondary | ICD-10-CM | POA: Diagnosis not present

## 2023-11-11 MED ORDER — DULOXETINE HCL 60 MG PO CPEP: 120.0000 mg | ORAL_CAPSULE | Freq: Every day | ORAL | 0 refills | Status: DC

## 2023-11-11 NOTE — Patient Instructions (Signed)
 Continue duloxetine  120 mg daily Continue BuSpar  7.5 mg twice a day Next appointment: 8/19 at 3 30

## 2023-11-15 DIAGNOSIS — G8921 Chronic pain due to trauma: Secondary | ICD-10-CM | POA: Diagnosis not present

## 2023-11-15 DIAGNOSIS — G894 Chronic pain syndrome: Secondary | ICD-10-CM | POA: Diagnosis not present

## 2023-11-15 DIAGNOSIS — M791 Myalgia, unspecified site: Secondary | ICD-10-CM | POA: Diagnosis not present

## 2023-11-15 DIAGNOSIS — M5117 Intervertebral disc disorders with radiculopathy, lumbosacral region: Secondary | ICD-10-CM | POA: Diagnosis not present

## 2023-11-19 DIAGNOSIS — E43 Unspecified severe protein-calorie malnutrition: Secondary | ICD-10-CM | POA: Diagnosis not present

## 2023-11-19 DIAGNOSIS — Z79899 Other long term (current) drug therapy: Secondary | ICD-10-CM | POA: Diagnosis not present

## 2023-11-19 DIAGNOSIS — R197 Diarrhea, unspecified: Secondary | ICD-10-CM | POA: Diagnosis not present

## 2023-11-19 DIAGNOSIS — Z87891 Personal history of nicotine dependence: Secondary | ICD-10-CM | POA: Diagnosis not present

## 2023-11-19 DIAGNOSIS — Z7182 Exercise counseling: Secondary | ICD-10-CM | POA: Diagnosis not present

## 2023-11-19 DIAGNOSIS — Z713 Dietary counseling and surveillance: Secondary | ICD-10-CM | POA: Diagnosis not present

## 2023-11-19 DIAGNOSIS — E561 Deficiency of vitamin K: Secondary | ICD-10-CM | POA: Diagnosis not present

## 2023-11-19 DIAGNOSIS — E509 Vitamin A deficiency, unspecified: Secondary | ICD-10-CM | POA: Diagnosis not present

## 2023-11-19 DIAGNOSIS — Z9884 Bariatric surgery status: Secondary | ICD-10-CM | POA: Diagnosis not present

## 2023-11-19 DIAGNOSIS — E440 Moderate protein-calorie malnutrition: Secondary | ICD-10-CM | POA: Diagnosis not present

## 2023-11-19 DIAGNOSIS — K912 Postsurgical malabsorption, not elsewhere classified: Secondary | ICD-10-CM | POA: Diagnosis not present

## 2023-11-19 DIAGNOSIS — Z48815 Encounter for surgical aftercare following surgery on the digestive system: Secondary | ICD-10-CM | POA: Diagnosis not present

## 2023-11-22 ENCOUNTER — Encounter: Payer: Self-pay | Admitting: Licensed Clinical Social Worker

## 2023-11-22 ENCOUNTER — Ambulatory Visit (INDEPENDENT_AMBULATORY_CARE_PROVIDER_SITE_OTHER): Admitting: Licensed Clinical Social Worker

## 2023-11-22 DIAGNOSIS — F330 Major depressive disorder, recurrent, mild: Secondary | ICD-10-CM | POA: Diagnosis not present

## 2023-11-22 DIAGNOSIS — F419 Anxiety disorder, unspecified: Secondary | ICD-10-CM | POA: Diagnosis not present

## 2023-11-22 NOTE — Progress Notes (Signed)
 THERAPIST PROGRESS NOTE  Virtual Visit via Video Note  I connected with Margaret Cuevas on 11/22/23 at 2:02pm by a video enabled telemedicine application and verified that I am speaking with the correct person using two identifiers.  Location: Patient: Address on file  Provider: [facility]   I discussed the limitations of evaluation and management by telemedicine and the availability of in person appointments. The patient expressed understanding and agreed to proceed.   I discussed the assessment and treatment plan with the patient. The patient was provided an opportunity to ask questions and all were answered. The patient agreed with the plan and demonstrated an understanding of the instructions.   The patient was advised to call back or seek an in-person evaluation if the symptoms worsen or if the condition fails to improve as anticipated.  I provided 59 minutes of non-face-to-face time during this encounter.   Margaret Cuevas Husband, LCSW   Session Time: 2:02pm-3:01pm  Participation Level: Active  Behavioral Response: CasualAlertEuthymic  Type of Therapy: Individual Therapy  Treatment Goals addressed:  Active     BH CCP Acute or Chronic Trauma Reaction     LTG: Elimination of maladaptive behaviors and thinking patterns which interfere with resolution of trauma as evidenced by self report     Start:  09/28/23    Expected End:  01/26/24         LTG: Develop and implement effective coping skills to carry out normal responsibilities and participate constructively in relationships as evidenced by self report     Start:  09/28/23    Expected End:  01/26/24         LTG: Recall traumatic events without becoming overwhelmed with negative emotions     Start:  09/28/23    Expected End:  01/26/24         STG: Margaret Cuevas will identify internal and external stimuli that trigger PTSD symptoms     Start:  09/28/23    Expected End:  01/26/24         Cooperate with trauma-focused  psychotherapy techniques to reduce emotional reaction to the traumatic event      Start:  09/28/23         Teach Margaret Cuevas coping strategies (e.g., writing down thoughts and feelings in a journal; taking deep, slow breaths; calling a support person to talk about memories) to deal with trauma memories and sudden emotional reactions without becoming emotionally n     Start:  09/28/23           OP Depression     LTG: Reduce frequency, intensity, and duration of depression symptoms so that daily functioning is improved     Start:  07/27/23    Expected End:  01/26/24         LTG: Increase coping skills to manage depression and improve ability to perform daily activities     Start:  07/27/23    Expected End:  01/26/24         STG: Margaret Cuevas will identify cognitive patterns and beliefs that support depression     Start:  07/27/23    Expected End:  01/26/24         STG: I have to figure out a way to not put so much pressure on myself to avoid feeling this sense of worthlessness.     Start:  07/27/23    Expected End:  01/26/24         Work with Margaret Cuevas to identify the major components of a  recent episode of depression: physical symptoms, major thoughts and images, and major behaviors they experienced     Start:  07/27/23         Therapist will educate patient on cognitive distortions and the rationale for treatment of depression     Start:  07/27/23         Margaret Cuevas will identify 2 cognitive distortions they are currently using and write reframing statements to replace them     Start:  07/27/23         Coping Skills     Start:  07/27/23       Will work with the pt using CBT/DBT techniques to help the pt verbalize an understanding of the cognitive, physiological, and behavioral components of depression and its treatment. This will be done by using worksheets, interactive activities, CBT/ABC thought logs, modeling, homework, role playing and journaling. Will work with pt to  learn and implement coping skills that result in a reduction of depression and improve daily functioning per pt self-report 3 out of 5 documented sessions.          ProgressTowards Goals: Progressing  Interventions: CBT, Solution Focused, Supportive, and Reframing  Summary:  Margaret Cuevas is a 48 y.o. female who presents with sxs of anxiety ad depression. Identifies sxs to include but not limited to Change in energy/activity; Difficulty Concentrating; Fatigue; Hopelessness; Increase/decrease in appetite; Irritability; Sleep (too much); Weight gain/loss; Worthlessness; Difficulty concentrating; Fatigue; Irritability; Restlessness; Sleep; Tension; Worrying. Pt was oriented times 5. Pt was cooperative and engaged. Pt denies SI/HI/AVH.     The patient took some time to reflect on her recent vacation, during which she confronted the physical limitations she faces. 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  In her reflections, she expressed deep-seated resentment towards her husband, articulating a sense of being misunderstood, which she attributes to her physical disabilities. This has led her to feel trapped in her marriage, as if she can't escape the emotional turbulence that comes with it.  Additionally, she expressed feelings of unworthiness and a lack of support from those around her. Communication barriers have exacerbated her feelings of isolation, making it difficult for her to express her needs and frustrations.  The constant impact of chronic pain, combined with fear has taken a toll on her physical and emotional well-being. In her heart, she grapples with a sense of loneliness and a yearning to feel loved and valued. Explored how their unresolved giref is impacting their relationship and ability to understand each other.  Suicidal/Homicidal: Nowithout intent/plan  Therapist Response: Clinician  utilized active and supportive reflection to create a safe space for patient to process recent life events. Assessed for safety, stressors, symptoms since last session. Patient reflected on heavy feelings she is experiencing within personal relationships with cln utilizing socratic questions to assess patients readiness to change/controllable factors. Cln assessed for self care and challenged patient to identify effort she can take to practice self care.   Plan: Return again in 2 weeks.  Diagnosis: MDD (major depressive disorder), recurrent episode, mild (HCC)  Anxiety disorder, unspecified type   Collaboration of Care: [facility] psychiatrist can access notes and cln. Will review psychiatrists' notes. Check in with the patient and will see LCSW per availability. Patient agreed with treatment recommendations.   Patient/Guardian was advised Release of Information must be obtained prior to any record release in order to collaborate their care with an outside provider.  Patient/Guardian was advised if they have not already done so to contact the registration department to sign all necessary forms in order for us  to release information regarding their care.   Consent: Patient/Guardian gives verbal consent for treatment and assignment of benefits for services provided during this visit. Patient/Guardian expressed understanding and agreed to proceed.   Margaret Cuevas Husband, LCSW 11/22/2023

## 2023-11-23 ENCOUNTER — Ambulatory Visit: Admitting: Student

## 2023-11-23 ENCOUNTER — Encounter: Payer: Self-pay | Admitting: Student

## 2023-11-23 VITALS — BP 112/70 | HR 88 | Ht 65.0 in | Wt 201.1 lb

## 2023-11-23 DIAGNOSIS — Z9884 Bariatric surgery status: Secondary | ICD-10-CM | POA: Diagnosis not present

## 2023-11-23 DIAGNOSIS — R300 Dysuria: Secondary | ICD-10-CM

## 2023-11-23 DIAGNOSIS — N951 Menopausal and female climacteric states: Secondary | ICD-10-CM | POA: Insufficient documentation

## 2023-11-23 DIAGNOSIS — G894 Chronic pain syndrome: Secondary | ICD-10-CM | POA: Diagnosis not present

## 2023-11-23 DIAGNOSIS — R600 Localized edema: Secondary | ICD-10-CM

## 2023-11-23 DIAGNOSIS — D509 Iron deficiency anemia, unspecified: Secondary | ICD-10-CM | POA: Insufficient documentation

## 2023-11-23 DIAGNOSIS — F339 Major depressive disorder, recurrent, unspecified: Secondary | ICD-10-CM

## 2023-11-23 DIAGNOSIS — D508 Other iron deficiency anemias: Secondary | ICD-10-CM | POA: Diagnosis not present

## 2023-11-23 DIAGNOSIS — K219 Gastro-esophageal reflux disease without esophagitis: Secondary | ICD-10-CM | POA: Insufficient documentation

## 2023-11-23 DIAGNOSIS — F411 Generalized anxiety disorder: Secondary | ICD-10-CM

## 2023-11-23 DIAGNOSIS — Z1231 Encounter for screening mammogram for malignant neoplasm of breast: Secondary | ICD-10-CM

## 2023-11-23 DIAGNOSIS — M797 Fibromyalgia: Secondary | ICD-10-CM

## 2023-11-23 NOTE — Assessment & Plan Note (Signed)
 Take omparzole as needed, has not had symptoms lately and is not taking omeprazole currently. Continue to monitor for symptoms.

## 2023-11-23 NOTE — Assessment & Plan Note (Signed)
 Curerntly on buspar 

## 2023-11-23 NOTE — Assessment & Plan Note (Signed)
 Oniron sulfate

## 2023-11-23 NOTE — Assessment & Plan Note (Addendum)
 Doing well on cymbalta  120mg  daily and fluoxetine

## 2023-11-23 NOTE — Assessment & Plan Note (Signed)
 Currently buprenorphine and gabapentin   and uloxecine [facility]

## 2023-11-23 NOTE — Progress Notes (Unsigned)
   Established Patient Office Visit  Subjective   Patient ID: Margaret Cuevas, female    DOB: 11-09-1975  Age: 48 y.o. MRN: [id number]  Chief Complaint  Patient presents with   Establish Care    Patient is here today to transition from Dr. 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   Spinal stenosis of lumbar region without neurogenic claudication 11/09/2022   Chronic radicular lumbar pain 08/07/2022   OSA on CPAP 05/07/2021   Anxiety, generalized 05/07/2021   H/O total hysterectomy 07/15/2020   History of appendectomy 07/15/2020   Lipoma of back 11/15/2018   Fibromyalgia 10/25/2017   Influenza vaccine needed 01/20/2017      ROS Refer to HPI    Objective:     BP 112/70   Pulse 88   Ht 5' 5 (1.651 m)   Wt 201 lb 2 oz (91.2 kg)   SpO2 98%   BMI 33.47 kg/m  BP Readings from Last 3 Encounters:  11/23/23 112/70  11/11/23 130/64  11/11/23 103/72    Physical Exam     11/11/2023    1:42 PM 10/18/2023    1:24 PM 07/27/2023    1:13 PM  Depression screen PHQ 2/9  Decreased Interest 1 2 3   Down, Depressed, Hopeless 1 1 2   PHQ - 2 Score 2 3 5   Altered sleeping 2 3 3   Tired, decreased energy 2 0 2  Change in appetite 2 2 3   Feeling bad or failure about yourself  2 3 1   Trouble concentrating 2 3 3   Moving slowly or fidgety/restless 1 2 0  Suicidal thoughts 1 1 1   PHQ-9 Score 14 17 18   Difficult doing work/chores Somewhat difficult Somewhat difficult Extremely dIfficult       11/11/2023    1:43  PM 10/18/2023    1:25 PM 02/26/2023    9:45 AM 01/25/2023   11:34 AM  GAD 7 : Generalized Anxiety Score  Nervous, Anxious, on Edge 2 1 1 2   Control/stop worrying 1 2 1 2   Worry too much - different things 1 2 1 2   Trouble relaxing 2 2 2 3   Restless 1 2 1 2   Easily annoyed or irritable 3 3 1 1   Afraid - awful might happen 0 3 1 1   Total GAD 7 Score 10 15 8 13   Anxiety Difficulty Somewhat difficult Somewhat difficult Somewhat difficult Very difficult    No results found for any visits on 11/23/23.  {Labs (Optional):23779}  The 10-year ASCVD risk score (Arnett DK, et al., 2019) is: 0.2%    Assessment & Plan:  There are no diagnoses linked to this encounter.   No follow-ups on file.    Harlene Saddler, MD

## 2023-11-23 NOTE — Assessment & Plan Note (Signed)
 Was on estrogen patch for sx prescribed by OBGYN, But mother who 36 recent dx with rare form of breast cancer, and has paused estrogen until she can follow up with GYN.

## 2023-11-23 NOTE — Patient Instructions (Signed)
 Please call to schedule your mammogram [phone]

## 2023-11-23 NOTE — Assessment & Plan Note (Signed)
 This has improved since last visit. Weight is trending down. 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 She is working with bariatric clinic regarding this. Has been elevating legs at night. Has not been wearing compression stockings. Discussed elevating legs when not standing and compression stocking in the evenings.

## 2023-11-23 NOTE — Assessment & Plan Note (Addendum)
 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 Multiple nutritional deficiencies including vitamin A, K , protein calories malnutrition, and iron, follow with bariatric clinic regarding this.

## 2023-11-24 DIAGNOSIS — R300 Dysuria: Secondary | ICD-10-CM | POA: Diagnosis not present

## 2023-11-24 DIAGNOSIS — F329 Major depressive disorder, single episode, unspecified: Secondary | ICD-10-CM | POA: Insufficient documentation

## 2023-11-24 NOTE — Assessment & Plan Note (Signed)
 One day of suprapubic pressure with urination and mild dysuria, no increase in frequency or urgency. Denies fever or flank pain. UA and culture ordered.

## 2023-11-24 NOTE — Assessment & Plan Note (Addendum)
 Sees [facility] for pain medication management.  Sees Dr. Lateef for SI joint injection and Dr. Sharrie for Hip OA. Current medications are buprenorphine 8 mg twice daily and gabapentin  1.2g three times daily.

## 2023-11-24 NOTE — Assessment & Plan Note (Signed)
 Managed by Dr. Hisada, current on duloxetine  and buspar .

## 2023-11-25 LAB — URINALYSIS, ROUTINE W REFLEX MICROSCOPIC
Bilirubin, UA: NEGATIVE
Glucose, UA: NEGATIVE
Leukocytes,UA: NEGATIVE
Nitrite, UA: NEGATIVE
Protein,UA: NEGATIVE
RBC, UA: NEGATIVE
Specific Gravity, UA: 1.023 (ref 1.005–1.030)
Urobilinogen, Ur: 0.2 mg/dL (ref 0.2–1.0)
pH, UA: 5.5 (ref 5.0–7.5)

## 2023-11-26 ENCOUNTER — Ambulatory Visit: Payer: Self-pay | Admitting: Student

## 2023-11-26 LAB — URINE CULTURE
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
## 2023-12-08 DIAGNOSIS — K289 Gastrojejunal ulcer, unspecified as acute or chronic, without hemorrhage or perforation: Secondary | ICD-10-CM | POA: Diagnosis not present

## 2023-12-08 DIAGNOSIS — K912 Postsurgical malabsorption, not elsewhere classified: Secondary | ICD-10-CM | POA: Diagnosis not present

## 2023-12-08 DIAGNOSIS — K8020 Calculus of gallbladder without cholecystitis without obstruction: Secondary | ICD-10-CM | POA: Diagnosis not present

## 2023-12-08 DIAGNOSIS — R197 Diarrhea, unspecified: Secondary | ICD-10-CM | POA: Diagnosis not present

## 2023-12-08 DIAGNOSIS — Z713 Dietary counseling and surveillance: Secondary | ICD-10-CM | POA: Diagnosis not present

## 2023-12-08 DIAGNOSIS — L659 Nonscarring hair loss, unspecified: Secondary | ICD-10-CM | POA: Diagnosis not present

## 2023-12-08 DIAGNOSIS — Z6833 Body mass index (BMI) 33.0-33.9, adult: Secondary | ICD-10-CM | POA: Diagnosis not present

## 2023-12-08 DIAGNOSIS — Z48815 Encounter for surgical aftercare following surgery on the digestive system: Secondary | ICD-10-CM | POA: Diagnosis not present

## 2023-12-08 DIAGNOSIS — Z9884 Bariatric surgery status: Secondary | ICD-10-CM | POA: Diagnosis not present

## 2023-12-08 DIAGNOSIS — K469 Unspecified abdominal hernia without obstruction or gangrene: Secondary | ICD-10-CM | POA: Diagnosis not present

## 2023-12-14 ENCOUNTER — Telehealth: Admitting: Psychiatry

## 2023-12-14 ENCOUNTER — Encounter: Payer: Self-pay | Admitting: Psychiatry

## 2023-12-14 DIAGNOSIS — F419 Anxiety disorder, unspecified: Secondary | ICD-10-CM

## 2023-12-14 DIAGNOSIS — F330 Major depressive disorder, recurrent, mild: Secondary | ICD-10-CM

## 2023-12-14 MED ORDER — BUSPIRONE HCL 7.5 MG PO TABS: 7.5000 mg | ORAL_TABLET | Freq: Two times a day (BID) | ORAL | 0 refills | Status: AC

## 2023-12-20 DIAGNOSIS — M5117 Intervertebral disc disorders with radiculopathy, lumbosacral region: Secondary | ICD-10-CM | POA: Diagnosis not present

## 2023-12-20 DIAGNOSIS — G8921 Chronic pain due to trauma: Secondary | ICD-10-CM | POA: Diagnosis not present

## 2023-12-20 DIAGNOSIS — M791 Myalgia, unspecified site: Secondary | ICD-10-CM | POA: Diagnosis not present

## 2023-12-20 DIAGNOSIS — G894 Chronic pain syndrome: Secondary | ICD-10-CM | POA: Diagnosis not present

## 2023-12-22 ENCOUNTER — Ambulatory Visit: Admitting: Licensed Clinical Social Worker

## 2023-12-22 DIAGNOSIS — F419 Anxiety disorder, unspecified: Secondary | ICD-10-CM

## 2023-12-22 DIAGNOSIS — F330 Major depressive disorder, recurrent, mild: Secondary | ICD-10-CM | POA: Diagnosis not present

## 2023-12-22 NOTE — Progress Notes (Signed)
 THERAPIST PROGRESS NOTE  Virtual Visit via Video Note  I connected with Margaret Cuevas on 12/22/23 at 10:00 AM EDT by a video enabled telemedicine application and verified that I am speaking with the correct person using two identifiers.  Location: Patient: Address on file  Provider: [facility]   I discussed the limitations of evaluation and management by telemedicine and the availability of in person appointments. The patient expressed understanding and agreed to proceed.   I discussed the assessment and treatment plan with the patient. The patient was provided an opportunity to ask questions and all were answered. The patient agreed with the plan and demonstrated an understanding of the instructions.   The patient was advised to call back or seek an in-person evaluation if the symptoms worsen or if the condition fails to improve as anticipated.  I provided 58 minutes of non-face-to-face time during this encounter.   Margaret Cuevas Husband, LCSW   Session Time: 10-10:58am  Participation Level: Active  Behavioral Response: CasualAlertEuthymic  Type of Therapy: Individual Therapy  Treatment Goals addressed:  Active     BH CCP Acute or Chronic Trauma Reaction     LTG: Elimination of maladaptive behaviors and thinking patterns which interfere with resolution of trauma as evidenced by self report     Start:  09/28/23    Expected End:  01/26/24         LTG: Develop and implement effective coping skills to carry out normal responsibilities and participate constructively in relationships as evidenced by self report     Start:  09/28/23    Expected End:  01/26/24         LTG: Recall traumatic events without becoming overwhelmed with negative emotions     Start:  09/28/23    Expected End:  01/26/24         STG: Margaret will identify internal and external stimuli that trigger PTSD symptoms     Start:  09/28/23    Expected End:  01/26/24         Cooperate with trauma-focused  psychotherapy techniques to reduce emotional reaction to the traumatic event      Start:  09/28/23         Teach Margaret coping strategies (e.g., writing down thoughts and feelings in a journal; taking deep, slow breaths; calling a support person to talk about memories) to deal with trauma memories and sudden emotional reactions without becoming emotionally n     Start:  09/28/23           OP Depression     LTG: Reduce frequency, intensity, and duration of depression symptoms so that daily functioning is improved     Start:  07/27/23    Expected End:  01/26/24         LTG: Increase coping skills to manage depression and improve ability to perform daily activities     Start:  07/27/23    Expected End:  01/26/24         STG: Errica will identify cognitive patterns and beliefs that support depression     Start:  07/27/23    Expected End:  01/26/24         STG: I have to figure out a way to not put so much pressure on myself to avoid feeling this sense of worthlessness.     Start:  07/27/23    Expected End:  01/26/24         Work with Margaret to identify the major components  of a recent episode of depression: physical symptoms, major thoughts and images, and major behaviors they experienced     Start:  07/27/23         Therapist will educate patient on cognitive distortions and the rationale for treatment of depression     Start:  07/27/23         Lamija will identify 2 cognitive distortions they are currently using and write reframing statements to replace them     Start:  07/27/23         Coping Skills     Start:  07/27/23       Will work with the pt using CBT/DBT techniques to help the pt verbalize an understanding of the cognitive, physiological, and behavioral components of depression and its treatment. This will be done by using worksheets, interactive activities, CBT/ABC thought logs, modeling, homework, role playing and journaling. Will work with pt to  learn and implement coping skills that result in a reduction of depression and improve daily functioning per pt self-report 3 out of 5 documented sessions.         ProgressTowards Goals: Progressing  Interventions: Conservator, museum/gallery, Supportive, and Reframing  Summary: Margaret Cuevas is a 48 y.o. female who presents with sxs of anxiety ad depression. Identifies sxs to include but not limited to Change in energy/activity; Difficulty Concentrating; Fatigue; Hopelessness; Increase/decrease in appetite; Irritability; Sleep (too much); Weight gain/loss; Worthlessness; Difficulty concentrating; Fatigue; Irritability; Restlessness; Sleep; Tension; Worrying. Pt was oriented times 5. Pt was cooperative and engaged. Pt denies SI/HI/AVH.   The patient reflected on the adjustments affecting her family dynamics, particularly the challenges arising from differing parenting strategies for their children at home. She expressed concerns over the financial stressors they faced, which added pressure to their daily lives. In her quest to regain a sense of control, she identified the importance of having an open conversation with her husband about these stressors, especially regarding their financial responsibilities.  Additionally, she recognized the toll that physical health complications were taking on her mental well-being. Margaret Cuevas often felt the weight of her struggles, believing she needed to cope alone due to a fear of being judged by others. This isolation made her challenges feel even more daunting as she tried to navigate her circumstances without support.  Identified a plan to write out the points she wants to address with her husband. Worked with the patient on challneging her anxious thoughts regarding the worst case scenario.   Suicidal/Homicidal: Nowithout intent/plan  Therapist Response: Clinician utilized active and supportive reflection to create a safe space for patient to process recent life  events. Assessed for safety, stressors, symptoms since last session. Patient reflected on heavy feelings she is experiencing within personal relationships with cln utilizing socratic questions to assess patients readiness to change/controllable factors.  Worked with patient to reframe negative cognitions related to her fears of the worst case scenario.  Plan: Return again in 4 weeks.  Diagnosis: MDD (major depressive disorder), recurrent episode, mild (HCC)  Anxiety disorder, unspecified type   Collaboration of Care: AEB psychiatrist can access notes and cln. Will review psychiatrists' notes. Check in with the patient and will see LCSW per availability. Patient agreed with treatment recommendations.   Patient/Guardian was advised Release of Information must be obtained prior to any record release in order to collaborate their care with an outside provider. Patient/Guardian was advised if they have not already done so to contact the registration department to sign all necessary forms in order for  us  to release information regarding their care.   Consent: Patient/Guardian gives verbal consent for treatment and assignment of benefits for services provided during this visit. Patient/Guardian expressed understanding and agreed to proceed.   Margaret Cuevas Husband, LCSW 12/22/2023

## 2023-12-24 DIAGNOSIS — E66811 Obesity, class 1: Secondary | ICD-10-CM | POA: Diagnosis not present

## 2023-12-24 DIAGNOSIS — E43 Unspecified severe protein-calorie malnutrition: Secondary | ICD-10-CM | POA: Diagnosis not present

## 2023-12-24 DIAGNOSIS — Z9884 Bariatric surgery status: Secondary | ICD-10-CM | POA: Diagnosis not present

## 2023-12-24 DIAGNOSIS — K912 Postsurgical malabsorption, not elsewhere classified: Secondary | ICD-10-CM | POA: Diagnosis not present

## 2023-12-24 DIAGNOSIS — Z713 Dietary counseling and surveillance: Secondary | ICD-10-CM | POA: Diagnosis not present

## 2024-01-04 ENCOUNTER — Encounter: Payer: Self-pay | Admitting: Student

## 2024-01-04 NOTE — Telephone Encounter (Signed)
 Please review.  KP

## 2024-01-05 ENCOUNTER — Other Ambulatory Visit: Payer: Self-pay

## 2024-01-05 DIAGNOSIS — N644 Mastodynia: Secondary | ICD-10-CM

## 2024-01-05 NOTE — Telephone Encounter (Signed)
 Please review.  KP

## 2024-01-06 ENCOUNTER — Ambulatory Visit: Admitting: Licensed Clinical Social Worker

## 2024-01-06 DIAGNOSIS — F330 Major depressive disorder, recurrent, mild: Secondary | ICD-10-CM | POA: Diagnosis not present

## 2024-01-06 DIAGNOSIS — F419 Anxiety disorder, unspecified: Secondary | ICD-10-CM

## 2024-01-06 NOTE — Progress Notes (Signed)
 THERAPIST PROGRESS NOTE  Virtual Visit via Video Note  I connected with Margaret Cuevas on 01/06/24 at 11:00 AM EDT by a video enabled telemedicine application and verified that I am speaking with the correct person using two identifiers.  Location: Patient: Address on file  Provider: [facility]   I discussed the limitations of evaluation and management by telemedicine and the availability of in person appointments. The patient expressed understanding and agreed to proceed.   I discussed the assessment and treatment plan with the patient. The patient was provided an opportunity to ask questions and all were answered. The patient agreed with the plan and demonstrated an understanding of the instructions.   The patient was advised to call back or seek an in-person evaluation if the symptoms worsen or if the condition fails to improve as anticipated.  I provided 59 minutes of non-face-to-face time during this encounter.   Margaret Cuevas Husband, LCSW   Session Time: 11-11:59am  Participation Level: Active  Behavioral Response: CasualAlertDepressed  Type of Therapy: Individual Therapy  Treatment Goals addressed:  Active     BH CCP Acute or Chronic Trauma Reaction     LTG: Elimination of maladaptive behaviors and thinking patterns which interfere with resolution of trauma as evidenced by self report     Start:  09/28/23    Expected End:  01/26/24         LTG: Develop and implement effective coping skills to carry out normal responsibilities and participate constructively in relationships as evidenced by self report     Start:  09/28/23    Expected End:  01/26/24         LTG: Recall traumatic events without becoming overwhelmed with negative emotions     Start:  09/28/23    Expected End:  01/26/24         STG: Margaret will identify internal and external stimuli that trigger PTSD symptoms     Start:  09/28/23    Expected End:  01/26/24         Cooperate with trauma-focused  psychotherapy techniques to reduce emotional reaction to the traumatic event      Start:  09/28/23         Teach Margaret coping strategies (e.g., writing down thoughts and feelings in a journal; taking deep, slow breaths; calling a support person to talk about memories) to deal with trauma memories and sudden emotional reactions without becoming emotionally n     Start:  09/28/23           OP Depression     LTG: Reduce frequency, intensity, and duration of depression symptoms so that daily functioning is improved     Start:  07/27/23    Expected End:  01/26/24         LTG: Increase coping skills to manage depression and improve ability to perform daily activities     Start:  07/27/23    Expected End:  01/26/24         STG: Margaret Cuevas will identify cognitive patterns and beliefs that support depression     Start:  07/27/23    Expected End:  01/26/24         STG: I have to figure out a way to not put so much pressure on myself to avoid feeling this sense of worthlessness.     Start:  07/27/23    Expected End:  01/26/24         Work with Margaret to identify the major components  of a recent episode of depression: physical symptoms, major thoughts and images, and major behaviors they experienced     Start:  07/27/23         Therapist will educate patient on cognitive distortions and the rationale for treatment of depression     Start:  07/27/23         Margaret Cuevas will identify 2 cognitive distortions they are currently using and write reframing statements to replace them     Start:  07/27/23         Coping Skills     Start:  07/27/23       Will work with the pt using CBT/DBT techniques to help the pt verbalize an understanding of the cognitive, physiological, and behavioral components of depression and its treatment. This will be done by using worksheets, interactive activities, CBT/ABC thought logs, modeling, homework, role playing and journaling. Will work with pt to  learn and implement coping skills that result in a reduction of depression and improve daily functioning per pt self-report 3 out of 5 documented sessions.          ProgressTowards Goals: Progressing  Interventions: Conservator, museum/gallery, Psychosocial Skills: Communication, and Supportive  Summary: Margaret Cuevas is a 48 y.o. female who presents with sxs of anxiety ad depression. Identifies sxs to include but not limited to Change in energy/activity; Difficulty Concentrating; Fatigue; Hopelessness; Increase/decrease in appetite; Irritability; Sleep (too much); Weight gain/loss; Worthlessness; Difficulty concentrating; Fatigue; Irritability; Restlessness; Sleep; Tension; Worrying. Pt was oriented times 5. Pt was cooperative and engaged. Pt denies SI/HI/AVH.   Reports from a year ago indicated that she wanted to give up due to her medical complications. However, she states that she is no longer in that mental place. She shares that on most days, her medication helps her manage her depression and anxiety.  The patient was in a great deal of physical pain during the session.  She reflected on the role her church has played in supporting her. She mentioned that she joined the choir at her church, which serves as a positive distraction. She noted that this activity is something she and her Cuevas can relate to, as he plays the drums.  She reports an increased sense of hope and reflected on the use of positive affirmations and praise in her marriage to encourage healthy communication. She expressed low self-esteem when comparing herself to her previous roles and level of independence before her disability. We explored ways she and her Cuevas can communicate their needs through active listening or love languages, especially when she feels especially vulnerable.  During the assessment for self-care, the patient reported that she is being as active as possible and is mindful of her  nutrition.  Suicidal/Homicidal: Nowithout intent/plan  Therapist Response: Clinician utilized active and supportive reflection to create a safe space for patient to process recent life events. Assessed for safety, stressors, symptoms since last session.  Reflected on ways in which patient can utilize different communication techniques to ensure healthier connection between her and her Cuevas.  Reflected on how patient's physical disability is impacted her self-esteem and ways in which she can get back to finding her identity.  Reviewed self-care.  Plan: Return again in 2 weeks.  Diagnosis: MDD (major depressive disorder), recurrent episode, mild (HCC)  Anxiety disorder, unspecified type   Collaboration of Care: AEB psychiatrist can access notes and cln. Will review psychiatrists' notes. Check in with the patient and will see LCSW per availability. Patient agreed with treatment recommendations.  Patient/Guardian was advised Release of Information must be obtained prior to any record release in order to collaborate their care with an outside provider. Patient/Guardian was advised if they have not already done so to contact the registration department to sign all necessary forms in order for us  to release information regarding their care.   Consent: Patient/Guardian gives verbal consent for treatment and assignment of benefits for services provided during this visit. Patient/Guardian expressed understanding and agreed to proceed.   Margaret Cuevas Husband, LCSW 01/06/2024

## 2024-01-13 ENCOUNTER — Inpatient Hospital Stay
Admission: RE | Admit: 2024-01-13 | Discharge: 2024-01-13 | Discharge status: Home / Self Care | Source: Ambulatory Visit | Attending: Student

## 2024-01-13 ENCOUNTER — Ambulatory Visit
Admission: RE | Admit: 2024-01-13 | Discharge: 2024-01-13 | Disposition: A | Discharge status: Home / Self Care | Source: Ambulatory Visit | Attending: Student | Admitting: Student

## 2024-01-13 DIAGNOSIS — N644 Mastodynia: Secondary | ICD-10-CM

## 2024-01-13 DIAGNOSIS — R92323 Mammographic fibroglandular density, bilateral breasts: Secondary | ICD-10-CM | POA: Diagnosis not present

## 2024-01-19 ENCOUNTER — Ambulatory Visit: Admitting: Licensed Clinical Social Worker

## 2024-01-20 ENCOUNTER — Emergency Department
Admission: EM | Admit: 2024-01-20 | Discharge: 2024-01-20 | Discharge status: Against Medical Advice | Attending: Emergency Medicine | Admitting: Emergency Medicine

## 2024-01-20 ENCOUNTER — Other Ambulatory Visit: Payer: Self-pay

## 2024-01-20 DIAGNOSIS — Z5321 Procedure and treatment not carried out due to patient leaving prior to being seen by health care provider: Secondary | ICD-10-CM | POA: Insufficient documentation

## 2024-01-20 DIAGNOSIS — M79606 Pain in leg, unspecified: Secondary | ICD-10-CM | POA: Diagnosis not present

## 2024-01-20 DIAGNOSIS — R609 Edema, unspecified: Secondary | ICD-10-CM | POA: Diagnosis not present

## 2024-01-20 LAB — CBC WITH DIFFERENTIAL/PLATELET
Abs Immature Granulocytes: 0.01 K/uL (ref 0.00–0.07)
Basophils Absolute: 0 K/uL (ref 0.0–0.1)
Basophils Relative: 1 %
Eosinophils Absolute: 0.2 K/uL (ref 0.0–0.5)
Eosinophils Relative: 3 %
HCT: 31.2 % — ABNORMAL LOW (ref 36.0–46.0)
Hemoglobin: 10.1 g/dL — ABNORMAL LOW (ref 12.0–15.0)
Immature Granulocytes: 0 %
Lymphocytes Relative: 41 %
Lymphs Abs: 2.6 K/uL (ref 0.7–4.0)
MCH: 32.9 pg (ref 26.0–34.0)
MCHC: 32.4 g/dL (ref 30.0–36.0)
MCV: 101.6 fL — ABNORMAL HIGH (ref 80.0–100.0)
Monocytes Absolute: 0.4 K/uL (ref 0.1–1.0)
Monocytes Relative: 6 %
Neutro Abs: 3.2 K/uL (ref 1.7–7.7)
Neutrophils Relative %: 49 %
Platelets: 187 K/uL (ref 150–400)
RBC: 3.07 MIL/uL — ABNORMAL LOW (ref 3.87–5.11)
RDW: 13.3 % (ref 11.5–15.5)
WBC: 6.3 K/uL (ref 4.0–10.5)
nRBC: 0 % (ref 0.0–0.2)

## 2024-01-20 LAB — BASIC METABOLIC PANEL WITH GFR
Anion gap: 9 (ref 5–15)
BUN: 14 mg/dL (ref 6–20)
CO2: 25 mmol/L (ref 22–32)
Calcium: 8.5 mg/dL — ABNORMAL LOW (ref 8.9–10.3)
Chloride: 107 mmol/L (ref 98–111)
Creatinine, Ser: 0.63 mg/dL (ref 0.44–1.00)
GFR, Estimated: >60 mL/min (ref >60)
Glucose, Bld: 90 mg/dL (ref 70–99)
Potassium: 4.6 mmol/L (ref 3.5–5.1)
Sodium: 141 mmol/L (ref 135–145)

## 2024-01-20 NOTE — ED Triage Notes (Signed)
 Pt reports aprox 2 hours ago pt developed severe leg cramps, pt has had hx of same. Pt has hx fibromyalgia. Pt took a muscle relaxer gabapentin  and tried mustard at home with no relief. Pt was given 500cc NS by EMS.

## 2024-01-21 ENCOUNTER — Telehealth: Payer: Self-pay

## 2024-01-21 NOTE — Transitions of Care (Post Inpatient/ED Visit) (Signed)
   01/21/2024  Name: Lashe Oliveira MRN: [id number] DOB: 1975/12/18  Today's TOC FU Call Status: Today's TOC FU Call Status:: Unsuccessful Call (1st Attempt) Unsuccessful Call (1st Attempt) Date: 01/21/24  Attempted to reach the patient regarding the most recent Inpatient/ED visit.  Follow Up Plan: Additional outreach attempts will be made to reach the patient to complete the Transitions of Care (Post Inpatient/ED visit) call.   Mozetta Murfin Camacho Ocampo, [facility]  [facility] Primary Care & Sports Medicine at [facility] [address] [place], [place] [address] Office: [phone] Fax: [phone]

## 2024-01-24 NOTE — Transitions of Care (Post Inpatient/ED Visit) (Signed)
 01/24/2024  Name: Margaret Cuevas MRN: [id number] DOB: Aug 15, 1975  Today's TOC FU Call Status: Today's TOC FU Call Status:: Successful TOC FU Call Completed Unsuccessful Call (1st Attempt) Date: 01/21/24 [facility] FU Call Complete Date: 01/24/24 Patient's Name and Date of Birth confirmed.  Transition Care Management Follow-up Telephone Call Date of Discharge: 01/20/24 Discharge Facility: [facility]) Type of Discharge: Emergency Department Reason for ED Visit: Other: (Leg pain) How have you been since you were released from the hospital?: Same Any questions or concerns?: No  Items Reviewed: Medications obtained,verified, and reconciled?: Yes (Medications Reviewed) Any new allergies since your discharge?: No Dietary orders reviewed?: NA Do you have support at home?: Yes People in Home [RPT]: spouse Name of Support/Comfort Primary Source: Cara Irving  Medications Reviewed Today: Medications Reviewed Today     Reviewed by Camacho Ocampo, Hearl Heikes M, CMA (Certified Medical Assistant) on 01/24/24 at 1431  Med List Status: <None>   Medication Order Taking? Sig Documenting Provider Last Dose Status Informant  acetaminophen  (TYLENOL ) 650 MG CR tablet [id number] Yes Take 1,300 mg by mouth every 8 (eight) hours as needed for pain. [provider]  Active Self  buprenorphine (SUBUTEX) 8 MG SUBL SL tablet [id number] Yes in the morning and at bedtime. [provider]  Active   busPIRone  (BUSPAR ) 7.5 MG tablet [id number] Yes Take 1 tablet (7.5 mg total) by mouth 2 (two) times daily. Hisada, Reina, MD  Active   cyclobenzaprine  (FLEXERIL ) 10 MG tablet [id number] Yes TAKE 1 TABLET(10 MG) BY MOUTH THREE TIMES DAILY AS NEEDED FOR MUSCLE SPASMS Gregory Edsel Ruth, PA  Active Self  DULoxetine  (CYMBALTA ) 60 MG capsule [id number] Yes Take 2 capsules (120 mg total) by mouth daily. Hisada, Reina, MD  Active   estradiol  (VIVELLE -DOT) 0.1 MG/24HR patch [id number]  Place 1 patch  (0.1 mg total) onto the skin 2 (two) times a week. Dr Lovetta  Patient not taking: Reported on 01/24/2024   Schermerhorn, Debby PARAS, MD  Active Self  gabapentin  (NEURONTIN ) 600 MG tablet [id number] Yes Take 2 tablets (1,200 mg total) by mouth 3 (three) times daily. Hilma Hastings, PA-C  Active   Multiple Vitamins-Minerals (CITRACAL +D3 PO) [id number] Yes Take 1,200 mg by mouth 2 (two) times daily. [provider]  Active Self  omeprazole (PRILOSEC) 40 MG capsule [id number] Yes Take 40 mg by mouth daily. [provider]  Active Self  scopolamine (TRANSDERM-SCOP) 1 MG/3DAYS [id number] Yes Place 1 patch onto the skin every 3 (three) days. As needed  Patient taking differently: Place 1 patch onto the skin as needed. As needed   [provider]  Active             Home Care and Equipment/Supplies: Were Home Health Services Ordered?: NA Any new equipment or medical supplies ordered?: NA  Functional Questionnaire: Do you need assistance with bathing/showering or dressing?: No Do you need assistance with meal preparation?: No Do you need assistance with eating?: No Do you have difficulty maintaining continence: No Do you need assistance with getting out of bed/getting out of a chair/moving?: No Do you have difficulty managing or taking your medications?: No  Follow up appointments reviewed: PCP Follow-up appointment confirmed?: No (Patient declined appointment, she will follow up with her pain management provider, will call if needed) Specialist Hospital Follow-up appointment confirmed?: No Do you need transportation to your follow-up appointment?: No Do you understand care options if your condition(s) worsen?: Yes-patient verbalized understanding   Shabnam Ladd Camacho Ocampo, CMA  [facility] Primary Care & Sports Medicine at [facility] [address] [place], [place] [address] Office: [phone] Fax: [phone]

## 2024-01-31 ENCOUNTER — Ambulatory Visit: Admitting: Licensed Clinical Social Worker
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
## 2024-02-03 ENCOUNTER — Ambulatory Visit: Admitting: Licensed Clinical Social Worker

## 2024-02-03 DIAGNOSIS — F419 Anxiety disorder, unspecified: Secondary | ICD-10-CM

## 2024-02-03 DIAGNOSIS — F330 Major depressive disorder, recurrent, mild: Secondary | ICD-10-CM

## 2024-02-03 NOTE — Progress Notes (Signed)
 THERAPIST PROGRESS NOTE  Progress Review   Virtual Visit via Video Note  I connected with Margaret Cuevas Cuevas on 02/03/24 at  8:00 AM EDT by a video enabled telemedicine application and verified that I am speaking with the correct person using two identifiers.  Location: Patient: Address on file Provider: [facility]   I discussed the limitations of evaluation and management by telemedicine and the availability of in person appointments. The patient expressed understanding and agreed to proceed.   I discussed the assessment and treatment plan with the patient. The patient was provided an opportunity to ask questions and all were answered. The patient agreed with the plan and demonstrated an understanding of the instructions.   The patient was advised to call back or seek an in-person evaluation if the symptoms worsen or if the condition fails to improve as anticipated.  I provided 62 minutes of non-face-to-face time during this encounter.   Margaret Cuevas Cuevas Husband, LCSW   Session Time: 8-9:02am  Participation Level: Active  Behavioral Response: CasualAlertEuthymic  Type of Therapy: Individual Therapy  Treatment Goals addressed:  Active     BH CCP Acute or Chronic Trauma Reaction     LTG: Elimination of maladaptive behaviors and thinking patterns which interfere with resolution of trauma as evidenced by self report (Not Progressing)     Start:  09/28/23    Expected End:  05/05/24         LTG: Develop and implement effective coping skills to carry out normal responsibilities and participate constructively in relationships as evidenced by self report (Not Progressing)     Start:  09/28/23    Expected End:  05/05/24         LTG: Recall traumatic events without becoming overwhelmed with negative emotions (Not Progressing)     Start:  09/28/23    Expected End:  05/05/24         STG: Margaret Cuevas will identify internal and external stimuli that trigger PTSD symptoms (Not Progressing)      Start:  09/28/23    Expected End:  05/05/24         [name]      Start:  09/28/23         Teach Margaret Cuevas coping strategies (e.g., writing down thoughts and feelings in a journal; taking deep, slow breaths; calling a support person to talk about memories) to deal with trauma memories and sudden emotional reactions without becoming emotionally n     Start:  09/28/23           OP Depression     LTG: Reduce frequency, intensity, and duration of depression symptoms so that daily functioning is improved (Progressing)     Start:  07/27/23    Expected End:  05/05/24       Goal Note     02/03/24: Patient reports she is making improvements with her depressive sxs AEB by her PHQ9 scores.          LTG: Increase coping skills to manage depression and improve ability to perform daily activities (Progressing)     Start:  07/27/23    Expected End:  05/05/24         STG: Evan will identify cognitive patterns and beliefs that support depression (Progressing)     Start:  07/27/23    Expected End:  05/05/24       Goal Note     02/03/24: Reports she has been  able to catch and reframe negative thoughts.          STG: I have to figure out a way to not put so much pressure on myself to avoid feeling this sense of worthlessness. (Not Progressing)     Start:  07/27/23    Expected End:  05/05/24       Goal Note     02/03/24: Patient continues to report she is hard on herself for not completing tasks for the days. Cln encouraged the patient to focus on what she has accomplished for the day.           Work with Margaret Cuevas to identify the major components of a recent episode of depression: physical symptoms, major thoughts and images, and major behaviors they experienced     Start:  07/27/23         Therapist will educate patient on cognitive distortions and the rationale for treatment of  depression     Start:  07/27/23         Chery will identify 2 cognitive distortions they are currently using and write reframing statements to replace them     Start:  07/27/23         Coping Skills     Start:  07/27/23       Will work with the pt using CBT/DBT techniques to help the pt verbalize an understanding of the cognitive, physiological, and behavioral components of depression and its treatment. This will be done by using worksheets, interactive activities, CBT/ABC thought logs, modeling, homework, role playing and journaling. Will work with pt to learn and implement coping skills that result in a reduction of depression and improve daily functioning per pt self-report 3 out of 5 documented sessions.          ProgressTowards Goals: Progressing  Interventions: Strength-based and Supportive, CBT reframing  Summary: Margaret Cuevas Cuevas is a 48 y.o. female who presents with sxs of anxiety ad depression. Identifies sxs to include but not limited to Change in energy/activity; Difficulty Concentrating; Fatigue; Hopelessness; Increase/decrease in appetite; Irritability; Sleep (too much); Weight gain/loss; Worthlessness; Difficulty concentrating; Fatigue; Irritability; Restlessness; Sleep; Tension; Worrying. Pt was oriented times 5. Pt was cooperative and engaged. Pt denies SI/HI/AVH.   Cln utilized the session to review patients progress. See progress notes documented above.   The clinician readministered the PHQ-9 assessments, and the patient's depression scores improved from 18 to 10. GAD7 scored decreased from 13 to 11. Reports improved control over her worrying. She shared that she continues to make social connections through her church and takes pride in incorporating physical activity into her daily routine. However, she is mindful that trusting people remains difficult for her. The patient is finding purpose through mission work and is also starting a new program.  On February 02, 2024, she experienced an anxiety attack on her way home, triggered by the fear of falling asleep due to fatigue. She feels limited in what she can do because of the physical, mental, and emotional toll of her disabilities. The patient reported that her chronic pain has worsened compared to the past, which is frustrating for her as it impacts her independence. She remarked that she is using her walker more often lately.  The patient reports some improvement in suicidal ideation, stating she has thought, I just don't know how much more I can take, but denies any plan or intent. She is actively working to reframe her negative thoughts about her physical abilities and practices gratitude.  Since April 2026, she has been driving to see her son. However, she continues to experience anxiety related to her ability to remain fully aware due to panic attacks, muscle spasms, and fatigue.  Suicidal/Homicidal: Nowithout intent/plan  Therapist Response: Clinician utilized active and supportive reflection to create a safe space for patient to process recent life events. Assessed for safety, stressors, symptoms since last session.  Clinician readministered PHQ-9 and GAD-7 screeners to assess for symptom progress.  Updated treatment plan based on patient's reported progress.  Reflected on ways in which patient can reframe negative cognitions about her abilities following complications with physical conditions.  Plan: Return again in 2 weeks.  Diagnosis: MDD (major depressive disorder), recurrent episode, mild  Anxiety disorder, unspecified type   Collaboration of Care: AEB psychiatrist can access notes and cln. Will review psychiatrists' notes. Check in with the patient and will see LCSW per availability. Patient agreed with treatment recommendations.   Patient/Guardian was advised Release of Information must be obtained prior to any record release in order to collaborate their care with an outside provider.  Patient/Guardian was advised if they have not already done so to contact the registration department to sign all necessary forms in order for us  to release information regarding their care.   Consent: Patient/Guardian gives verbal consent for treatment and assignment of benefits for services provided during this visit. Patient/Guardian expressed understanding and agreed to proceed.   Margaret Cuevas Cuevas Husband, LCSW 02/03/2024

## 2024-02-07 DIAGNOSIS — M791 Myalgia, unspecified site: Secondary | ICD-10-CM | POA: Diagnosis not present

## 2024-02-07 DIAGNOSIS — M5117 Intervertebral disc disorders with radiculopathy, lumbosacral region: Secondary | ICD-10-CM | POA: Diagnosis not present

## 2024-02-07 DIAGNOSIS — G894 Chronic pain syndrome: Secondary | ICD-10-CM | POA: Diagnosis not present

## 2024-02-07 DIAGNOSIS — G8921 Chronic pain due to trauma: Secondary | ICD-10-CM | POA: Diagnosis not present

## 2024-02-08 ENCOUNTER — Ambulatory Visit: Admitting: Psychiatry

## 2024-02-08 ENCOUNTER — Other Ambulatory Visit: Payer: Self-pay

## 2024-02-08 ENCOUNTER — Encounter: Payer: Self-pay | Admitting: Psychiatry

## 2024-02-08 VITALS — BP 113/79 | HR 83 | Temp 97.3°F | Ht 65.0 in | Wt 200.4 lb

## 2024-02-08 DIAGNOSIS — F419 Anxiety disorder, unspecified: Secondary | ICD-10-CM | POA: Diagnosis not present

## 2024-02-08 DIAGNOSIS — F330 Major depressive disorder, recurrent, mild: Secondary | ICD-10-CM | POA: Diagnosis not present

## 2024-02-08 NOTE — Patient Instructions (Signed)
 Continue duloxetine  120 mg daily Continue BuSpar  7.5 mg twice a day Next appointment: 12/4 at 3 pm

## 2024-02-11 ENCOUNTER — Other Ambulatory Visit: Payer: Self-pay | Admitting: Student

## 2024-02-11 ENCOUNTER — Encounter: Payer: Self-pay | Admitting: Student

## 2024-02-11 ENCOUNTER — Ambulatory Visit: Admitting: Student

## 2024-02-11 VITALS — BP 112/78 | HR 87 | Ht 65.0 in | Wt 195.0 lb

## 2024-02-11 DIAGNOSIS — G4733 Obstructive sleep apnea (adult) (pediatric): Secondary | ICD-10-CM | POA: Diagnosis not present

## 2024-02-11 DIAGNOSIS — R600 Localized edema: Secondary | ICD-10-CM

## 2024-02-11 MED ORDER — FUROSEMIDE 20 MG PO TABS: 20.0000 mg | ORAL_TABLET | Freq: Every day | ORAL | 1 refills | Status: DC

## 2024-02-11 NOTE — Telephone Encounter (Signed)
 Please review patient's message. Would you like for her to come in for an office visit?

## 2024-02-11 NOTE — Assessment & Plan Note (Signed)
 Has not been using CPAP machine due to lack of snoring.

## 2024-02-14 NOTE — Telephone Encounter (Signed)
 Duplicate request.  Requested Prescriptions  Pending Prescriptions Disp Refills   furosemide (LASIX) 20 MG tablet [Pharmacy Med Name: FUROSEMIDE 20MG  TABLETS] 90 tablet     Sig: TAKE 1 TABLET(20 MG) BY MOUTH DAILY     Cardiovascular:  Diuretics - Loop Failed - 02/14/2024  4:18 PM      Failed - Ca in normal range and within 180 days    Calcium  Date Value Ref Range Status  01/20/2024 8.5 (L) 8.9 - 10.3 mg/dL Final         Failed - Mg Level in normal range and within 180 days    No results found for: MG       Passed - K in normal range and within 180 days    Potassium  Date Value Ref Range Status  01/20/2024 4.6 3.5 - 5.1 mmol/L Final         Passed - Na in normal range and within 180 days    Sodium  Date Value Ref Range Status  01/20/2024 141 135 - 145 mmol/L Final  10/18/2023 140 134 - 144 mmol/L Final         Passed - Cr in normal range and within 180 days    Creatinine, Ser  Date Value Ref Range Status  01/20/2024 0.63 0.44 - 1.00 mg/dL Final         Passed - Cl in normal range and within 180 days    Chloride  Date Value Ref Range Status  01/20/2024 107 98 - 111 mmol/L Final         Passed - Last BP in normal range    BP Readings from Last 1 Encounters:  02/11/24 112/78         Passed - Valid encounter within last 6 months    Recent Outpatient Visits           3 days ago OSA on CPAP   [facility], MD   2 months ago History of bariatric surgery   [facility] at [facility], MD   3 months ago Bilateral leg edema   [facility] at [facility], MD

## 2024-02-14 NOTE — Progress Notes (Signed)
 Established Patient Office Visit  Subjective   Patient ID: Margaret Cuevas, female    DOB: [birth year]  Age: 48 y.o. MRN: [id number]  Chief Complaint  Patient presents with   Leg Swelling   Edema    Margaret Cuevas with medical hx listed below presents today for evaluation of LE swelling. 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   Chronic pain syndrome 02/11/2023   S/P lumbar fusion 11/09/2022   Recurrent herniation of lumbar disc 11/09/2022   Spinal stenosis of lumbar region without neurogenic claudication 11/09/2022   Chronic radicular lumbar pain 08/07/2022   OSA on CPAP 05/07/2021   Anxiety, generalized 05/07/2021   H/O total hysterectomy 07/15/2020   History of appendectomy 07/15/2020   Fibromyalgia 10/25/2017      ROS Refer to HPI    Objective:     Outpatient Encounter Medications as of 02/11/2024  Medication Sig   acetaminophen  (TYLENOL ) 650 MG CR tablet Take 1,300 mg by mouth every 8 (eight) hours as needed for pain.   buprenorphine (SUBUTEX) 8 MG SUBL SL tablet in the morning and at bedtime.   busPIRone  (BUSPAR ) 7.5 MG tablet Take 1 tablet (7.5 mg total) by mouth 2 (two) times daily.   cyclobenzaprine  (FLEXERIL ) 10 MG tablet TAKE 1 TABLET(10 MG) BY MOUTH THREE TIMES DAILY AS NEEDED FOR MUSCLE SPASMS   DULoxetine  (CYMBALTA ) 60 MG capsule Take 2 capsules (120 mg total) by mouth daily.   furosemide (LASIX) 20 MG tablet Take 1 tablet (20 mg total) by mouth daily.   gabapentin  (NEURONTIN ) 600 MG tablet Take 2 tablets (1,200 mg total) by mouth  3 (three) times daily.   Multiple Vitamins-Minerals (CITRACAL +D3 PO) Take 1,200 mg by mouth 2 (two) times daily.   omeprazole (PRILOSEC) 40 MG capsule Take 40 mg by mouth daily.   scopolamine (TRANSDERM-SCOP) 1 MG/3DAYS Place 1 patch onto the skin every 3 (three) days. As needed (Patient taking differently: Place 1 patch onto the skin as needed. As needed)   estradiol  (VIVELLE -DOT) 0.1 MG/24HR patch Place 1 patch (0.1 mg total) onto the skin 2 (two) times a week. Dr Lovetta (Patient not taking: Reported on 02/11/2024)   No facility-administered encounter medications on file as of 02/11/2024.    BP 112/78   Pulse 87   Ht 5' 5 (1.651 m)   Wt 195 lb (88.5 kg)   SpO2 95%   BMI 32.45 kg/m  BP Readings from Last 3 Encounters:  02/11/24 112/78  01/20/24 (!) 136/90  11/23/23 112/70    Physical Exam     02/03/2024    8:21 AM 11/23/2023   10:31 AM 11/11/2023    1:42 PM  Depression screen PHQ 2/9  Decreased Interest  2   Down, Depressed, Hopeless  2   PHQ - 2 Score  4   Altered sleeping  2   Tired, decreased energy  3   Change in appetite  1   Feeling bad or failure about yourself   2   Trouble concentrating  2   Moving slowly or fidgety/restless  1  Suicidal thoughts  1   PHQ-9 Score  16   Difficult doing work/chores  Somewhat difficult      Information is confidential and restricted. Go to Review Flowsheets to unlock data.       02/03/2024    8:40 AM 11/23/2023   10:31 AM 11/11/2023    1:43 PM 10/18/2023    1:25 PM  GAD 7 : Generalized Anxiety Score  Nervous, Anxious, on Edge  2  1  Control/stop worrying  0  2  Worry too much - different things  2  2  Trouble relaxing  3  2  Restless  2  2  Easily annoyed or irritable  3  3  Afraid - awful might happen  1  3  Total GAD 7 Score  13  15  Anxiety Difficulty  Somewhat difficult  Somewhat difficult     Information is confidential and restricted. Go to Review Flowsheets to unlock data.    No results found for any  visits on 02/11/24.  Last CBC Lab Results  Component Value Date   WBC 6.3 01/20/2024   HGB 10.1 (L) 01/20/2024   HCT 31.2 (L) 01/20/2024   MCV 101.6 (H) 01/20/2024   MCH 32.9 01/20/2024   RDW 13.3 01/20/2024   PLT 187 01/20/2024   Last metabolic panel Lab Results  Component Value Date   GLUCOSE 90 01/20/2024   NA 141 01/20/2024   K 4.6 01/20/2024   CL 107 01/20/2024   CO2 25 01/20/2024   BUN 14 01/20/2024   CREATININE 0.63 01/20/2024   GFRNONAA >60 01/20/2024   CALCIUM 8.5 (L) 01/20/2024   PHOS 4.6 (H) 11/14/2021   PROT 5.1 (L) 10/18/2023   ALBUMIN 3.5 (L) 10/18/2023   LABGLOB 1.6 10/18/2023   BILITOT 0.4 10/18/2023   ALKPHOS 83 10/18/2023   AST 17 10/18/2023   ALT 20 10/18/2023   ANIONGAP 9 01/20/2024   Last lipids Lab Results  Component Value Date   CHOL 191 11/14/2021   HDL 79 11/14/2021   LDLCALC 99 11/14/2021   TRIG 71 11/14/2021   Last hemoglobin A1c Lab Results  Component Value Date   HGBA1C 6.0 (H) 09/22/2022      The 10-year ASCVD risk score (Arnett DK, et al., 2019) is: 0.3%    Assessment & Plan:  OSA on CPAP Assessment & Plan: Has not been using CPAP machine due to lack of snoring due to losing weight from bariatric surgery. Encouraged her to resume CPAP. Will refer for repeat sleep study.   Orders: -     Pulmonary Visit  Bilateral leg edema Assessment & Plan: Weight is up ~8 lbs since last visit. She is eating more protein. Not wearing compression stockings in office today although she reports wearing them frequently at home. Discussed elevating feet and consistently using compression. Will start furosemide 20 mg daily and follow up in 2 weeks. Encouraged her follow up with bariatrics regarding frequent diarrhea and poor absorption.    Other orders -     Furosemide; Take 1 tablet (20 mg total) by mouth daily.  Dispense: 30 tablet; Refill: 1     Return in about 2 weeks (around 02/25/2024) for Venous insuffciency.    Harlene Saddler,  MD

## 2024-02-14 NOTE — Assessment & Plan Note (Signed)
 Weight is up ~8 lbs since last visit. She is eating more protein. Not wearing compression stockings in office today although she reports wearing them frequently at home. Discussed elevating feet and consistently using compression. Will start furosemide 20 mg daily and follow up in 2 weeks. Encouraged her follow up with bariatrics regarding frequent diarrhea and poor absorption.

## 2024-02-19 ENCOUNTER — Other Ambulatory Visit: Payer: Self-pay | Admitting: Psychiatry

## 2024-02-22 ENCOUNTER — Ambulatory Visit: Admitting: Sleep Medicine

## 2024-02-22 ENCOUNTER — Encounter: Payer: Self-pay | Admitting: Sleep Medicine

## 2024-02-22 VITALS — BP 120/80 | HR 73 | Temp 97.8°F | Ht 65.0 in | Wt 186.8 lb

## 2024-02-22 DIAGNOSIS — F5104 Psychophysiologic insomnia: Secondary | ICD-10-CM

## 2024-02-22 DIAGNOSIS — G4733 Obstructive sleep apnea (adult) (pediatric): Secondary | ICD-10-CM

## 2024-02-22 NOTE — Patient Instructions (Signed)

## 2024-02-22 NOTE — Progress Notes (Signed)
 Name:Margaret Cuevas MRN: [id number] DOB: 1976-03-07   CHIEF COMPLAINT:  ESTABLISH CARE FOR OSA   HISTORY OF PRESENT ILLNESS: [name] is a 48 y.o. w/ a h/o OSA, obesity s/p gastric bypass, chronic back pain, anxiety and depression who presents to establish care for OSA. Reports that she was initially diagnosed with OSA several years ago and was subsequently started on CPAP therapy. Reports using CPAP therapy for several years and then after undergoing gastric bypass states that she discontinued CPAP therapy. Reports recently starting back on CPAP therapy. She is currently using the Airtouch F20 FFM, which is comfortable.   Reports nocturnal awakenings due to unclear reasons, however does not have difficulty falling back to sleep. Denies any significant weight changes. Admits to night sweats and dry mouth. Denies morning headaches, RLS symptoms, dream enactment, cataplexy, hypnagogic or hypnapompic hallucinations. Reports a family history of sleep apnea. Denies drowsy driving. Drinks 1-2 cups of coffee daily, denies alcohol, tobacco or illicit drug use.   Bedtime 9-11 pm Sleep onset 30-60 mins Rise time 5 am   EPWORTH SLEEP SCORE 9    02/22/2024    8:00 AM  Results of the Epworth flowsheet  Sitting and reading 2  Watching TV 2  Sitting, inactive in a public place (e.g. a theatre or a meeting) 1  As a passenger in a car for an hour without a break 0  Lying down to rest in the afternoon when circumstances permit 3  Sitting and talking to someone 0  Sitting quietly after a lunch without alcohol 1  In a car, while stopped for a few minutes in traffic 0  Total score 9    PAST MEDICAL HISTORY :   has a past medical history of Anemia, Anginal pain, Anxiety, Arthritis, Asthma, Chest pain, Chronic pain syndrome, Complication of anesthesia, Failed back surgical syndrome, Fibromyalgia, GERD (gastroesophageal reflux disease), Hiatal hernia, History of Roux-en-Y gastric bypass (2005),  Lipoma of back, Lumbar disc herniation, Lumbar radiculopathy, Obesity, OSA on CPAP, and Pneumonia.  has a past surgical history that includes Abdominal hysterectomy; Gastric bypass (2006); ORIF finger / thumb fracture; Back surgery; Ankle fracture surgery; Cholecystectomy; Ear tube removal; Cesarean section; Appendectomy; Colonoscopy (N/A, 05/04/2022); Esophagogastroduodenoscopy (N/A, 05/04/2022); Right RIGHT L4-5 FAR LATERAL DISCECTOMY (08/07/2022); Anterior lateral lumbar fusion with percutaneous screw 1 level (N/A, 11/09/2022); Application of intraoperative CT scan (N/A, 11/09/2022); and Thoracic laminectomy for spinal cord stimulator (N/A, 04/16/2023). Prior to Admission medications   Medication Sig Start Date End Date Taking? Authorizing Provider  acetaminophen  (TYLENOL ) 650 MG CR tablet Take 1,300 mg by mouth every 8 (eight) hours as needed for pain.    [provider]  buprenorphine (SUBUTEX) 8 MG SUBL SL tablet in the morning and at bedtime. 11/01/23   [provider]  busPIRone  (BUSPAR ) 7.5 MG tablet Take 1 tablet (7.5 mg total) by mouth 2 (two) times daily. 12/30/23 03/29/24  Vickey Mettle, MD  cyclobenzaprine  (FLEXERIL ) 10 MG tablet TAKE 1 TABLET(10 MG) BY MOUTH THREE TIMES DAILY AS NEEDED FOR MUSCLE SPASMS 02/09/23   Gregory Edsel Ruth, PA  DULoxetine  (CYMBALTA ) 60 MG capsule Take 2 capsules (120 mg total) by mouth daily. 02/19/24 08/17/24  Vickey Mettle, MD  furosemide (LASIX) 20 MG tablet Take 1 tablet (20 mg total) by mouth daily. 02/11/24   Lemon Raisin, MD  gabapentin  (NEURONTIN ) 600 MG tablet Take 2 tablets (1,200 mg total) by mouth 3 (three) times daily. 08/24/23 02/11/24  Hilma Hastings, PA-C  Multiple  Vitamins-Minerals (CITRACAL +D3 PO) Take 1,200 mg by mouth 2 (two) times daily.    [provider]   Allergies  Allergen Reactions   Ibuprofen Other (See Comments)    Motrin makes her throat swell. Can take ibuprofen    FAMILY HISTORY:  family history includes  Breast cancer (age of onset: 29) in her cousin; Breast cancer (age of onset: 9) in her mother; Cancer in her maternal grandmother and paternal grandfather; Diabetes in her maternal grandmother and mother; Drug abuse in her sister; Hypertension in her father, maternal grandmother, and paternal grandmother. SOCIAL HISTORY:  reports that she has quit smoking. Her smoking use included cigarettes. She has been exposed to tobacco smoke. She has never used smokeless tobacco. She reports current alcohol use. She reports that she does not use drugs.   Review of Systems:  Gen:  Denies  fever, sweats, chills weight loss  HEENT: Denies blurred vision, double vision, ear pain, eye pain, hearing loss, nose bleeds, sore throat Cardiac:  No dizziness, chest pain or heaviness, chest tightness,edema, No JVD Resp:   No cough, -sputum production, -shortness of breath,-wheezing, -hemoptysis,  Gi: Denies swallowing difficulty, stomach pain, nausea or vomiting, diarrhea, constipation, bowel incontinence Gu:  Denies bladder incontinence, burning urine Ext:   Denies Joint pain, stiffness or swelling Skin: Denies  skin rash, easy bruising or bleeding or hives Endoc:  Denies polyuria, polydipsia , polyphagia or weight change Psych:   Denies depression, insomnia or hallucinations  Other:  All other systems negative  VITAL SIGNS: BP 120/80   Pulse 73   Temp 97.8 F (36.6 C)   Ht 5' 5 (1.651 m)   Wt 186 lb 12.8 oz (84.7 kg)   SpO2 97%   BMI 31.09 kg/m    Physical Examination:   General Appearance: No distress  EYES PERRLA, EOM intact.   NECK Supple, No JVD Pulmonary: normal breath sounds, No wheezing.  CardiovascularNormal S1,S2.  No m/r/g.   Abdomen: Benign, Soft, non-tender. Skin:   warm, no rashes, no ecchymosis  Extremities: normal, no cyanosis, clubbing. Neuro:without focal findings,  speech normal  PSYCHIATRIC: Mood, affect within normal limits.   ASSESSMENT AND PLAN  OSA suspect that OSA  is likely present due to clinical presentation. Discussed the consequences of untreated sleep apnea. Advised not to drive drowsy for safety of patient and others. Will complete further evaluation with a home sleep study and follow up to review results.    Insomnia Counseled patient on stimulus control and improving sleep hygiene practices.    Patient  satisfied with Plan of action and management. All questions answered  I spent a total of 46 minutes reviewing chart data, face-to-face evaluation with the patient, counseling and coordination of care as detailed above.    Lilyian Quayle, M.D.  Sleep Medicine [facility]

## 2024-02-25 ENCOUNTER — Ambulatory Visit: Admitting: Student

## 2024-02-25 ENCOUNTER — Encounter: Payer: Self-pay | Admitting: Student

## 2024-02-25 VITALS — BP 112/74 | HR 103 | Ht 65.0 in | Wt 182.0 lb

## 2024-02-25 DIAGNOSIS — R600 Localized edema: Secondary | ICD-10-CM | POA: Diagnosis not present

## 2024-02-25 DIAGNOSIS — K909 Intestinal malabsorption, unspecified: Secondary | ICD-10-CM | POA: Diagnosis not present

## 2024-02-25 DIAGNOSIS — E43 Unspecified severe protein-calorie malnutrition: Secondary | ICD-10-CM | POA: Diagnosis not present

## 2024-02-25 DIAGNOSIS — D508 Other iron deficiency anemias: Secondary | ICD-10-CM | POA: Diagnosis not present

## 2024-02-25 DIAGNOSIS — K912 Postsurgical malabsorption, not elsewhere classified: Secondary | ICD-10-CM | POA: Diagnosis not present

## 2024-02-25 DIAGNOSIS — R197 Diarrhea, unspecified: Secondary | ICD-10-CM | POA: Diagnosis not present

## 2024-02-25 DIAGNOSIS — Z23 Encounter for immunization: Secondary | ICD-10-CM | POA: Diagnosis not present

## 2024-02-25 MED ORDER — FUROSEMIDE 20 MG PO TABS: 20.0000 mg | ORAL_TABLET | Freq: Every day | ORAL | 0 refills | Status: AC

## 2024-02-25 NOTE — Assessment & Plan Note (Addendum)
 Has been on iron supplementation since June, CBC is stable.

## 2024-03-01 NOTE — Progress Notes (Signed)
 Established Patient Office Visit  Subjective   Patient ID: Margaret Cuevas, female    DOB: Jul 20, 1975  Age: 48 y.o. MRN: [id number]  No chief complaint on file.   Margaret Cuevas with medical hx listed below presents today for follow up of lower extremity swelling. This is improved from last visit. Is taking lasix. Still having significant diarrhea after meals. Has follow up with bariatric surgery regarding this.   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   Chronic pain syndrome 02/11/2023   S/P lumbar fusion 11/09/2022   Recurrent herniation of lumbar disc 11/09/2022   Spinal stenosis of lumbar region without neurogenic claudication 11/09/2022   Chronic radicular lumbar pain 08/07/2022   OSA on CPAP 05/07/2021   Anxiety, generalized 05/07/2021   H/O total hysterectomy 07/15/2020   History of appendectomy 07/15/2020   Fibromyalgia 10/25/2017      ROS Refer to HPI    Objective:     Outpatient Encounter Medications as of 02/25/2024  Medication Sig   acetaminophen  (TYLENOL ) 650 MG CR tablet Take 1,300 mg by mouth every 8 (eight) hours as needed for pain.   buprenorphine (SUBUTEX) 8 MG SUBL SL tablet in the morning and at bedtime.   busPIRone  (BUSPAR ) 7.5 MG tablet Take 1 tablet (7.5 mg total) by mouth 2 (two) times daily.   cyclobenzaprine  (FLEXERIL ) 10 MG tablet TAKE 1 TABLET(10 MG) BY MOUTH THREE TIMES DAILY AS NEEDED FOR MUSCLE SPASMS   DULoxetine  (CYMBALTA ) 60 MG capsule Take 2 capsules (120 mg total) by mouth daily.   gabapentin  (NEURONTIN ) 600 MG tablet Take 2 tablets (1,200  mg total) by mouth 3 (three) times daily.   Multiple Vitamins-Minerals (CITRACAL +D3 PO) Take 1,200 mg by mouth 2 (two) times daily.   [DISCONTINUED] furosemide (LASIX) 20 MG tablet Take 1 tablet (20 mg total) by mouth daily.   furosemide (LASIX) 20 MG tablet Take 1 tablet (20 mg total) by mouth daily.   No facility-administered encounter medications on file as of 02/25/2024.    BP 112/74   Pulse (!) 103   Ht 5' 5 (1.651 m)   Wt 182 lb (82.6 kg)   SpO2 96%   BMI 30.29 kg/m  BP Readings from Last 3 Encounters:  02/25/24 112/74  02/22/24 120/80  02/11/24 112/78    Physical Exam Constitutional:      Appearance: Normal appearance.  HENT:     Mouth/Throat:     Mouth: Mucous membranes are moist.     Pharynx: Oropharynx is clear.  Cardiovascular:     Rate and Rhythm: Normal rate and regular rhythm.  Pulmonary:     Effort: Pulmonary effort is normal.     Breath sounds: No rhonchi or rales.  Abdominal:     General: Abdomen is flat. Bowel sounds are normal. There is no distension.     Palpations: Abdomen is soft.     Tenderness: There is no abdominal tenderness.  Musculoskeletal:        General: Normal range of motion.     Right lower leg: Edema (trace) present.     Left lower leg: Edema (1+)  present.  Skin:    General: Skin is warm and dry.     Capillary Refill: Capillary refill takes less than 2 seconds.  Neurological:     General: No focal deficit present.     Mental Status: She is alert and oriented to person, place, and time.  Psychiatric:        Mood and Affect: Mood normal.        Behavior: Behavior normal.        02/03/2024    8:21 AM 11/23/2023   10:31 AM 11/11/2023    1:42 PM  Depression screen PHQ 2/9  Decreased Interest  2   Down, Depressed, Hopeless  2   PHQ - 2 Score  4   Altered sleeping  2   Tired, decreased energy  3   Change in appetite  1   Feeling bad or failure about yourself   2   Trouble concentrating  2   Moving slowly or fidgety/restless   1   Suicidal thoughts  1   PHQ-9 Score  16   Difficult doing work/chores  Somewhat difficult      Information is confidential and restricted. Go to Review Flowsheets to unlock data.       02/03/2024    8:40 AM 11/23/2023   10:31 AM 11/11/2023    1:43 PM 10/18/2023    1:25 PM  GAD 7 : Generalized Anxiety Score  Nervous, Anxious, on Edge  2  1  Control/stop worrying  0  2  Worry too much - different things  2  2  Trouble relaxing  3  2  Restless  2  2  Easily annoyed or irritable  3  3  Afraid - awful might happen  1  3  Total GAD 7 Score  13  15  Anxiety Difficulty  Somewhat difficult  Somewhat difficult     Information is confidential and restricted. Go to Review Flowsheets to unlock data.    No results found for any visits on 02/25/24.  Last CBC Lab Results  Component Value Date   WBC 6.3 01/20/2024   HGB 10.1 (L) 01/20/2024   HCT 31.2 (L) 01/20/2024   MCV 101.6 (H) 01/20/2024   MCH 32.9 01/20/2024   RDW 13.3 01/20/2024   PLT 187 01/20/2024   Last metabolic panel Lab Results  Component Value Date   GLUCOSE 90 01/20/2024   NA 141 01/20/2024   K 4.6 01/20/2024   CL 107 01/20/2024   CO2 25 01/20/2024   BUN 14 01/20/2024   CREATININE 0.63 01/20/2024   GFRNONAA >60 01/20/2024   CALCIUM 8.5 (L) 01/20/2024   PHOS 4.6 (H) 11/14/2021   PROT 5.1 (L) 10/18/2023   ALBUMIN 3.5 (L) 10/18/2023   LABGLOB 1.6 10/18/2023   BILITOT 0.4 10/18/2023   ALKPHOS 83 10/18/2023   AST 17 10/18/2023   ALT 20 10/18/2023   ANIONGAP 9 01/20/2024      The 10-year ASCVD risk score (Arnett DK, et al., 2019) is: 0.3%    Assessment & Plan:  Iron deficiency anemia secondary to inadequate dietary iron intake Assessment & Plan: Has been on iron supplementation since June, CBC is stable.    Encounter for immunization -     Flu vaccine trivalent PF, 6mos and older(Flulaval,Afluria,Fluarix,Fluzone)  Bilateral leg edema Assessment & Plan: Improved on lasix 20 mg daily, weight is down  182 lbs. Still having significant diarrhea due to bariatric procedure, working on this with bariatric surgery which may improved  edema. Tolerating lasix well, normotensive today. CMP from 10/31 with stable K and kidney function.    Other orders -     Furosemide; Take 1 tablet (20 mg total) by mouth daily.  Dispense: 90 tablet; Refill: 0     No follow-ups on file.    Harlene Saddler, MD

## 2024-03-01 NOTE — Assessment & Plan Note (Signed)
 Improved on lasix 20 mg daily, weight is down 182 lbs. Still having significant diarrhea due to bariatric procedure, working on this with bariatric surgery which may improved edema. Tolerating lasix well, normotensive today. CMP from 10/31 with stable K and kidney function.

## 2024-03-06 DIAGNOSIS — G894 Chronic pain syndrome: Secondary | ICD-10-CM | POA: Diagnosis not present

## 2024-03-06 DIAGNOSIS — M5117 Intervertebral disc disorders with radiculopathy, lumbosacral region: Secondary | ICD-10-CM | POA: Diagnosis not present

## 2024-03-06 DIAGNOSIS — M791 Myalgia, unspecified site: Secondary | ICD-10-CM | POA: Diagnosis not present

## 2024-03-06 DIAGNOSIS — G8921 Chronic pain due to trauma: Secondary | ICD-10-CM | POA: Diagnosis not present

## 2024-03-09 NOTE — Progress Notes (Unsigned)
 THERAPIST PROGRESS NOTE  Virtual Visit via Video Note  I connected with Margaret Cuevas on 03/09/24 at 11:00 AM EST by a video enabled telemedicine application and verified that I am speaking with the correct person using two identifiers.  Location: Patient: Address on file Provider: Providers address   I discussed the limitations of evaluation and management by telemedicine and the availability of in person appointments. The patient expressed understanding and agreed to proceed.      I discussed the assessment and treatment plan with the patient. The patient was provided an opportunity to ask questions and all were answered. The patient agreed with the plan and demonstrated an understanding of the instructions.   The patient was advised to call back or seek an in-person evaluation if the symptoms worsen or if the condition fails to improve as anticipated.  I provided *** minutes of non-face-to-face time during this encounter.   Margaret Cuevas   Session Time: ***  Participation Level: Active  Behavioral Response: CasualAlertEuthymic  Type of Therapy: Individual Therapy  Treatment Goals addressed:  Active     BH CCP Acute or Chronic Trauma Reaction     LTG: Elimination of maladaptive behaviors and thinking patterns which interfere with resolution of trauma as evidenced by self report (Not Progressing)     Start:  09/28/23    Expected End:  05/05/24         LTG: Develop and implement effective coping skills to carry out normal responsibilities and participate constructively in relationships as evidenced by self report (Not Progressing)     Start:  09/28/23    Expected End:  05/05/24         LTG: Recall traumatic events without becoming overwhelmed with negative emotions (Not Progressing)     Start:  09/28/23    Expected End:  05/05/24         STG: Margaret Cuevas will identify internal and external stimuli that trigger PTSD symptoms (Not Progressing)      Start:  09/28/23    Expected End:  05/05/24         [name]      Start:  09/28/23         Teach Margaret Cuevas coping strategies (e.g., writing down thoughts and feelings in a journal; taking deep, slow breaths; calling a support person to talk about memories) to deal with trauma memories and sudden emotional reactions without becoming emotionally n     Start:  09/28/23           OP Depression     LTG: Reduce frequency, intensity, and duration of depression symptoms so that daily functioning is improved (Progressing)     Start:  07/27/23    Expected End:  05/05/24       Goal Note     02/03/24: Patient reports she is making improvements with her depressive sxs AEB by her PHQ9 scores.          LTG: Increase coping skills to manage depression and improve ability to perform daily activities (Progressing)     Start:  07/27/23    Expected End:  05/05/24         STG: Margaret Cuevas will identify cognitive patterns and beliefs that support depression (Progressing)     Start:  07/27/23    Expected End:  05/05/24       Goal Note     02/03/24: Reports she has been able  to catch and reframe negative thoughts.          STG: I have to figure out a way to not put so much pressure on myself to avoid feeling this sense of worthlessness. (Not Progressing)     Start:  07/27/23    Expected End:  05/05/24       Goal Note     02/03/24: Patient continues to report she is hard on herself for not completing tasks for the days. Cln encouraged the patient to focus on what she has accomplished for the day.           Work with Margaret Cuevas to identify the major components of a recent episode of depression: physical symptoms, major thoughts and images, and major behaviors they experienced     Start:  07/27/23         Therapist will educate patient on cognitive distortions and the rationale for treatment of  depression     Start:  07/27/23         Margaret Cuevas will identify 2 cognitive distortions they are currently using and write reframing statements to replace them     Start:  07/27/23         Coping Skills     Start:  07/27/23       Will work with the pt using CBT/DBT techniques to help the pt verbalize an understanding of the cognitive, physiological, and behavioral components of depression and its treatment. This will be done by using worksheets, interactive activities, CBT/ABC thought logs, modeling, homework, role playing and journaling. Will work with pt to learn and implement coping skills that result in a reduction of depression and improve daily functioning per pt self-report 3 out of 5 documented sessions.          ProgressTowards Goals: Progressing  Interventions: {CHL AMB BH Type of Intervention:[id number]}  Summary:  Margaret Cuevas is a 48 y.o. female who presents with sxs of anxiety ad depression. Identifies sxs to include but not limited to Change in energy/activity; Difficulty Concentrating; Fatigue; Hopelessness; Increase/decrease in appetite; Irritability; Sleep (too much); Weight gain/loss; Worthlessness; Difficulty concentrating; Fatigue; Irritability; Restlessness; Sleep; Tension; Worrying. Pt was oriented times 5. Pt was cooperative and engaged. Pt denies SI/HI/AVH.   Suicidal/Homicidal: Nowithout intent/plan  Therapist Response: Clinician utilized active and supportive reflection to create a safe space for patient to process recent life events. Assessed for safety, stressors, symptoms since last session.    Plan: Return again in 2 weeks.  Diagnosis: MDD (major depressive disorder), recurrent episode, mild  Anxiety disorder, unspecified type   Collaboration of Care: AEB psychiatrist can access notes and cln. Will review psychiatrists' notes. Check in with the patient and will see Cuevas per availability. Patient agreed with treatment recommendations.    Patient/Guardian was advised Release of Information must be obtained prior to any record release in order to collaborate their care with an outside provider. Patient/Guardian was advised if they have not already done so to contact the registration department to sign all necessary forms in order for us  to release information regarding their care.   Consent: Patient/Guardian gives verbal consent for treatment and assignment of benefits for services provided during this visit. Patient/Guardian expressed understanding and agreed to proceed.   Margaret Cuevas 03/09/2024

## 2024-03-10 ENCOUNTER — Ambulatory Visit (INDEPENDENT_AMBULATORY_CARE_PROVIDER_SITE_OTHER): Admitting: Licensed Clinical Social Worker

## 2024-03-10 DIAGNOSIS — F419 Anxiety disorder, unspecified: Secondary | ICD-10-CM

## 2024-03-10 DIAGNOSIS — Z91199 Patient's noncompliance with other medical treatment and regimen due to unspecified reason: Secondary | ICD-10-CM

## 2024-03-10 DIAGNOSIS — F330 Major depressive disorder, recurrent, mild: Secondary | ICD-10-CM

## 2024-03-14 ENCOUNTER — Encounter: Payer: Self-pay | Admitting: Licensed Clinical Social Worker
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
## 2024-03-27 DIAGNOSIS — G8921 Chronic pain due to trauma: Secondary | ICD-10-CM | POA: Diagnosis not present

## 2024-03-27 DIAGNOSIS — M5117 Intervertebral disc disorders with radiculopathy, lumbosacral region: Secondary | ICD-10-CM | POA: Diagnosis not present

## 2024-03-27 DIAGNOSIS — M791 Myalgia, unspecified site: Secondary | ICD-10-CM | POA: Diagnosis not present

## 2024-03-27 DIAGNOSIS — G894 Chronic pain syndrome: Secondary | ICD-10-CM | POA: Diagnosis not present

## 2024-03-30 ENCOUNTER — Ambulatory Visit (INDEPENDENT_AMBULATORY_CARE_PROVIDER_SITE_OTHER): Admitting: Psychiatry

## 2024-03-30 ENCOUNTER — Other Ambulatory Visit: Payer: Self-pay

## 2024-03-30 ENCOUNTER — Encounter: Payer: Self-pay | Admitting: Psychiatry

## 2024-03-30 VITALS — BP 115/80 | HR 75 | Temp 97.3°F | Ht 65.0 in | Wt 180.2 lb

## 2024-03-30 DIAGNOSIS — F330 Major depressive disorder, recurrent, mild: Secondary | ICD-10-CM | POA: Diagnosis not present

## 2024-03-30 DIAGNOSIS — F419 Anxiety disorder, unspecified: Secondary | ICD-10-CM

## 2024-03-30 NOTE — Patient Instructions (Signed)
 Continue duloxetine  120 mg daily Continue BuSpar  7.5 mg twice a day Next appointment: 1/26 at 4:30

## 2024-04-07 DIAGNOSIS — Z9884 Bariatric surgery status: Secondary | ICD-10-CM | POA: Diagnosis not present

## 2024-04-07 DIAGNOSIS — R600 Localized edema: Secondary | ICD-10-CM | POA: Diagnosis not present

## 2024-04-07 DIAGNOSIS — E43 Unspecified severe protein-calorie malnutrition: Secondary | ICD-10-CM | POA: Diagnosis not present

## 2024-04-07 DIAGNOSIS — K912 Postsurgical malabsorption, not elsewhere classified: Secondary | ICD-10-CM | POA: Diagnosis not present

## 2024-04-10 ENCOUNTER — Ambulatory Visit (INDEPENDENT_AMBULATORY_CARE_PROVIDER_SITE_OTHER): Admitting: Licensed Clinical Social Worker

## 2024-04-10 ENCOUNTER — Encounter: Payer: Self-pay | Admitting: Licensed Clinical Social Worker

## 2024-04-10 DIAGNOSIS — F330 Major depressive disorder, recurrent, mild: Secondary | ICD-10-CM | POA: Diagnosis not present

## 2024-04-10 DIAGNOSIS — F419 Anxiety disorder, unspecified: Secondary | ICD-10-CM

## 2024-04-10 NOTE — Progress Notes (Signed)
 THERAPIST PROGRESS NOTE  Virtual Visit via Video Note  I connected with Margaret Cuevas on 04/10/2024 at  9:00 AM EST by a video enabled telemedicine application and verified that I am speaking with the correct person using two identifiers.  Location: Patient: Address on file  Provider: Providers address   I discussed the limitations of evaluation and management by telemedicine and the availability of in person appointments. The patient expressed understanding and agreed to proceed.  I discussed the assessment and treatment plan with the patient. The patient was provided an opportunity to ask questions and all were answered. The patient agreed with the plan and demonstrated an understanding of the instructions.   The patient was advised to call back or seek an in-person evaluation if the symptoms worsen or if the condition fails to improve as anticipated.  I provided 47 minutes of non-face-to-face time during this encounter.   Margaret KATHEE Husband, LCSW   Session Time: 9:12am-9:59am  Participation Level: Active  Behavioral Response: DisheveledDrowsyEuthymic  Type of Therapy: Individual Therapy  Treatment Goals addressed:  Active     BH CCP Acute or Chronic Trauma Reaction     LTG: Elimination of maladaptive behaviors and thinking patterns which interfere with resolution of trauma as evidenced by self report (Not Progressing)     Start:  09/28/23    Expected End:  05/05/24         LTG: Develop and implement effective coping skills to carry out normal responsibilities and participate constructively in relationships as evidenced by self report (Not Progressing)     Start:  09/28/23    Expected End:  05/05/24         LTG: Recall traumatic events without becoming overwhelmed with negative emotions (Not Progressing)     Start:  09/28/23    Expected End:  05/05/24         STG: [name] will identify internal and external stimuli that trigger PTSD symptoms (Not Progressing)      Start:  09/28/23    Expected End:  05/05/24         [name]      Start:  09/28/23         Teach [name] coping strategies (e.g., writing down thoughts and feelings in a journal; taking deep, slow breaths; calling a support person to talk about memories) to deal with trauma memories and sudden emotional reactions without becoming emotionally n     Start:  09/28/23           OP Depression     LTG: Reduce frequency, intensity, and duration of depression symptoms so that daily functioning is improved (Progressing)     Start:  07/27/23    Expected End:  05/05/24       Goal Note     02/03/24: Patient reports she is making improvements with her depressive sxs AEB by her PHQ9 scores.          LTG: Increase coping skills to manage depression and improve ability to perform daily activities (Progressing)     Start:  07/27/23    Expected End:  05/05/24         STG: [name] will identify cognitive patterns and beliefs that support depression (Progressing)     Start:  07/27/23    Expected End:  05/05/24       Goal Note     02/03/24: Reports she has been able to catch  and reframe negative thoughts.          STG: I have to figure out a way to not put so much pressure on myself to avoid feeling this sense of worthlessness. (Not Progressing)     Start:  07/27/23    Expected End:  05/05/24       Goal Note     02/03/24: Patient continues to report she is hard on herself for not completing tasks for the days. Cln encouraged the patient to focus on what she has accomplished for the day.           Work with [name] to identify the major components of a recent episode of depression: physical symptoms, major thoughts and images, and major behaviors they experienced     Start:  07/27/23         Therapist will educate patient on cognitive distortions and the rationale for treatment  of depression     Start:  07/27/23         [name] will identify 2 cognitive distortions they are currently using and write reframing statements to replace them     Start:  07/27/23         Coping Skills     Start:  07/27/23       Will work with the pt using CBT/DBT techniques to help the pt verbalize an understanding of the cognitive, physiological, and behavioral components of depression and its treatment. This will be done by using worksheets, interactive activities, CBT/ABC thought logs, modeling, homework, role playing and journaling. Will work with pt to learn and implement coping skills that result in a reduction of depression and improve daily functioning per pt self-report 3 out of 5 documented sessions.          ProgressTowards Goals: Progressing  Interventions: Solution Focused, Assertiveness Training, and Supportive  Summary: Margaret Cuevas is a 48 y.o. female who presents with sxs of anxiety ad depression. Identifies sxs to include but not limited to Change in energy/activity; Difficulty Concentrating; Fatigue; Hopelessness; Increase/decrease in appetite; Irritability; Sleep (too much); Weight gain/loss; Worthlessness; Difficulty concentrating; Fatigue; Irritability; Restlessness; Sleep; Tension; Worrying. Pt was oriented times 5. Pt was cooperative and engaged. Pt denies SI/HI/AVH.    Cln addressed attendance issues related to a no-show in the previous session. The patient reports that she slept through her alarm, which caused her to be late for todays session.  The patient presented with a significant amount of discomfort and pain.   She reports that her physical health has declined. She states she was previously working out on a daily basis and was hopeful about being able to discontinue her medications and return to work, but [employer]. She is working towards accepting that she may not be able to engage in the same level of physical activity as she did  before her back injury. She identifies that she remains active in her church.  The patient expresses frustration with the uncertainty surrounding her health condition. She reports feeling overwhelmed about being prescribed a feeding tube due to malnutrition.   Her anxiety and depression have been manageable. She shares that she has experienced an improvement in her emotional breakdowns, citing only 3-4 episodes in the past two months. These breakdowns are characterized by uncontrollable crying, panic attacks, and feelings of hopelessness.   The patient shares that she is looking forward to the upcoming holidays.   She reflects on the toll that her health has taken on her personal relationships and notes  her use of assertive communication in an effort to improve these relationships. She realizes that she needs more affection and reports that she does not feel like herself, which is impacting her expressions of love. She shares that she feels invisible in her relationships.  For homework, the patient will complete love language quizzes with her spouse.  Suicidal/Homicidal: Nowithout intent/plan  Therapist Response: Clinician utilized active and supportive reflection to create a safe space for patient to process recent life events. Assessed for safety, stressors, symptoms since last session.  Clinician worked with patient to challenge ways in which she can utilize assertive communication to establish a mutual understanding of feelings within her relationship and acknowledging efforts made to improve connectivity.  Engaged and problem solving/solutions to improve connection within her romantic relationships.  Plan: Return again in 4 weeks.  Diagnosis: MDD (major depressive disorder), recurrent episode, mild  Anxiety disorder, unspecified type   Collaboration of Care: AEB psychiatrist can access notes and cln. Will review psychiatrists' notes. Check in with the patient and will see LCSW per  availability. Patient agreed with treatment recommendations.   Patient/Guardian was advised Release of Information must be obtained prior to any record release in order to collaborate their care with an outside provider. Patient/Guardian was advised if they have not already done so to contact the registration department to sign all necessary forms in order for us  to release information regarding their care.   Consent: Patient/Guardian gives verbal consent for treatment and assignment of benefits for services provided during this visit. Patient/Guardian expressed understanding and agreed to proceed.   Margaret KATHEE Husband, LCSW 04/10/2024

## 2024-05-12 ENCOUNTER — Ambulatory Visit (INDEPENDENT_AMBULATORY_CARE_PROVIDER_SITE_OTHER): Admitting: Licensed Clinical Social Worker

## 2024-05-12 DIAGNOSIS — Z91199 Patient's noncompliance with other medical treatment and regimen due to unspecified reason: Secondary | ICD-10-CM

## 2024-05-12 NOTE — Progress Notes (Signed)
" °  Clinician attempted session via telehealth, but Margaret Cuevas did not join. Cln sent the patient a text message during the appointment with the link for the visit as an additional reminder. Cln waited 15 minutes after appointment for start but patient never appeared. Per [employer], s/he will be charged a no-show fee.  "

## 2024-05-15 ENCOUNTER — Ambulatory Visit (INDEPENDENT_AMBULATORY_CARE_PROVIDER_SITE_OTHER): Admitting: Licensed Clinical Social Worker

## 2024-05-15 ENCOUNTER — Encounter: Payer: Self-pay | Admitting: Licensed Clinical Social Worker

## 2024-05-15 DIAGNOSIS — Z91199 Patient's noncompliance with other medical treatment and regimen due to unspecified reason: Secondary | ICD-10-CM
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
## 2024-05-15 NOTE — Progress Notes (Signed)
" °  Clinician attempted session via telehealth, but [name] did not join. Cln waited 15 minutes after appointment for start but patient never appeared. Per [facility] policy, s/he will be charged a no-show fee and discharged as this is her second no show in a row.   "

## 2024-05-22 ENCOUNTER — Encounter: Payer: Self-pay | Admitting: Psychiatry

## 2024-05-22 ENCOUNTER — Telehealth: Admitting: Psychiatry

## 2024-05-22 DIAGNOSIS — F331 Major depressive disorder, recurrent, moderate: Secondary | ICD-10-CM | POA: Diagnosis not present

## 2024-05-22 DIAGNOSIS — F419 Anxiety disorder, unspecified: Secondary | ICD-10-CM | POA: Diagnosis not present

## 2024-05-22 MED ORDER — BUSPIRONE HCL 7.5 MG PO TABS: 7.5000 mg | ORAL_TABLET | Freq: Two times a day (BID) | ORAL | 0 refills | Status: AC

## 2024-05-22 NOTE — Patient Instructions (Signed)
 Continue duloxetine  120 mg daily Continue BuSpar  7.5 mg twice a day Next appointment: 3/12 at 3:30

## 2024-05-24 ENCOUNTER — Ambulatory Visit: Admitting: Sleep Medicine

## 2024-05-26 ENCOUNTER — Ambulatory Visit: Admitting: Student

## 2024-05-26 ENCOUNTER — Encounter: Payer: Self-pay | Admitting: Student

## 2024-05-26 VITALS — BP 105/70 | HR 73 | Temp 97.9°F | Ht 65.0 in | Wt 174.1 lb

## 2024-05-26 DIAGNOSIS — M2550 Pain in unspecified joint: Secondary | ICD-10-CM

## 2024-05-26 DIAGNOSIS — R739 Hyperglycemia, unspecified: Secondary | ICD-10-CM

## 2024-05-26 DIAGNOSIS — Z23 Encounter for immunization: Secondary | ICD-10-CM

## 2024-05-26 DIAGNOSIS — R300 Dysuria: Secondary | ICD-10-CM

## 2024-05-26 DIAGNOSIS — G4733 Obstructive sleep apnea (adult) (pediatric): Secondary | ICD-10-CM

## 2024-05-26 NOTE — Assessment & Plan Note (Signed)
 Using CPAP more,

## 2024-05-26 NOTE — Progress Notes (Unsigned)
 "  Complete physical exam  Patient: Margaret Cuevas   DOB: 12/24/1975   48 y.o. Female  MRN: [id number]  Subjective:    Chief Complaint  Patient presents with   Annual Exam    Discuss recent labs from 1/13 - elevated CRP (C-reactive Protein, Inflammatory)     Ahava Kissoon is a 49 y.o. female who presents today for a complete physical exam. She reports consuming a bariatric diet. The patient does not participate in regular exercise at present. She generally feels fairly well. She reports sleeping fairly well. She does not have additional problems to discuss today.    {VISON DENTAL STD PSA (Optional):27386}  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   Chronic pain syndrome 02/11/2023   S/P lumbar fusion 11/09/2022   Recurrent herniation of lumbar disc 11/09/2022   Spinal stenosis of lumbar region without neurogenic claudication 11/09/2022   Chronic radicular lumbar pain 08/07/2022   OSA on CPAP 05/07/2021   Anxiety, generalized 05/07/2021   H/O total hysterectomy 07/15/2020   History of appendectomy 07/15/2020   Fibromyalgia 10/25/2017      Patient Care Team: Lemon Raisin, MD as PCP - General (Internal Medicine) Joshua Cathryne BROCKS, MD (Inactive) (Family Medicine)   Show/hide medication list[1]  ROS Refer to HPI     Objective:    BP 105/70   Pulse 73   Temp 97.9 F (36.6 C) (Oral)   Ht 5' 5 (1.651 m)   Wt 174 lb 2 oz (79 kg)   SpO2 97%   BMI 28.98 kg/m  BP Readings from Last 3 Encounters:  05/26/24 105/70  02/25/24 112/74  02/22/24 120/80    Physical Exam       03/30/2024    3:39 PM 02/03/2024    8:21 AM 11/23/2023   10:31 AM  Depression screen PHQ 2/9  Decreased Interest   2  Down, Depressed, Hopeless   2  PHQ - 2 Score   4  Altered sleeping   2  Tired, decreased energy   3  Change in appetite   1  Feeling bad or failure about yourself    2  Trouble concentrating   2  Moving slowly or fidgety/restless   1  Suicidal thoughts   1  PHQ-9 Score   16   Difficult doing work/chores   Somewhat difficult     Information is confidential and restricted. Go to Review Flowsheets to unlock data.   Data saved with a previous flowsheet row definition      03/30/2024    3:39 PM 02/03/2024    8:40 AM 11/23/2023   10:31 AM 11/11/2023    1:43 PM  GAD 7 : Generalized Anxiety Score  Nervous, Anxious, on Edge   2    Control/stop worrying   0    Worry too much - different things   2    Trouble relaxing   3    Restless   2    Easily annoyed or irritable   3    Afraid - awful might happen   1    Total GAD 7 Score  13   Anxiety Difficulty   Somewhat difficult      Information is confidential and restricted. Go to Review Flowsheets to unlock data.   Data saved with a previous flowsheet row definition    No results found for any visits on 05/26/24. {Show previous labs (optional):23779}    Assessment & Plan:    Routine Health Maintenance and Physical Exam  Health Maintenance  Topic Date Due   Pneumococcal Vaccine (1 of 2 - PCV) Never done   Hepatitis B Vaccine (1 of 3 - 19+ 3-dose series) Never done   Breast Cancer Screening  01/12/2026   Colon Cancer Screening  05/05/2027   Flu Shot  Completed   HPV Vaccine (No Doses Required) Completed   Meningitis B Vaccine  Aged Out   DTaP/Tdap/Td vaccine  Discontinued   COVID-19 Vaccine  Discontinued   Hepatitis C Screening  Discontinued   HIV Screening  Discontinued    Discussed health benefits of physical activity, and encouraged her to engage in regular exercise appropriate for her age and  condition.  There are no diagnoses linked to this encounter.  No follow-ups on file.     Harlene Saddler, MD    [1]  Outpatient Medications Prior to Visit  Medication Sig   acetaminophen  (TYLENOL ) 650 MG CR tablet Take 1,300 mg by mouth every 8 (eight) hours as needed for pain.   buprenorphine (SUBUTEX) 8 MG SUBL SL tablet in the morning and at bedtime.   busPIRone  (BUSPAR ) 7.5 MG tablet Take 1 tablet (7.5 mg total) by mouth 2 (two) times daily.   cyclobenzaprine  (FLEXERIL ) 10 MG tablet TAKE 1 TABLET(10 MG) BY MOUTH THREE TIMES DAILY AS NEEDED FOR MUSCLE SPASMS   DULoxetine  (CYMBALTA ) 60 MG capsule Take 2 capsules (120 mg total) by mouth daily.   ferrous sulfate 325 (65 FE) MG EC tablet Take 325 mg by mouth.   furosemide  (LASIX ) 20 MG tablet Take 1 tablet (20 mg total) by mouth daily.   gabapentin  (NEURONTIN ) 600 MG tablet Take 2 tablets (1,200 mg total) by mouth 3 (three) times daily.   magnesium  oxide (MAG-OX) 400 (240 Mg) MG tablet Take 1 tablet by mouth daily.   Menaquinone-7 (VITAMIN K2 PO) Take by mouth.   Multiple Vitamins-Minerals (CITRACAL +D3 PO) Take 1,200 mg by mouth 2 (two) times daily.   UNABLE TO FIND Take 1,000 mg by mouth as needed (prn). Med Name: OX BILE   No facility-administered medications prior to visit.   "

## 2024-05-27 LAB — URINALYSIS, ROUTINE W REFLEX MICROSCOPIC
Bilirubin, UA: NEGATIVE
Glucose, UA: NEGATIVE
Ketones, UA: NEGATIVE
Leukocytes,UA: NEGATIVE
Nitrite, UA: NEGATIVE
Protein,UA: NEGATIVE
RBC, UA: NEGATIVE
Specific Gravity, UA: 1.013 (ref 1.005–1.030)
Urobilinogen, Ur: 0.2 mg/dL (ref 0.2–1.0)
pH, UA: 6 (ref 5.0–7.5)

## 2024-05-29 ENCOUNTER — Ambulatory Visit: Admitting: Sleep Medicine

## 2024-05-30 ENCOUNTER — Ambulatory Visit: Payer: Self-pay | Admitting: Student

## 2024-05-30 ENCOUNTER — Encounter: Payer: Self-pay | Admitting: Student

## 2024-05-30 ENCOUNTER — Other Ambulatory Visit: Payer: Self-pay

## 2024-05-30 DIAGNOSIS — M2550 Pain in unspecified joint: Secondary | ICD-10-CM

## 2024-05-30 LAB — ENA+DNA/DS+ANTICH+CENTRO+FA...
Anti JO-1: <0.2 AI (ref 0.0–0.9)
Centromere Ab Screen: <0.2 AI (ref 0.0–0.9)
Chromatin Ab SerPl-aCnc: <0.2 AI (ref 0.0–0.9)
ENA RNP Ab: 0.2 AI (ref 0.0–0.9)
ENA SM Ab Ser-aCnc: <0.2 AI (ref 0.0–0.9)
ENA SSA (RO) Ab: <0.2 AI (ref 0.0–0.9)
ENA SSB (LA) Ab: <0.2 AI (ref 0.0–0.9)
Nucleolar Pattern: 1:160 {titer} — ABNORMAL HIGH
Scleroderma (Scl-70) (ENA) Antibody, IgG: <0.2 AI (ref 0.0–0.9)
dsDNA Ab: 1 [IU]/mL (ref 0–9)

## 2024-05-30 LAB — ANA: ANA Titer 1: POSITIVE — AB

## 2024-05-30 LAB — HEMOGLOBIN A1C
Est. average glucose Bld gHb Est-mCnc: 85 mg/dL
Hgb A1c MFr Bld: 4.6 % — ABNORMAL LOW (ref 4.8–5.6)

## 2024-05-30 LAB — RHEUMATOID FACTOR: Rheumatoid fact SerPl-aCnc: <10 [IU]/mL

## 2024-05-30 LAB — URINE CULTURE

## 2024-05-30 LAB — CYCLIC CITRUL PEPTIDE ANTIBODY, IGG/IGA: Cyclic Citrullin Peptide Ab: 7 U (ref 0–19)

## 2024-05-30 NOTE — Progress Notes (Signed)
 Spoke with patient. Verbalized understanding and would like referral sent to D. Patel with [facility].  JM

## 2024-06-07 ENCOUNTER — Ambulatory Visit: Admitting: Sleep Medicine

## 2024-07-06 ENCOUNTER — Ambulatory Visit: Admitting: Psychiatry

## 2024-11-24 ENCOUNTER — Ambulatory Visit: Admitting: Student
